# Patient Record
Sex: Male | Born: 1970
Health system: Southern US, Community
[De-identification: ages and names within clinical notes are randomized; demographics above are authoritative.]

## PROBLEM LIST (undated history)

## (undated) ENCOUNTER — Emergency Department (HOSPITAL_BASED_OUTPATIENT_CLINIC_OR_DEPARTMENT_OTHER): Admission: EM | Payer: Medicaid Other

## (undated) DIAGNOSIS — F419 Anxiety disorder, unspecified: Secondary | ICD-10-CM

## (undated) DIAGNOSIS — R911 Solitary pulmonary nodule: Secondary | ICD-10-CM

## (undated) DIAGNOSIS — N179 Acute kidney failure, unspecified: Secondary | ICD-10-CM

## (undated) DIAGNOSIS — J449 Chronic obstructive pulmonary disease, unspecified: Secondary | ICD-10-CM

## (undated) DIAGNOSIS — K209 Esophagitis, unspecified without bleeding: Secondary | ICD-10-CM

## (undated) DIAGNOSIS — I10 Essential (primary) hypertension: Secondary | ICD-10-CM

## (undated) DIAGNOSIS — G9389 Other specified disorders of brain: Secondary | ICD-10-CM

## (undated) DIAGNOSIS — S270XXA Traumatic pneumothorax, initial encounter: Secondary | ICD-10-CM

## (undated) DIAGNOSIS — C801 Malignant (primary) neoplasm, unspecified: Secondary | ICD-10-CM

## (undated) DIAGNOSIS — C7931 Secondary malignant neoplasm of brain: Secondary | ICD-10-CM

## (undated) DIAGNOSIS — C7951 Secondary malignant neoplasm of bone: Secondary | ICD-10-CM

## (undated) DIAGNOSIS — T148XXA Other injury of unspecified body region, initial encounter: Secondary | ICD-10-CM

## (undated) DIAGNOSIS — Z8051 Family history of malignant neoplasm of kidney: Secondary | ICD-10-CM

## (undated) DIAGNOSIS — Z808 Family history of malignant neoplasm of other organs or systems: Secondary | ICD-10-CM

## (undated) DIAGNOSIS — D649 Anemia, unspecified: Secondary | ICD-10-CM

## (undated) HISTORY — DX: Solitary pulmonary nodule: R91.1

## (undated) HISTORY — DX: Family history of malignant neoplasm of other organs or systems: Z80.8

## (undated) HISTORY — DX: Esophagitis, unspecified without bleeding: K20.90

## (undated) HISTORY — PX: SHOULDER SURGERY: SHX246

## (undated) HISTORY — DX: Malignant (primary) neoplasm, unspecified: C80.1

## (undated) HISTORY — DX: Family history of malignant neoplasm of kidney: Z80.51

## (undated) HISTORY — DX: Esophagitis, unspecified: K20.9

---

## 2016-05-15 DIAGNOSIS — Z8709 Personal history of other diseases of the respiratory system: Secondary | ICD-10-CM | POA: Insufficient documentation

## 2016-06-10 DIAGNOSIS — G9389 Other specified disorders of brain: Secondary | ICD-10-CM

## 2016-06-10 HISTORY — DX: Other specified disorders of brain: G93.89

## 2016-06-16 ENCOUNTER — Emergency Department (HOSPITAL_COMMUNITY): Payer: Medicaid Other

## 2016-06-16 ENCOUNTER — Encounter (HOSPITAL_COMMUNITY): Payer: Self-pay | Admitting: *Deleted

## 2016-06-16 ENCOUNTER — Inpatient Hospital Stay (HOSPITAL_COMMUNITY)
Admission: EM | Admit: 2016-06-16 | Discharge: 2016-06-22 | DRG: 166 | Disposition: A | Discharge status: Home / Self Care | Payer: Medicaid Other | Attending: Internal Medicine | Admitting: Internal Medicine

## 2016-06-16 DIAGNOSIS — R569 Unspecified convulsions: Secondary | ICD-10-CM

## 2016-06-16 DIAGNOSIS — W010XXA Fall on same level from slipping, tripping and stumbling without subsequent striking against object, initial encounter: Secondary | ICD-10-CM | POA: Diagnosis present

## 2016-06-16 DIAGNOSIS — F1210 Cannabis abuse, uncomplicated: Secondary | ICD-10-CM | POA: Diagnosis present

## 2016-06-16 DIAGNOSIS — F1490 Cocaine use, unspecified, uncomplicated: Secondary | ICD-10-CM | POA: Diagnosis present

## 2016-06-16 DIAGNOSIS — R911 Solitary pulmonary nodule: Principal | ICD-10-CM

## 2016-06-16 DIAGNOSIS — Z9889 Other specified postprocedural states: Secondary | ICD-10-CM

## 2016-06-16 DIAGNOSIS — R0602 Shortness of breath: Secondary | ICD-10-CM

## 2016-06-16 DIAGNOSIS — G9389 Other specified disorders of brain: Secondary | ICD-10-CM

## 2016-06-16 DIAGNOSIS — Z87891 Personal history of nicotine dependence: Secondary | ICD-10-CM

## 2016-06-16 DIAGNOSIS — R159 Full incontinence of feces: Secondary | ICD-10-CM | POA: Diagnosis present

## 2016-06-16 DIAGNOSIS — F1020 Alcohol dependence, uncomplicated: Secondary | ICD-10-CM | POA: Diagnosis present

## 2016-06-16 DIAGNOSIS — G936 Cerebral edema: Secondary | ICD-10-CM | POA: Diagnosis present

## 2016-06-16 DIAGNOSIS — Z8249 Family history of ischemic heart disease and other diseases of the circulatory system: Secondary | ICD-10-CM

## 2016-06-16 DIAGNOSIS — Z808 Family history of malignant neoplasm of other organs or systems: Secondary | ICD-10-CM

## 2016-06-16 DIAGNOSIS — D496 Neoplasm of unspecified behavior of brain: Secondary | ICD-10-CM

## 2016-06-16 DIAGNOSIS — Z419 Encounter for procedure for purposes other than remedying health state, unspecified: Secondary | ICD-10-CM

## 2016-06-16 HISTORY — DX: Other specified disorders of brain: G93.89

## 2016-06-16 HISTORY — DX: Solitary pulmonary nodule: R91.1

## 2016-06-16 HISTORY — DX: Traumatic pneumothorax, initial encounter: S27.0XXA

## 2016-06-16 HISTORY — DX: Other injury of unspecified body region, initial encounter: T14.8XXA

## 2016-06-16 LAB — RAPID URINE DRUG SCREEN, HOSP PERFORMED
Amphetamines: NOT DETECTED
BENZODIAZEPINES: NOT DETECTED
Barbiturates: NOT DETECTED
Cocaine: POSITIVE — AB
Opiates: NOT DETECTED
Tetrahydrocannabinol: POSITIVE — AB

## 2016-06-16 LAB — COMPREHENSIVE METABOLIC PANEL
ALT: 20 U/L (ref 17–63)
ANION GAP: 9 (ref 5–15)
AST: 48 U/L — ABNORMAL HIGH (ref 15–41)
Albumin: 4.3 g/dL (ref 3.5–5.0)
Alkaline Phosphatase: 49 U/L (ref 38–126)
BUN: 9 mg/dL (ref 6–20)
CALCIUM: 9.3 mg/dL (ref 8.9–10.3)
CHLORIDE: 103 mmol/L (ref 101–111)
CO2: 24 mmol/L (ref 22–32)
CREATININE: 0.8 mg/dL (ref 0.61–1.24)
Glucose, Bld: 85 mg/dL (ref 65–99)
Potassium: 3.6 mmol/L (ref 3.5–5.1)
SODIUM: 136 mmol/L (ref 135–145)
Total Bilirubin: 1.7 mg/dL — ABNORMAL HIGH (ref 0.3–1.2)
Total Protein: 8.3 g/dL — ABNORMAL HIGH (ref 6.5–8.1)

## 2016-06-16 LAB — CBC WITH DIFFERENTIAL/PLATELET
BASOS ABS: 0 10*3/uL (ref 0.0–0.1)
Basophils Relative: 0 %
EOS ABS: 0 10*3/uL (ref 0.0–0.7)
Eosinophils Relative: 0 %
HCT: 39.3 % (ref 39.0–52.0)
Hemoglobin: 13.6 g/dL (ref 13.0–17.0)
LYMPHS ABS: 1.5 10*3/uL (ref 0.7–4.0)
Lymphocytes Relative: 14 %
MCH: 30 pg (ref 26.0–34.0)
MCHC: 34.6 g/dL (ref 30.0–36.0)
MCV: 86.8 fL (ref 78.0–100.0)
Monocytes Absolute: 0.7 10*3/uL (ref 0.1–1.0)
Monocytes Relative: 6 %
NEUTROS ABS: 8.2 10*3/uL — AB (ref 1.7–7.7)
NEUTROS PCT: 80 %
PLATELETS: 190 10*3/uL (ref 150–400)
RBC: 4.53 MIL/uL (ref 4.22–5.81)
RDW: 13 % (ref 11.5–15.5)
WBC: 10.3 10*3/uL (ref 4.0–10.5)

## 2016-06-16 MED ORDER — GADOBENATE DIMEGLUMINE 529 MG/ML IV SOLN
10.0000 mL | Freq: Once | INTRAVENOUS | Status: AC | PRN
  Administered 2016-06-16: 10 mL via INTRAVENOUS

## 2016-06-16 MED ORDER — DEXAMETHASONE SODIUM PHOSPHATE 10 MG/ML IJ SOLN
10.0000 mg | Freq: Once | INTRAMUSCULAR | Status: AC
  Administered 2016-06-16: 10 mg via INTRAVENOUS
  Filled 2016-06-16: qty 1

## 2016-06-16 MED ORDER — SODIUM CHLORIDE 0.9 % IV SOLN
1000.0000 mg | Freq: Once | INTRAVENOUS | Status: AC
  Administered 2016-06-16: 1000 mg via INTRAVENOUS
  Filled 2016-06-16: qty 10

## 2016-06-16 NOTE — ED Triage Notes (Signed)
Pt boss states the pt lost consciousness while at work today, falling forward onto his face. Pt boss states the pt appeared to be dazed and confused before losing consciousness. Pt was confused upon regaining consciousness. Pt is now alert to person and place but not time.

## 2016-06-16 NOTE — ED Provider Notes (Signed)
[facility] DEPT Provider Note   CSN: [id number] Arrival date & time: 06/16/16  1754     History   Chief Complaint Chief Complaint  Patient presents with  . Loss of Consciousness  . Head Injury   Level 05 caveat altered mental status HPI Eric Mason is a 46 y.o. male.History is obtained from patient's cousin who witnessed the event. Patient's cousin reports that approximately 3 PM today she found him with unsteady gait. He had also spoken with his wife at up approximately 2:50 PM today stating that he had a funny taste in his mouth. He had fallen into the bushes.. And got up with unsteady gait she noticed him to be foaming at the mouth. He had become incontinent of stool. He bumped his head as a result of the fall. She has no recall of the event. After approximately 30 minutes patient was acting normally and gradually became more alert and appropriate. Brought here by the EMS. Presently asymptomatic.  HPI  History reviewed. No pertinent past medical history.  There are no active problems to display for this patient.   History reviewed. No pertinent surgical history.     Home Medications    Prior to Admission medications   Not on File    Family History No family history on file.  Social History Social History  Substance Use Topics  . Smoking status: Current Every Day Smoker  . Smokeless tobacco: Never Used  . Alcohol use Yes  DRINKS 40 OUNCES beer daily. Admits to cocaine use last time 5 days ago. No IV drug use    Allergies   Patient has no allergy information on record.   Review of Systems Review of Systems  Unable to perform ROS: Mental status change  Respiratory: Negative.   Cardiovascular: Negative for chest pain.       Syncope  Musculoskeletal: Negative.   Skin: Positive for wound.       Abrasion to forehead  Allergic/Immunologic: Negative.   Neurological: Negative.   Psychiatric/Behavioral: Positive for confusion.     Physical  Exam Updated Vital Signs BP (!) 155/112 (BP Location: Right Arm)   Pulse 71   Temp 97.7 F (36.5 C) (Oral)   Resp 18   SpO2 99%   Physical Exam  Constitutional: He is oriented to person, place, and time. He appears well-developed and well-nourished.  HENT:  Head: Normocephalic and atraumatic.  Dime-sized abrasion at Center of for head otherwise atraumatic  Eyes: Conjunctivae are normal. Pupils are equal, round, and reactive to light.  Neck: Neck supple. No tracheal deviation present. No thyromegaly present.  Cardiovascular: Normal rate and regular rhythm.   No murmur heard. Pulmonary/Chest: Effort normal and breath sounds normal.  Abdominal: Soft. Bowel sounds are normal. He exhibits no distension. There is no tenderness.  Musculoskeletal: Normal range of motion. He exhibits no edema or tenderness.  Neurological: He is alert and oriented to person, place, and time. Coordination normal.  Oriented place person and time. Gait normal Romberg normal pronator drift normal DTR symmetric bilaterally at knee jerk ankle jerk and biceps was ordered bilaterally  Skin: Skin is warm and dry. No rash noted.  Psychiatric: He has a normal mood and affect.  Nursing note and vitals reviewed.    ED Treatments / Results  Labs (all labs ordered are listed, but only abnormal results are displayed) Labs Reviewed  COMPREHENSIVE METABOLIC PANEL  CBC WITH DIFFERENTIAL/PLATELET  RAPID URINE DRUG SCREEN, HOSP PERFORMED    EKG  EKG Interpretation  Date/Time:  Saturday June 16 2016 18:10:14 EDT Ventricular Rate:  66 PR Interval:    QRS Duration: 79 QT Interval:  444 QTC Calculation: 466 R Axis:   84 Text Interpretation:  Sinus rhythm Probable left atrial enlargement Left ventricular hypertrophy Abnrm T, consider ischemia, anterolateral lds ST elev, probable normal early repol pattern No old tracing to compare Confirmed by Winfred Leeds  MD, Ariel Dimitri [phone]) on 06/16/2016 6:27:17 PM       Radiology No  results found.  Procedures Procedures (including critical care time)  Medications Ordered in ED Medications - No data to display  Results for orders placed or performed during the hospital encounter of 06/16/16  Comprehensive metabolic panel  Result Value Ref Range   Sodium 136 135 - 145 mmol/L   Potassium 3.6 3.5 - 5.1 mmol/L   Chloride 103 101 - 111 mmol/L   CO2 24 22 - 32 mmol/L   Glucose, Bld 85 65 - 99 mg/dL   BUN 9 6 - 20 mg/dL   Creatinine, Ser 0.80 0.61 - 1.24 mg/dL   Calcium 9.3 8.9 - 10.3 mg/dL   Total Protein 8.3 (H) 6.5 - 8.1 g/dL   Albumin 4.3 3.5 - 5.0 g/dL   AST 48 (H) 15 - 41 U/L   ALT 20 17 - 63 U/L   Alkaline Phosphatase 49 38 - 126 U/L   Total Bilirubin 1.7 (H) 0.3 - 1.2 mg/dL   GFR calc non Af Amer >60 >60 mL/min   GFR calc Af Amer >60 >60 mL/min   Anion gap 9 5 - 15  CBC with Differential/Platelet  Result Value Ref Range   WBC 10.3 4.0 - 10.5 K/uL   RBC 4.53 4.22 - 5.81 MIL/uL   Hemoglobin 13.6 13.0 - 17.0 g/dL   HCT 39.3 39.0 - 52.0 %   MCV 86.8 78.0 - 100.0 fL   MCH 30.0 26.0 - 34.0 pg   MCHC 34.6 30.0 - 36.0 g/dL   RDW 13.0 11.5 - 15.5 %   Platelets 190 150 - 400 K/uL   Neutrophils Relative % 80 %   Neutro Abs 8.2 (H) 1.7 - 7.7 K/uL   Lymphocytes Relative 14 %   Lymphs Abs 1.5 0.7 - 4.0 K/uL   Monocytes Relative 6 %   Monocytes Absolute 0.7 0.1 - 1.0 K/uL   Eosinophils Relative 0 %   Eosinophils Absolute 0.0 0.0 - 0.7 K/uL   Basophils Relative 0 %   Basophils Absolute 0.0 0.0 - 0.1 K/uL  Rapid urine drug screen (hospital performed)  Result Value Ref Range   Opiates NONE DETECTED NONE DETECTED   Cocaine POSITIVE (A) NONE DETECTED   Benzodiazepines NONE DETECTED NONE DETECTED   Amphetamines NONE DETECTED NONE DETECTED   Tetrahydrocannabinol POSITIVE (A) NONE DETECTED   Barbiturates NONE DETECTED NONE DETECTED   Dg Chest 2 View  Result Date: 06/16/2016 CLINICAL DATA:  Loss of consciousness, confusion EXAM: CHEST  2 VIEW COMPARISON:   None. FINDINGS: Lungs are clear. Lucency along the medial right upper hemithorax may reflect a prominent lung cyst/bleb. No pleural effusion or pneumothorax. The heart is normal size. Visualized osseous structures are within normal limits. IMPRESSION: No evidence of acute cardiopulmonary disease. Electronically Signed   By: Julian Hy M.D.   On: 06/16/2016 20:30   Ct Head Wo Contrast  Result Date: 06/16/2016 CLINICAL DATA:  Syncopal episode today falling forward onto face. EXAM: CT HEAD WITHOUT CONTRAST TECHNIQUE: Contiguous axial images were obtained from the base of the  skull through the vertex without intravenous contrast. COMPARISON:  None. FINDINGS: Brain: The ventricles, cisterns and other CSF spaces are normal. There is an oval slightly hyperdense mass over the subcortical white matter of the posterior right temporal region measuring approximately 9 x 9 x 10 mm. This mass has slight central decreased attenuation. No adjacent mass effect or significant edema. No evidence of midline shift. No evidence of acute infarction. Vascular: Within normal. Skull: Within normal. Sinuses/Orbits: Within normal. Other: None. IMPRESSION: Oval hyperdensity over the posterior right temporal subcortical region measuring 9 x 9 x 10 mm. Although this may represent acute parenchymal hemorrhage, the appearance is more typical of a mass which may be of infectious or neoplastic origin. Recommend MRI of the brain with and without contrast for further evaluation. These results were called by telephone at the time of interpretation on 06/16/2016 at 7:50 pm to Dr. Orlie Dakin , who verbally acknowledged these results. Electronically Signed   By: Marin Olp M.D.   On: 06/16/2016 19:46   Mr Jeri Cos And Wo Contrast  Result Date: 06/16/2016 CLINICAL DATA:  46 y/o M; syncopal episode with fall. Abnormal CT of head. EXAM: MRI HEAD WITHOUT AND WITH CONTRAST TECHNIQUE: Multiplanar, multiecho pulse sequences of the brain and  surrounding structures were obtained without and with intravenous contrast. CONTRAST:  38m MULTIHANCE GADOBENATE DIMEGLUMINE 529 MG/ML IV SOLN COMPARISON:  06/16/2016 CT of the head. FINDINGS: Brain: Centered within the right posterior temporal lobe cortex/subcortical region there is a mass measuring 12 x 12 x 10 mm (AP x ML x CC series 13: Image 8, 14:6, 3:6). On delayed coronal and sagittal postcontrast sequences there is a solid heterogeneous enhancement. The mass demonstrates intermediate T1 and low T2 signal. On gradient echo imaging there is mild blooming indicating internal hemorrhage. There is a small region of surrounding T2 FLAIR hyperintense signal abnormality in the brain compatible with edema with mild local mass effect. No diffusion restriction to suggest acute or early subacute infarction. No additional focus of abnormal susceptibility hypointensity to indicate intracranial hemorrhage. No midline shift or herniation. No extra-axial collection. No effacement of basilar cisterns. Vascular: Normal flow voids. Skull and upper cervical spine: Normal marrow signal. Sinuses/Orbits: Mild diffuse paranasal sinus mucosal thickening. No abnormal signal of mastoid air cells. Orbits are unremarkable. Other: The left frontal scalp thickening may represent a small contusion. IMPRESSION: Heterogeneously enhancing mass centered in the right posterior temporal lobe cortical/subcortical region measuring up to 12 mm. Small region of surrounding edema and local mass effect. Mild susceptibility blooming indicates probable internal hemorrhage. 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 Given solid enhancement on delayed imaging, an infectious process is considered unlikely. No specific findings of demyelination. Electronically Signed   By: LKristine GarbeM.D.   On: 06/16/2016 22:49   Initial Impression / Assessment and Plan / ED Course  I have reviewed the  triage vital signs and the nursing notes.  Pertinent labs & imaging results that were available during my care of the patient were reviewed by me and considered in my medical decision making (see chart for details).     12:10 PM patient resting comfortably. Alert Glasgow Coma Score 15. Offers no complaint. Decadron 10 mg IV ordered by me for edema around tumor. He was also loaded intravenously with Keppra 1000 g IV to prevent further seizures [facility]. Physician's assistant suggest Decadron 470m IV every 6 hours. [facility]. Dr. Cyndy Freeze from neurosurgical service will see patient tomorrow. I consulted Dr. Eulas Post from hospital service who will make arrangements for transfer.  Diagnosis  Final diagnoses:  None   diagnosis #1 new onset seizure   #2 brain tumor with edema #3 substance abuse CRITICAL CARE Performed by: Orlie Dakin Total critical care time: 30 minutes Critical care time was exclusive of separately billable procedures and treating other patients. Critical care was necessary to treat or prevent imminent or life-threatening deterioration. Critical care was time spent personally by me on the following activities: development of treatment plan with patient and/or surrogate as well as nursing, discussions with consultants, evaluation of patient's response to treatment, examination of patient, obtaining history from patient or surrogate, ordering and performing treatments and interventions, ordering and review of laboratory studies, ordering and review of radiographic studies, pulse oximetry and re-evaluation of patient's condition. New Prescriptions New Prescriptions   No medications on file     Orlie Dakin, MD 06/17/16 [id number]

## 2016-06-17 ENCOUNTER — Encounter (HOSPITAL_COMMUNITY): Payer: Self-pay | Admitting: Internal Medicine

## 2016-06-17 ENCOUNTER — Inpatient Hospital Stay (HOSPITAL_COMMUNITY): Payer: Medicaid Other

## 2016-06-17 DIAGNOSIS — F1490 Cocaine use, unspecified, uncomplicated: Secondary | ICD-10-CM | POA: Diagnosis present

## 2016-06-17 DIAGNOSIS — F1020 Alcohol dependence, uncomplicated: Secondary | ICD-10-CM | POA: Diagnosis present

## 2016-06-17 DIAGNOSIS — R0602 Shortness of breath: Secondary | ICD-10-CM

## 2016-06-17 DIAGNOSIS — G936 Cerebral edema: Secondary | ICD-10-CM | POA: Diagnosis present

## 2016-06-17 DIAGNOSIS — F1210 Cannabis abuse, uncomplicated: Secondary | ICD-10-CM | POA: Diagnosis present

## 2016-06-17 DIAGNOSIS — Z720 Tobacco use: Secondary | ICD-10-CM

## 2016-06-17 DIAGNOSIS — R569 Unspecified convulsions: Secondary | ICD-10-CM | POA: Diagnosis present

## 2016-06-17 DIAGNOSIS — W010XXA Fall on same level from slipping, tripping and stumbling without subsequent striking against object, initial encounter: Secondary | ICD-10-CM | POA: Diagnosis present

## 2016-06-17 DIAGNOSIS — Z87891 Personal history of nicotine dependence: Secondary | ICD-10-CM | POA: Diagnosis not present

## 2016-06-17 DIAGNOSIS — R159 Full incontinence of feces: Secondary | ICD-10-CM | POA: Diagnosis present

## 2016-06-17 DIAGNOSIS — R911 Solitary pulmonary nodule: Secondary | ICD-10-CM | POA: Diagnosis present

## 2016-06-17 DIAGNOSIS — Z808 Family history of malignant neoplasm of other organs or systems: Secondary | ICD-10-CM | POA: Diagnosis not present

## 2016-06-17 DIAGNOSIS — F10230 Alcohol dependence with withdrawal, uncomplicated: Secondary | ICD-10-CM

## 2016-06-17 DIAGNOSIS — G9389 Other specified disorders of brain: Secondary | ICD-10-CM | POA: Diagnosis present

## 2016-06-17 DIAGNOSIS — Z8249 Family history of ischemic heart disease and other diseases of the circulatory system: Secondary | ICD-10-CM | POA: Diagnosis not present

## 2016-06-17 LAB — URINALYSIS, ROUTINE W REFLEX MICROSCOPIC
BACTERIA UA: NONE SEEN
BILIRUBIN URINE: NEGATIVE
Glucose, UA: NEGATIVE mg/dL
Ketones, ur: NEGATIVE mg/dL
Leukocytes, UA: NEGATIVE
NITRITE: NEGATIVE
Protein, ur: 30 mg/dL — AB
Specific Gravity, Urine: 1.014 (ref 1.005–1.030)
Squamous Epithelial / LPF: NONE SEEN
pH: 6 (ref 5.0–8.0)

## 2016-06-17 LAB — APTT: aPTT: 27 seconds (ref 24–36)

## 2016-06-17 LAB — PROTIME-INR
INR: 0.93
Prothrombin Time: 12.4 seconds (ref 11.4–15.2)

## 2016-06-17 LAB — HIV ANTIBODY (ROUTINE TESTING W REFLEX): HIV SCREEN 4TH GENERATION: NONREACTIVE

## 2016-06-17 LAB — MRSA PCR SCREENING: MRSA by PCR: NEGATIVE

## 2016-06-17 MED ORDER — ACETAMINOPHEN 325 MG PO TABS: 650.0000 mg | ORAL_TABLET | Freq: Four times a day (QID) | ORAL | Status: DC | PRN

## 2016-06-17 MED ORDER — FOLIC ACID 1 MG PO TABS
1.0000 mg | ORAL_TABLET | Freq: Every day | ORAL | Status: DC
  Administered 2016-06-17 – 2016-06-22 (×6): 1 mg via ORAL
  Filled 2016-06-17 (×6): qty 1

## 2016-06-17 MED ORDER — SODIUM CHLORIDE 0.9 % IV SOLN
INTRAVENOUS | Status: DC
Start: 2016-06-17 — End: 2016-06-20
  Administered 2016-06-17 – 2016-06-20 (×4): via INTRAVENOUS

## 2016-06-17 MED ORDER — DEXAMETHASONE SODIUM PHOSPHATE 4 MG/ML IJ SOLN
4.0000 mg | Freq: Four times a day (QID) | INTRAMUSCULAR | Status: DC
  Administered 2016-06-17 – 2016-06-20 (×12): 4 mg via INTRAVENOUS
  Filled 2016-06-17 (×13): qty 1

## 2016-06-17 MED ORDER — DEXAMETHASONE SODIUM PHOSPHATE 10 MG/ML IJ SOLN
4.0000 mg | Freq: Four times a day (QID) | INTRAMUSCULAR | Status: DC
  Administered 2016-06-17: 4 mg via INTRAVENOUS
  Filled 2016-06-17: qty 0.4
  Filled 2016-06-17: qty 1

## 2016-06-17 MED ORDER — ACETAMINOPHEN 650 MG RE SUPP: 650.0000 mg | Freq: Four times a day (QID) | RECTAL | Status: DC | PRN

## 2016-06-17 MED ORDER — NICOTINE 21 MG/24HR TD PT24
21.0000 mg | MEDICATED_PATCH | Freq: Every day | TRANSDERMAL | Status: DC
  Administered 2016-06-17 – 2016-06-22 (×6): 21 mg via TRANSDERMAL
  Filled 2016-06-17 (×6): qty 1

## 2016-06-17 MED ORDER — IOPAMIDOL (ISOVUE-300) INJECTION 61%
100.0000 mL | Freq: Once | INTRAVENOUS | Status: AC | PRN
  Administered 2016-06-17: 100 mL via INTRAVENOUS

## 2016-06-17 MED ORDER — ONDANSETRON HCL 4 MG/2ML IJ SOLN: 4.0000 mg | Freq: Four times a day (QID) | INTRAMUSCULAR | Status: DC | PRN

## 2016-06-17 MED ORDER — ADULT MULTIVITAMIN W/MINERALS CH
1.0000 | ORAL_TABLET | Freq: Every day | ORAL | Status: DC
  Administered 2016-06-17 – 2016-06-22 (×6): 1 via ORAL
  Filled 2016-06-17 (×6): qty 1

## 2016-06-17 MED ORDER — THIAMINE HCL 100 MG/ML IJ SOLN
100.0000 mg | Freq: Every day | INTRAMUSCULAR | Status: DC
  Administered 2016-06-20: 100 mg via INTRAVENOUS
  Filled 2016-06-17 (×2): qty 2

## 2016-06-17 MED ORDER — SODIUM CHLORIDE 0.9% FLUSH
3.0000 mL | Freq: Two times a day (BID) | INTRAVENOUS | Status: DC
  Administered 2016-06-17 – 2016-06-22 (×7): 3 mL via INTRAVENOUS

## 2016-06-17 MED ORDER — LORAZEPAM 1 MG PO TABS: 1.0000 mg | ORAL_TABLET | Freq: Four times a day (QID) | ORAL | Status: DC | PRN

## 2016-06-17 MED ORDER — VITAMIN B-1 100 MG PO TABS
100.0000 mg | ORAL_TABLET | Freq: Every day | ORAL | Status: DC
  Administered 2016-06-17 – 2016-06-22 (×5): 100 mg via ORAL
  Filled 2016-06-17 (×6): qty 1

## 2016-06-17 MED ORDER — SODIUM CHLORIDE 0.9 % IV SOLN
500.0000 mg | Freq: Two times a day (BID) | INTRAVENOUS | Status: DC
  Administered 2016-06-17 – 2016-06-20 (×7): 500 mg via INTRAVENOUS
  Filled 2016-06-17 (×11): qty 5

## 2016-06-17 MED ORDER — ONDANSETRON HCL 4 MG PO TABS: 4.0000 mg | ORAL_TABLET | Freq: Four times a day (QID) | ORAL | Status: DC | PRN

## 2016-06-17 MED ORDER — LORAZEPAM 2 MG/ML IJ SOLN
1.0000 mg | Freq: Four times a day (QID) | INTRAMUSCULAR | Status: DC | PRN
  Filled 2016-06-17: qty 1

## 2016-06-17 MED ORDER — IOPAMIDOL (ISOVUE-300) INJECTION 61%
INTRAVENOUS | Status: AC
  Filled 2016-06-17: qty 100

## 2016-06-17 MED ORDER — TRAMADOL HCL 50 MG PO TABS
50.0000 mg | ORAL_TABLET | Freq: Four times a day (QID) | ORAL | Status: DC | PRN
  Administered 2016-06-18 (×2): 50 mg via ORAL
  Filled 2016-06-17 (×2): qty 1

## 2016-06-17 MED ORDER — MORPHINE SULFATE (PF) 2 MG/ML IV SOLN
2.0000 mg | INTRAVENOUS | Status: DC | PRN
  Administered 2016-06-17 – 2016-06-22 (×8): 2 mg via INTRAVENOUS
  Filled 2016-06-17 (×8): qty 1
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
## 2016-06-17 NOTE — Progress Notes (Signed)
Patient's transfer here from [facility] and seen and examined by me. He was admitted for altered mental status and unsteady gait with concerns of seizures. CT of the head and MRI found right-sided posterior temporal mass measuring 12 mm. Due to history of alcohol abuse he was also placed on CIWA protocol and started on Keppra. Neurosurgery and neurology were consult in. Currently is on seizure precaution, dexamethasone, Keppra. Currently does not any complaints and is stable at this time. He tells me he used to smoke 1 pack of cigarettes daily and drinks about 2-5 beers every day for several years. His last drink was 2 days ago. CT of the chest abdomen and pelvis is done which shows FSI medicine blebs and bulla with 11 mm posterior segment right upper lobe nodule otherwise no other suspicious finding in abdomen and pelvis to explain brain lesions.  We will continue to monitor him here. I have answered all patient's questions. Please call with further questions if necessary.

## 2016-06-17 NOTE — Progress Notes (Signed)
Patient seen and examined. Admitted after midnight secondary to AMS and unsteady gait. oriented and stable now. Work up and exam on admission suggested seizure activity (tongue bitten, stool incontinence and post ictal state after event). CT head and MRI found concerning heterogeneously enhancing mass of the right posterior temporal lobe measuring 2m, with probable mild internal hemorrhage.  Metastatic disease favored. Neurology was consulted and [facility] Please refer to H&P written by Dr. CEulas Postfor further info/details on admission.  Plan: 1. Continue dexamethasone at 4 mg IV QID. 2. Continue Keppra at 500 mg IV or PO BID. 3. Seizure precautions.  4. Neurosurgery consultation for possible biopsy.  5. EEG.  6. Patient will be kept on CIWA protocol. 7. Continue supportive care  MBarton [phone]

## 2016-06-17 NOTE — ED Notes (Signed)
Hospitalist at bedside 

## 2016-06-17 NOTE — ED Notes (Signed)
Report given to [place]

## 2016-06-17 NOTE — ED Notes (Signed)
Dr. Cheral Marker, neurologist, at bedside.

## 2016-06-17 NOTE — ED Notes (Signed)
carelink notified of bed placed
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
## 2016-06-18 ENCOUNTER — Other Ambulatory Visit: Payer: Self-pay | Admitting: Emergency Medicine

## 2016-06-18 ENCOUNTER — Telehealth: Payer: Self-pay | Admitting: Emergency Medicine

## 2016-06-18 DIAGNOSIS — R918 Other nonspecific abnormal finding of lung field: Secondary | ICD-10-CM

## 2016-06-18 DIAGNOSIS — D496 Neoplasm of unspecified behavior of brain: Secondary | ICD-10-CM

## 2016-06-18 DIAGNOSIS — R911 Solitary pulmonary nodule: Secondary | ICD-10-CM
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
## 2016-06-18 NOTE — Progress Notes (Signed)
Set up ENB, RLL nodule.

## 2016-06-18 NOTE — Progress Notes (Signed)
Patient reports someone from Pulmonology saw him this morning and said they would be able to perform a biopsy.  Depending on the results of that we will either plan stereotactic radiosurgery alone or pre-op SRS followed by craniotomy and resection.  I'll make RadOnc aware.

## 2016-06-18 NOTE — Care Management Note (Signed)
Case Management Note  Patient Details  Name: Eric Mason MRN: [id number] Date of Birth: May 11, 1970  Subjective/Objective: Pt admitted with lung mass                   Action/Plan:   PTA independent from home with wife.  Planned tissue sampling biopsy 4/11, on IV keppra.  Pt would like to establish care at [name].  CM provided pt brochure and informed to contact at discharge to request hospital follow up appt.  Pt may need MATCH at discharge.  CM will continue to follow for discharge needs   Expected Discharge Date:                  Expected Discharge Plan:     In-House Referral:     Discharge planning Services  CM Consult  Post Acute Care Choice:    Choice offered to:     DME Arranged:    DME Agency:     HH Arranged:    HH Agency:     Status of Service:  In process, will continue to follow  If discussed at Long Length of Stay Meetings, dates discussed:    Additional Comments:  Maryclare Labrador, RN 06/18/2016, 2:44 PM
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
## 2016-06-18 NOTE — Telephone Encounter (Signed)
Attempted to contact Radiology but phone lines just rung, unable to leave a message. Will hold for triage tomorrow.

## 2016-06-19 ENCOUNTER — Inpatient Hospital Stay (HOSPITAL_COMMUNITY): Payer: Medicaid Other

## 2016-06-19 DIAGNOSIS — R569 Unspecified convulsions: Secondary | ICD-10-CM

## 2016-06-19 DIAGNOSIS — G939 Disorder of brain, unspecified: Secondary | ICD-10-CM

## 2016-06-19 DIAGNOSIS — R911 Solitary pulmonary nodule: Secondary | ICD-10-CM | POA: Diagnosis present

## 2016-06-19 LAB — COMPREHENSIVE METABOLIC PANEL
ALT: 26 U/L (ref 17–63)
ANION GAP: 7 (ref 5–15)
AST: 68 U/L — ABNORMAL HIGH (ref 15–41)
Albumin: 3.2 g/dL — ABNORMAL LOW (ref 3.5–5.0)
Alkaline Phosphatase: 43 U/L (ref 38–126)
BILIRUBIN TOTAL: 0.5 mg/dL (ref 0.3–1.2)
CO2: 23 mmol/L (ref 22–32)
Calcium: 8.7 mg/dL — ABNORMAL LOW (ref 8.9–10.3)
Chloride: 106 mmol/L (ref 101–111)
Creatinine, Ser: 0.72 mg/dL (ref 0.61–1.24)
Glucose, Bld: 128 mg/dL — ABNORMAL HIGH (ref 65–99)
POTASSIUM: 3.6 mmol/L (ref 3.5–5.1)
Sodium: 136 mmol/L (ref 135–145)
TOTAL PROTEIN: 6.4 g/dL — AB (ref 6.5–8.1)

## 2016-06-19 LAB — CBC
HEMATOCRIT: 33.5 % — AB (ref 39.0–52.0)
HEMOGLOBIN: 11.3 g/dL — AB (ref 13.0–17.0)
MCH: 29.7 pg (ref 26.0–34.0)
MCHC: 33.7 g/dL (ref 30.0–36.0)
MCV: 87.9 fL (ref 78.0–100.0)
Platelets: 163 10*3/uL (ref 150–400)
RBC: 3.81 MIL/uL — ABNORMAL LOW (ref 4.22–5.81)
RDW: 13.2 % (ref 11.5–15.5)
WBC: 10.9 10*3/uL — AB (ref 4.0–10.5)

## 2016-06-19 NOTE — Telephone Encounter (Signed)
Called and spoke with Claiborne Billings at [facility] and she stated that the pt will be scanned today to get the cuts that RB needs.  Will send to [place] as an FYI. Nothing furhter is needed.

## 2016-06-19 NOTE — Progress Notes (Signed)
Transported to procedure room for EEG by bed.

## 2016-06-19 NOTE — Progress Notes (Signed)
Pt seen and examined.  No issues overnight.  EXAM: Temp:  [97.6 F (36.4 C)-98.6 F (37 C)] 97.6 F (36.4 C) (04/10 1100) Pulse Rate:  [50-70] 55 (04/10 1100) Resp:  [12-26] 14 (04/10 1100) BP: (122-158)/(65-95) 122/88 (04/10 1100) SpO2:  [91 %-98 %] 91 % (04/10 1100) Intake/Output      04/09 0701 - 04/10 0700 04/10 0701 - 04/11 0700   P.O. 480 120   I.V. 1362.5 250   IV Piggyback 210    Total Intake 2052.5 370   Urine 2355 500   Stool 0    Total Output 2355 500   Net -302.5 -130        Urine Occurrence 1 x    Stool Occurrence 2 x     Awake and alert Follows commands throughout Full strength  Stable Continue current care
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
## 2016-06-19 NOTE — Progress Notes (Signed)
EEG completed, results pending. 
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
## 2016-06-19 NOTE — Procedures (Signed)
ELECTROENCEPHALOGRAM REPORT  Date of Study: 06/19/2016  Patient's Name: Eric Mason MRN: [id number] Date of Birth: 1970-12-26  Referring Provider: Etta Quill, PA-C  Clinical History: This is a 46 year old man with altered mental status, possible seizure, with right temporal lobe mass.  Medications: levETIRAcetam (KEPPRA) 500 mg in sodium chloride 0.9 % 100 mL IVPB  acetaminophen (TYLENOL) tablet 650 mg  dexamethasone (DECADRON) injection 4 mg  folic acid (FOLVITE) tablet 1 mg  morphine 2 MG/ML injection 2 mg  multivitamin with minerals tablet 1 tablet  nicotine (NICODERM CQ - dosed in mg/24 hours) patch 21 mg  thiamine (VITAMIN B-1) tablet 100 mg   Technical Summary: A multichannel digital EEG recording measured by the international 10-20 system with electrodes applied with paste and impedances below 5000 ohms performed in our laboratory with EKG monitoring in an awake and asleep patient.  Hyperventilation and photic stimulation were not performed.  The digital EEG was referentially recorded, reformatted, and digitally filtered in a variety of bipolar and referential montages for optimal display.    Description: The patient is awake and asleep during the recording.  During maximal wakefulness, there is a symmetric, medium voltage 9 Hz posterior dominant rhythm that attenuates with eye opening.  The record is symmetric.  During drowsiness and sleep, there is an increase in theta slowing of the background.  Vertex waves and symmetric sleep spindles were seen.  Hyperventilation and photic stimulation were not performed.  There were no epileptiform discharges or electrographic seizures seen.    EKG lead showed sinus bradycardia.  Impression: This awake and asleep EEG is normal.    Clinical Correlation: A normal EEG does not exclude a clinical diagnosis of epilepsy.  Clinical correlation is advised.   Ellouise Newer, M.D.

## 2016-06-20 ENCOUNTER — Inpatient Hospital Stay (HOSPITAL_COMMUNITY): Payer: Medicaid Other | Admitting: Certified Registered Nurse Anesthetist

## 2016-06-20 ENCOUNTER — Inpatient Hospital Stay (HOSPITAL_COMMUNITY): Payer: Medicaid Other

## 2016-06-20 ENCOUNTER — Encounter (HOSPITAL_COMMUNITY)
Admission: EM | Disposition: A | Discharge status: Home / Self Care | Payer: Self-pay | Source: Home / Self Care | Attending: Internal Medicine

## 2016-06-20 ENCOUNTER — Encounter (HOSPITAL_COMMUNITY): Payer: Self-pay | Admitting: Certified Registered Nurse Anesthetist

## 2016-06-20 HISTORY — PX: VIDEO BRONCHOSCOPY WITH ENDOBRONCHIAL NAVIGATION: SHX6175

## 2016-06-20 SURGERY — VIDEO BRONCHOSCOPY WITH ENDOBRONCHIAL NAVIGATION
Anesthesia: General

## 2016-06-20 MED ORDER — SUGAMMADEX SODIUM 200 MG/2ML IV SOLN
INTRAVENOUS | Status: DC | PRN
  Administered 2016-06-20: 200 mg via INTRAVENOUS

## 2016-06-20 MED ORDER — ONDANSETRON HCL 4 MG/2ML IJ SOLN
INTRAMUSCULAR | Status: AC
  Filled 2016-06-20: qty 2

## 2016-06-20 MED ORDER — DEXAMETHASONE 4 MG PO TABS
4.0000 mg | ORAL_TABLET | Freq: Four times a day (QID) | ORAL | Status: DC
  Administered 2016-06-20 – 2016-06-22 (×8): 4 mg via ORAL
  Filled 2016-06-20 (×10): qty 1

## 2016-06-20 MED ORDER — DEXAMETHASONE SODIUM PHOSPHATE 10 MG/ML IJ SOLN
INTRAMUSCULAR | Status: DC | PRN
  Administered 2016-06-20: 5 mg via INTRAVENOUS

## 2016-06-20 MED ORDER — PHENYLEPHRINE HCL 10 MG/ML IJ SOLN
INTRAVENOUS | Status: DC | PRN
  Administered 2016-06-20: 50 ug/min via INTRAVENOUS

## 2016-06-20 MED ORDER — FENTANYL CITRATE (PF) 100 MCG/2ML IJ SOLN
INTRAMUSCULAR | Status: DC | PRN
  Administered 2016-06-20: 50 ug via INTRAVENOUS
  Administered 2016-06-20: 100 ug via INTRAVENOUS
  Administered 2016-06-20: 50 ug via INTRAVENOUS
  Administered 2016-06-20: 25 ug via INTRAVENOUS

## 2016-06-20 MED ORDER — PROPOFOL 10 MG/ML IV BOLUS
INTRAVENOUS | Status: DC | PRN
  Administered 2016-06-20: 50 mg via INTRAVENOUS
  Administered 2016-06-20: 100 mg via INTRAVENOUS

## 2016-06-20 MED ORDER — FENTANYL CITRATE (PF) 100 MCG/2ML IJ SOLN
25.0000 ug | INTRAMUSCULAR | Status: DC | PRN
  Administered 2016-06-20: 50 ug via INTRAVENOUS

## 2016-06-20 MED ORDER — MEPERIDINE HCL 25 MG/ML IJ SOLN: 6.2500 mg | INTRAMUSCULAR | Status: DC | PRN

## 2016-06-20 MED ORDER — FENTANYL CITRATE (PF) 100 MCG/2ML IJ SOLN
INTRAMUSCULAR | Status: AC
  Filled 2016-06-20: qty 2

## 2016-06-20 MED ORDER — 0.9 % SODIUM CHLORIDE (POUR BTL) OPTIME
TOPICAL | Status: DC | PRN
  Administered 2016-06-20: 1000 mL

## 2016-06-20 MED ORDER — ROCURONIUM BROMIDE 50 MG/5ML IV SOSY
PREFILLED_SYRINGE | INTRAVENOUS | Status: AC
  Filled 2016-06-20: qty 5

## 2016-06-20 MED ORDER — MIDAZOLAM HCL 5 MG/5ML IJ SOLN
INTRAMUSCULAR | Status: DC | PRN
  Administered 2016-06-20: 2 mg via INTRAVENOUS

## 2016-06-20 MED ORDER — IBUPROFEN 400 MG PO TABS: 800.0000 mg | ORAL_TABLET | Freq: Three times a day (TID) | ORAL | Status: DC | PRN

## 2016-06-20 MED ORDER — LACTATED RINGERS IV SOLN
Freq: Once | INTRAVENOUS | Status: AC
  Administered 2016-06-20: 10:00:00 via INTRAVENOUS

## 2016-06-20 MED ORDER — LIDOCAINE 2% (20 MG/ML) 5 ML SYRINGE
INTRAMUSCULAR | Status: AC
  Filled 2016-06-20: qty 5

## 2016-06-20 MED ORDER — ONDANSETRON HCL 4 MG/2ML IJ SOLN
INTRAMUSCULAR | Status: DC | PRN
  Administered 2016-06-20: 4 mg via INTRAVENOUS

## 2016-06-20 MED ORDER — LACTATED RINGERS IV SOLN
INTRAVENOUS | Status: DC | PRN
  Administered 2016-06-20: 11:00:00 via INTRAVENOUS

## 2016-06-20 MED ORDER — DEXAMETHASONE SODIUM PHOSPHATE 10 MG/ML IJ SOLN
INTRAMUSCULAR | Status: AC
  Filled 2016-06-20: qty 1

## 2016-06-20 MED ORDER — MIDAZOLAM HCL 2 MG/2ML IJ SOLN
INTRAMUSCULAR | Status: AC
  Filled 2016-06-20: qty 2

## 2016-06-20 MED ORDER — FENTANYL CITRATE (PF) 250 MCG/5ML IJ SOLN
INTRAMUSCULAR | Status: AC
  Filled 2016-06-20: qty 5

## 2016-06-20 MED ORDER — LIDOCAINE 2% (20 MG/ML) 5 ML SYRINGE
INTRAMUSCULAR | Status: DC | PRN
  Administered 2016-06-20: 100 mg via INTRAVENOUS

## 2016-06-20 MED ORDER — KETOROLAC TROMETHAMINE 30 MG/ML IJ SOLN
30.0000 mg | Freq: Once | INTRAMUSCULAR | Status: DC | PRN
Start: 2016-06-20 — End: 2016-06-20

## 2016-06-20 MED ORDER — ROCURONIUM BROMIDE 10 MG/ML (PF) SYRINGE
PREFILLED_SYRINGE | INTRAVENOUS | Status: DC | PRN
  Administered 2016-06-20: 30 mg via INTRAVENOUS
  Administered 2016-06-20: 20 mg via INTRAVENOUS

## 2016-06-20 MED ORDER — PROPOFOL 10 MG/ML IV BOLUS
INTRAVENOUS | Status: AC
Start: 2016-06-20 — End: 2016-06-20
  Filled 2016-06-20: qty 40

## 2016-06-20 MED ORDER — LEVETIRACETAM 500 MG PO TABS
500.0000 mg | ORAL_TABLET | Freq: Two times a day (BID) | ORAL | Status: DC
  Administered 2016-06-20 – 2016-06-22 (×4): 500 mg via ORAL
  Filled 2016-06-20 (×4): qty 1

## 2016-06-20 MED ORDER — ONDANSETRON HCL 4 MG/2ML IJ SOLN: 4.0000 mg | Freq: Once | INTRAMUSCULAR | Status: DC | PRN

## 2016-06-20 MED ORDER — OXYCODONE HCL 5 MG PO TABS
5.0000 mg | ORAL_TABLET | ORAL | Status: DC | PRN
  Administered 2016-06-20 – 2016-06-22 (×8): 5 mg via ORAL
  Filled 2016-06-20 (×8): qty 1

## 2016-06-20 SURGICAL SUPPLY — 39 items
ADAPTER BRONCH F/PENTAX (ADAPTER) ×3 IMPLANT
BRUSH CYTOL CELLEBRITY 1.5X140 (MISCELLANEOUS) ×3 IMPLANT
BRUSH SUPERTRAX BIOPSY (INSTRUMENTS) IMPLANT
BRUSH SUPERTRAX NDL-TIP CYTO (INSTRUMENTS) ×6 IMPLANT
CANISTER SUCT 3000ML PPV (MISCELLANEOUS) ×3 IMPLANT
CHANNEL WORK EXTEND EDGE 180 (KITS) IMPLANT
CHANNEL WORK EXTEND EDGE 45 (KITS) IMPLANT
CHANNEL WORK EXTEND EDGE 90 (KITS) IMPLANT
CONT SPEC 4OZ CLIKSEAL STRL BL (MISCELLANEOUS) ×3 IMPLANT
COVER BACK TABLE 60X90IN (DRAPES) ×3 IMPLANT
FILTER STRAW FLUID ASPIR (MISCELLANEOUS) IMPLANT
FORCEPS BIOP SUPERTRX PREMAR (INSTRUMENTS) ×3 IMPLANT
GAUZE SPONGE 4X4 12PLY STRL (GAUZE/BANDAGES/DRESSINGS) ×3 IMPLANT
GLOVE BIO SURGEON STRL SZ7.5 (GLOVE) ×6 IMPLANT
GOWN STRL REUS W/ TWL LRG LVL3 (GOWN DISPOSABLE) ×2 IMPLANT
GOWN STRL REUS W/TWL LRG LVL3 (GOWN DISPOSABLE) ×4
KIT CLEAN ENDO COMPLIANCE (KITS) ×3 IMPLANT
KIT LOCATABLE GUIDE (CANNULA) IMPLANT
KIT MARKER FIDUCIAL DELIVERY (KITS) IMPLANT
KIT PROCEDURE EDGE 180 (KITS) ×3 IMPLANT
KIT PROCEDURE EDGE 45 (KITS) IMPLANT
KIT PROCEDURE EDGE 90 (KITS) IMPLANT
KIT ROOM TURNOVER OR (KITS) ×3 IMPLANT
MARKER SKIN DUAL TIP RULER LAB (MISCELLANEOUS) ×3 IMPLANT
NEEDLE SUPERTRX PREMARK BIOPSY (NEEDLE) ×3 IMPLANT
NS IRRIG 1000ML POUR BTL (IV SOLUTION) ×3 IMPLANT
OIL SILICONE PENTAX (PARTS (SERVICE/REPAIRS)) ×3 IMPLANT
PAD ARMBOARD 7.5X6 YLW CONV (MISCELLANEOUS) ×6 IMPLANT
PATCHES PATIENT (LABEL) ×3 IMPLANT
SYR 20CC LL (SYRINGE) ×3 IMPLANT
SYR 20ML ECCENTRIC (SYRINGE) ×3 IMPLANT
SYR 50ML SLIP (SYRINGE) ×3 IMPLANT
SYSTEM GENCUT CORE BIOPSY (NEEDLE) ×3 IMPLANT
TOWEL OR 17X24 6PK STRL BLUE (TOWEL DISPOSABLE) ×3 IMPLANT
TRAP SPECIMEN MUCOUS 40CC (MISCELLANEOUS) IMPLANT
TUBE CONNECTING 20'X1/4 (TUBING) ×1
TUBE CONNECTING 20X1/4 (TUBING) ×2 IMPLANT
UNDERPAD 30X30 (UNDERPADS AND DIAPERS) ×3 IMPLANT
WATER STERILE IRR 1000ML POUR (IV SOLUTION) ×3 IMPLANT

## 2016-06-20 NOTE — Anesthesia Procedure Notes (Signed)
Procedure Name: Intubation Date/Time: 06/20/2016 12:05 PM Performed by: Everlean Cherry A Pre-anesthesia Checklist: Patient identified, Emergency Drugs available, Suction available and Patient being monitored Patient Re-evaluated:Patient Re-evaluated prior to inductionOxygen Delivery Method: Circle system utilized Preoxygenation: Pre-oxygenation with 100% oxygen Intubation Type: IV induction Ventilation: Mask ventilation without difficulty Laryngoscope Size: Mac and 4 Grade View: Grade II Tube type: Oral Tube size: 9.0 mm Number of attempts: 1 Airway Equipment and Method: Stylet Placement Confirmation: ETT inserted through vocal cords under direct vision,  positive ETCO2 and breath sounds checked- equal and bilateral Secured at: 24 cm Tube secured with: Tape Dental Injury: Teeth and Oropharynx as per pre-operative assessment

## 2016-06-20 NOTE — Anesthesia Preprocedure Evaluation (Signed)
Anesthesia Evaluation  Patient identified by MRN, date of birth, ID band Patient awake    Reviewed: Allergy & Precautions, NPO status , Patient's Chart, lab work & pertinent test results  Airway Mallampati: I       Dental no notable dental hx.    Pulmonary Current Smoker,    Pulmonary exam normal        Cardiovascular negative cardio ROS Normal cardiovascular exam     Neuro/Psych negative psych ROS   GI/Hepatic negative GI ROS, Neg liver ROS,   Endo/Other  negative endocrine ROS  Renal/GU negative Renal ROS  negative genitourinary   Musculoskeletal negative musculoskeletal ROS (+)   Abdominal (+) + scaphoid   Peds  Hematology  (+) Blood dyscrasia, anemia ,   Anesthesia Other Findings   Reproductive/Obstetrics                             Anesthesia Physical Anesthesia Plan  ASA: II  Anesthesia Plan: General   Post-op Pain Management:    Induction: Intravenous  Airway Management Planned: Oral ETT  Additional Equipment:   Intra-op Plan:   Post-operative Plan: Extubation in OR  Informed Consent: I have reviewed the patients History and Physical, chart, labs and discussed the procedure including the risks, benefits and alternatives for the proposed anesthesia with the patient or authorized representative who has indicated his/her understanding and acceptance.   Dental advisory given  Plan Discussed with: CRNA and Surgeon  Anesthesia Plan Comments:         Anesthesia Quick Evaluation

## 2016-06-20 NOTE — Op Note (Signed)
Video Bronchoscopy with Electromagnetic Navigation Procedure Note  Date of Operation: 06/20/2016  Pre-op Diagnosis: RUL nodule  Post-op Diagnosis: same  Surgeon: Baltazar Apo  Assistants: none  Anesthesia: General endotracheal anesthesia  Operation: Flexible video fiberoptic bronchoscopy with electromagnetic navigation and biopsies.  Estimated Blood Loss: Minimal  Complications: none apparent  Indications and History: Eric Mason is a 46 y.o. male with hx tobacco use, found to have a R temporal mass, and then found to have a RUL nodule on CT chest concerning for malignancy. Recommendation made to achieve tissue dx via navigational bronchoscopy.  The risks, benefits, complications, treatment options and expected outcomes were discussed with the patient.  The possibilities of pneumothorax, pneumonia, reaction to medication, pulmonary aspiration, perforation of a viscus, bleeding, failure to diagnose a condition and creating a complication requiring transfusion or operation were discussed with the patient who freely signed the consent.    Description of Procedure: The patient was seen in the Preoperative Area, was examined and was deemed appropriate to proceed.  The patient was taken to OR 10, identified as [name] and the procedure verified as Flexible Video Fiberoptic Bronchoscopy.  A Time Out was held and the above information confirmed.   Prior to the date of the procedure a high-resolution CT scan of the chest was performed. Utilizing [place] a virtual tracheobronchial tree was generated to allow the creation of distinct navigation pathways to the patient's parenchymal abnormality in the RUL. After being taken to the operating room general anesthesia was initiated and the patient  was orally intubated. The video fiberoptic bronchoscope was introduced via the endotracheal tube and a general inspection was performed which showed normal airways throughout.  The extendable working channel and locator guide were introduced into the bronchoscope. The distinct navigation pathways prepared prior to this procedure were then utilized to navigate to within 0.3 - 0.9 cm of the center of patient's lesion identified on CT scan. The extendable working channel was secured into place and the locator guide was withdrawn. Under fluoroscopic guidance transbronchial needle brushings, transbronchial Wang needle biopsies, and transbronchial forceps biopsies were performed to be sent for cytology and pathology. A bronchioalveolar lavage was performed in the RUL and sent for cytology. At the end of the procedure a general airway inspection was performed and there was no evidence of active bleeding. The bronchoscope was removed.  The patient tolerated the procedure well. There was no significant blood loss and there were no obvious complications. A post-procedural chest x-ray is pending.  Samples: 1. Transbronchial needle brushings from RUL Nodule 2. Transbronchial Wang needle biopsies from RUL Nodule 3. Transbronchial forceps biopsies from RUL Nodule 4. Bronchoalveolar lavage from RUL.   Plans:  The patient will be transferred back to floor bed from the PACU to home when recovered from anesthesia and after chest x-ray is reviewed. We will review the cytology, pathology results with the patient when they become available.    Baltazar Apo, MD, PhD 06/20/2016, 2:01 PM [facility] [phone] or if no answer [phone]

## 2016-06-20 NOTE — Progress Notes (Signed)
Report given to [facility] for transfer to 5W.
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
## 2016-06-20 NOTE — Transfer of Care (Signed)
Immediate Anesthesia Transfer of Care Note  Patient: Eric Mason  Procedure(s) Performed: Procedure(s): VIDEO BRONCHOSCOPY WITH ENDOBRONCHIAL NAVIGATION (N/A)  Patient Location: PACU  Anesthesia Type:General  Level of Consciousness: awake, alert , oriented and patient cooperative  Airway & Oxygen Therapy: Patient Spontanous Breathing and Patient connected to face mask oxygen  Post-op Assessment: Report given to RN, Post -op Vital signs reviewed and stable and Patient moving all extremities X 4  Post vital signs: Reviewed and stable  Last Vitals:  Vitals:   06/20/16 1000 06/20/16 1411  BP:    Pulse:    Resp: 14   Temp:  36.8 C    Last Pain:  Vitals:   06/20/16 0818  TempSrc: Oral  PainSc:       Patients Stated Pain Goal: 2 (79/89/21 1941)  Complications: No apparent anesthesia complications

## 2016-06-20 NOTE — Progress Notes (Signed)
I called and spoke with the patient about the role for [place] meeting with him and that we'll proceed with evaluation once he's had completion of his work up. We will also plan to review his case in conference. He's going for a biopsy today and we hope to have results by the end of the week. He is in agreement with this plan.     Carola Rhine, PAC

## 2016-06-20 NOTE — Interval H&P Note (Signed)
PCCM Interval Note   No new issues reported since yesterday. He continues to have some headache occasional dizziness area and he does not have any lightheadedness. He denies any cough, dyspnea, chest discomfort.  Vitals:   06/20/16 0730 06/20/16 0818 06/20/16 0900 06/20/16 1000  BP:  (!) 141/85    Pulse:  (!) 49    Resp: '17 14 15 14  '$ Temp:  97.4 F (36.3 C)    TempSrc:  Oral    SpO2:  98%    Height:       Gen: Thin gentleman, no distress, appropriate  ENT: No oral lesions, no stridor  Lungs: No use of accessory muscles, clear without rales or rhonchi  Cardiovascular: Regular, no murmur, no peripheral edema  Abdomen: soft, nontender, positive bowel sounds  Musculoskeletal: No deformities  Neuro: Awake, alert, appropriate, nonfocal  Skin: No rashes   CT chest 06/19/16 --  FINDINGS: Cardiovascular: Normal heart size. No significant pericardial fluid/thickening. Left anterior descending coronary atherosclerosis . Great vessels are normal in course and caliber.  Mediastinum/Nodes: No discrete thyroid nodules. Unremarkable esophagus. No pathologically enlarged axillary, mediastinal or gross hilar lymph nodes, noting limited sensitivity for the detection of hilar adenopathy on this noncontrast study.  Lungs/Pleura: No pneumothorax. No pleural effusion. Moderate paraseptal and mild centrilobular emphysema with mild diffuse bronchial wall thickening. Solid 1.2 x 1.1 cm posterior right upper lobe pulmonary nodule (series 4/image 30), stable since 06/17/2016 chest CT. Solid 0.5 cm posterior left lower lobe pulmonary nodule (series 4/ image 51), stable. No acute consolidative airspace disease or additional significant pulmonary nodules. Bandlike opacities in the dependent lower lobes, favor mild postinfectious scarring and/ or hypoventilatory change.  Upper abdomen: Unremarkable.  Musculoskeletal:  No aggressive appearing focal osseous lesions.  IMPRESSION: 1. Solid  1.2 cm posterior right upper lobe pulmonary nodule. Biopsy is reportedly planned for this nodule. 2. Solid 0.5 cm posterior left lower lobe pulmonary nodule. Follow-up of this pulmonary nodule depends on the results of the planned right upper lobe pulmonary nodule biopsy. 3. No thoracic adenopathy. 4. Moderate paraseptal and mild centrilobular emphysema with mild diffuse bronchial wall thickening, suggesting COPD . 5. One vessel coronary atherosclerosis.   Impression:  Right Temporal lobe mass and right lower lobe pulmonary nodule in a patient with history tobacco use, empysema. Suspect that this is primary lung cancer. He will undergo navigational bronchoscopy for tissue diagnosis today. He understands the potential risks including pneumothorax, bleeding, prolonged ventilatory support given his history of emphysema. All questions answered. He agrees to proceed. No barriers identified.    Baltazar Apo, MD, PhD 06/20/2016, 10:27 AM [facility] [phone] or if no answer [phone]

## 2016-06-20 NOTE — Progress Notes (Signed)
Baltazar Najjar NP text paged twice regarding whether pt needed AM labs. No response or new orders as of this note.
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
## 2016-06-20 NOTE — Anesthesia Postprocedure Evaluation (Addendum)
Anesthesia Post Note  Patient: Eric Mason  Procedure(s) Performed: Procedure(s) (LRB): VIDEO BRONCHOSCOPY WITH ENDOBRONCHIAL NAVIGATION (N/A)  Patient location during evaluation: PACU Anesthesia Type: General Level of consciousness: awake Pain management: pain level controlled Vital Signs Assessment: post-procedure vital signs reviewed and stable Respiratory status: spontaneous breathing Cardiovascular status: stable Postop Assessment: no signs of nausea or vomiting Anesthetic complications: no        Last Vitals:  Vitals:   06/20/16 1415 06/20/16 1430  BP: (!) 149/86 140/85  Pulse: 70 64  Resp: 11 13  Temp:      Last Pain:  Vitals:   06/20/16 1411  TempSrc:   PainSc: 0-No pain   Pain Goal: Patients Stated Pain Goal: 2 (06/19/16 1725)               [place]

## 2016-06-21 ENCOUNTER — Encounter (HOSPITAL_COMMUNITY): Payer: Self-pay | Admitting: Emergency Medicine

## 2016-06-21 NOTE — Progress Notes (Signed)
Pt seen and examined.  No issues overnight. Denies neuro sx.  EXAM: Temp:  [97.7 F (36.5 C)-98.3 F (36.8 C)] 98.2 F (36.8 C) (04/12 0612) Pulse Rate:  [52-71] 53 (04/12 0612) Resp:  [10-18] 17 (04/12 0612) BP: (126-149)/(71-91) 126/71 (04/12 0612) SpO2:  [99 %-100 %] 99 % (04/12 0612) Weight:  [60.7 kg (133 lb 13.1 oz)] 60.7 kg (133 lb 13.1 oz) (04/11 2105) Intake/Output      04/11 0701 - 04/12 0700 04/12 0701 - 04/13 0700   P.O. 75    I.V. (mL/kg) 1350 (22.2)    IV Piggyback 105    Total Intake(mL/kg) 1530 (25.2)    Urine (mL/kg/hr) 1000 (0.7)    Blood 5 (0)    Total Output 1005     Net +525           Awake and alert Follows commands throughout Full strength  Plan Doing well. Remains neuro intact. He underwent bronchoscopy for biopsy of lung nodule yesterday. At this time, he can be discharged from a neurosurgical stand point and follow up outpt for management of his brain tumor. Plan of care with his brain tumor will depend on biopsy result. Please call for any questions.

## 2016-06-21 NOTE — Progress Notes (Signed)
06/21/16 Per Dr Reesa Chew - walk patient x1 - x2, if tolerates well patient can be independent in the halls and in his room. Pt has ambulated in halls and has tolerated well, with no dizziness, or unsteadiness noted. Patient was on high fall risk due to a seizure at work, the high risk falls interventions have been removed and patient is now up independently per Dr Latina Craver orders.

## 2016-06-21 NOTE — Progress Notes (Signed)
Received pt.from 4N,via bed AAOX4;no distress noted;V/S taken & recorded.Oriented pt.to the room & call bell,Skin assessment done with [facility].Noted abrasion on forehead & rt.leg with foam dsd.in place.Made comfortable in bed.Seizure precs.with bed rails padded.Will continue to monitor pt.
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
## 2016-06-21 NOTE — Progress Notes (Signed)
   [facility]  > pt seen, examined.  > No subjective complaints. Comfortable.   > He had EBUS + Bronch and biopsy of lung nodule.  > awaiting lung Bx results.  I am not sure if they will be back by Friday.  Could be next week.  If he wants to go home,  he can follow up with Pulmonary next week to discuss results.    Monica Becton, MD 06/21/2016, 2:24 PM [facility] Pager [phone] After 3 pm or if no answer, call [phone]

## 2016-06-22 ENCOUNTER — Other Ambulatory Visit: Payer: Self-pay

## 2016-06-22 ENCOUNTER — Other Ambulatory Visit: Payer: Self-pay | Admitting: Radiation Therapy

## 2016-06-22 DIAGNOSIS — R911 Solitary pulmonary nodule: Secondary | ICD-10-CM

## 2016-06-22 DIAGNOSIS — C7931 Secondary malignant neoplasm of brain: Secondary | ICD-10-CM

## 2016-06-22 DIAGNOSIS — G939 Disorder of brain, unspecified: Secondary | ICD-10-CM

## 2016-06-22 DIAGNOSIS — C7949 Secondary malignant neoplasm of other parts of nervous system: Principal | ICD-10-CM

## 2016-06-22 MED ORDER — OXYCODONE HCL 5 MG PO TABS: 5.0000 mg | ORAL_TABLET | Freq: Four times a day (QID) | ORAL | 0 refills | Status: DC | PRN

## 2016-06-22 MED ORDER — THIAMINE HCL 100 MG PO TABS
100.0000 mg | ORAL_TABLET | Freq: Every day | ORAL | 0 refills | Status: AC
Start: 2016-06-23 — End: 2016-07-23

## 2016-06-22 MED ORDER — NICOTINE 21 MG/24HR TD PT24: 21.0000 mg | MEDICATED_PATCH | Freq: Every day | TRANSDERMAL | 0 refills | Status: DC

## 2016-06-22 MED ORDER — DEXAMETHASONE 4 MG PO TABS: 4.0000 mg | ORAL_TABLET | Freq: Four times a day (QID) | ORAL | 0 refills | Status: AC

## 2016-06-22 MED ORDER — FOLIC ACID 1 MG PO TABS: 1.0000 mg | ORAL_TABLET | Freq: Every day | ORAL | 0 refills | Status: AC

## 2016-06-22 MED ORDER — ADULT MULTIVITAMIN W/MINERALS CH: 1.0000 | ORAL_TABLET | Freq: Every day | ORAL | 0 refills | Status: AC

## 2016-06-22 MED ORDER — LEVETIRACETAM 500 MG PO TABS: 500.0000 mg | ORAL_TABLET | Freq: Two times a day (BID) | ORAL | 0 refills | Status: DC
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
## 2016-06-22 NOTE — Progress Notes (Signed)
Location/Histology of Brain Tumor: Subjective/Objective:                Pt with altered mental status, possible seizure, with right temporal lobe mass.  Patient presented with symptoms of:  Altered mental status unsteady gait, fall  Past or anticipated interventions, if any, per neurosurgery: 4/11/218:  Pre-op Diagnosis: RUL nodule  Post-op Diagnosis: same  Surgeon: Baltazar Apo Assistants: none  Anesthesia: General endotracheal anesthesia  Operation: Flexible video fiberoptic bronchoscopy with electromagnetic navigation and biopsies.  Past or anticipated interventions, if any, per medical oncology:   Dose of Decadron, if applicable '4mg'$   Po every 6 hours    Recent neurologic symptoms, if any:   Seizures:  seizure 06/16/16  At work, started on [place] in the ED  Headaches:   Nausea:  Dizziness/ataxia:   Difficulty with hand coordination:   Focal numbness/weakness:   Visual deficits/changes:   Confusion/Memory deficits:  Painful bone metastases at present, if any:   SAFETY ISSUES:  Prior radiation? NO  Pacemaker/ICD? NO    Is the patient on methotrexate? NO  Additional Complaints / other details:HX tobacco abuse, uses Cocaine and Marijuana,alcohol,  emphysema, Mother Thyroid cancer,HTN, Father HTN   06/26/16 MRI 3T scheduled , Ct simulation, SRS tx Brain 07/03/16, Pet Scan scheduled 07/03/16 06/17/2016 CT Head:  IMPRESSION: 1. Stable 13 x 11 x 9 mm mass centered at the posterior right temporal lobe. Mild localized edema without significant mass effect. Study will be used for intraoperative guidance. 2. No other acute intracranial process. Diagnosis 07/20/2016: BRONCHIAL LAVAGE, NAVIGATION, RUL, F (SPECIMEN 5 OF 6, COLLECTED 06/21/46): NO MALIGNANT CELLS IDENTIFIED. Diagnosis WANG, FINE NEEDLE ASPIRATION, NAVIGATION, RUL BRUSHING SHEATH CONTENTS, E (SPECIMEN 6 OF 6, COLLECTED 06/20/16): NO MALIGNANT CELLS IDENTIFIED Diagnosis BRONCHIAL  BRUSHING,NAVIGATION RIGHT UPPER LOBE, SPECIMEN D (SPECIMEN 4 OF 6 COLLECTED 06/20/2016) NO MALIGNANT CELLS IDENTIFIED. Diagnosis WANG, FINE NEEDLE ASPIRATION, NAVIGATION, RUL, C (SPECIMEN 3 OF 6, COLLECTED 06/20/16): NO MALIGNANT CELLS IDENTIFIED. Diagnosis Lung, biopsy, Right Upper Lobe - NONDIAGNOSTIC (NO TISSUE PRESENT Diagnosis TRANSBRONCHIAL ASPIRATION, NAVIGATION B, RIGHT UPPER LOBE (SPECIMEN 2 OF 6 COLLECTED 06/20/2016) NO MALIGNANT CELLS IDENTIFIED Diagnosis TRANSBRONCHIAL NEEDLE, NAVIGATION A, RIGHT UPPER LOBE BRUSHING (SPECIMEN 1 OF 6 COLLECTED 06/20/2016) NO MALIGNANT CELLS IDENTIFIED.

## 2016-06-22 NOTE — Progress Notes (Signed)
   [facility] PCCM > f/u on pt's lung biopsy result.  Results were (-) for malignancy or cancer cells but we still can NOT R/O Lung CA.  > pt with 12 mm R temporal lobe brain mass + 11 mm posterior segment RUL nodule.   > Neurosurgery holding off on brain biopsy. Dr. Lamonte Sakai did EBUS and Nav bronch and results are (-) for CA > noted plans for d/c today > pt seen, VSS, comfortable, CTA, preparing to go home.    Plan > pt will need a PET scan as an outpt.  I will try to coordinate and order today. Plan to get the PET scan next week.  > pt will need to be seen by [place].  He has a f/u appointment with Tammy Parrett on 07/02/16 at 3:15pm.   > we need to determine if there are other "lesions" we can biopsy easier.  > plan to  refer him to tumor boards as outpt.  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 Will wait on PET scan before we make this referral.   > plan extensively d/w pt.    Monica Becton, MD 06/22/2016, 10:27 AM [facility] Pager [phone] After 3 pm or if no answer, call [phone]

## 2016-06-22 NOTE — Progress Notes (Addendum)
Patient was discharged home by MD order; discharged instructions  review and give to patient and his wife with care notes and prescriptions; IV DIC; patient will be escorted to the car by a volunteer via wheelchair.

## 2016-06-22 NOTE — Care Management Note (Signed)
Case Management Note  Patient Details  Name: Eric Mason MRN: [id number] Date of Birth: 1970-08-24  Subjective/Objective:                Pt with altered mental status, possible seizure, with right temporal lobe mass.   Action/Plan: Plan is to d/c to home today. Pt without PCP/ no insurance. CM spoke and provided pt with [facility] brochure. Pt stated interest in clinic. Post hospital f/u scheduled per CM @ [place]. Match Letter given and explained to pt to assist with medication needs, pt states can't afford medication.  Expected Discharge Date:  06/22/16               Expected Discharge Plan:  Home/Self Care  In-House Referral:     Discharge planning Services   CM Consult, [facility], Follow-up appt scheduled, [place] [facility] (post hospital follow up scheduled for 06/25/2016 at 10:30am with Zettie Pho NP @ [facility])  Post Acute Care Choice:    Choice offered to:     DME Arranged:    DME Agency:     HH Arranged:    [place] Agency:     Status of Service:  Completed, signed off  If discussed at [employer] of Stay Meetings, dates discussed:    Additional Comments:  Sharin Mons, RN 06/22/2016, 10:51 AM

## 2016-06-25 ENCOUNTER — Other Ambulatory Visit: Payer: Self-pay | Admitting: Radiation Oncology

## 2016-06-25 ENCOUNTER — Encounter: Payer: Self-pay | Admitting: *Deleted

## 2016-06-25 ENCOUNTER — Ambulatory Visit
Admission: RE | Admit: 2016-06-25 | Discharge: 2016-06-25 | Disposition: A | Discharge status: Home / Self Care | Payer: Medicaid Other | Source: Ambulatory Visit | Attending: Radiation Oncology | Admitting: Radiation Oncology

## 2016-06-25 ENCOUNTER — Telehealth: Payer: Self-pay | Admitting: Pulmonary Disease

## 2016-06-25 ENCOUNTER — Ambulatory Visit: Payer: Medicaid Other | Attending: Internal Medicine | Admitting: Physician Assistant

## 2016-06-25 VITALS — BP 119/72 | HR 68 | Temp 98.2°F | Resp 18 | Ht 70.0 in | Wt 127.0 lb

## 2016-06-25 DIAGNOSIS — C7931 Secondary malignant neoplasm of brain: Secondary | ICD-10-CM | POA: Diagnosis not present

## 2016-06-25 DIAGNOSIS — R911 Solitary pulmonary nodule: Secondary | ICD-10-CM

## 2016-06-25 DIAGNOSIS — Z79899 Other long term (current) drug therapy: Secondary | ICD-10-CM | POA: Insufficient documentation

## 2016-06-25 DIAGNOSIS — Z510 Encounter for antineoplastic radiation therapy: Secondary | ICD-10-CM | POA: Insufficient documentation

## 2016-06-25 DIAGNOSIS — R569 Unspecified convulsions: Secondary | ICD-10-CM | POA: Diagnosis not present

## 2016-06-25 DIAGNOSIS — F17210 Nicotine dependence, cigarettes, uncomplicated: Secondary | ICD-10-CM | POA: Diagnosis not present

## 2016-06-25 DIAGNOSIS — Z888 Allergy status to other drugs, medicaments and biological substances status: Secondary | ICD-10-CM | POA: Diagnosis not present

## 2016-06-25 DIAGNOSIS — Z8249 Family history of ischemic heart disease and other diseases of the circulatory system: Secondary | ICD-10-CM | POA: Insufficient documentation

## 2016-06-25 DIAGNOSIS — G939 Disorder of brain, unspecified: Secondary | ICD-10-CM | POA: Diagnosis not present

## 2016-06-25 DIAGNOSIS — R918 Other nonspecific abnormal finding of lung field: Secondary | ICD-10-CM | POA: Insufficient documentation

## 2016-06-25 DIAGNOSIS — G9389 Other specified disorders of brain: Secondary | ICD-10-CM

## 2016-06-25 DIAGNOSIS — R51 Headache: Principal | ICD-10-CM

## 2016-06-25 DIAGNOSIS — R42 Dizziness and giddiness: Secondary | ICD-10-CM | POA: Insufficient documentation

## 2016-06-25 DIAGNOSIS — Z808 Family history of malignant neoplasm of other organs or systems: Secondary | ICD-10-CM | POA: Diagnosis not present

## 2016-06-25 DIAGNOSIS — F17200 Nicotine dependence, unspecified, uncomplicated: Secondary | ICD-10-CM

## 2016-06-25 DIAGNOSIS — Z9889 Other specified postprocedural states: Secondary | ICD-10-CM | POA: Diagnosis not present

## 2016-06-25 DIAGNOSIS — R519 Headache, unspecified: Secondary | ICD-10-CM

## 2016-06-25 MED ORDER — OXYCODONE HCL 5 MG PO TABS: 5.0000 mg | ORAL_TABLET | Freq: Four times a day (QID) | ORAL | 0 refills | Status: DC | PRN

## 2016-06-25 MED ORDER — FENTANYL 12 MCG/HR TD PT72: 25.0000 ug | MEDICATED_PATCH | TRANSDERMAL | 0 refills | Status: DC

## 2016-06-25 MED ORDER — LEVETIRACETAM 500 MG PO TABS: 500.0000 mg | ORAL_TABLET | Freq: Two times a day (BID) | ORAL | 3 refills | Status: DC

## 2016-06-25 MED ORDER — ONDANSETRON HCL 8 MG PO TABS: 8.0000 mg | ORAL_TABLET | Freq: Three times a day (TID) | ORAL | 0 refills | Status: DC | PRN

## 2016-06-25 MED ORDER — FENTANYL 25 MCG/HR TD PT72: 25.0000 ug | MEDICATED_PATCH | TRANSDERMAL | 0 refills | Status: DC

## 2016-06-25 MED FILL — oxyCODONE HCL 5 MG TABS: 5 | 5 days supply | Qty: 30 | Fill #0

## 2016-06-25 NOTE — Patient Instructions (Signed)
The neurosurgeon's name is Ferne Reus, PA-C/Dr. Ditty  Dr. Reesa Chew was the internal medicine doctor in charge of your total care, you will not need to see them again

## 2016-06-25 NOTE — Telephone Encounter (Signed)
Spoke with pt and he is aware of AD's recommendation for a PET scan, pt had agreed to the order and the order was placed. PCCs will work on getting pt scanned and move his follow up appointment if needed   [employer], MD  Marin Roberts, [place]; Collier Salina, RN; Melvenia Needles, NP          Dr. Lamonte Sakai and myself saw this pt. He is to be discharged today. Can we pls arrange for this pt to get a PET scan urgently next weeK?   He has a lung nodule as well as a brain lesion. Dr. Lamonte Sakai did nav bronch with bx and results are (-).   Pt is to see TP on 4/23. It would be ideal if we can get pet scan done before his f/u. If not, we can move f/u after pet scan is done.   Thanks.   TP >> this is just an FYI. thanks   J. Shirl Harris, MD
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
## 2016-06-25 NOTE — Progress Notes (Signed)
Location/Histology of Brain Tumor: Subjective/Objective:                Pt with altered mental status, possible seizure, with right temporal lobe mass.  Patient presented with symptoms of:  Altered mental status unsteady gait, fall  Past or anticipated interventions, if any, per neurosurgery: 4/11/218:  Pre-op Diagnosis: RUL nodule  Post-op Diagnosis: same  Surgeon: Baltazar Apo Assistants: none  Anesthesia: General endotracheal anesthesia  Operation: Flexible video fiberoptic bronchoscopy with electromagnetic navigation and biopsies.  Past or anticipated interventions, if any, per medical oncology: no  Dose of Decadron, if applicable '4mg'$   Po every 6 hours    Recent neurologic symptoms, if any:   Seizures:  seizure 06/16/16  At work, started on [place] in the ED  Headaches:  Yes daily  Nausea: mild  Dizziness/ataxia: occassional dizziness  Difficulty with hand coordination: no  Focal numbness/weakness: no  Visual deficits/changes: no  Confusion/Memory deficits: Yes as reported by wife and cousin.  Short term memory loss, lasting approximately 1 hour.  Forgot wife, his own name, who the president was.  Painful bone metastases at present, if any:   SAFETY ISSUES:  Prior radiation? NO  Pacemaker/ICD? NO    Is the patient on methotrexate? NO  Additional Complaints / other details:HX tobacco abuse, uses Cocaine and Marijuana,alcohol,  emphysema, Mother Thyroid cancer,HTN, Father HTN   06/26/16 MRI 3T scheduled , Ct simulation, SRS tx Brain 07/03/16, Pet Scan scheduled 07/03/16 06/17/2016 CT Head:  IMPRESSION: 1. Stable 13 x 11 x 9 mm mass centered at the posterior right temporal lobe. Mild localized edema without significant mass effect. Study will be used for intraoperative guidance. 2. No other acute intracranial process. Diagnosis 07/20/2016: BRONCHIAL LAVAGE, NAVIGATION, RUL, F (SPECIMEN 5 OF 6, COLLECTED 06/21/46): NO MALIGNANT CELLS  IDENTIFIED. Diagnosis WANG, FINE NEEDLE ASPIRATION, NAVIGATION, RUL BRUSHING SHEATH CONTENTS, E (SPECIMEN 6 OF 6, COLLECTED 06/20/16): NO MALIGNANT CELLS IDENTIFIED Diagnosis BRONCHIAL BRUSHING,NAVIGATION RIGHT UPPER LOBE, SPECIMEN D (SPECIMEN 4 OF 6 COLLECTED 06/20/2016) NO MALIGNANT CELLS IDENTIFIED. Diagnosis WANG, FINE NEEDLE ASPIRATION, NAVIGATION, RUL, C (SPECIMEN 3 OF 6, COLLECTED 06/20/16): NO MALIGNANT CELLS IDENTIFIED. Diagnosis Lung, biopsy, Right Upper Lobe - NONDIAGNOSTIC (NO TISSUE PRESENT Diagnosis TRANSBRONCHIAL ASPIRATION, NAVIGATION B, RIGHT UPPER LOBE (SPECIMEN 2 OF 6 COLLECTED 06/20/2016) NO MALIGNANT CELLS IDENTIFIED Diagnosis TRANSBRONCHIAL NEEDLE, NAVIGATION A, RIGHT UPPER LOBE BRUSHING (SPECIMEN 1 OF 6 COLLECTED 06/20/2016) NO MALIGNANT CELLS IDENTIFIED.   Vitals:   06/25/16 1245  BP: 136/75  Pulse: 65  Resp: 18  Temp: 99.4 F (37.4 C)  TempSrc: Oral  SpO2: 100%  Weight: 127 lb (57.6 kg)    Wt Readings from Last 3 Encounters:  06/25/16 127 lb (57.6 kg)  06/25/16 127 lb (57.6 kg)  06/20/16 133 lb 13.1 oz (60.7 kg)   Patient reports to appointment for new patient consultation.  He came in using wheelchair because he was still feeling weak.  Patients wife, cousin are present.  Patient is very concerned about headaches and the pain.  He reports taking Oxycodone 5 mg since being discharged from hospital that is not controlling his pain.  He reports it being a 8 out of 10.

## 2016-06-25 NOTE — Progress Notes (Signed)
Patient is here for HFU  Patient complains of right side head pain being present and scaled at an 8.  Patient has taken medication today. Patient has eaten today.
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
## 2016-06-26 ENCOUNTER — Ambulatory Visit: Payer: Medicaid Other | Admitting: Radiation Oncology

## 2016-06-26 ENCOUNTER — Inpatient Hospital Stay: Admission: RE | Admit: 2016-06-26 | Payer: Self-pay | Source: Ambulatory Visit

## 2016-06-26 ENCOUNTER — Ambulatory Visit: Payer: Medicaid Other

## 2016-06-27 NOTE — Progress Notes (Signed)
[facility] Psychosocial Distress Screening Clinical Social Work  Clinical Social Work was referred by distress screening protocol.  The patient scored a 7 on the Psychosocial Distress Thermometer which indicates moderate distress. Clinical Social Worker met with patient and patient's spouse in radiation oncology exam room to assess for distress and other psychosocial needs. Mr. Willadsen shared his main concerns at this time are financial. Mr. Stclair has been unable to work and his spouse reported she is unable to work due to a disability (she is awaiting approval from [employer].  Patient/spouse live in [place] and have minimal support. CSW briefly reviewed causes of distress, patient reported feeling tired and ready to go home today.  Patient and patient's spouse agreed to follow up with CSW after they have completed PET scan.   Patient needs to complete disability paperwork with [facility], they have contact information. CSW requested referral to financial counselor to review insurance options and apply for [employer].  ONCBCN DISTRESS SCREENING 06/27/2016  Screening Type Initial Screening  Distress experienced in past week (1-10) 7  Practical problem type Housing;Transportation;Food  Emotional problem type Depression;Adjusting to illness;Isolation/feeling alone;Feeling hopeless;Adjusting to appearance changes  Information Concerns Type Lack of info about diagnosis;Lack of info about treatment;Lack of info about complementary therapy choices;Lack of info about maintaining fitness  Physical Problem type Pain;Nausea/vomiting;Sleep/insomnia;Breathing;Constipation/diarrhea;Sexual problems;Skin dry/itchy  Referral to clinical social work Yes    Maryjean Morn, MSW, LCSW, OSW-C Clinical Social Worker [facility] [phone]

## 2016-07-01 ENCOUNTER — Emergency Department (HOSPITAL_COMMUNITY)
Admission: EM | Admit: 2016-07-01 | Discharge: 2016-07-01 | Disposition: A | Discharge status: Home / Self Care | Payer: Medicaid Other | Attending: Emergency Medicine | Admitting: Emergency Medicine

## 2016-07-01 ENCOUNTER — Emergency Department (HOSPITAL_COMMUNITY): Payer: Medicaid Other

## 2016-07-01 ENCOUNTER — Encounter (HOSPITAL_COMMUNITY): Payer: Self-pay

## 2016-07-01 DIAGNOSIS — R079 Chest pain, unspecified: Secondary | ICD-10-CM | POA: Diagnosis present

## 2016-07-01 DIAGNOSIS — Z91040 Latex allergy status: Secondary | ICD-10-CM | POA: Insufficient documentation

## 2016-07-01 DIAGNOSIS — F17200 Nicotine dependence, unspecified, uncomplicated: Secondary | ICD-10-CM | POA: Diagnosis not present

## 2016-07-01 DIAGNOSIS — R0789 Other chest pain: Secondary | ICD-10-CM | POA: Diagnosis not present

## 2016-07-01 LAB — CBC
HCT: 39.8 % (ref 39.0–52.0)
HEMOGLOBIN: 13.6 g/dL (ref 13.0–17.0)
MCH: 29.8 pg (ref 26.0–34.0)
MCHC: 34.2 g/dL (ref 30.0–36.0)
MCV: 87.1 fL (ref 78.0–100.0)
Platelets: 294 10*3/uL (ref 150–400)
RBC: 4.57 MIL/uL (ref 4.22–5.81)
RDW: 13.7 % (ref 11.5–15.5)
WBC: 21.5 10*3/uL — ABNORMAL HIGH (ref 4.0–10.5)

## 2016-07-01 LAB — BASIC METABOLIC PANEL
Anion gap: 10 (ref 5–15)
BUN: 14 mg/dL (ref 6–20)
CO2: 24 mmol/L (ref 22–32)
Calcium: 8.7 mg/dL — ABNORMAL LOW (ref 8.9–10.3)
Chloride: 99 mmol/L — ABNORMAL LOW (ref 101–111)
Creatinine, Ser: 0.89 mg/dL (ref 0.61–1.24)
GFR calc Af Amer: >60 mL/min (ref >60)
GFR calc non Af Amer: >60 mL/min (ref >60)
Glucose, Bld: 153 mg/dL — ABNORMAL HIGH (ref 65–99)
Potassium: 3.7 mmol/L (ref 3.5–5.1)
Sodium: 133 mmol/L — ABNORMAL LOW (ref 135–145)

## 2016-07-01 LAB — I-STAT TROPONIN, ED: TROPONIN I, POC: 0 ng/mL (ref 0.00–0.08)

## 2016-07-01 MED ORDER — OXYCODONE HCL 5 MG PO TABS
10.0000 mg | ORAL_TABLET | Freq: Once | ORAL | Status: AC
  Administered 2016-07-01: 10 mg via ORAL
  Filled 2016-07-01: qty 2

## 2016-07-01 MED ORDER — OXYCODONE HCL 10 MG PO TABS: 10.0000 mg | ORAL_TABLET | Freq: Three times a day (TID) | ORAL | 0 refills | Status: DC | PRN

## 2016-07-01 MED ORDER — GI COCKTAIL ~~LOC~~
30.0000 mL | Freq: Once | ORAL | Status: AC
  Administered 2016-07-01: 30 mL via ORAL
  Filled 2016-07-01: qty 30

## 2016-07-01 MED ORDER — RANITIDINE HCL 75 MG PO TABS: 75.0000 mg | ORAL_TABLET | Freq: Two times a day (BID) | ORAL | 0 refills | Status: DC

## 2016-07-01 NOTE — ED Triage Notes (Signed)
Pt arrives EMS from home where he c/o chest pain since last Tuesday after brain biopsy. "Feels like something moving and makes me short of breath. Pt states he has brain mass and mass at lung.

## 2016-07-01 NOTE — ED Notes (Signed)
Pt states he understands instructions. Home stable with steady gait. 
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
## 2016-07-01 NOTE — Discharge Instructions (Signed)
As discussed, your evaluation today has been largely reassuring.  But, it is important that you monitor your condition carefully, and do not hesitate to return to the ED if you develop new, or concerning changes in your condition. ? ?Otherwise, please follow-up with your physician for appropriate ongoing care. ? ?

## 2016-07-02 ENCOUNTER — Ambulatory Visit: Payer: Medicaid Other | Admitting: Radiation Oncology

## 2016-07-02 ENCOUNTER — Ambulatory Visit: Payer: Self-pay | Admitting: Adult Health

## 2016-07-02 DIAGNOSIS — C7931 Secondary malignant neoplasm of brain: Secondary | ICD-10-CM | POA: Insufficient documentation

## 2016-07-03 ENCOUNTER — Ambulatory Visit (HOSPITAL_COMMUNITY)
Admission: RE | Admit: 2016-07-03 | Discharge: 2016-07-03 | Disposition: A | Discharge status: Home / Self Care | Payer: Medicaid Other | Source: Ambulatory Visit | Attending: Pulmonary Disease | Admitting: Pulmonary Disease

## 2016-07-03 DIAGNOSIS — R911 Solitary pulmonary nodule: Secondary | ICD-10-CM | POA: Diagnosis present

## 2016-07-03 DIAGNOSIS — G588 Other specified mononeuropathies: Secondary | ICD-10-CM | POA: Insufficient documentation

## 2016-07-03 DIAGNOSIS — R918 Other nonspecific abnormal finding of lung field: Secondary | ICD-10-CM | POA: Insufficient documentation

## 2016-07-03 DIAGNOSIS — G939 Disorder of brain, unspecified: Secondary | ICD-10-CM | POA: Diagnosis not present

## 2016-07-03 LAB — GLUCOSE, CAPILLARY: GLUCOSE-CAPILLARY: 109 mg/dL — AB (ref 65–99)

## 2016-07-03 MED ORDER — FLUDEOXYGLUCOSE F - 18 (FDG) INJECTION
6.2800 | Freq: Once | INTRAVENOUS | Status: AC | PRN
  Administered 2016-07-03: 6.28 via INTRAVENOUS

## 2016-07-04 ENCOUNTER — Inpatient Hospital Stay (HOSPITAL_COMMUNITY)
Admission: EM | Admit: 2016-07-04 | Discharge: 2016-07-12 | DRG: 982 | Disposition: A | Discharge status: Home / Self Care | Payer: Medicaid Other | Attending: Student in an Organized Health Care Education/Training Program | Admitting: Student in an Organized Health Care Education/Training Program

## 2016-07-04 ENCOUNTER — Encounter (HOSPITAL_COMMUNITY): Payer: Self-pay

## 2016-07-04 ENCOUNTER — Emergency Department (HOSPITAL_COMMUNITY): Payer: Medicaid Other

## 2016-07-04 ENCOUNTER — Other Ambulatory Visit: Payer: Self-pay

## 2016-07-04 ENCOUNTER — Telehealth: Payer: Self-pay | Admitting: Pulmonary Disease

## 2016-07-04 DIAGNOSIS — E222 Syndrome of inappropriate secretion of antidiuretic hormone: Principal | ICD-10-CM | POA: Diagnosis present

## 2016-07-04 DIAGNOSIS — Z808 Family history of malignant neoplasm of other organs or systems: Secondary | ICD-10-CM

## 2016-07-04 DIAGNOSIS — K209 Esophagitis, unspecified without bleeding: Secondary | ICD-10-CM

## 2016-07-04 DIAGNOSIS — Z91040 Latex allergy status: Secondary | ICD-10-CM

## 2016-07-04 DIAGNOSIS — F17200 Nicotine dependence, unspecified, uncomplicated: Secondary | ICD-10-CM | POA: Diagnosis present

## 2016-07-04 DIAGNOSIS — R569 Unspecified convulsions: Secondary | ICD-10-CM

## 2016-07-04 DIAGNOSIS — R112 Nausea with vomiting, unspecified: Secondary | ICD-10-CM | POA: Diagnosis present

## 2016-07-04 DIAGNOSIS — Z720 Tobacco use: Secondary | ICD-10-CM | POA: Insufficient documentation

## 2016-07-04 DIAGNOSIS — B3781 Candidal esophagitis: Secondary | ICD-10-CM | POA: Diagnosis present

## 2016-07-04 DIAGNOSIS — D496 Neoplasm of unspecified behavior of brain: Secondary | ICD-10-CM

## 2016-07-04 DIAGNOSIS — F1010 Alcohol abuse, uncomplicated: Secondary | ICD-10-CM

## 2016-07-04 DIAGNOSIS — G939 Disorder of brain, unspecified: Secondary | ICD-10-CM

## 2016-07-04 DIAGNOSIS — I10 Essential (primary) hypertension: Secondary | ICD-10-CM | POA: Diagnosis present

## 2016-07-04 DIAGNOSIS — E871 Hypo-osmolality and hyponatremia: Secondary | ICD-10-CM

## 2016-07-04 DIAGNOSIS — C3411 Malignant neoplasm of upper lobe, right bronchus or lung: Secondary | ICD-10-CM | POA: Diagnosis present

## 2016-07-04 DIAGNOSIS — C7931 Secondary malignant neoplasm of brain: Secondary | ICD-10-CM | POA: Diagnosis present

## 2016-07-04 DIAGNOSIS — E860 Dehydration: Secondary | ICD-10-CM

## 2016-07-04 DIAGNOSIS — F17210 Nicotine dependence, cigarettes, uncomplicated: Secondary | ICD-10-CM

## 2016-07-04 DIAGNOSIS — Z8249 Family history of ischemic heart disease and other diseases of the circulatory system: Secondary | ICD-10-CM

## 2016-07-04 DIAGNOSIS — R1310 Dysphagia, unspecified: Secondary | ICD-10-CM

## 2016-07-04 DIAGNOSIS — C3490 Malignant neoplasm of unspecified part of unspecified bronchus or lung: Secondary | ICD-10-CM

## 2016-07-04 LAB — HEPATIC FUNCTION PANEL
ALBUMIN: 3.3 g/dL — AB (ref 3.5–5.0)
ALK PHOS: 64 U/L (ref 38–126)
ALT: 29 U/L (ref 17–63)
AST: 19 U/L (ref 15–41)
BILIRUBIN TOTAL: 1.3 mg/dL — AB (ref 0.3–1.2)
Bilirubin, Direct: 0.2 mg/dL (ref 0.1–0.5)
Indirect Bilirubin: 1.1 mg/dL — ABNORMAL HIGH (ref 0.3–0.9)
Total Protein: 6.8 g/dL (ref 6.5–8.1)

## 2016-07-04 LAB — BASIC METABOLIC PANEL
Anion gap: 12 (ref 5–15)
BUN: 13 mg/dL (ref 6–20)
CHLORIDE: 94 mmol/L — AB (ref 101–111)
CO2: 24 mmol/L (ref 22–32)
CREATININE: 0.7 mg/dL (ref 0.61–1.24)
Calcium: 8.6 mg/dL — ABNORMAL LOW (ref 8.9–10.3)
GFR calc non Af Amer: >60 mL/min (ref >60)
Glucose, Bld: 84 mg/dL (ref 65–99)
Potassium: 3.7 mmol/L (ref 3.5–5.1)
Sodium: 130 mmol/L — ABNORMAL LOW (ref 135–145)

## 2016-07-04 LAB — CBC
HEMATOCRIT: 46.7 % (ref 39.0–52.0)
HEMOGLOBIN: 16.4 g/dL (ref 13.0–17.0)
MCH: 30.1 pg (ref 26.0–34.0)
MCHC: 35.1 g/dL (ref 30.0–36.0)
MCV: 85.8 fL (ref 78.0–100.0)
PLATELETS: 230 10*3/uL (ref 150–400)
RBC: 5.44 MIL/uL (ref 4.22–5.81)
RDW: 13.6 % (ref 11.5–15.5)
WBC: 12.3 10*3/uL — ABNORMAL HIGH (ref 4.0–10.5)

## 2016-07-04 LAB — I-STAT TROPONIN, ED: Troponin i, poc: 0 ng/mL (ref 0.00–0.08)

## 2016-07-04 LAB — LIPASE, BLOOD: Lipase: 24 U/L (ref 11–51)

## 2016-07-04 MED ORDER — ACETAMINOPHEN 325 MG PO TABS: 650.0000 mg | ORAL_TABLET | Freq: Four times a day (QID) | ORAL | Status: DC | PRN

## 2016-07-04 MED ORDER — SODIUM CHLORIDE 0.9 % IV SOLN
INTRAVENOUS | Status: DC
  Administered 2016-07-04 – 2016-07-05 (×2): via INTRAVENOUS

## 2016-07-04 MED ORDER — PANTOPRAZOLE SODIUM 40 MG IV SOLR
40.0000 mg | Freq: Two times a day (BID) | INTRAVENOUS | Status: DC
Start: 2016-07-04 — End: 2016-07-04

## 2016-07-04 MED ORDER — LEVETIRACETAM 500 MG PO TABS
500.0000 mg | ORAL_TABLET | Freq: Two times a day (BID) | ORAL | Status: DC
  Administered 2016-07-04 – 2016-07-12 (×15): 500 mg via ORAL
  Filled 2016-07-04 (×15): qty 1

## 2016-07-04 MED ORDER — SUCRALFATE 1 GM/10ML PO SUSP
1.0000 g | Freq: Three times a day (TID) | ORAL | Status: DC
  Administered 2016-07-04 – 2016-07-06 (×6): 1 g via ORAL
  Filled 2016-07-04 (×8): qty 10

## 2016-07-04 MED ORDER — METOCLOPRAMIDE HCL 5 MG/ML IJ SOLN
10.0000 mg | Freq: Once | INTRAMUSCULAR | Status: AC
  Administered 2016-07-04: 10 mg via INTRAVENOUS
  Filled 2016-07-04: qty 2

## 2016-07-04 MED ORDER — DEXAMETHASONE SODIUM PHOSPHATE 10 MG/ML IJ SOLN
10.0000 mg | Freq: Once | INTRAMUSCULAR | Status: AC
Start: 2016-07-04 — End: 2016-07-04
  Administered 2016-07-04: 10 mg via INTRAVENOUS
  Filled 2016-07-04: qty 1

## 2016-07-04 MED ORDER — ONDANSETRON 4 MG PO TBDP
4.0000 mg | ORAL_TABLET | Freq: Once | ORAL | Status: AC
  Administered 2016-07-04: 4 mg via ORAL

## 2016-07-04 MED ORDER — PANTOPRAZOLE SODIUM 40 MG IV SOLR
40.0000 mg | Freq: Two times a day (BID) | INTRAVENOUS | Status: DC
  Administered 2016-07-05 – 2016-07-09 (×10): 40 mg via INTRAVENOUS
  Filled 2016-07-04 (×10): qty 40

## 2016-07-04 MED ORDER — IOPAMIDOL (ISOVUE-300) INJECTION 61%
INTRAVENOUS | Status: AC
  Administered 2016-07-04: 100 mL
  Filled 2016-07-04: qty 100

## 2016-07-04 MED ORDER — MORPHINE SULFATE (PF) 4 MG/ML IV SOLN
4.0000 mg | Freq: Once | INTRAVENOUS | Status: AC
Start: 2016-07-04 — End: 2016-07-04
  Administered 2016-07-04: 4 mg via INTRAVENOUS
  Filled 2016-07-04: qty 1

## 2016-07-04 MED ORDER — ONDANSETRON HCL 4 MG PO TABS: 4.0000 mg | ORAL_TABLET | Freq: Four times a day (QID) | ORAL | Status: DC | PRN

## 2016-07-04 MED ORDER — MORPHINE SULFATE (PF) 4 MG/ML IV SOLN
2.0000 mg | INTRAVENOUS | Status: DC | PRN
  Administered 2016-07-04 – 2016-07-06 (×8): 2 mg via INTRAVENOUS
  Filled 2016-07-04 (×8): qty 1

## 2016-07-04 MED ORDER — DIPHENHYDRAMINE HCL 50 MG/ML IJ SOLN
25.0000 mg | Freq: Once | INTRAMUSCULAR | Status: AC
  Administered 2016-07-04: 25 mg via INTRAVENOUS
  Filled 2016-07-04: qty 1

## 2016-07-04 MED ORDER — PANTOPRAZOLE SODIUM 40 MG PO TBEC
40.0000 mg | DELAYED_RELEASE_TABLET | Freq: Once | ORAL | Status: AC
  Administered 2016-07-04: 40 mg via ORAL
  Filled 2016-07-04: qty 1

## 2016-07-04 MED ORDER — ONDANSETRON HCL 4 MG/2ML IJ SOLN
4.0000 mg | Freq: Four times a day (QID) | INTRAMUSCULAR | Status: DC | PRN
  Administered 2016-07-05: 4 mg via INTRAVENOUS
  Filled 2016-07-04: qty 2

## 2016-07-04 MED ORDER — ACETAMINOPHEN 650 MG RE SUPP: 650.0000 mg | Freq: Four times a day (QID) | RECTAL | Status: DC | PRN

## 2016-07-04 MED ORDER — FOLIC ACID 1 MG PO TABS
1.0000 mg | ORAL_TABLET | Freq: Every day | ORAL | Status: DC
  Administered 2016-07-05 – 2016-07-12 (×7): 1 mg via ORAL
  Filled 2016-07-04 (×7): qty 1

## 2016-07-04 MED ORDER — ONDANSETRON 4 MG PO TBDP
ORAL_TABLET | ORAL | Status: AC
  Filled 2016-07-04: qty 1

## 2016-07-04 MED ORDER — GI COCKTAIL ~~LOC~~
30.0000 mL | Freq: Once | ORAL | Status: AC
  Administered 2016-07-04: 30 mL via ORAL
  Filled 2016-07-04: qty 30

## 2016-07-04 MED ORDER — GI COCKTAIL ~~LOC~~
30.0000 mL | Freq: Two times a day (BID) | ORAL | Status: DC | PRN
  Administered 2016-07-05: 30 mL via ORAL
  Filled 2016-07-04 (×2): qty 30

## 2016-07-04 MED ORDER — SODIUM CHLORIDE 0.9 % IV BOLUS (SEPSIS)
1000.0000 mL | Freq: Once | INTRAVENOUS | Status: AC
  Administered 2016-07-04: 1000 mL via INTRAVENOUS

## 2016-07-04 MED ORDER — RANITIDINE HCL 150 MG/10ML PO SYRP
150.0000 mg | ORAL_SOLUTION | Freq: Once | ORAL | Status: AC
  Administered 2016-07-04: 150 mg via ORAL
  Filled 2016-07-04: qty 10

## 2016-07-04 MED ORDER — ENOXAPARIN SODIUM 40 MG/0.4ML ~~LOC~~ SOLN
40.0000 mg | SUBCUTANEOUS | Status: DC
  Administered 2016-07-04 – 2016-07-09 (×6): 40 mg via SUBCUTANEOUS
  Filled 2016-07-04 (×6): qty 0.4

## 2016-07-04 NOTE — ED Notes (Signed)
Pt in X-ray, called X-ray to bring pt to A10. Nurse was notified. Family brought back to room.

## 2016-07-04 NOTE — ED Triage Notes (Signed)
PT was recently d/c from inpt here and received radiation for brain tumor yesterday. Last night he began having chest pain and feeling like something was "stuck" in center of chest. Pt reports emesis anytime he tries to drink anything and reports it "wont go down". He reports having a difficult time swallowing spit as well. PT requesting to lay down in triage.

## 2016-07-04 NOTE — Progress Notes (Signed)
Received report from ED RN. Room ready for patient. Aidan Moten Joselita, RN 
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
## 2016-07-04 NOTE — Telephone Encounter (Signed)
    PET scan result : 11 mm RUL lesion was non FDG avid. Additional 5 mm nodule >> too small to be determined if FDG avid or not.  Additionally, there is an left 86m nodule which  Hypermetabolic (5.1 SUV units)  Sarah : This pt was admitted and seen by [place] weeks ago.  He had a nav bronch + biopsy of RUL nodule by Dr. BLamonte Sakai  Results were (-) for CA.  Patient also has a  13 x 11 x 9 mm mass centered at the posterior right temporal lobe with  Mild localized edema without significant mass effect.  Neurosurgery held off on the brain mass biopsy as we worked  pt up.   The RUL nodule could still be a malignancy (I.e adeno CA) even if PET (-) and biopsy is (-).   What do we do next? Pt has a f/u with you this week.   1. pls discuss results  with pt and wife.  2. If you can ask RB or JN or PM or however does EBUS if they can do Bronch and EBUS the L mediastinal node.  That could also be falsely (-) but it is worth the shot. That's the next most accessible lesion to biopsy.  3. You can refer to the tumor boards and they can discuss case in conference.  It is very risk to biopsy the RUL via IR as the nodule is surrounded by emphysema and he will definitely get a PTX.  Maybe they will decide to give radiation to RUL lesion and brain lesion w/o biopsy. They will want EBUS of the LN as well I think.  Or they may opt to just be conservative and rpt chest ct scan in 3 mos.   Thanks!  Pls let me know if there are other questions/concerns. pls keep me posted.    JMonica Becton MD 07/04/2016, 6:47 AM [facility] Pager [phone] After 3 pm or if no answer, call [phone]

## 2016-07-05 ENCOUNTER — Ambulatory Visit: Payer: Self-pay

## 2016-07-05 ENCOUNTER — Encounter: Payer: Self-pay | Admitting: *Deleted

## 2016-07-05 ENCOUNTER — Inpatient Hospital Stay: Payer: Self-pay | Admitting: Acute Care

## 2016-07-05 DIAGNOSIS — E860 Dehydration: Secondary | ICD-10-CM | POA: Diagnosis present

## 2016-07-05 DIAGNOSIS — I10 Essential (primary) hypertension: Secondary | ICD-10-CM | POA: Diagnosis present

## 2016-07-05 DIAGNOSIS — R569 Unspecified convulsions: Secondary | ICD-10-CM | POA: Diagnosis present

## 2016-07-05 DIAGNOSIS — B3781 Candidal esophagitis: Secondary | ICD-10-CM | POA: Diagnosis present

## 2016-07-05 DIAGNOSIS — Z808 Family history of malignant neoplasm of other organs or systems: Secondary | ICD-10-CM | POA: Diagnosis not present

## 2016-07-05 DIAGNOSIS — C3411 Malignant neoplasm of upper lobe, right bronchus or lung: Secondary | ICD-10-CM | POA: Diagnosis present

## 2016-07-05 DIAGNOSIS — E222 Syndrome of inappropriate secretion of antidiuretic hormone: Secondary | ICD-10-CM | POA: Diagnosis present

## 2016-07-05 DIAGNOSIS — Z91040 Latex allergy status: Secondary | ICD-10-CM | POA: Diagnosis not present

## 2016-07-05 DIAGNOSIS — C7931 Secondary malignant neoplasm of brain: Secondary | ICD-10-CM | POA: Diagnosis present

## 2016-07-05 DIAGNOSIS — F17200 Nicotine dependence, unspecified, uncomplicated: Secondary | ICD-10-CM | POA: Diagnosis present

## 2016-07-05 DIAGNOSIS — Z8249 Family history of ischemic heart disease and other diseases of the circulatory system: Secondary | ICD-10-CM | POA: Diagnosis not present

## 2016-07-05 LAB — COMPREHENSIVE METABOLIC PANEL
ALT: 25 U/L (ref 17–63)
AST: 23 U/L (ref 15–41)
Albumin: 2.9 g/dL — ABNORMAL LOW (ref 3.5–5.0)
Alkaline Phosphatase: 56 U/L (ref 38–126)
Anion gap: 9 (ref 5–15)
BUN: 15 mg/dL (ref 6–20)
CHLORIDE: 98 mmol/L — AB (ref 101–111)
CO2: 23 mmol/L (ref 22–32)
CREATININE: 0.79 mg/dL (ref 0.61–1.24)
Calcium: 8.4 mg/dL — ABNORMAL LOW (ref 8.9–10.3)
GFR calc non Af Amer: >60 mL/min (ref >60)
Glucose, Bld: 96 mg/dL (ref 65–99)
POTASSIUM: 4.6 mmol/L (ref 3.5–5.1)
SODIUM: 130 mmol/L — AB (ref 135–145)
Total Bilirubin: 0.9 mg/dL (ref 0.3–1.2)
Total Protein: 6.2 g/dL — ABNORMAL LOW (ref 6.5–8.1)

## 2016-07-05 LAB — CBC
HEMATOCRIT: 44.1 % (ref 39.0–52.0)
HEMOGLOBIN: 15 g/dL (ref 13.0–17.0)
MCH: 29.2 pg (ref 26.0–34.0)
MCHC: 34 g/dL (ref 30.0–36.0)
MCV: 86 fL (ref 78.0–100.0)
Platelets: 229 10*3/uL (ref 150–400)
RBC: 5.13 MIL/uL (ref 4.22–5.81)
RDW: 13.9 % (ref 11.5–15.5)
WBC: 12.6 10*3/uL — AB (ref 4.0–10.5)

## 2016-07-05 LAB — MAGNESIUM: Magnesium: 1.9 mg/dL (ref 1.7–2.4)

## 2016-07-05 MED ORDER — SODIUM CHLORIDE 0.9 % IV SOLN
INTRAVENOUS | Status: AC
  Administered 2016-07-05: 13:00:00 via INTRAVENOUS

## 2016-07-05 NOTE — Progress Notes (Signed)
   Subjective:  Patient was evaluated this morning on rounds. He states that he continues to have trouble swallowing and has pain in his throat.  His symptoms have improved since admission.  He denies difficulty breathing or that his throat is closing up.  He has not tried eating and wants to try today.  Objective:  Vital signs in last 24 hours: Vitals:   07/04/16 2045 07/04/16 2135 07/05/16 0506 07/05/16 0858  BP: 139/87 (!) 156/94 (!) 158/92 (!) 166/89  Pulse:  78 73 69  Resp:  '16 16 17  '$ Temp:  99.1 F (37.3 C) 98.3 F (36.8 C) 99 F (37.2 C)  TempSrc:  Oral Oral Oral  SpO2:  100% 100% 100%  Weight:  120 lb 8 oz (54.7 kg)    Height:  '5\' 10"'$  (1.778 m)     Physical Exam  HENT:  Mouth/Throat: Oropharynx is clear and moist. No oropharyngeal exudate.  Cardiovascular: Normal rate, regular rhythm and normal heart sounds.  Exam reveals no gallop and no friction rub.   No murmur heard. Pulmonary/Chest: Effort normal and breath sounds normal. No respiratory distress. He has no wheezes. He has no rales.    Assessment/Plan:  Principal Problem:   Intractable nausea and vomiting Active Problems:   Seizure ([facility])   Odynophagia   Dehydration  Esophagitis Patient had a CT abdomen and pelvis yesterday which showed a circumferentially edematous esophagus consistent with esophagitis.  It is unclear the etiology of the patient's esophagitis. Symptoms started as soon as he stopped taking Decadron of which he had taken for 2 weeks straight.  High-dose Decadron along with history of tobacco and alcohol abuse may increase the risk of gastritis and esophagitis. Due to steroid use patient is also at risk for esophageal candidiasis and will need to consider EGD if patient does not improve, to rule out infectious etiology.  -- Clear liquid diet -- IV pantoprazole 40 mg twice a day -- Carafate solution 3 times a day -- Zofran when necessary nausea and vomiting -- Morphine 2 mg every 4 hours when  necessary pain -- GI cocktail  Solitary Brain mass Following a previous hospital admission for altered mental status and an unsteady gait the patient was found to have a 1.2 x 1.2 x 1.0 cm right posterior temporal lobe mass on brain MRI and is on Keppra for seizure prophylaxis.  Per oncology nurse navigator note today plan is for brain biopsy. -- Keppra 500 mg twice a day  DVT/PE prophylaxis: Lovenox FEN/GI: Clear liquids Code: Full code  Dispo: Anticipated discharge pending clinical improvement.   Valinda Party, DO 07/05/2016, 12:31 PM Pager: [phone]

## 2016-07-05 NOTE — Progress Notes (Signed)
Oncology Nurse Navigator Documentation  Oncology Nurse Navigator Flowsheets 07/05/2016  Navigator Encounter Type Other/Mr. Faniel's imaging was discussed at cancer conference today.  The group states the best way to get tissue was brian biopsy.  It was felt that due to low FDG of lung nodule and bronchoscopy being non-diagnostic on 06/20/16 that this may not be lung and another biopsy of the lung may negative again. I updated Rad Onc PA-C Shona Simpson.    Treatment Phase Pre-Tx/Tx Discussion  Barriers/Navigation Needs Coordination of Care  Interventions Coordination of Care  Coordination of Care Other  Acuity Level 2  Acuity Level 2 Other  Time Spent with Patient 30

## 2016-07-06 DIAGNOSIS — G9389 Other specified disorders of brain: Secondary | ICD-10-CM

## 2016-07-06 DIAGNOSIS — R1310 Dysphagia, unspecified: Secondary | ICD-10-CM

## 2016-07-06 DIAGNOSIS — B370 Candidal stomatitis: Secondary | ICD-10-CM

## 2016-07-06 LAB — CBC
HCT: 44.1 % (ref 39.0–52.0)
HEMOGLOBIN: 15.1 g/dL (ref 13.0–17.0)
MCH: 29.5 pg (ref 26.0–34.0)
MCHC: 34.2 g/dL (ref 30.0–36.0)
MCV: 86.3 fL (ref 78.0–100.0)
PLATELETS: 193 10*3/uL (ref 150–400)
RBC: 5.11 MIL/uL (ref 4.22–5.81)
RDW: 13.3 % (ref 11.5–15.5)
WBC: 7.6 10*3/uL (ref 4.0–10.5)

## 2016-07-06 LAB — BASIC METABOLIC PANEL
ANION GAP: 7 (ref 5–15)
BUN: 14 mg/dL (ref 6–20)
CALCIUM: 8.5 mg/dL — AB (ref 8.9–10.3)
CO2: 25 mmol/L (ref 22–32)
Chloride: 98 mmol/L — ABNORMAL LOW (ref 101–111)
Creatinine, Ser: 0.8 mg/dL (ref 0.61–1.24)
GFR calc Af Amer: >60 mL/min (ref >60)
Glucose, Bld: 89 mg/dL (ref 65–99)
Potassium: 3.8 mmol/L (ref 3.5–5.1)
SODIUM: 130 mmol/L — AB (ref 135–145)

## 2016-07-06 MED ORDER — GI COCKTAIL ~~LOC~~
30.0000 mL | Freq: Four times a day (QID) | ORAL | Status: DC
  Administered 2016-07-06 – 2016-07-10 (×15): 30 mL via ORAL
  Filled 2016-07-06 (×15): qty 30

## 2016-07-06 MED ORDER — DEXAMETHASONE SODIUM PHOSPHATE 10 MG/ML IJ SOLN
4.0000 mg | Freq: Two times a day (BID) | INTRAMUSCULAR | Status: DC
  Administered 2016-07-06 – 2016-07-07 (×3): 4 mg via INTRAVENOUS
  Filled 2016-07-06 (×4): qty 0.4

## 2016-07-06 MED ORDER — FLUCONAZOLE IN SODIUM CHLORIDE 200-0.9 MG/100ML-% IV SOLN
200.0000 mg | INTRAVENOUS | Status: DC
  Administered 2016-07-06 – 2016-07-11 (×5): 200 mg via INTRAVENOUS
  Filled 2016-07-06 (×9): qty 100

## 2016-07-06 MED ORDER — KETOROLAC TROMETHAMINE 15 MG/ML IJ SOLN
15.0000 mg | Freq: Four times a day (QID) | INTRAMUSCULAR | Status: AC | PRN
  Administered 2016-07-06 – 2016-07-07 (×4): 15 mg via INTRAVENOUS
  Filled 2016-07-06 (×4): qty 1

## 2016-07-06 NOTE — Progress Notes (Signed)
   Subjective: Patient was evaluated this morning on rounds.  He reports continued burning in his esophagus and stomach.  He states the carafate has little benefit.     Objective:  Vital signs in last 24 hours: Vitals:   07/05/16 0858 07/05/16 2100 07/06/16 0443 07/06/16 0856  BP: (!) 166/89 140/89 (!) 142/91 110/72  Pulse: 69 70 91 82  Resp: '17  17 16  '$ Temp: 99 F (37.2 C) 99.2 F (37.3 C) 98.7 F (37.1 C) 98.2 F (36.8 C)  TempSrc: Oral Oral Oral Oral  SpO2: 100% 98% 99% 95%  Weight:  118 lb 3.2 oz (53.6 kg)    Height:       Physical Exam  Constitutional: He is well-developed, well-nourished, and in no distress.  HENT:  Mouth/Throat: Oropharynx is clear and moist. No oropharyngeal exudate.  Cardiovascular: Normal rate, regular rhythm and normal heart sounds.   Pulmonary/Chest: Effort normal and breath sounds normal. No respiratory distress. He has no wheezes. He has no rales.    Assessment/Plan:  Principal Problem:   Intractable nausea and vomiting Active Problems:   Seizure ([place])   Odynophagia   Dehydration  Esophagitis Patient continues to be afebrile since admission.  His WBC is 7.  Patient continues to have burning in his esophagus.  Due to steroid use prior to admission patient is  at risk for esophageal candidiasis.  Discussed case with GI.  Will have patient NPO for possible EGD today.  Patient's friend in the room followed the team out after rounding asking when the patient was going to receive pain medication.  Patient has been receiving morphine since admission and received his last dose at 7:55am this morning.  Patient told me he had not been receiving pain medication.  The morphine has been discontinued.  I would avoid giving IV opioid medication, patient has toradol available.    -NPO -IV pantoprazole 40 mg twice a day -Carafate solution 3 times a day - Zofran when necessary nausea and vomiting - IV toradol -GI cocktail - Appreciate GI's  recommendations  Solitary Brain mass Following a previous hospital admission for altered mental status and an unsteady gait the patient was found to have a 1.2 x 1.2 x 1.0 cm right posterior temporal lobe mass on brain MRI and is on Keppra for seizure prophylaxis.  Per oncology nurse navigator note plan is for brain biopsy.   -Keppra 500 mg twice a day  DVT/PE prophylaxis:Lovenox FEN/GI:NPO, if no EGD can restart Clear liquids Code:Full code  Dispo: Anticipated discharge pending clinical improvement.   Valinda Party, DO 07/06/2016, 12:55 PM Pager: [phone]
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
## 2016-07-07 MED ORDER — KETOROLAC TROMETHAMINE 15 MG/ML IJ SOLN
15.0000 mg | Freq: Four times a day (QID) | INTRAMUSCULAR | Status: DC | PRN
Start: 2016-07-07 — End: 2016-07-08
  Administered 2016-07-07 – 2016-07-08 (×3): 15 mg via INTRAVENOUS
  Filled 2016-07-07 (×3): qty 1

## 2016-07-07 NOTE — Progress Notes (Signed)
Patient seen getting on the [place] elevators.  Patient wearing a gray shirt and PIV NSL.  Security notified.  Security unable to locate patient.  Dr. Gay Filler notified.

## 2016-07-07 NOTE — Progress Notes (Signed)
   Subjective:  Patient continues to have difficulty with oral intake secondary to pain. He says the GI cocktails do provide temporary relief. He understands that there may take some time for empiric Fluconazole to provide relief and is willing to proceed with EGD if necessary.  Objective:  Vital signs in last 24 hours: Vitals:   07/06/16 0856 07/06/16 2030 07/07/16 0504 07/07/16 0943  BP: 110/72 112/68 115/70 117/79  Pulse: 82 85 69 74  Resp: '16 18 16 14  '$ Temp: 98.2 F (36.8 C) 98.1 F (36.7 C) 98.2 F (36.8 C) 98.3 F (36.8 C)  TempSrc: Oral Oral Oral Oral  SpO2: 95% 98% 100% 100%  Weight:  112 lb 4.8 oz (50.9 kg)    Height:       General: resting in bed, no acute disterss HEENT: Oropharynx clear and moist, no exudate or erythema seen Cardiac: RRR, no rubs, murmurs or gallops Pulm: clear to auscultation bilaterally, moving normal volumes of air Abd: soft, nontender, nondistended, BS present Ext: warm and well perfused, no pedal edema Neuro: alert and oriented X3   Assessment/Plan:  Principal Problem:   Odynophagia Active Problems:   Seizure (HCC)   Intractable nausea and vomiting   Dehydration  Esophagitis/Odynophagia: Patient continues to have burning in his esophagus with some relief with GI cocktails. Suspect esophageal candidiasis with steroid use. He has been started on empiric IV Fluconazole, if no improvement after 48 hours he will likely undergo EGD per GI. He does have suspected cancer of unknown primary and EGD may be helpful to assess for upper GI malignancy. - Continue IV Fluconazole 200 mg daily --> possible EGD if no improvement over weekend - IV Pantoprazole 40 mg BID - Zofran prn - GI Cocktail q6h - Clear liquid diet - Appreciate GI's recommendations  Solitary Brain mass Following a previous hospital admission for altered mental status and an unsteady gait the patient was found to have a 1.2 x 1.2 x 1.0 cm right posterior temporal lobe mass on  brain MRI and is on Keppra for seizure prophylaxis. Per oncology nurse navigator note plan is for brain biopsy. Per Dr. Jodene Nam conversation with Rad/Onc, they have recommended continuation of oral dexamethasone. - Dexamethasone 4 mg IV BID - Keppra 500 mg po BID  Dispo: Anticipated discharge in approximately 2-4 day(s).   Zada Finders, MD 07/07/2016, 10:59 AM

## 2016-07-07 NOTE — Progress Notes (Signed)
Was notified by RN that pt was MIA. Reported getting on elevator approx 4:50pm in street closes with IV in place. Security was notified and has been unable to locate the patient. We will plan to hold the bed until midnight in hopes the patient will return to complete treatment and receive appropriate discharge.  UPDATE: Called pt's cell and he answered. Reported that MD told him he could walk so he "went out for a walk." Advised pt not to leave the floor without notifying RN, pt agrees.

## 2016-07-08 LAB — BASIC METABOLIC PANEL
Anion gap: 10 (ref 5–15)
BUN: 19 mg/dL (ref 6–20)
CHLORIDE: 94 mmol/L — AB (ref 101–111)
CO2: 24 mmol/L (ref 22–32)
Calcium: 8.6 mg/dL — ABNORMAL LOW (ref 8.9–10.3)
Creatinine, Ser: 0.73 mg/dL (ref 0.61–1.24)
GFR calc Af Amer: >60 mL/min (ref >60)
GFR calc non Af Amer: >60 mL/min (ref >60)
GLUCOSE: 115 mg/dL — AB (ref 65–99)
POTASSIUM: 4.2 mmol/L (ref 3.5–5.1)
Sodium: 128 mmol/L — ABNORMAL LOW (ref 135–145)

## 2016-07-08 MED ORDER — DEXAMETHASONE SODIUM PHOSPHATE 4 MG/ML IJ SOLN
4.0000 mg | Freq: Two times a day (BID) | INTRAMUSCULAR | Status: DC
  Administered 2016-07-08 – 2016-07-12 (×8): 4 mg via INTRAVENOUS
  Filled 2016-07-08 (×9): qty 1

## 2016-07-08 MED ORDER — KETOROLAC TROMETHAMINE 15 MG/ML IJ SOLN
15.0000 mg | Freq: Once | INTRAMUSCULAR | Status: AC
  Administered 2016-07-08: 15 mg via INTRAVENOUS
  Filled 2016-07-08: qty 1

## 2016-07-08 MED ORDER — PROCHLORPERAZINE EDISYLATE 5 MG/ML IJ SOLN
10.0000 mg | Freq: Once | INTRAMUSCULAR | Status: AC
  Administered 2016-07-08: 10 mg via INTRAVENOUS
  Filled 2016-07-08: qty 2

## 2016-07-08 MED ORDER — OXYCODONE-ACETAMINOPHEN 5-325 MG PO TABS
1.0000 | ORAL_TABLET | Freq: Three times a day (TID) | ORAL | Status: DC | PRN
  Administered 2016-07-08 – 2016-07-09 (×2): 1 via ORAL
  Filled 2016-07-08 (×2): qty 1

## 2016-07-08 MED ORDER — MORPHINE SULFATE (PF) 2 MG/ML IV SOLN
2.0000 mg | Freq: Once | INTRAVENOUS | Status: AC
  Administered 2016-07-08: 2 mg via INTRAVENOUS
  Filled 2016-07-08: qty 1

## 2016-07-08 MED ORDER — DIPHENHYDRAMINE HCL 50 MG/ML IJ SOLN
25.0000 mg | Freq: Once | INTRAMUSCULAR | Status: AC
  Administered 2016-07-08: 25 mg via INTRAVENOUS
  Filled 2016-07-08: qty 1

## 2016-07-08 MED ORDER — OXYCODONE-ACETAMINOPHEN 5-325 MG PO TABS
1.0000 | ORAL_TABLET | Freq: Once | ORAL | Status: AC
  Administered 2016-07-08: 1 via ORAL
  Filled 2016-07-08: qty 1

## 2016-07-08 NOTE — Progress Notes (Signed)
   Subjective: Patient was evaluated this morning.  He reports continued burning in his throat and has not been able to eat or drink due to pain.      Objective:  Vital signs in last 24 hours: Vitals:   07/07/16 0943 07/07/16 1644 07/07/16 2000 07/08/16 0436  BP: 117/79 (!) 156/89 (!) 180/97 (!) 141/84  Pulse: 74 77 73 62  Resp: '14 16 16 16  '$ Temp: 98.3 F (36.8 C) 98.4 F (36.9 C) 98 F (36.7 C) 97.7 F (36.5 C)  TempSrc: Oral Oral Oral Axillary  SpO2: 100% 100% 100% 100%  Weight:   111 lb (50.3 kg)   Height:       Physical Exam  Constitutional: He is well-developed, well-nourished, and in no distress.  HENT:  Mouth/Throat: Oropharynx is clear and moist. No oropharyngeal exudate.  Pulmonary/Chest: Effort normal. No respiratory distress.  Neurological: He is alert.  Skin: Skin is warm and dry.     Assessment/Plan:  Principal Problem:   Odynophagia Active Problems:   Seizure (HCC)   Intractable nausea and vomiting   Dehydration  Esophagitis/Odynophagia: Patient continues to have burning in his esophagus.  Suspect esophageal candidiasis with steroid use. He has been started on empiric IV Fluconazole. Because he continues to have pain he will likely need an EGD per GI. He does have suspected cancer of unknown primary and EGD may be helpful to assess for upper GI malignancy. - Continue IV Fluconazole 200 mg daily --> possible EGD if no improvement over weekend - IV Pantoprazole 40 mg BID - Zofran prn - GI Cocktail q6h - Clear liquid diet - Appreciate GI's recommendations   Solitary Brain mass Following a previous hospital admission for altered mental status and an unsteady gait the patient was found to have a 1.2 x 1.2 x 1.0 cm right posterior temporal lobe mass on brain MRI and is on Keppra for seizure prophylaxis. Per oncology nurse navigator note plan is for brain biopsy.  - Dexamethasone 4 mg IV BID - Keppra 500 mg po BID - Migraine cocktail of (Toradol,  benadryl and compazine)  Dispo: Anticipated discharge pending EGD.   Valinda Party, DO 07/08/2016, 7:32 AM Pager: [phone]

## 2016-07-09 ENCOUNTER — Encounter: Payer: Self-pay | Admitting: *Deleted

## 2016-07-09 ENCOUNTER — Encounter (HOSPITAL_COMMUNITY): Payer: Self-pay | Admitting: *Deleted

## 2016-07-09 ENCOUNTER — Inpatient Hospital Stay (HOSPITAL_COMMUNITY): Payer: Medicaid Other | Admitting: Certified Registered Nurse Anesthetist

## 2016-07-09 ENCOUNTER — Encounter (HOSPITAL_COMMUNITY)
Admission: EM | Disposition: A | Discharge status: Home / Self Care | Payer: Self-pay | Source: Home / Self Care | Attending: Internal Medicine

## 2016-07-09 ENCOUNTER — Telehealth: Payer: Self-pay

## 2016-07-09 DIAGNOSIS — K209 Esophagitis, unspecified without bleeding: Secondary | ICD-10-CM

## 2016-07-09 DIAGNOSIS — I10 Essential (primary) hypertension: Secondary | ICD-10-CM

## 2016-07-09 HISTORY — PX: ESOPHAGOGASTRODUODENOSCOPY: SHX5428

## 2016-07-09 SURGERY — EGD (ESOPHAGOGASTRODUODENOSCOPY)
Anesthesia: Monitor Anesthesia Care

## 2016-07-09 MED ORDER — LACTATED RINGERS IV SOLN
INTRAVENOUS | Status: DC
  Administered 2016-07-09: 13:00:00 via INTRAVENOUS

## 2016-07-09 MED ORDER — LIDOCAINE 2% (20 MG/ML) 5 ML SYRINGE
INTRAMUSCULAR | Status: DC | PRN
  Administered 2016-07-09: 40 mg via INTRAVENOUS

## 2016-07-09 MED ORDER — DIPHENHYDRAMINE HCL 50 MG/ML IJ SOLN
25.0000 mg | Freq: Once | INTRAMUSCULAR | Status: AC
  Administered 2016-07-09: 25 mg via INTRAVENOUS
  Filled 2016-07-09: qty 1

## 2016-07-09 MED ORDER — HYDRALAZINE HCL 20 MG/ML IJ SOLN: 5.0000 mg | Freq: Once | INTRAMUSCULAR | Status: DC

## 2016-07-09 MED ORDER — DEXTROSE-NACL 5-0.45 % IV SOLN
INTRAVENOUS | Status: DC
  Administered 2016-07-09: 11:00:00 via INTRAVENOUS

## 2016-07-09 MED ORDER — SODIUM CHLORIDE 0.9 % IV SOLN: INTRAVENOUS | Status: DC

## 2016-07-09 MED ORDER — GLYCOPYRROLATE 0.2 MG/ML IV SOSY
PREFILLED_SYRINGE | INTRAVENOUS | Status: DC | PRN
  Administered 2016-07-09: .2 mg via INTRAVENOUS

## 2016-07-09 MED ORDER — MORPHINE SULFATE (PF) 2 MG/ML IV SOLN
1.0000 mg | Freq: Once | INTRAVENOUS | Status: AC
  Administered 2016-07-09: 1 mg via INTRAVENOUS
  Filled 2016-07-09: qty 1

## 2016-07-09 MED ORDER — MORPHINE SULFATE (PF) 2 MG/ML IV SOLN
1.0000 mg | INTRAVENOUS | Status: AC | PRN
  Administered 2016-07-09 – 2016-07-10 (×2): 1 mg via INTRAVENOUS
  Filled 2016-07-09 (×2): qty 1

## 2016-07-09 MED ORDER — PROCHLORPERAZINE EDISYLATE 5 MG/ML IJ SOLN
10.0000 mg | Freq: Once | INTRAMUSCULAR | Status: AC
  Administered 2016-07-09: 10 mg via INTRAVENOUS
  Filled 2016-07-09: qty 2

## 2016-07-09 MED ORDER — PROPOFOL 10 MG/ML IV BOLUS
INTRAVENOUS | Status: DC | PRN
Start: 2016-07-09 — End: 2016-07-09
  Administered 2016-07-09 (×2): 20 mg via INTRAVENOUS
  Administered 2016-07-09: 10 mg via INTRAVENOUS
  Administered 2016-07-09: 20 mg via INTRAVENOUS

## 2016-07-09 MED ORDER — KETOROLAC TROMETHAMINE 15 MG/ML IJ SOLN
15.0000 mg | Freq: Once | INTRAMUSCULAR | Status: AC
  Administered 2016-07-09: 15 mg via INTRAVENOUS
  Filled 2016-07-09: qty 1

## 2016-07-09 MED ORDER — PROPOFOL 500 MG/50ML IV EMUL
INTRAVENOUS | Status: DC | PRN
  Administered 2016-07-09: 100 ug/kg/min via INTRAVENOUS

## 2016-07-09 NOTE — Progress Notes (Signed)
Patient's BP 160/102 HR 75. Patient asymptomatic,denies any c/o. Taylor,MD notified. Will continue to monitor,to let MD know if BP goes to 180. Yuchen Fedor, Wonda Cheng, Therapist, sports

## 2016-07-09 NOTE — Progress Notes (Addendum)
Patient back from EGD alert and oriented.  Requested to go off unit to cafeteria.   Called MD and received order for off unit privileges.   Will continue to monitor   1800 pt walked down to cafeteria and is back in the room.   Paulla Fore, RN

## 2016-07-09 NOTE — Transfer of Care (Signed)
Immediate Anesthesia Transfer of Care Note  Patient: Eric Mason  Procedure(s) Performed: Procedure(s): ESOPHAGOGASTRODUODENOSCOPY (EGD) (N/A)  Patient Location: Endoscopy Unit  Anesthesia Type:MAC  Level of Consciousness: awake and alert   Airway & Oxygen Therapy: Patient Spontanous Breathing  Post-op Assessment: Report given to RN and Post -op Vital signs reviewed and stable  Post vital signs: Reviewed and stable  Last Vitals:  Vitals:   07/09/16 1142 07/09/16 1258  BP: 130/90 (!) 149/107  Pulse:    Resp:  14  Temp:  36.8 C    Last Pain:  Vitals:   07/09/16 1258  TempSrc: Oral  PainSc:       Patients Stated Pain Goal: 0 (74/82/70 [id number])  Complications: No apparent anesthesia complications

## 2016-07-09 NOTE — Telephone Encounter (Signed)
Wife called upset and not knowing who to call. She stated pt saw Dr Lisbeth Renshaw and Worthy Flank and they put pt on oxycodone and fentanyl patches. The pt went into hospital and the hospital took him off his pain meds. She fussed this morning and they started him on some morphine.  He is hypertensive at 183/102 and she said they "are monitoring it" and not giving him any BP medicine. She is worried about a stroke or MI. He is having pain, bad head aches, chest pain and mouth pain. He cannot eat d/t mouth pain. (He is on diflucan IV). She is upset b/c he is in pain and hypertensive and they don't seem to be doing anything for it.   lvm with [facility]izzard navigator and forwarded this message to Dr Lisbeth Renshaw

## 2016-07-09 NOTE — Progress Notes (Signed)
   Subjective: Patient was evaluated this morning on rounds. He reports continued burning in his throat and chest.    Objective:  Vital signs in last 24 hours: Vitals:   07/08/16 1740 07/08/16 2151 07/09/16 0548 07/09/16 0955  BP: (!) 141/75 (!) 167/95 (!) 134/96 (!) 183/102  Pulse: 65 70 69 69  Resp: '16 16 16 18  '$ Temp: 98.8 F (37.1 C) 99.4 F (37.4 C) 97.5 F (36.4 C) 98.6 F (37 C)  TempSrc: Oral Oral Oral Oral  SpO2: 100% 97% 100% 100%  Weight:  111 lb 3 oz (50.4 kg)    Height:       Physical Exam  Constitutional:  Thin  HENT:  Mouth/Throat: Oropharynx is clear and moist. No oropharyngeal exudate.  Cardiovascular: Normal rate, regular rhythm and normal heart sounds.  Exam reveals no gallop and no friction rub.   No murmur heard. Pulmonary/Chest: Effort normal. No respiratory distress. He has no wheezes. He has no rales.  Musculoskeletal: He exhibits no edema.  Neurological: He is alert.  Skin: Skin is warm and dry.     Assessment/Plan:  Principal Problem:   Odynophagia Active Problems:   Seizure (HCC)   Intractable nausea and vomiting   Dehydration  Esophagitis/Odynophagia: Patient continues to have burning in his esophagus.  He has been started on empiric IV Fluconazole with no benefit in symptoms over the weekend. Because he continues to have pain he will likely need an EGD per GI. He does have suspected cancer of unknown primary and EGD may be helpful to assess for upper GI malignancy.  Patient's pain is not controlled with GI cocktail or percocet.  He received morphine overnight.  Patient states only morphine helps.   - Continue IV Fluconazole 200 mg daily  - IV Pantoprazole 40 mg BID - Zofran prn - GI Cocktail q6h - Clear liquid diet - Appreciate GI's recommendations - morphine '1mg'$  Q4Hprn    Solitary Brain mass Following a previous hospital admission for altered mental status and an unsteady gait the patient was found to have a 1.2 x 1.2 x 1.0 cm  right posterior temporal lobe mass on brain MRI and is on Keppra for seizure prophylaxis. Per oncology nurse navigator note plan is for brain biopsy.  - Dexamethasone 4 mg IV BID - Keppra 500 mg po BID - Migraine cocktail of (Toradol, benadryl and compazine)  Hypertension  Patient does not have blood pressure medications at home.  Patient's blood pressures have been elevated during admission (140s-180s/80s-100).   I suspect he has had elevated pressures for a while.  Patient will need outpatient follow up for blood pressure management.  Will give hydral '5mg'$  for blood pressure >180/90.     - hydral '5mg'$  prn for >180/90   Dispo: Anticipated discharge pending EGD and results.   Valinda Party, DO 07/09/2016, 10:05 AM Pager: [phone]

## 2016-07-09 NOTE — Progress Notes (Signed)
Upon initial assessment of patient, he is complaining of burning chest pain.  He states the percocet he received at [facility] is not helping at all.  Called MD and made aware.  Received order for Percocet which was given at 2126.  Upon reassessment, patient is still complaining of burning chest pain which he rates as 8/10.  MD made aware.  Order for 2 mg IV Morphine received which was given at 2334.  Upon reassessment, patient is sleeping and resting comfortably.  Will continue to monitor patient.  Earleen Reaper RN-BC, [employer]

## 2016-07-09 NOTE — Progress Notes (Signed)
          Daily Rounding Note  07/09/2016, 11:22 AM  LOS: 4 days   SUBJECTIVE:   Chief complaint:     Pain with swallowing, even water, persists.    OBJECTIVE:         Vital signs in last 24 hours:    Temp:  [97.5 F (36.4 C)-99.4 F (37.4 C)] 98.6 F (37 C) (04/30 0955) Pulse Rate:  [65-70] 69 (04/30 0955) Resp:  [16-18] 18 (04/30 0955) BP: (134-183)/(75-102) 163/100 (04/30 1006) SpO2:  [97 %-100 %] 100 % (04/30 0955) Weight:  [50.4 kg (111 lb 3 oz)] 50.4 kg (111 lb 3 oz) (04/29 2151) Last BM Date: 06/21/16 Filed Weights   07/06/16 2030 07/07/16 2000 07/08/16 2151  Weight: 50.9 kg (112 lb 4.8 oz) 50.3 kg (111 lb) 50.4 kg (111 lb 3 oz)   General: looks tired, not acutely ill looking   Mouth: white coating on tongue improved but still present.   Heart: RRR Chest: clear bil.  No labored breathing or cough Abdomen: soft, NT  Extremities: no CCE Neuro/Psych:  Affect flat, calm.   No gross deficits  Intake/Output from previous day: 04/29 0701 - 04/30 0700 In: 480 [P.O.:480] Out: 300 [Urine:300]  Intake/Output this shift: No intake/output data recorded.  Lab Results: No results for input(s): WBC, HGB, HCT, PLT in the last 72 hours. BMET  Recent Labs  07/08/16 0500  NA 128*  K 4.2  CL 94*  CO2 24  GLUCOSE 115*  BUN 19  CREATININE 0.73  CALCIUM 8.6*   LFT No results for input(s): PROT, ALBUMIN, AST, ALT, ALKPHOS, BILITOT, BILIDIR, IBILI in the last 72 hours. PT/INR No results for input(s): LABPROT, INR in the last 72 hours. Hepatitis Panel No results for input(s): HEPBSAG, HCVAB, HEPAIGM, HEPBIGM in the last 72 hours.  Studies/Results: No results found.   Scheduled Meds: . dexamethasone  4 mg Intravenous Q12H  . enoxaparin (LOVENOX) injection  40 mg Subcutaneous Q24H  . folic acid  1 mg Oral Daily  . gi cocktail  30 mL Oral QID  . levETIRAcetam  500 mg Oral BID  . pantoprazole (PROTONIX) IV  40 mg  Intravenous Q12H   Continuous Infusions: . dextrose 5 % and 0.45% NaCl 100 mL/hr at 07/09/16 1120  . fluconazole (DIFLUCAN) IV Stopped (07/08/16 1804)   PRN Meds:.acetaminophen **OR** acetaminophen, gi cocktail, morphine injection, ondansetron **OR** ondansetron (ZOFRAN) IV   ASSESMENT:   *  Odynophagia.   Persists despite day 3 Diflucan and PPI BID.    *  Hypertension.  No BP meds on board or PRN.    *  RUL lung mass, Brain mass and recent oncet seizures.  Back on steroids.     PLAN   *  EGD today.    * Spoke with resident to ask that they address the hypertension.      Eric Mason  07/09/2016, 11:22 AM Pager: [phone]

## 2016-07-09 NOTE — Anesthesia Postprocedure Evaluation (Signed)
Anesthesia Post Note  Patient: Eric Mason  Procedure(s) Performed: Procedure(s) (LRB): ESOPHAGOGASTRODUODENOSCOPY (EGD) (N/A)  Patient location during evaluation: PACU Anesthesia Type: MAC Level of consciousness: awake Pain management: pain level controlled Vital Signs Assessment: post-procedure vital signs reviewed and stable Respiratory status: spontaneous breathing Cardiovascular status: stable Anesthetic complications: no       Last Vitals:  Vitals:   07/09/16 1142 07/09/16 1258  BP: 130/90 (!) 149/107  Pulse:    Resp:  14  Temp:  36.8 C    Last Pain:  Vitals:   07/09/16 1258  TempSrc: Oral  PainSc:                  [name]

## 2016-07-09 NOTE — Anesthesia Preprocedure Evaluation (Signed)
Anesthesia Evaluation  Patient identified by MRN, date of birth, ID band Patient awake    Reviewed: Allergy & Precautions, H&P , NPO status , Patient's Chart, lab work & pertinent test results  Airway Mallampati: II   Neck ROM: full    Dental   Pulmonary Current Smoker,  Lung nodules   breath sounds clear to auscultation       Cardiovascular negative cardio ROS   Rhythm:regular Rate:Normal     Neuro/Psych Seizures -,  Right temporal lobe mass    GI/Hepatic odynophagia   Endo/Other    Renal/GU      Musculoskeletal   Abdominal   Peds  Hematology   Anesthesia Other Findings   Reproductive/Obstetrics                             Anesthesia Physical Anesthesia Plan  ASA: II  Anesthesia Plan: MAC   Post-op Pain Management:    Induction: Intravenous  Airway Management Planned: Nasal Cannula  Additional Equipment:   Intra-op Plan:   Post-operative Plan:   Informed Consent: I have reviewed the patients History and Physical, chart, labs and discussed the procedure including the risks, benefits and alternatives for the proposed anesthesia with the patient or authorized representative who has indicated his/her understanding and acceptance.     Plan Discussed with: CRNA, Anesthesiologist and Surgeon  Anesthesia Plan Comments:         Anesthesia Quick Evaluation

## 2016-07-09 NOTE — Op Note (Signed)
[facility] Patient Name: Eric Mason Procedure Date : 07/09/2016 MRN: [id number] Attending MD: Jerene Bears , MD Date of Birth: August 24, 1970 CSN: [id number] Age: 46 Admit Type: Inpatient Procedure:                Upper GI endoscopy Indications:              Odynophagia, history metastatic malignancy on                            corticosteroids, no improvement with empiric                            antifungal therapy Providers:                Lajuan Lines. Hilarie Fredrickson, MD, Cleda Daub, RN, Tinnie Gens,                            Technician, Cletis Athens, Technician Referring MD:             Aldine Contes Medicines:                Monitored Anesthesia Care Complications:            No immediate complications. Estimated Blood Loss:     Estimated blood loss was minimal. Procedure:                Pre-Anesthesia Assessment:                           - Prior to the procedure, a History and Physical                            was performed, and patient medications and                            allergies were reviewed. The patient's tolerance of                            previous anesthesia was also reviewed. The risks                            and benefits of the procedure and the sedation                            options and risks were discussed with the patient.                            All questions were answered, and informed consent                            was obtained. Prior Anticoagulants: The patient has                            taken no previous anticoagulant or antiplatelet  agents. ASA Grade Assessment: III - A patient with                            severe systemic disease. After reviewing the risks                            and benefits, the patient was deemed in                            satisfactory condition to undergo the procedure.                           After obtaining informed consent, the endoscope was                  passed under direct vision. Throughout the                            procedure, the patient's blood pressure, pulse, and                            oxygen saturations were monitored continuously. The                            EG-[id number] ([id number]) scope was introduced through the                            mouth, and advanced to the second part of duodenum.                            The upper GI endoscopy was accomplished without                            difficulty. The patient tolerated the procedure                            well. Scope In: Scope Out: Findings:      Moderately severe esophagitis with no active bleeding was found in the       entire esophagus. This is characterized by granularity, friablilty and       superficial ulcerations. Multiple biopsies were taken with a cold       forceps for histology (from ulceration edge, center and more normal       appearing areas of esophageal mucosa; rule out viral esophagitis).      The cardia and gastric fundus were normal on retroflexion.      The entire examined stomach was normal.      The examined duodenum was normal. Impression:               - Moderately severe acute esophagitis. Biopsied.                           - Normal stomach.                           - Normal examined duodenum. Moderate Sedation:  N/A Recommendation:           - Return patient to hospital ward for ongoing care.                           - Full liquid diet.                           - Advance diet as tolerated.                           - Continue present medications including BID PPI,                            empiric antifungal therapy while pathology pending.                           - Await pathology results (GI will be available                            while waiting on pathology). Procedure Code(s):        --- Professional ---                           [phone], Esophagogastroduodenoscopy, flexible,                             transoral; with biopsy, single or multiple Diagnosis Code(s):        --- Professional ---                           K20.9, Esophagitis, unspecified                           R13.10, Dysphagia, unspecified CPT copyright 2016 American Medical Association. All rights reserved. The codes documented in this report are preliminary and upon coder review may  be revised to meet current compliance requirements. Jerene Bears, MD 07/09/2016 3:03:14 PM This report has been signed electronically. Number of Addenda: 0

## 2016-07-09 NOTE — Progress Notes (Signed)
Oncology Nurse Navigator Documentation  Oncology Nurse Navigator Flowsheets 07/09/2016  Navigator Encounter Type Other/I received a vm message from triage.  Apparently, Ms. Vespa is upset regarding her husbands care.  Patient is not established with me.  I will update Theressa Millard PA-C.    Barriers/Navigation Needs Coordination of Care  Interventions Coordination of Care  Acuity Level 2  Time Spent with Patient 30

## 2016-07-10 ENCOUNTER — Inpatient Hospital Stay (HOSPITAL_COMMUNITY): Payer: Medicaid Other

## 2016-07-10 ENCOUNTER — Ambulatory Visit: Payer: Self-pay | Admitting: Family Medicine

## 2016-07-10 ENCOUNTER — Other Ambulatory Visit: Payer: Self-pay | Admitting: Neurological Surgery

## 2016-07-10 DIAGNOSIS — E871 Hypo-osmolality and hyponatremia: Secondary | ICD-10-CM

## 2016-07-10 LAB — BASIC METABOLIC PANEL
ANION GAP: 13 (ref 5–15)
Anion gap: 7 (ref 5–15)
BUN: 19 mg/dL (ref 6–20)
BUN: 16 mg/dL (ref 6–20)
CALCIUM: 8.5 mg/dL — AB (ref 8.9–10.3)
CHLORIDE: 93 mmol/L — AB (ref 101–111)
CO2: 20 mmol/L — ABNORMAL LOW (ref 22–32)
CO2: 27 mmol/L (ref 22–32)
Calcium: 8.6 mg/dL — ABNORMAL LOW (ref 8.9–10.3)
Chloride: 95 mmol/L — ABNORMAL LOW (ref 101–111)
Creatinine, Ser: 0.7 mg/dL (ref 0.61–1.24)
Creatinine, Ser: 0.77 mg/dL (ref 0.61–1.24)
GFR calc Af Amer: >60 mL/min (ref >60)
GLUCOSE: 105 mg/dL — AB (ref 65–99)
GLUCOSE: 100 mg/dL — AB (ref 65–99)
POTASSIUM: 3.6 mmol/L (ref 3.5–5.1)
Potassium: 4 mmol/L (ref 3.5–5.1)
Sodium: 128 mmol/L — ABNORMAL LOW (ref 135–145)
Sodium: 127 mmol/L — ABNORMAL LOW (ref 135–145)

## 2016-07-10 LAB — CBC
HCT: 42.6 % (ref 39.0–52.0)
Hemoglobin: 14.8 g/dL (ref 13.0–17.0)
MCH: 29.8 pg (ref 26.0–34.0)
MCHC: 34.7 g/dL (ref 30.0–36.0)
MCV: 85.9 fL (ref 78.0–100.0)
Platelets: 162 10*3/uL (ref 150–400)
RBC: 4.96 MIL/uL (ref 4.22–5.81)
RDW: 12.6 % (ref 11.5–15.5)
WBC: 5.5 10*3/uL (ref 4.0–10.5)

## 2016-07-10 LAB — OSMOLALITY, URINE: Osmolality, Ur: 812 mOsm/kg (ref 300–900)

## 2016-07-10 LAB — PROTIME-INR
INR: 0.94
Prothrombin Time: 12.5 seconds (ref 11.4–15.2)

## 2016-07-10 LAB — TYPE AND SCREEN
ABO/RH(D): O POS
Antibody Screen: NEGATIVE

## 2016-07-10 LAB — APTT: aPTT: 25 seconds (ref 24–36)

## 2016-07-10 LAB — OSMOLALITY: Osmolality: 278 mOsm/kg (ref 275–295)

## 2016-07-10 LAB — ABO/RH: ABO/RH(D): O POS

## 2016-07-10 MED ORDER — GADOBENATE DIMEGLUMINE 529 MG/ML IV SOLN
10.0000 mL | Freq: Once | INTRAVENOUS | Status: AC | PRN
  Administered 2016-07-10: 10 mL via INTRAVENOUS

## 2016-07-10 MED ORDER — LIDOCAINE VISCOUS 2 % MT SOLN
15.0000 mL | OROMUCOSAL | Status: DC | PRN
  Administered 2016-07-10 – 2016-07-11 (×4): 15 mL via OROMUCOSAL
  Filled 2016-07-10 (×6): qty 15

## 2016-07-10 MED ORDER — CEFAZOLIN SODIUM-DEXTROSE 2-4 GM/100ML-% IV SOLN
2.0000 g | INTRAVENOUS | Status: AC
  Administered 2016-07-11: 2 g via INTRAVENOUS
  Filled 2016-07-10: qty 100

## 2016-07-10 MED ORDER — PANTOPRAZOLE SODIUM 40 MG PO TBEC
40.0000 mg | DELAYED_RELEASE_TABLET | Freq: Two times a day (BID) | ORAL | Status: DC
  Administered 2016-07-10 – 2016-07-12 (×4): 40 mg via ORAL
  Filled 2016-07-10 (×4): qty 1

## 2016-07-10 MED ORDER — MORPHINE SULFATE (PF) 2 MG/ML IV SOLN
2.0000 mg | Freq: Once | INTRAVENOUS | Status: AC
  Administered 2016-07-10: 2 mg via INTRAVENOUS
  Filled 2016-07-10: qty 1

## 2016-07-10 NOTE — Progress Notes (Signed)
Call made to on call MD,patient still c/o chest pain,aching,denies any pressure.Patient says the same pain when he first came. Taylor,MD made aware that morphine was dc'd sometime tonight. MD to place order for one time dose of morphine. Will continue to monitor. Edrian Melucci, Wonda Cheng, Therapist, sports

## 2016-07-10 NOTE — Consult Note (Signed)
After prolonged discussion and strategizing with radiation oncology we have decided I should resect this right temporal mass prior to radiation. Eric Mason has occasional headaches. He is neurologically intact to objective testing. I am obtaining a new MRI with brainlab protocol tonight. Plan right temporooccipital craniotomy for tumor resection with stereotactic navigation tomorrow.

## 2016-07-10 NOTE — Telephone Encounter (Signed)
I called and LM for the wife. He's on IV hydralzine now so that looks good! Thanks Bryson Ha

## 2016-07-10 NOTE — Progress Notes (Signed)
Patient went off the unit. Patient advised not to stay for more than 30 minutes. 0630 Patient back on the unit. Jamielyn Petrucci, Wonda Cheng, Therapist, sports

## 2016-07-10 NOTE — Progress Notes (Signed)
   Subjective: Mr. Duran was seen and evaluated today at bedside. He was eating breakfast without distress. Feels a little better since admission however continues to complaint of significant pain in his throat/chest with swallowing.  Objective:  Vital signs in last 24 hours: Vitals:   07/09/16 2227 07/10/16 0116 07/10/16 0328 07/10/16 0511  BP: (!) 160/102 (!) 168/108 (!) 146/97 111/78  Pulse: 75 89 84 70  Resp:    14  Temp:    98.6 F (37 C)  TempSrc:      SpO2:    99%  Weight:      Height:       Physical Exam  Constitutional: No distress.  Very thin  HENT:  Head: Normocephalic and atraumatic.  Mouth/Throat: Oropharynx is clear and moist. No oropharyngeal exudate.  Cardiovascular: Normal rate, regular rhythm and normal heart sounds.  Exam reveals no gallop and no friction rub.   No murmur heard. Pulmonary/Chest: Effort normal and breath sounds normal. No respiratory distress. He has no wheezes. He has no rales.  Abdominal: Soft. He exhibits no distension.  Musculoskeletal: He exhibits no edema.  Skin: Skin is warm and dry. He is not diaphoretic.   Assessment/Plan:  Principal Problem:   Odynophagia Active Problems:   Seizure (HCC)   Intractable nausea and vomiting   Dehydration   Acute esophagitis  Esophagitis/Odynophagia: With continued symptoms however tolerated a bit of his breakfast today. Still not with adequate PO intake. EGD yesterday with moderately-severe esophagitis throughout with superficial ulcerations raising concern for viral/?CMV esophagitis. Biopsies were obtained, will hopefully determine etiology. GI recs continuing fluconazole, BID PPI. -Soft diet -BID PO protonix -Fluconazole -Viscous lidocaine -Pt still requiring morphine -Zofran prn -GI Cocktail PRN -Appreciate GI recs  Hyponatremia: 133 on admission, now 128. ?Low-solute intake vs SIADH in this patient with known mass.  -Serum osmolality 278 -Urine osm pending  Solitary Brain mass  Right posterior temporal lobe mass on brain MRI. On Keppra and dexamethasone. D/w neurosurgery, to have craniotomy tomorrow and they will be getting repeat MRI.  -Craniotomy tomorrow with neurosurg, appreciate recs -Dexamethasone 4 mg IV BID -Keppra 500 mg po BID  Hypertension Will need outpatient follow up for blood pressure management. - hydral '5mg'$  prn for >180/90   Dispo: Anticipated discharge pending adequate PO intake and better pain control.    Ricard Faulkner, DO 07/10/2016, 8:26 AM Pager: [phone]

## 2016-07-11 ENCOUNTER — Encounter (HOSPITAL_COMMUNITY)
Admission: EM | Disposition: A | Discharge status: Home / Self Care | Payer: Self-pay | Source: Home / Self Care | Attending: Internal Medicine

## 2016-07-11 ENCOUNTER — Encounter (HOSPITAL_COMMUNITY): Payer: Self-pay | Admitting: *Deleted

## 2016-07-11 ENCOUNTER — Inpatient Hospital Stay (HOSPITAL_COMMUNITY): Payer: Medicaid Other | Admitting: Anesthesiology

## 2016-07-11 HISTORY — PX: APPLICATION OF CRANIAL NAVIGATION: SHX6578

## 2016-07-11 HISTORY — PX: CRANIOTOMY: SHX93

## 2016-07-11 LAB — TSH: TSH: 0.521 u[IU]/mL (ref 0.350–4.500)

## 2016-07-11 LAB — SURGICAL PCR SCREEN
MRSA, PCR: NEGATIVE
Staphylococcus aureus: NEGATIVE

## 2016-07-11 SURGERY — CRANIOTOMY TUMOR EXCISION
Anesthesia: General | Site: Head

## 2016-07-11 MED ORDER — ROCURONIUM BROMIDE 10 MG/ML (PF) SYRINGE
PREFILLED_SYRINGE | INTRAVENOUS | Status: AC
  Filled 2016-07-11: qty 10

## 2016-07-11 MED ORDER — HYDROMORPHONE HCL 1 MG/ML IJ SOLN
0.2500 mg | INTRAMUSCULAR | Status: DC | PRN
  Administered 2016-07-11 (×2): 0.5 mg via INTRAVENOUS

## 2016-07-11 MED ORDER — ARTIFICIAL TEARS OPHTHALMIC OINT
TOPICAL_OINTMENT | OPHTHALMIC | Status: DC | PRN
  Administered 2016-07-11: 1 via OPHTHALMIC

## 2016-07-11 MED ORDER — SUGAMMADEX SODIUM 200 MG/2ML IV SOLN
INTRAVENOUS | Status: AC
Start: 2016-07-11 — End: 2016-07-11
  Filled 2016-07-11: qty 2

## 2016-07-11 MED ORDER — THROMBIN 5000 UNITS EX SOLR
CUTANEOUS | Status: AC
  Filled 2016-07-11: qty 5000

## 2016-07-11 MED ORDER — CEFAZOLIN SODIUM-DEXTROSE 2-4 GM/100ML-% IV SOLN
2.0000 g | Freq: Three times a day (TID) | INTRAVENOUS | Status: AC
  Administered 2016-07-11 – 2016-07-12 (×2): 2 g via INTRAVENOUS
  Filled 2016-07-11 (×3): qty 100

## 2016-07-11 MED ORDER — MIDAZOLAM HCL 2 MG/2ML IJ SOLN
INTRAMUSCULAR | Status: AC
  Filled 2016-07-11: qty 2

## 2016-07-11 MED ORDER — HYDROCODONE-ACETAMINOPHEN 5-325 MG PO TABS
1.0000 | ORAL_TABLET | ORAL | Status: DC | PRN
  Administered 2016-07-11 – 2016-07-12 (×4): 1 via ORAL
  Filled 2016-07-11 (×4): qty 1

## 2016-07-11 MED ORDER — HYDROMORPHONE HCL 1 MG/ML IJ SOLN
INTRAMUSCULAR | Status: AC
  Filled 2016-07-11: qty 0.5

## 2016-07-11 MED ORDER — BUPIVACAINE-EPINEPHRINE (PF) 0.5% -1:200000 IJ SOLN
INTRAMUSCULAR | Status: AC
  Filled 2016-07-11: qty 30

## 2016-07-11 MED ORDER — FUROSEMIDE 10 MG/ML IJ SOLN
INTRAMUSCULAR | Status: DC | PRN
  Administered 2016-07-11: 10 mg via INTRAMUSCULAR

## 2016-07-11 MED ORDER — PROPOFOL 10 MG/ML IV BOLUS
INTRAVENOUS | Status: DC | PRN
  Administered 2016-07-11 (×2): 100 mg via INTRAVENOUS

## 2016-07-11 MED ORDER — LIDOCAINE-EPINEPHRINE 2 %-1:100000 IJ SOLN
INTRAMUSCULAR | Status: DC | PRN
  Administered 2016-07-11: 3 mL

## 2016-07-11 MED ORDER — ONDANSETRON HCL 4 MG/2ML IJ SOLN: 4.0000 mg | INTRAMUSCULAR | Status: DC | PRN

## 2016-07-11 MED ORDER — BUPIVACAINE HCL (PF) 0.5 % IJ SOLN
INTRAMUSCULAR | Status: AC
  Filled 2016-07-11: qty 30

## 2016-07-11 MED ORDER — BACITRACIN ZINC 500 UNIT/GM EX OINT
TOPICAL_OINTMENT | CUTANEOUS | Status: DC | PRN
  Administered 2016-07-11: 1 via TOPICAL

## 2016-07-11 MED ORDER — PANTOPRAZOLE SODIUM 40 MG IV SOLR: 40.0000 mg | Freq: Every day | INTRAVENOUS | Status: DC

## 2016-07-11 MED ORDER — PROPOFOL 10 MG/ML IV BOLUS
INTRAVENOUS | Status: AC
  Filled 2016-07-11: qty 20

## 2016-07-11 MED ORDER — BUPIVACAINE-EPINEPHRINE (PF) 0.5% -1:200000 IJ SOLN
INTRAMUSCULAR | Status: DC | PRN
  Administered 2016-07-11: 3 mL

## 2016-07-11 MED ORDER — FLEET ENEMA 7-19 GM/118ML RE ENEM: 1.0000 | ENEMA | Freq: Once | RECTAL | Status: DC | PRN

## 2016-07-11 MED ORDER — BISACODYL 5 MG PO TBEC: 5.0000 mg | DELAYED_RELEASE_TABLET | Freq: Every day | ORAL | Status: DC | PRN

## 2016-07-11 MED ORDER — HYDRALAZINE HCL 20 MG/ML IJ SOLN
5.0000 mg | INTRAMUSCULAR | Status: DC | PRN
  Filled 2016-07-11: qty 0.5

## 2016-07-11 MED ORDER — LIDOCAINE HCL (CARDIAC) 20 MG/ML IV SOLN
INTRAVENOUS | Status: DC | PRN
  Administered 2016-07-11: 80 mg via INTRAVENOUS

## 2016-07-11 MED ORDER — SUGAMMADEX SODIUM 200 MG/2ML IV SOLN
INTRAVENOUS | Status: AC
  Filled 2016-07-11: qty 2

## 2016-07-11 MED ORDER — DOCUSATE SODIUM 100 MG PO CAPS
100.0000 mg | ORAL_CAPSULE | Freq: Two times a day (BID) | ORAL | Status: DC
  Administered 2016-07-11 – 2016-07-12 (×2): 100 mg via ORAL
  Filled 2016-07-11 (×2): qty 1

## 2016-07-11 MED ORDER — NALOXONE HCL 0.4 MG/ML IJ SOLN: 0.0800 mg | INTRAMUSCULAR | Status: DC | PRN

## 2016-07-11 MED ORDER — THROMBIN 20000 UNITS EX SOLR
CUTANEOUS | Status: DC | PRN
  Administered 2016-07-11: 20 mL via TOPICAL

## 2016-07-11 MED ORDER — PROMETHAZINE HCL 25 MG/ML IJ SOLN: 6.2500 mg | INTRAMUSCULAR | Status: DC | PRN

## 2016-07-11 MED ORDER — SODIUM CHLORIDE 0.9 % IV SOLN
INTRAVENOUS | Status: DC
  Administered 2016-07-11 (×3): via INTRAVENOUS

## 2016-07-11 MED ORDER — ONDANSETRON HCL 4 MG/2ML IJ SOLN
INTRAMUSCULAR | Status: AC
  Filled 2016-07-11: qty 2

## 2016-07-11 MED ORDER — MIDAZOLAM HCL 5 MG/5ML IJ SOLN
INTRAMUSCULAR | Status: DC | PRN
  Administered 2016-07-11: 2 mg via INTRAVENOUS

## 2016-07-11 MED ORDER — THROMBIN 20000 UNITS EX SOLR
CUTANEOUS | Status: AC
  Filled 2016-07-11: qty 20000

## 2016-07-11 MED ORDER — PROMETHAZINE HCL 25 MG PO TABS: 12.5000 mg | ORAL_TABLET | ORAL | Status: DC | PRN

## 2016-07-11 MED ORDER — HYDRALAZINE HCL 20 MG/ML IJ SOLN
INTRAMUSCULAR | Status: AC
  Administered 2016-07-11: 5 mg
  Filled 2016-07-11: qty 1

## 2016-07-11 MED ORDER — MICROFIBRILLAR COLL HEMOSTAT EX PADS
MEDICATED_PAD | CUTANEOUS | Status: DC | PRN
  Administered 2016-07-11: 1 via TOPICAL

## 2016-07-11 MED ORDER — 0.9 % SODIUM CHLORIDE (POUR BTL) OPTIME
TOPICAL | Status: DC | PRN
  Administered 2016-07-11 (×3): 1000 mL

## 2016-07-11 MED ORDER — SODIUM CHLORIDE 0.9 % IV SOLN
INTRAVENOUS | Status: DC
  Administered 2016-07-11 – 2016-07-12 (×2): via INTRAVENOUS

## 2016-07-11 MED ORDER — THROMBIN 5000 UNITS EX SOLR
OROMUCOSAL | Status: DC | PRN
  Administered 2016-07-11: 5 mL via TOPICAL

## 2016-07-11 MED ORDER — ONDANSETRON HCL 4 MG PO TABS: 4.0000 mg | ORAL_TABLET | ORAL | Status: DC | PRN

## 2016-07-11 MED ORDER — ROCURONIUM BROMIDE 100 MG/10ML IV SOLN
INTRAVENOUS | Status: DC | PRN
  Administered 2016-07-11: 20 mg via INTRAVENOUS
  Administered 2016-07-11: 50 mg via INTRAVENOUS
  Administered 2016-07-11: 10 mg via INTRAVENOUS
  Administered 2016-07-11: 30 mg via INTRAVENOUS

## 2016-07-11 MED ORDER — MANNITOL 25 % IV SOLN
INTRAVENOUS | Status: DC | PRN
  Administered 2016-07-11: 50 g via INTRAVENOUS

## 2016-07-11 MED ORDER — FENTANYL CITRATE (PF) 250 MCG/5ML IJ SOLN
INTRAMUSCULAR | Status: AC
  Filled 2016-07-11: qty 5

## 2016-07-11 MED ORDER — HYDROMORPHONE HCL 1 MG/ML IJ SOLN
0.5000 mg | INTRAMUSCULAR | Status: DC | PRN
  Administered 2016-07-11 – 2016-07-12 (×10): 1 mg via INTRAVENOUS
  Filled 2016-07-11 (×9): qty 1

## 2016-07-11 MED ORDER — BACITRACIN 50000 UNITS IM SOLR
INTRAMUSCULAR | Status: DC | PRN
  Administered 2016-07-11: 500 mL

## 2016-07-11 MED ORDER — BACITRACIN ZINC 500 UNIT/GM EX OINT
TOPICAL_OINTMENT | CUTANEOUS | Status: AC
  Filled 2016-07-11: qty 28.35

## 2016-07-11 MED ORDER — ONDANSETRON HCL 4 MG/2ML IJ SOLN
INTRAMUSCULAR | Status: DC | PRN
  Administered 2016-07-11: 4 mg via INTRAVENOUS

## 2016-07-11 MED ORDER — LIDOCAINE 2% (20 MG/ML) 5 ML SYRINGE
INTRAMUSCULAR | Status: AC
  Filled 2016-07-11: qty 5

## 2016-07-11 MED ORDER — SODIUM CHLORIDE 0.9 % IV SOLN
INTRAVENOUS | Status: DC | PRN
  Administered 2016-07-11 (×2): via INTRAVENOUS

## 2016-07-11 MED ORDER — FUROSEMIDE 10 MG/ML IJ SOLN
10.0000 mg | Freq: Once | INTRAMUSCULAR | Status: DC
  Filled 2016-07-11: qty 4

## 2016-07-11 MED ORDER — SUGAMMADEX SODIUM 200 MG/2ML IV SOLN
INTRAVENOUS | Status: DC | PRN
  Administered 2016-07-11: 300 mg via INTRAVENOUS

## 2016-07-11 MED ORDER — LIDOCAINE-EPINEPHRINE 2 %-1:100000 IJ SOLN
INTRAMUSCULAR | Status: AC
  Filled 2016-07-11: qty 1

## 2016-07-11 MED ORDER — SENNA 8.6 MG PO TABS
1.0000 | ORAL_TABLET | Freq: Two times a day (BID) | ORAL | Status: DC
  Administered 2016-07-11 – 2016-07-12 (×2): 8.6 mg via ORAL
  Filled 2016-07-11 (×2): qty 1

## 2016-07-11 MED ORDER — HYDROMORPHONE HCL 1 MG/ML IJ SOLN
INTRAMUSCULAR | Status: AC
  Filled 2016-07-11: qty 1

## 2016-07-11 MED ORDER — PHENYLEPHRINE HCL 10 MG/ML IJ SOLN
INTRAMUSCULAR | Status: DC | PRN
  Administered 2016-07-11: 25 ug/min via INTRAVENOUS

## 2016-07-11 MED ORDER — FENTANYL CITRATE (PF) 100 MCG/2ML IJ SOLN
INTRAMUSCULAR | Status: DC | PRN
  Administered 2016-07-11 (×2): 50 ug via INTRAVENOUS
  Administered 2016-07-11: 100 ug via INTRAVENOUS
  Administered 2016-07-11: 50 ug via INTRAVENOUS

## 2016-07-11 SURGICAL SUPPLY — 85 items
BENZOIN TINCTURE PRP APPL 2/3 (GAUZE/BANDAGES/DRESSINGS) IMPLANT
BLADE CLIPPER SURG (BLADE) ×4 IMPLANT
BLADE ULTRA TIP 2M (BLADE) IMPLANT
BNDG GAUZE ELAST 4 BULKY (GAUZE/BANDAGES/DRESSINGS) IMPLANT
BUR ACORN 6.0 PRECISION (BURR) IMPLANT
BUR ACORN 6.0MM PRECISION (BURR)
BUR MATCHSTICK NEURO 3.0 LAGG (BURR) ×4 IMPLANT
BUR SPIRAL ROUTER 2.3 (BUR) IMPLANT
BUR SPIRAL ROUTER 2.3MM (BUR)
CANISTER SUCT 3000ML PPV (MISCELLANEOUS) ×8 IMPLANT
CARTRIDGE OIL MAESTRO DRILL (MISCELLANEOUS) ×4 IMPLANT
CATH ROBINSON RED A/P 14FR (CATHETERS) IMPLANT
CHLORAPREP W/TINT 26ML (MISCELLANEOUS) ×4 IMPLANT
CLIP TI MEDIUM 6 (CLIP) IMPLANT
DIFFUSER DRILL AIR PNEUMATIC (MISCELLANEOUS) ×8 IMPLANT
DRAPE MICROSCOPE LEICA (MISCELLANEOUS) ×4 IMPLANT
DRAPE NEUROLOGICAL W/INCISE (DRAPES) ×4 IMPLANT
DRAPE SHEET LG 3/4 BI-LAMINATE (DRAPES) ×8 IMPLANT
DRAPE SURG 17X23 STRL (DRAPES) IMPLANT
DRAPE WARM FLUID 44X44 (DRAPE) ×4 IMPLANT
DRSG MEPILEX BORDER 4X8 (GAUZE/BANDAGES/DRESSINGS) ×4 IMPLANT
ELECT REM PT RETURN 9FT ADLT (ELECTROSURGICAL) ×4
ELECTRODE REM PT RTRN 9FT ADLT (ELECTROSURGICAL) ×2 IMPLANT
EVACUATOR 1/8 PVC DRAIN (DRAIN) IMPLANT
EVACUATOR SILICONE 100CC (DRAIN) IMPLANT
GAUZE SPONGE 4X4 12PLY STRL (GAUZE/BANDAGES/DRESSINGS) IMPLANT
GAUZE SPONGE 4X4 16PLY XRAY LF (GAUZE/BANDAGES/DRESSINGS) IMPLANT
GLOVE BIO SURGEON STRL SZ7 (GLOVE) IMPLANT
GLOVE BIOGEL PI IND STRL 7.0 (GLOVE) ×2 IMPLANT
GLOVE BIOGEL PI IND STRL 7.5 (GLOVE) ×6 IMPLANT
GLOVE BIOGEL PI INDICATOR 7.0 (GLOVE) ×2
GLOVE BIOGEL PI INDICATOR 7.5 (GLOVE) ×6
GLOVE EXAM NITRILE LRG STRL (GLOVE) ×4 IMPLANT
GLOVE EXAM NITRILE XL STR (GLOVE) IMPLANT
GLOVE EXAM NITRILE XS STR PU (GLOVE) IMPLANT
GLOVE SS BIOGEL STRL SZ 7.5 (GLOVE) IMPLANT
GLOVE SUPERSENSE BIOGEL SZ 7.5 (GLOVE)
GLOVE SURG SS PI 7.0 STRL IVOR (GLOVE) ×8 IMPLANT
GLOVE SURG SS PI 7.5 STRL IVOR (GLOVE) ×12 IMPLANT
GLOVE SURG SS PI 8.0 STRL IVOR (GLOVE) ×4 IMPLANT
GOWN STRL REUS W/ TWL LRG LVL3 (GOWN DISPOSABLE) ×2 IMPLANT
GOWN STRL REUS W/ TWL XL LVL3 (GOWN DISPOSABLE) ×6 IMPLANT
GOWN STRL REUS W/TWL 2XL LVL3 (GOWN DISPOSABLE) IMPLANT
GOWN STRL REUS W/TWL LRG LVL3 (GOWN DISPOSABLE) ×2
GOWN STRL REUS W/TWL XL LVL3 (GOWN DISPOSABLE) ×6
HEMOSTAT POWDER KIT SURGIFOAM (HEMOSTASIS) ×4 IMPLANT
HEMOSTAT SURGICEL 2X14 (HEMOSTASIS) ×4 IMPLANT
KIT BASIN OR (CUSTOM PROCEDURE TRAY) ×4 IMPLANT
KIT ROOM TURNOVER OR (KITS) ×4 IMPLANT
MARKER SPHERE PSV REFLC 13MM (MARKER) ×8 IMPLANT
NEEDLE HYPO 21X1.5 SAFETY (NEEDLE) ×8 IMPLANT
NEEDLE HYPO 25X1 1.5 SAFETY (NEEDLE) IMPLANT
NS IRRIG 1000ML POUR BTL (IV SOLUTION) ×12 IMPLANT
OIL CARTRIDGE MAESTRO DRILL (MISCELLANEOUS) ×8
PACK CRANIOTOMY (CUSTOM PROCEDURE TRAY) ×4 IMPLANT
PATTIES SURGICAL .5 X.5 (GAUZE/BANDAGES/DRESSINGS) IMPLANT
PATTIES SURGICAL .5 X3 (DISPOSABLE) IMPLANT
PATTIES SURGICAL 1X1 (DISPOSABLE) IMPLANT
PIN MAYFIELD SKULL DISP (PIN) ×4 IMPLANT
PLATE 1.5  2HOLE LNG NEURO (Plate) ×6 IMPLANT
PLATE 1.5 2HOLE LNG NEURO (Plate) ×6 IMPLANT
RUBBERBAND STERILE (MISCELLANEOUS) ×8 IMPLANT
SCREW SELF DRILL HT 1.5/4MM (Screw) ×24 IMPLANT
SPONGE LAP 18X18 X RAY DECT (DISPOSABLE) ×4 IMPLANT
SPONGE NEURO XRAY DETECT 1X3 (DISPOSABLE) IMPLANT
SPONGE SURGIFOAM ABS GEL 100 (HEMOSTASIS) ×4 IMPLANT
SPONGE SURGIFOAM ABS GEL 100C (HEMOSTASIS) IMPLANT
STAPLER VISISTAT 35W (STAPLE) ×4 IMPLANT
STOCKINETTE 6  STRL (DRAPES)
STOCKINETTE 6 STRL (DRAPES) IMPLANT
SUT ETHILON 3 0 FSL (SUTURE) IMPLANT
SUT ETHILON 3 0 PS 1 (SUTURE) IMPLANT
SUT NURALON 4 0 TR CR/8 (SUTURE) ×8 IMPLANT
SUT VIC AB 0 CT1 18XCR BRD8 (SUTURE) IMPLANT
SUT VIC AB 0 CT1 8-18 (SUTURE)
SUT VIC AB 2-0 CT1 18 (SUTURE) ×8 IMPLANT
SUT VICRYL RAPIDE 3/0 (SUTURE) ×4 IMPLANT
SYR 30ML LL (SYRINGE) ×12 IMPLANT
TOWEL GREEN STERILE (TOWEL DISPOSABLE) ×4 IMPLANT
TOWEL GREEN STERILE FF (TOWEL DISPOSABLE) ×3 IMPLANT
TRAY FOLEY W/METER SILVER 16FR (SET/KITS/TRAYS/PACK) ×4 IMPLANT
TUBE CONNECTING 12'X1/4 (SUCTIONS)
TUBE CONNECTING 12X1/4 (SUCTIONS) IMPLANT
UNDERPAD 30X30 (UNDERPADS AND DIAPERS) IMPLANT
WATER STERILE IRR 1000ML POUR (IV SOLUTION) ×4 IMPLANT

## 2016-07-11 NOTE — Anesthesia Preprocedure Evaluation (Addendum)
Anesthesia Evaluation  Patient identified by MRN, date of birth, ID band Patient awake    Reviewed: Allergy & Precautions, NPO status , Patient's Chart, lab work & pertinent test results  Airway Mallampati: I       Dental no notable dental hx.    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   Pulmonary exam normal breath sounds clear to auscultation       Cardiovascular negative cardio ROS Normal cardiovascular exam Rhythm:Regular Rate:Normal     Neuro/Psych Seizures -,  negative psych ROS   GI/Hepatic negative GI ROS, Neg liver ROS, (+)     substance abuse  alcohol use,   Endo/Other  negative endocrine ROS  Renal/GU negative Renal ROS  negative genitourinary   Musculoskeletal negative musculoskeletal ROS (+)   Abdominal (+) + scaphoid   Peds  Hematology  (+) Blood dyscrasia, anemia ,   Anesthesia Other Findings   Reproductive/Obstetrics                            Anesthesia Physical  Anesthesia Plan  ASA: III  Anesthesia Plan: General   Post-op Pain Management:    Induction: Intravenous  Airway Management Planned: Oral ETT  Additional Equipment:   Intra-op Plan:   Post-operative Plan: Extubation in OR  Informed Consent: I have reviewed the patients History and Physical, chart, labs and discussed the procedure including the risks, benefits and alternatives for the proposed anesthesia with the patient or authorized representative who has indicated his/her understanding and acceptance.   Dental advisory given  Plan Discussed with: CRNA and Surgeon  Anesthesia Plan Comments:         Anesthesia Quick Evaluation

## 2016-07-11 NOTE — Progress Notes (Signed)
Report called to short stay.  Pt  Going down for procedure via bed.

## 2016-07-11 NOTE — Progress Notes (Signed)
Pt belongings gathered and placed at the nurses desk in tele room.

## 2016-07-11 NOTE — Brief Op Note (Signed)
07/04/2016 - 07/11/2016  1:13 PM  PATIENT:  Eric Mason  46 y.o. male  PRE-OPERATIVE DIAGNOSIS:  Brain tumor  POST-OPERATIVE DIAGNOSIS:  Brain Tumor  PROCEDURE:  Procedure(s) with comments: Right Temporal craniotomy with brainlab (N/A) - Right Temporal  APPLICATION OF CRANIAL NAVIGATION (N/A)  SURGEON:  Surgeon(s) and Role:    * Kevan Ny Varnika Butz, MD - Primary  PHYSICIAN ASSISTANT:   ASSISTANTS: Kary Kos, MD  ANESTHESIA:   general  EBL:  Total I/O In: 2000 [I.V.:2000] Out: 800 [Urine:700; Blood:100]  BLOOD ADMINISTERED:none  DRAINS: none   LOCAL MEDICATIONS USED:  MARCAINE    and LIDOCAINE   SPECIMEN:  Excision  DISPOSITION OF SPECIMEN:  PATHOLOGY  COUNTS:  YES  TOURNIQUET:  * No tourniquets in log *  DICTATION: .Dragon Dictation  PLAN OF CARE: Admit to inpatient   PATIENT DISPOSITION:  ICU - extubated and stable.   Delay start of Pharmacological VTE agent (>24hrs) due to surgical blood loss or risk of bleeding: yes

## 2016-07-11 NOTE — Transfer of Care (Signed)
Immediate Anesthesia Transfer of Care Note  Patient: Elai Vanwyk  Procedure(s) Performed: Procedure(s) with comments: Right Temporal craniotomy with brainlab (N/A) - Right Temporal  APPLICATION OF CRANIAL NAVIGATION (N/A)  Patient Location: PACU  Anesthesia Type:General  Level of Consciousness: awake, alert  and oriented  Airway & Oxygen Therapy: Patient Spontanous Breathing and Patient connected to nasal cannula oxygen  Post-op Assessment: Report given to RN, Post -op Vital signs reviewed and stable, Patient moving all extremities X 4 and Patient able to stick tongue midline  Post vital signs: Reviewed and stable  Last Vitals:  Vitals:   07/10/16 2124 07/11/16 0446  BP: (!) 146/93 (!) 160/109  Pulse: 69 85  Resp: 18 19  Temp: 37.1 C 37.2 C    Last Pain:  Vitals:   07/11/16 0446  TempSrc: Oral  PainSc:       Patients Stated Pain Goal: 0 (89/38/10 1751)  Complications: No apparent anesthesia complications

## 2016-07-11 NOTE — Anesthesia Procedure Notes (Signed)
Procedure Name: Intubation Date/Time: 07/11/2016 11:04 AM Performed by: Neldon Newport Pre-anesthesia Checklist: Timeout performed, Patient being monitored, Suction available, Emergency Drugs available and Patient identified Patient Re-evaluated:Patient Re-evaluated prior to inductionOxygen Delivery Method: Circle system utilized Preoxygenation: Pre-oxygenation with 100% oxygen Intubation Type: IV induction Ventilation: Mask ventilation without difficulty Laryngoscope Size: Mac and 3 Grade View: Grade I Tube type: Oral Tube size: 7.5 mm Number of attempts: 1 Placement Confirmation: breath sounds checked- equal and bilateral,  positive ETCO2 and ETT inserted through vocal cords under direct vision Secured at: 23 cm Tube secured with: Tape Dental Injury: Teeth and Oropharynx as per pre-operative assessment

## 2016-07-11 NOTE — Progress Notes (Signed)
   Subjective: Eric Mason was seen and evaluated today at bedside prior to being taken down for craniotomy. Still having odynophagia and in pain. Did not sleep well overnight. Anxious about todays procedure.   Objective:  Vital signs in last 24 hours: Vitals:   07/10/16 1814 07/10/16 2035 07/10/16 2124 07/11/16 0446  BP: 122/67  (!) 146/93 (!) 160/109  Pulse: 75  69 85  Resp: '16  18 19  '$ Temp: 98.4 F (36.9 C)  98.7 F (37.1 C) 98.9 F (37.2 C)  TempSrc: Oral  Oral Oral  SpO2: 98%  100% 100%  Weight:  109 lb 9.1 oz (49.7 kg)    Height:       Physical Exam  Constitutional: No distress.  Thin african [place] male. Appears fatigued  HENT:  Head: Normocephalic and atraumatic.  Cardiovascular: Normal rate, regular rhythm and normal heart sounds.  Exam reveals no gallop and no friction rub.   No murmur heard. Pulmonary/Chest: Effort normal and breath sounds normal. No respiratory distress. He has no wheezes. He has no rales.  Abdominal: Soft. He exhibits no distension.  Musculoskeletal: He exhibits no edema.  Neurological: He is alert. He exhibits normal muscle tone.  Skin: Skin is warm and dry. He is not diaphoretic.   Assessment/Plan:  Principal Problem:   Odynophagia Active Problems:   Seizure (HCC)   Intractable nausea and vomiting   Dehydration   Acute esophagitis  Esophagitis/Odynophagia: Still with odynophagia and chest pain. Biopsies pending from EGD which demonstrated esophagitis throughout with superficial ulcerations.  -Diet per neurosurg postop -BID Protonix -Fluconazole -Viscous lidocaine and GI Cocktail PRN -Continue to appreciate GI recs -Did not require morphine overnight  Hyponatremia: ?Low-solute intake vs SIADH in this patient with known mass. Urine osm 812 + Serum osm 278, this is in line with SIADH. Wonder if patient could also be volume depleted in the setting of decreased PO intake as well? -Fluid restrict for suspected SIADH  Solitary Brain  mass, Right posterior temporal lobe: Craniotomy with resection today.   mass on brain MRI. On Keppra and dexamethasone. -Craniotomy, post-op recs per neurosurg -Dexamethasone 4 mg IV BID -Keppra 500 mg po BID  Hypertension Will need outpatient follow up for blood pressure management. - hydral '5mg'$  prn for >180/90   Dispo: Anticipated discharge pending adequate PO intake and post-craniotomy recs.    Verita Kuroda, DO 07/11/2016, 10:06 AM Pager: [phone]

## 2016-07-11 NOTE — Progress Notes (Signed)
No acute events Awake, alert, and oriented Moving all extremities well All questions answered Ready for OR- right temporooccipital craniotomy for tumor resection

## 2016-07-11 NOTE — Progress Notes (Signed)
Esophageal pathology: - DENSELY INFLAMED AND ULCERATED SQUAMOUS-LINED MUCOSA. - THERE IS NO EVIDENCE OF MICROORGANISMS, DYSPLASIA, OR MALIGNANCY.  Not clear what is source of his esophagitis and odynophagia.   Day 7 Diflucan and day 8 Protonix. In surgery today for resection of temporal lobe mass, did not meet with pt.   Azucena Freed PA-C

## 2016-07-11 NOTE — Anesthesia Postprocedure Evaluation (Addendum)
Anesthesia Post Note  Patient: Eric Mason  Procedure(s) Performed: Procedure(s) (LRB): Right Temporal craniotomy with brainlab (N/A) APPLICATION OF CRANIAL NAVIGATION (N/A)  Patient location during evaluation: PACU Anesthesia Type: General Level of consciousness: awake, sedated and lethargic Pain management: pain level controlled Vital Signs Assessment: post-procedure vital signs reviewed and stable Respiratory status: spontaneous breathing, nonlabored ventilation, respiratory function stable and patient connected to nasal cannula oxygen Cardiovascular status: blood pressure returned to baseline and stable Postop Assessment: no signs of nausea or vomiting Anesthetic complications: no       Last Vitals:  Vitals:   07/11/16 1425 07/11/16 1445  BP:  (!) 137/108  Pulse:    Resp:    Temp: 36.4 C 36.9 C    Last Pain:  Vitals:   07/11/16 1445  TempSrc: Oral  PainSc:                  Makenah Karas,JAMES TERRILL

## 2016-07-12 ENCOUNTER — Inpatient Hospital Stay (HOSPITAL_COMMUNITY): Payer: Medicaid Other

## 2016-07-12 ENCOUNTER — Encounter (HOSPITAL_COMMUNITY): Payer: Self-pay | Admitting: Neurological Surgery

## 2016-07-12 DIAGNOSIS — Z9889 Other specified postprocedural states: Secondary | ICD-10-CM

## 2016-07-12 DIAGNOSIS — G40909 Epilepsy, unspecified, not intractable, without status epilepticus: Secondary | ICD-10-CM

## 2016-07-12 DIAGNOSIS — E860 Dehydration: Secondary | ICD-10-CM

## 2016-07-12 LAB — CBC
HCT: 33.8 % — ABNORMAL LOW (ref 39.0–52.0)
Hemoglobin: 11.8 g/dL — ABNORMAL LOW (ref 13.0–17.0)
MCH: 30.1 pg (ref 26.0–34.0)
MCHC: 34.9 g/dL (ref 30.0–36.0)
MCV: 86.2 fL (ref 78.0–100.0)
PLATELETS: 166 10*3/uL (ref 150–400)
RBC: 3.92 MIL/uL — ABNORMAL LOW (ref 4.22–5.81)
RDW: 13 % (ref 11.5–15.5)
WBC: 8.2 10*3/uL (ref 4.0–10.5)

## 2016-07-12 LAB — BASIC METABOLIC PANEL
Anion gap: 7 (ref 5–15)
BUN: 12 mg/dL (ref 6–20)
CHLORIDE: 99 mmol/L — AB (ref 101–111)
CO2: 22 mmol/L (ref 22–32)
CREATININE: 0.54 mg/dL — AB (ref 0.61–1.24)
Calcium: 7.9 mg/dL — ABNORMAL LOW (ref 8.9–10.3)
GFR calc Af Amer: >60 mL/min (ref >60)
GLUCOSE: 110 mg/dL — AB (ref 65–99)
Potassium: 3.6 mmol/L (ref 3.5–5.1)
SODIUM: 128 mmol/L — AB (ref 135–145)

## 2016-07-12 MED ORDER — OXYCODONE-ACETAMINOPHEN 5-325 MG PO TABS: 1.0000 | ORAL_TABLET | ORAL | 0 refills | Status: AC | PRN

## 2016-07-12 MED ORDER — DEXAMETHASONE 4 MG PO TABS: 4.0000 mg | ORAL_TABLET | Freq: Two times a day (BID) | ORAL | 0 refills | Status: DC

## 2016-07-12 MED ORDER — SUCRALFATE 1 GM/10ML PO SUSP: 1.0000 g | Freq: Three times a day (TID) | ORAL | 0 refills | Status: DC

## 2016-07-12 MED ORDER — GADOBENATE DIMEGLUMINE 529 MG/ML IV SOLN
10.0000 mL | Freq: Once | INTRAVENOUS | Status: AC | PRN
  Administered 2016-07-12: 10 mL via INTRAVENOUS

## 2016-07-12 MED ORDER — SUCRALFATE 1 GM/10ML PO SUSP
1.0000 g | Freq: Three times a day (TID) | ORAL | Status: DC
  Administered 2016-07-12 (×2): 1 g via ORAL
  Filled 2016-07-12 (×2): qty 10

## 2016-07-12 MED ORDER — PANTOPRAZOLE SODIUM 40 MG PO TBEC: 40.0000 mg | DELAYED_RELEASE_TABLET | Freq: Two times a day (BID) | ORAL | 0 refills | Status: DC

## 2016-07-12 MED ORDER — OXYCODONE-ACETAMINOPHEN 5-325 MG PO TABS
2.0000 | ORAL_TABLET | ORAL | Status: DC | PRN
  Administered 2016-07-12: 2 via ORAL
  Filled 2016-07-12: qty 2

## 2016-07-12 NOTE — Progress Notes (Signed)
   Subjective: Eric Mason was seen and evaluated today at bedside. POD#1 cranitomy with tumor resection. Still having odynophagia and pain however improved. Able to tolerate some PO. Noticed improvement with carafate.   Objective:  Vital signs in last 24 hours: Vitals:   07/12/16 0600 07/12/16 0630 07/12/16 0700 07/12/16 0729  BP: 105/85 112/70 103/67   Pulse: 69 (!) 53 (!) 54   Resp: '13 12 11   '$ Temp:    98.3 F (36.8 C)  TempSrc:    Oral  SpO2: 100% 99% 100%   Weight:      Height:       Physical Exam  Constitutional: He is oriented to person, place, and time and well-developed, well-nourished, and in no distress. No distress.  Thin african [place] male. Appears fatigued  HENT:  Large clean dry bandage left lateral scalp.  Cardiovascular: Normal rate, regular rhythm and normal heart sounds.  Exam reveals no gallop and no friction rub.   No murmur heard. Pulmonary/Chest: Effort normal and breath sounds normal. No respiratory distress. He has no wheezes. He has no rales.  Abdominal: Soft. Bowel sounds are normal. He exhibits no distension.  Musculoskeletal: He exhibits no edema.  Neurological: He is alert and oriented to person, place, and time. He exhibits normal muscle tone.  Skin: Skin is warm and dry. He is not diaphoretic.  Psychiatric: Mood and affect normal.   Assessment/Plan:  Principal Problem:   Odynophagia Active Problems:   Seizure (HCC)   Intractable nausea and vomiting   Dehydration   Acute esophagitis  Esophagitis/Odynophagia: Still with odynophagia and chest pain however improved. Noticed much improvement with carafate this morning. Biopsies inconclusive. Showed densely inflamed and ulcerated squamous mucosa without evidence of microorganisims, dysplasia or malignancy. Viral staining negative.  -GI have signed off.  -BID Protonix -D/c fluconazole as no evidence for yeast on EGD biopsy -Carafate  Hyponatremia: Na stable. 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  -Fluid  restriction to 2L, careful with over restriction.   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  Pain controlled. On Keppra and dexamethasone. -Craniotomy, post-op recs per neurosurg -Dexamethasone 4 mg IV BID, Keppra 500 mg po BID -Transfer to floor  Hypertension Will need outpatient follow up for blood pressure management. - hydral '5mg'$  prn for >180/90   Dispo: Anticipated discharge pending adequate PO intake and neurosurg OK.   Kyle Stansell, DO 07/12/2016, 7:38 AM Pager: [phone]

## 2016-07-12 NOTE — Progress Notes (Signed)
Pt seen and examined.  Complains of pain.  EXAM: Temp:  [97.5 F (36.4 C)-98.7 F (37.1 C)] 98.3 F (36.8 C) (05/03 0729) Pulse Rate:  [42-87] 66 (05/03 0800) Resp:  [8-18] 18 (05/03 0800) BP: (103-171)/(56-117) 123/84 (05/03 0800) SpO2:  [97 %-100 %] 100 % (05/03 0800) Arterial Line BP: (106-181)/(51-108) 124/66 (05/03 0700) Weight:  [50.7 kg (111 lb 12.4 oz)] 50.7 kg (111 lb 12.4 oz) (05/02 1445) Intake/Output      05/02 0701 - 05/03 0700 05/03 0701 - 05/04 0700   P.O. 720    I.V. (mL/kg) 3915.3 (77.2) 100 (2)   IV Piggyback 200    Total Intake(mL/kg) 4835.3 (95.4) 100 (2)   Urine (mL/kg/hr) 3350 (2.8)    Stool     Blood 100 (0.1)    Total Output 3450     Net +1385.3 +100         Awake and alert Follows commands throughout Full strength Bandage c/d/i  Stable TTF No active neurosurgical issues

## 2016-07-12 NOTE — Progress Notes (Signed)
Transitions of Care Pharmacy Note  Plan:  Educated on new PPI and sucralfate.  Educated on dexamethasone and instructed patient to try and take the steroid with food.  Educated on how to take Percocet.  --------------------------------------------- Eric Mason is an 46 y.o. male who presents with a chief complaint of nausea, vomiting and odynophagia. In anticipation of discharge, pharmacy has reviewed this patient's prior to admission medication history, as well as current inpatient medications listed per the [facility].  Current medication indications, dosing, frequency, and notable side effects reviewed with patient. patient verbalized understanding of current inpatient medication regimen and is aware that the After Visit Summary when presented, will represent the most accurate medication list at discharge.   Eric Mason expressed concerns regarding receiving a hard copy prescription for his pain medication. I explained that this would likely be included with his discharge paperwork.    Assessment: Understanding of regimen: good Understanding of indications: good Potential of compliance: good Barriers to Obtaining Medications: No  Patient instructed to contact inpatient pharmacy team with further questions or concerns if needed.    Time spent preparing for discharge counseling: 10 minutes  Time spent counseling patient: 10 minutes    Thank you for allowing pharmacy to be a part of this patient's care.  Ihor Austin, PharmD PGY1 Pharmacy Resident Pager: [phone]

## 2016-07-12 NOTE — Discharge Summary (Signed)
Name: Rollin Kotowski MRN: [id number] DOB: 1971-03-06 46 y.o. PCP: Lorella Nimrod, MD  Date of Admission: 07/04/2016  1:02 PM Date of Discharge: 07/12/2016 Attending Physician: Axel Filler, MD  Discharge Diagnosis: 1. Odynophagia with esophagitis 2. Brain Mass, s/p resection  Principal Problem:   Odynophagia Active Problems:   Seizure (HCC)   Intractable nausea and vomiting   Dehydration   Acute esophagitis   Discharge Medications: Allergies as of 07/12/2016      Reactions   Latex    UNSPECIFIED REACTION       Medication List    TAKE these medications   fentaNYL 12 MCG/HR Commonly known as:  DURAGESIC - dosed mcg/hr Place 2 patches (25 mcg total) onto the skin every 3 (three) days.   folic acid 1 MG tablet Commonly known as:  FOLVITE Take 1 tablet (1 mg total) by mouth daily.   ibuprofen 200 MG tablet Commonly known as:  ADVIL,MOTRIN Take 400 mg by mouth every 6 (six) hours as needed for mild pain.   levETIRAcetam 500 MG tablet Commonly known as:  KEPPRA Take 1 tablet (500 mg total) by mouth 2 (two) times daily.   multivitamin with minerals Tabs tablet Take 1 tablet by mouth daily.   nicotine 21 mg/24hr patch Commonly known as:  NICODERM CQ - dosed in mg/24 hours Place 1 patch (21 mg total) onto the skin daily.   ondansetron 8 MG tablet Commonly known as:  ZOFRAN Take 1 tablet (8 mg total) by mouth every 8 (eight) hours as needed for nausea or vomiting.   Oxycodone HCl 10 MG Tabs Take 1 tablet (10 mg total) by mouth 3 (three) times daily as needed.   oxyCODONE-acetaminophen 5-325 MG tablet Commonly known as:  PERCOCET/ROXICET Take 1-2 tablets by mouth every 4 (four) hours as needed for severe pain.   ranitidine 75 MG tablet Commonly known as:  ZANTAC 75 Take 1 tablet (75 mg total) by mouth 2 (two) times daily.   thiamine 100 MG tablet Take 1 tablet (100 mg total) by mouth daily.      Disposition and follow-up:   Mr.Stokes  Stay was discharged from [facility] in stable condition.  At the hospital follow up visit please address:  1.  Esophagitis without clear etiology: Compliance with BID PPI and carafate. Ensure symptoms continue to improve.  Brain mass: Will need continued follow-up with oncology. BIOPSY PENDING.  2.  Labs / imaging needed at time of follow-up: None  3.  Pending labs/ test needing follow-up: BRAIN BIOPSY  Follow-up Appointments:   Hospital Course by problem list: Principal Problem:   Odynophagia Active Problems:   Seizure ([place])   Intractable nausea and vomiting   Dehydration   Acute esophagitis   1. Odynophagia secondary to Acute Esophagitis Admitted 07/04/16 with 3 day history of nausea, vomiting and difficulty swallowing after feeling like there was something stuck in his chest. He had been on dexamethasone for his brain mass prior to presentation. He was treated empirically with BID PPI and fluconazole for candidal esophagitis in setting of immunosuppression. GI performed EGD with mod-severe diffuse esophagitis without active bleeding. Biopsies taken which resulted with non-specific inflammation, and no organisms were appreciated. Viral stain negative. Fluconazole was subsequently discontinued after 8 days of therapy.  He was able to tolerate PO intake prior to discharge. He found much relief with PO Carafate.   2. Brain mass with Resection, History of Seizures Admitted recently and discovered to have brain mass at  which time he was started on Keppra and dexamethasone. Tumor resected by neurosurgery during admission 5/2 without apparent complication. He recovered well in the peri-operative period. Biopsies pending.  Discharge Vitals:   BP 129/81   Pulse (!) 36   Temp 98.3 F (36.8 C) (Oral)   Resp 17   Ht '5\' 11"'$  (1.803 m)   Wt 111 lb 12.4 oz (50.7 kg)   SpO2 98%   BMI 15.59 kg/m   Pertinent Labs, Studies, and Procedures:  Craniotomy with resection of brain  mass 07/11/16 EGD: Diffuse esophagitis throughout entire esophagus. No active bleeding. Esophageal biopsy: Inflammation of squamous lining. No microorganisms appreciated, specifically no bacteria, fungus nor virus appreciated.  CT abdomen/pelvis: Circumferential wall thickening about distal esophagus. Mildly enlarged prostate. 6m stable left lower lobe pulmonary nodule.   Discharge Instructions: Continue twice daily PPI and carafate before meals for inflammation of esophagus. Would continue PPI while on steroids. Will need to follow-up with neurosurgery, oncology as well as his primary care physician after discharge.   Signed:Einar Gip DO 07/12/2016, 11:21 AM   Pager: [phone]

## 2016-07-12 NOTE — Progress Notes (Signed)
Attempted several times this am to see pt. Pt still at MRI. Will attempt back as schedule allows. Jinger Neighbors, [place] [phone]

## 2016-07-12 NOTE — Progress Notes (Signed)
Attempted to call report.  "bed not approved yet.  Call back in 15 min"

## 2016-07-12 NOTE — Progress Notes (Signed)
Report given to [facility] RN at this time.  Pt has no s/s of any acute distress.  Recently treated for pain.

## 2016-07-12 NOTE — Care Management Note (Signed)
Case Management Note  Patient Details  Name: Eric Mason MRN: [id number] Date of Birth: 16-Dec-1970  Subjective/Objective:                    Action/Plan: Pt discharging home with self care. Pt has been seen at [facility] in the past and is willing to continue with them for his Primary care. Pt's medications faxed to [facility] in [place]. Pt has no insurance. CM provided the patient a [place] letter to assist with the cost of his meds. Pt states he has transportation home.   Expected Discharge Date:  07/12/16               Expected Discharge Plan:  Home/Self Care  In-House Referral:     Discharge planning Services  CM Consult, Medication Assistance  Post Acute Care Choice:    Choice offered to:     DME Arranged:    DME Agency:     HH Arranged:    HH Agency:     Status of Service:  Completed, signed off  If discussed at [employer] of Stay Meetings, dates discussed:    Additional Comments:  Pollie Friar, RN 07/12/2016, 5:20 PM

## 2016-07-12 NOTE — Progress Notes (Signed)
PT Cancellation Note  Patient Details Name: Eric Mason MRN: [id number] DOB: June 16, 1970   Cancelled Treatment:    Reason Eval/Treat Not Completed: PT screened, no needs identified, will sign off.  Per RN, pt is not in his room because MD gave permission for him to walk around campus.  She reports he has no PT needs.  PT to sign off.  Thanks, -   Esly Selvage B. [place], [place], DPT [phone]   07/12/2016, 2:51 PM

## 2016-07-12 NOTE — Progress Notes (Signed)
Attempted to call report at this time 

## 2016-07-13 ENCOUNTER — Other Ambulatory Visit: Payer: Self-pay | Admitting: Pharmacist

## 2016-07-13 ENCOUNTER — Telehealth: Payer: Self-pay | Admitting: Pulmonary Disease

## 2016-07-13 ENCOUNTER — Other Ambulatory Visit: Payer: Self-pay | Admitting: Internal Medicine

## 2016-07-13 DIAGNOSIS — R112 Nausea with vomiting, unspecified: Secondary | ICD-10-CM

## 2016-07-13 DIAGNOSIS — C7931 Secondary malignant neoplasm of brain: Secondary | ICD-10-CM

## 2016-07-13 MED ORDER — SUCRALFATE 1 GM/10ML PO SUSP: 1.0000 g | Freq: Three times a day (TID) | ORAL | 0 refills | Status: DC

## 2016-07-13 MED ORDER — DEXAMETHASONE 4 MG PO TABS: 4.0000 mg | ORAL_TABLET | Freq: Two times a day (BID) | ORAL | 0 refills | Status: DC

## 2016-07-13 MED ORDER — PANTOPRAZOLE SODIUM 40 MG PO TBEC: 40.0000 mg | DELAYED_RELEASE_TABLET | Freq: Two times a day (BID) | ORAL | 0 refills | Status: DC

## 2016-07-13 MED FILL — CARAFATE 1 GM/10 ML SUSP: 1 | 11 days supply | Qty: 420 | Fill #0

## 2016-07-13 MED FILL — PANTOPRAZOLE SOD DR 40 MG T: 40 | 30 days supply | Qty: 60 | Fill #0

## 2016-07-13 MED FILL — DEXAMETHASONE 4 MG TABLET: 4 | 30 days supply | Qty: 60 | Fill #0

## 2016-07-13 NOTE — Telephone Encounter (Signed)
Sharyne Richters requesting a call back about patient's Medication.  Patient has previously called and is now @ the Pharmacy and patient has now missed 3 doses of his medication since his D/C.

## 2016-07-13 NOTE — Telephone Encounter (Signed)
Return call to Sharyne Richters - no answer; left message to give [place] a call back.

## 2016-07-13 NOTE — Telephone Encounter (Signed)
Received call from daughter, Eric Mason, regarding her father, Eric Mason this morning at 8:36AM. He was recently discharged and prescriptions were sent to to [facility]. They tried to use their Match letter and the pharmacy noted that the prescriber was not authorized to be covered under this letter. Will forward this to the discharging team.

## 2016-07-17 ENCOUNTER — Telehealth: Payer: Self-pay | Admitting: General Practice

## 2016-07-17 ENCOUNTER — Telehealth: Payer: Self-pay | Admitting: *Deleted

## 2016-07-17 NOTE — Telephone Encounter (Signed)
PT wife called, she is concern that her husband is over medicate with the pain medication, she would like to speak with a nurse to get some advice or info. Please follow up

## 2016-07-17 NOTE — Telephone Encounter (Signed)
Pt denies dizziness, headache, N/V,  blurred vision, or new gait concerns. Pt only c/o shakes due to pain in head. He states he has to take 3 Percocet at a time. He states this does not get rid of the pain, but he is able to tolerate it.  Pt advised to call Dr. Cyndy Freeze office per office visit note  06/25/16 to schedule appointment. Phone number givien to Dr. Cyndy Freeze office. Appointment scheduled with Dr. [name]: May 15,@ 2:30pm.

## 2016-07-18 ENCOUNTER — Telehealth: Payer: Self-pay | Admitting: *Deleted

## 2016-07-18 ENCOUNTER — Other Ambulatory Visit: Payer: Self-pay | Admitting: Radiation Oncology

## 2016-07-18 ENCOUNTER — Telehealth: Payer: Self-pay | Admitting: Radiation Oncology

## 2016-07-18 DIAGNOSIS — C7931 Secondary malignant neoplasm of brain: Secondary | ICD-10-CM

## 2016-07-18 MED ORDER — FENTANYL 12 MCG/HR TD PT72: 12.5000 ug | MEDICATED_PATCH | TRANSDERMAL | 0 refills | Status: DC

## 2016-07-18 MED ORDER — OXYCODONE HCL 10 MG PO TABS: 10.0000 mg | ORAL_TABLET | Freq: Three times a day (TID) | ORAL | 0 refills | Status: DC | PRN

## 2016-07-18 NOTE — Telephone Encounter (Signed)
Patient called requesting refill on Fentanyl patch written by Lowella Dandy on 06/25/16, he had brain surgery stated last week and Dr. Cyndy Freeze referred him back to  Dr. Ida Rogue office,  I will inbasket our Pa and see if she will refill this for you and will call you in the morning , Patient hasn't been seen in this office as yet 4:11 PM

## 2016-07-18 NOTE — Telephone Encounter (Signed)
I called the patient. The pharmacy misundersood my prescription for his fentanyl and he should be using this one patch per day. He will be due for follow up next week after he's had his staples out.

## 2016-07-19 MED FILL — fentaNYL 12 MCG/HR PT72: 12 | 30 days supply | Qty: 10 | Fill #0

## 2016-07-22 NOTE — Op Note (Signed)
07/04/2016 - 07/11/2016  10:07 AM  PATIENT:  Eric Mason  46 y.o. male  PRE-OPERATIVE DIAGNOSIS:  Right posterior temporal mass  POST-OPERATIVE DIAGNOSIS:  Same  PROCEDURE:  Craniotomy for resection of intraaxial mass, supratentorial, right; use of stereotactic navigation; microsurgical technique requiring use of operating microscope  SURGEON:  Aldean Ast, MD  ASSISTANTS: Kary Kos, MD  ANESTHESIA:   General  DRAINS: None   SPECIMEN:  Brain tumor  INDICATION FOR PROCEDURE: 46 year old male with a right posterior temporal mass discovered after he had a seizure.  Attempts were made to biopsy a small lung nodule but the result was non-diagnostic.  The recommendation was made to obtain tissue prior to radiation treatment.  Patient understood the risks, benefits, and alternatives and potential outcomes and wished to proceed.  PROCEDURE DETAILS: After smooth induction of general endotracheal anesthesia the patient's head was fixated to the operative table with Mayfield pins. The right temporoparietoocipital area was clipped of hair and wiped out with alcohol. The [place] system was registered and an incision was planned.  The area was prepped and draped in the usual sterile fashion. The planned incision was infiltrated with lidocaine and Marcaine with epinephrine. The skin was opened sharply. Self retaining retractors were placed. Brainlab stereotactic navigation was used to plan the craniotomy.  A craniotomy was fashioned by drilling a trough with the matchstick burr. It was then elevated with a periosteal elevator.  The dura was opened sharply. It was secured back with Nurolon sutures. The BrainLab stereotactic system was used to identify the point where the tumor came closest to the cortical surface. At this point a corticotomy was made and and I dissected to the surface of the tumor. I encountered a discrete dark grey mass.  This was removed en bloc with forceps and sent for  pathological analysis.    The microscope was draped and brought into the field.  I identified some areas of abnormality at the resection cavity which appeared consistent with tumor.  I used bipolar cautery and suction aspiration to remove these.  Hemostasis was obtained. I placed Surgicel squares in the depths of the resection cavity. I irrigated with bacitracin saline. The dura was closed with interrupted Nurolon sutures. The bone flap was secured to the skull with titanium fixation plates. The wound was then irrigated again with bacitracin saline. The galea was closed with 2-0 Vicryl sutures. The skin was closed with running vicryl suture.  Mayfield pins were removed, the wound was dressed, and the patient was allowed to waken from anesthesia.  PATIENT DISPOSITION:  ICU - extubated and stable.   Delay start of Pharmacological VTE agent (>24hrs) due to surgical blood loss or risk of bleeding:  yes

## 2016-07-24 ENCOUNTER — Ambulatory Visit: Payer: Medicaid Other | Attending: Internal Medicine | Admitting: Internal Medicine

## 2016-07-24 ENCOUNTER — Encounter: Payer: Self-pay | Admitting: Internal Medicine

## 2016-07-24 ENCOUNTER — Ambulatory Visit: Payer: Self-pay | Admitting: Internal Medicine

## 2016-07-24 VITALS — BP 133/82 | HR 93 | Temp 98.9°F | Resp 20 | Ht 70.0 in | Wt 135.6 lb

## 2016-07-24 DIAGNOSIS — D496 Neoplasm of unspecified behavior of brain: Secondary | ICD-10-CM | POA: Diagnosis not present

## 2016-07-24 DIAGNOSIS — F1410 Cocaine abuse, uncomplicated: Secondary | ICD-10-CM | POA: Insufficient documentation

## 2016-07-24 DIAGNOSIS — K209 Esophagitis, unspecified: Secondary | ICD-10-CM | POA: Diagnosis not present

## 2016-07-24 DIAGNOSIS — R918 Other nonspecific abnormal finding of lung field: Secondary | ICD-10-CM | POA: Insufficient documentation

## 2016-07-24 DIAGNOSIS — M79605 Pain in left leg: Secondary | ICD-10-CM | POA: Diagnosis not present

## 2016-07-24 DIAGNOSIS — B370 Candidal stomatitis: Secondary | ICD-10-CM | POA: Diagnosis not present

## 2016-07-24 DIAGNOSIS — F17200 Nicotine dependence, unspecified, uncomplicated: Secondary | ICD-10-CM | POA: Diagnosis not present

## 2016-07-24 DIAGNOSIS — Z09 Encounter for follow-up examination after completed treatment for conditions other than malignant neoplasm: Secondary | ICD-10-CM | POA: Diagnosis not present

## 2016-07-24 DIAGNOSIS — F1210 Cannabis abuse, uncomplicated: Secondary | ICD-10-CM | POA: Diagnosis not present

## 2016-07-24 DIAGNOSIS — F1021 Alcohol dependence, in remission: Secondary | ICD-10-CM | POA: Diagnosis not present

## 2016-07-24 DIAGNOSIS — Z808 Family history of malignant neoplasm of other organs or systems: Secondary | ICD-10-CM | POA: Diagnosis not present

## 2016-07-24 DIAGNOSIS — M79604 Pain in right leg: Secondary | ICD-10-CM | POA: Insufficient documentation

## 2016-07-24 DIAGNOSIS — R112 Nausea with vomiting, unspecified: Secondary | ICD-10-CM | POA: Insufficient documentation

## 2016-07-24 DIAGNOSIS — F1910 Other psychoactive substance abuse, uncomplicated: Secondary | ICD-10-CM

## 2016-07-24 MED ORDER — NYSTATIN 100000 UNIT/ML MT SUSP: 5.0000 mL | Freq: Four times a day (QID) | OROMUCOSAL | 0 refills | Status: DC

## 2016-07-24 MED ORDER — VITAMIN B-1 100 MG PO TABS: 100.0000 mg | ORAL_TABLET | Freq: Every day | ORAL | 0 refills | Status: DC

## 2016-07-24 MED ORDER — OXYCODONE HCL 10 MG PO TABS: 10.0000 mg | ORAL_TABLET | Freq: Two times a day (BID) | ORAL | 0 refills | Status: DC | PRN

## 2016-07-24 MED ORDER — FOLIC ACID 1 MG PO TABS: 1.0000 mg | ORAL_TABLET | Freq: Every day | ORAL | 1 refills | Status: AC

## 2016-07-24 MED ORDER — SUCRALFATE 1 GM/10ML PO SUSP: 1.0000 g | Freq: Three times a day (TID) | ORAL | 3 refills | Status: DC

## 2016-07-24 MED ORDER — PANTOPRAZOLE SODIUM 40 MG PO TBEC: 40.0000 mg | DELAYED_RELEASE_TABLET | Freq: Two times a day (BID) | ORAL | 2 refills | Status: DC

## 2016-07-24 MED FILL — NYSTATIN 100,000 UNITS/ML S: 100000 | 3 days supply | Qty: 60 | Fill #0

## 2016-07-24 MED FILL — FOLIC ACID 1 MG TABLET: 1 | 30 days supply | Qty: 30 | Fill #0

## 2016-07-24 MED FILL — CARAFATE 1 GM/10 ML SUSP: 1 | 30 days supply | Qty: 420 | Fill #0

## 2016-07-24 NOTE — Progress Notes (Signed)
Patient ID: Eric Mason, male    DOB: 17-Dec-1970  MRN: [id number]  CC: No chief complaint on file.   Subjective: Eric Mason is a 46 y.o. male who presents for hosp f/u. Wife, Eric Mason, is with him.  His concerns today include:  This a 46 year old African-American male with history of tobacco dependence, and polysubstance abuse(ETOH, marijuana, cocaine)  who was hospitalized at [facility] April 7-13th with new onset seizures. He was found to have a right temporal lobe mass and pulmonary nodules. Seen by neurology and started on Keppra and Decadron. Had bronchoscopy with negative BAL. PET scan was recommended.  Return to the hospital April 25- May 3 with odynophagia. Found to have esophagitis. Fungal cultures were negative. HIV test negative. Biopsy done during EGD revealed nonspecific inflammation. Symptoms improved with Carafate and PPI. Brain tumor was resected. Pathology revealed poorly differentiated neoplasm. Has appt with Dr. Cyndy Freeze on 08/02/2016.  Was told to make appt with oncologist after he sees neurosurgeon.  On fentanyl patch.  He presents today to established care and for med refills. Ran out of Carafate yesterday.  Some feeling of food being stuck in chest with burning since being out.    HA:  Worse at nights and in the mornings. No N/V. HA last about 1-2 hrs. Fentanyl helps but break through pain in mornings and nights since dose decreased to 12.5 mcg.  Request RF on Oxycodone to use PRN New symptom: Waking up with pain in legs from knees down + swelling in ankles x 1 wk.  Little tingling in feet.  -skin on palm of hands peeling  Admits to hx of polysubstance abuse.  States he has been clean of ETOH, cocaine since hosp with sz. Denies ETOH craving. Smoked weed once or twice since sz   Use to work as Retail buyer.  Applied for Medicaid.  Told they needed a letter from one of his doctors stating he is unable to work at this time.  Plan is for some XRT when he sees  radiation oncologist  Patient Active Problem List   Diagnosis Date Noted  . Acute esophagitis   . Intractable nausea and vomiting 07/04/2016  . Odynophagia 07/04/2016  . Tobacco abuse 07/04/2016  . Dehydration   . Brain metastasis ([place]) 07/02/2016  . Pulmonary nodule 06/19/2016  . Seizure ([place]) 06/17/2016  . Hx of pneumothorax 05/15/2016     Current Outpatient Prescriptions on File Prior to Visit  Medication Sig Dispense Refill  . fentaNYL (DURAGESIC - DOSED MCG/HR) 12 MCG/HR Place 1 patch (12.5 mcg total) onto the skin every 3 (three) days. 10 patch 0  . levETIRAcetam (KEPPRA) 500 MG tablet Take 1 tablet (500 mg total) by mouth 2 (two) times daily. 60 tablet 3  . nicotine (NICODERM CQ - DOSED IN MG/24 HOURS) 21 mg/24hr patch Place 1 patch (21 mg total) onto the skin daily. 28 patch 0  . ondansetron (ZOFRAN) 8 MG tablet Take 1 tablet (8 mg total) by mouth every 8 (eight) hours as needed for nausea or vomiting. 30 tablet 0  . ranitidine (ZANTAC 75) 75 MG tablet Take 1 tablet (75 mg total) by mouth 2 (two) times daily. 30 tablet 0  . dexamethasone (DECADRON) 4 MG tablet Take 1 tablet (4 mg total) by mouth 2 (two) times daily with a meal. Match program 60 tablet 0  . ibuprofen (ADVIL,MOTRIN) 200 MG tablet Take 400 mg by mouth every 6 (six) hours as needed for mild pain.  No current facility-administered medications on file prior to visit.     Allergies  Allergen Reactions  . Latex     UNSPECIFIED REACTION     Social History   Social History  . Marital status: Married    Spouse name: N/A  . Number of children: N/A  . Years of education: N/A   Occupational History  . Not on file.   Social History Main Topics  . Smoking status: Current Every Day Smoker  . Smokeless tobacco: Never Used     Comment: attempting to stop  . Alcohol use No     Comment: 2-3 cans of beer daily  . Drug use: No  . Sexual activity: Not on file   Other Topics Concern  . Not on file   Social  History Narrative  . No narrative on file    Family History  Problem Relation Age of Onset  . Hypertension Mother   . Thyroid cancer Mother   . Hypertension Father     Past Surgical History:  Procedure Laterality Date  . APPLICATION OF CRANIAL NAVIGATION N/A 07/11/2016   Procedure: APPLICATION OF CRANIAL NAVIGATION;  Surgeon: Kevan Ny Ditty, MD;  Location: [place];  Service: Neurosurgery;  Laterality: N/A;  . CRANIOTOMY N/A 07/11/2016   Procedure: Right Temporal craniotomy with brainlab;  Surgeon: Kevan Ny Ditty, MD;  Location: [place];  Service: Neurosurgery;  Laterality: N/A;  Right Temporal   . ESOPHAGOGASTRODUODENOSCOPY N/A 07/09/2016   Procedure: ESOPHAGOGASTRODUODENOSCOPY (EGD);  Surgeon: Jerene Bears, MD;  Location[facility] ENDOSCOPY;  Service: Endoscopy;  Laterality: N/A;  . SHOULDER SURGERY Right   . VIDEO BRONCHOSCOPY WITH ENDOBRONCHIAL NAVIGATION N/A 06/20/2016   Procedure: VIDEO BRONCHOSCOPY WITH ENDOBRONCHIAL NAVIGATION;  Surgeon: Collene Gobble, MD;  Location: [facility] OR;  Service: Thoracic;  Laterality: N/A;    ROS: Review of Systems  All other systems reviewed and are negative.   PHYSICAL EXAM: BP 133/82 (BP Location: Right Arm, Patient Position: Sitting, Cuff Size: Normal)   Pulse 93   Temp 98.9 F (37.2 C) (Oral)   Resp 20   Ht _0  (1.778 m)   Wt 135 lb 9.6 oz (61.5 kg)   SpO2 98%   BMI 19.46 kg/m   General appearance - alert, well appearing, middle age AAM and in no distress Mental status - Oriented. Quite with flat affect Mouth - white thrush noted on checks and hard palate Neck - supple, no significant adenopathy Chest - clear to auscultation, no wheezes, rales or rhonchi, symmetric air entry Heart - normal rate, regular rhythm, normal S1, S2, no murmurs, rubs, clicks or gallops Neurological - alert, oriented, normal speech, no focal findings or movement disorder noted Musculoskeletal - Knees: no edema or tenderness.  Good ROM Extremities - 1+ ankle  edema BL Skin - mild peeling skin on palms. Head: horizontal healed surgical incision RT parietal lobe   ASSESSMENT AND PLAN: 1. Brain tumor ([place]) -primary vs mets from lung to be determined -pt to keep appt with neurosurgeon next wk for removal of sutures/staples.  Will leave taper of decadron to Dr. Cyndy Freeze -pt advised that even though he has legitimate cancer dx, only one provider should be managing his pain meds given they are controlled substances and can be habit forming.  I gave him a bridge of Oxycodone to use for breakthrough pain until he sees PA, [facility] who agrees to help with pain management. -pt high risk given hx of polysubstance abuse and several resent prescribers. -  Oxycodone HCl 10 MG TABS; Take 1 tablet (10 mg total) by mouth 2 (two) times daily as needed.  Dispense: 60 tablet; Refill: 0 - Comprehensive metabolic panel - CBC  2. Thrush, oral - nystatin (MYCOSTATIN) 100000 UNIT/ML suspension; Take 5 mLs (500,000 Units total) by mouth 4 (four) times daily. Swish and swallow.  Dispense: 60 mL; Refill: 0  3. Leg pain, bilateral -DDX include peripheral neuropathy due to hx of ETOH abuse.  Check B12 and Folate level.  To consider adding Neurontin if B12 level ok - Oxycodone HCl 10 MG TABS; Take 1 tablet (10 mg total) by mouth 2 (two) times daily as needed.  Dispense: 60 tablet; Refill: 0 - Vitamin B12 - Folate  4. Polysubstance abuse -Advised total abstinence.  He states this is the plan.  He declines referral to local agency for substance abuse counselling but I will have our SW touch base with him - folic acid (FOLVITE) 1 MG tablet; Take 1 tablet (1 mg total) by mouth daily.  Dispense: 100 tablet; Refill: 1 - thiamine (VITAMIN B-1) 100 MG tablet; Take 1 tablet (100 mg total) by mouth daily.  Dispense: 100 tablet; Refill: 0  5. Nausea and vomiting, intractability of vomiting not specified, unspecified vomiting type - sucralfate (CARAFATE) 1 GM/10ML suspension; Take 10 mLs  (1 g total) by mouth 4 (four) times daily -  with meals and at bedtime. Match program  Dispense: 420 mL; Refill: 3 - pantoprazole (PROTONIX) 40 MG tablet; Take 1 tablet (40 mg total) by mouth 2 (two) times daily. Match program  Dispense: 60 tablet; Refill: 2  Patient was given the opportunity to ask questions.  Patient verbalized understanding of the plan and was able to repeat key elements of the plan.   Orders Placed This Encounter  Procedures  . Vitamin B12  . Folate  . Comprehensive metabolic panel  . CBC     Requested Prescriptions   Signed Prescriptions Disp Refills  . sucralfate (CARAFATE) 1 GM/10ML suspension 420 mL 3    Sig: Take 10 mLs (1 g total) by mouth 4 (four) times daily -  with meals and at bedtime. Match program  . folic acid (FOLVITE) 1 MG tablet 100 tablet 1    Sig: Take 1 tablet (1 mg total) by mouth daily.  . pantoprazole (PROTONIX) 40 MG tablet 60 tablet 2    Sig: Take 1 tablet (40 mg total) by mouth 2 (two) times daily. Match program  . Oxycodone HCl 10 MG TABS 60 tablet 0    Sig: Take 1 tablet (10 mg total) by mouth 2 (two) times daily as needed.  . thiamine (VITAMIN B-1) 100 MG tablet 100 tablet 0    Sig: Take 1 tablet (100 mg total) by mouth daily.  [name] nystatin (MYCOSTATIN) 100000 UNIT/ML suspension 60 mL 0    Sig: Take 5 mLs (500,000 Units total) by mouth 4 (four) times daily. Swish and swallow.    Return in about 2 months (around 09/23/2016).  Karle Plumber, MD, FACP

## 2016-07-24 NOTE — Patient Instructions (Signed)
Keep [place] AND ONCOLOGIST. I HAVE GIVEN A LIMITED REFILL ON OXYCODONE TO LAST UNTIL YOU SEE THE ONCOLOGIST.

## 2016-07-25 ENCOUNTER — Other Ambulatory Visit: Payer: Self-pay | Admitting: Radiation Therapy

## 2016-07-25 ENCOUNTER — Telehealth: Payer: Self-pay | Admitting: Internal Medicine

## 2016-07-25 DIAGNOSIS — C7931 Secondary malignant neoplasm of brain: Secondary | ICD-10-CM

## 2016-07-25 NOTE — Telephone Encounter (Signed)
PT wife call to speak with the nurse since, her husband came to the see the PCP on 07/24/16 and she supposed made a letter for Social service to helping with the rent, according to them the nurse supposed call her back , still no call yet, please follow up

## 2016-07-25 NOTE — Telephone Encounter (Signed)
Are you familiar with the conversation taking place yesterday?

## 2016-07-25 NOTE — Telephone Encounter (Addendum)
Pt informed Probation officer that she discussed with Dr. [name] the need for a letter for employer from [place], informing that patient is unable to go back to work due to health. Did not relay message to me in reference to rent.  Dr. [name] aware of message, also gave her  the fax number on a paper where to send letter when completed.  Will f/u on letter with Dr. [name] when she is in office tomorrow. Mr. Youtsey aware.

## 2016-07-26 NOTE — Telephone Encounter (Signed)
Late entry: faxed letter to given number provided by patient, [phone]  and mailed letter to home address at [phone]. Transmission log successful. Pt aware.

## 2016-07-26 NOTE — Telephone Encounter (Signed)
Letter completed and routed to [place].  Please print. Pt can pick up.

## 2016-07-31 ENCOUNTER — Ambulatory Visit: Payer: Medicaid Other | Admitting: Radiation Oncology

## 2016-07-31 ENCOUNTER — Ambulatory Visit: Payer: Medicaid Other

## 2016-08-01 ENCOUNTER — Telehealth: Payer: Self-pay | Admitting: Internal Medicine

## 2016-08-01 ENCOUNTER — Ambulatory Visit (HOSPITAL_COMMUNITY): Payer: Self-pay

## 2016-08-01 NOTE — Telephone Encounter (Signed)
Patient's wife called the office requesting a refill on nystatin (MYCOSTATIN) 100000 UNIT/ML suspension. Pt call rx to [facility] in [place].   Thank you.

## 2016-08-02 ENCOUNTER — Telehealth: Payer: Self-pay | Admitting: *Deleted

## 2016-08-02 ENCOUNTER — Other Ambulatory Visit: Payer: Self-pay | Admitting: Internal Medicine

## 2016-08-02 DIAGNOSIS — B370 Candidal stomatitis: Secondary | ICD-10-CM

## 2016-08-02 MED ORDER — NYSTATIN 100000 UNIT/ML MT SUSP: 5.0000 mL | Freq: Four times a day (QID) | OROMUCOSAL | 0 refills | Status: DC

## 2016-08-02 NOTE — Telephone Encounter (Signed)
If he'd like to go back to the 25 mcg we can do that. He can use two patches of the 12.5 mcg until he runs out then we can go back to the 25 mcg patch. That was a pharmacy error initially, but that should help and keep him from needing more than BID oxycodone

## 2016-08-02 NOTE — Telephone Encounter (Signed)
Called and spoke with the wife per Shona Simpson, PA-C , patient can double his fentanyl patch  From 12.5 to make it 58mcg, and when he is on last patch call and rx refill can be written for him tio pick up at nursing in radiation oncology dept, to maintain the oxycodone hcl 10mg  bid, wife stated she would let patient know  11:49 AM   .

## 2016-08-02 NOTE — Telephone Encounter (Signed)
Returned vm  To the patient requesting increase in pain medication  States when his Fentanyl patch was decreased to 12.15mcg from 52mcg, the Oxycodone  hcl 10mg  bid  Is not managing his pain well, he request that Eric Mason know of his pain in his head and leg pain/cramps , will call patient back  After speaking with Eric Mason 11:25 AM

## 2016-08-02 NOTE — Progress Notes (Signed)
I called and spoke with pt today. Regarding RF on Nystatin swish and swallow.  Pt states he was getting better but ran out of the Nystatin and throat  getting worse again.  States I had given only enough for several days. New rxn sent with enough for 10-12 days.

## 2016-08-03 ENCOUNTER — Ambulatory Visit: Payer: Medicaid Other | Admitting: Radiation Oncology

## 2016-08-07 ENCOUNTER — Telehealth: Payer: Self-pay | Admitting: *Deleted

## 2016-08-07 ENCOUNTER — Ambulatory Visit
Admission: RE | Admit: 2016-08-07 | Discharge: 2016-08-07 | Disposition: A | Discharge status: Home / Self Care | Payer: Medicaid Other | Source: Ambulatory Visit | Attending: Radiation Oncology | Admitting: Radiation Oncology

## 2016-08-07 ENCOUNTER — Other Ambulatory Visit: Payer: Self-pay | Admitting: Radiation Oncology

## 2016-08-07 DIAGNOSIS — Z510 Encounter for antineoplastic radiation therapy: Secondary | ICD-10-CM | POA: Diagnosis present

## 2016-08-07 DIAGNOSIS — Z79899 Other long term (current) drug therapy: Secondary | ICD-10-CM | POA: Diagnosis not present

## 2016-08-07 DIAGNOSIS — R569 Unspecified convulsions: Secondary | ICD-10-CM | POA: Diagnosis not present

## 2016-08-07 DIAGNOSIS — C7931 Secondary malignant neoplasm of brain: Secondary | ICD-10-CM

## 2016-08-07 DIAGNOSIS — F17210 Nicotine dependence, cigarettes, uncomplicated: Secondary | ICD-10-CM | POA: Diagnosis not present

## 2016-08-07 MED ORDER — FENTANYL 25 MCG/HR TD PT72: 25.0000 ug | MEDICATED_PATCH | TRANSDERMAL | 0 refills | Status: DC

## 2016-08-07 MED ORDER — SODIUM CHLORIDE 0.9% FLUSH
10.0000 mL | Freq: Once | INTRAVENOUS | Status: AC
  Administered 2016-08-07: 10 mL via INTRAVENOUS

## 2016-08-07 NOTE — Progress Notes (Signed)
Has armband been applied?  YES Does patient have an allergy to IV contrast dye?: NO   Has patient ever received premedication for IV contrast dye?: NO  Does patient take metformin?: NO  If patient does take metformin when was the last dose:not diabetic  Date of lab work: 07/12/16 BUN: 12 CR: 0.54  IV site:Rforearm, attempted x 2 unsuccessful by me, asked Lamar Benes RN to try, patient stated"they have a hard time getting IV on me',   Has IV site been added to flowsheet?  yes

## 2016-08-07 NOTE — Telephone Encounter (Addendum)
,  Called and spoke with patient, he can pick up rx fentanyl her eat nursing today before 5pm or tomorrowhe will pick up at nursing rad onc tomorrow,thanked for the call 3:38 PM

## 2016-08-07 NOTE — Progress Notes (Signed)
  Radiation Oncology         ([phone]) [phone] ________________________________  Name: Eric Mason MRN: [id number]  Date: 08/07/2016  DOB: February 09, 1971  DIAGNOSIS:     ICD-10-CM   1. Brain metastasis ([place]) C79.31    Putative stage IV lung cancer  NARRATIVE:  The patient was brought to the [facility].  Identity was confirmed.  All relevant records and images related to the planned course of therapy were reviewed.  The patient freely provided informed written consent to proceed with treatment after reviewing the details related to the planned course of therapy. The consent form was witnessed and verified by the simulation staff. Intravenous access was established for contrast administration. Then, the patient was set-up in a stable reproducible supine position for radiation therapy.  A relocatable thermoplastic stereotactic head frame was fabricated for precise immobilization.  CT images were obtained.  Surface markings were placed.  The CT images were loaded into the planning software and fused with the patient's targeting MRI scan.  Then the target and avoidance structures were contoured.  Treatment planning then occurred.  The radiation prescription was entered and confirmed.  I have requested 3D planning  I have requested a DVH of the following structures: Brain stem, brain, left eye, right eye, lenses, optic chiasm, target volumes, uninvolved brain, and normal tissue.    SPECIAL TREATMENT PROCEDURE:  The planned course of therapy using radiation constitutes a special treatment procedure. Special care is required in the management of this patient for the following reasons. This treatment constitutes a Special Treatment Procedure for the following reason: High dose per fraction requiring special monitoring for increased toxicities of treatment including daily imaging.  The special nature of the planned course of radiotherapy will require increased physician supervision and oversight  to ensure patient's safety with optimal treatment outcomes.  PLAN:  The patient will receive 24 Gy in 3 fraction.   ------------------------------------------------  Jodelle Gross, MD, PhD  This document serves as a record of services personally performed by Kyung Rudd, MD. It was created on his behalf by Bethann Humble, a trained medical scribe. The creation of this record is based on the scribe's personal observations and the provider's statements to them. This document has been checked and approved by the attending provider.

## 2016-08-07 NOTE — Telephone Encounter (Signed)
Pt aware to f/u with Neurology. Scheduled apt with Dr. [name].

## 2016-08-07 NOTE — Telephone Encounter (Signed)
Called and spokew ith patient, his appt was at 1015, he stated he was sorry was on [address] will be here about 20 minutes", called Aaron Edelman RT [place] therapist to let him know of delay  10:26 AM

## 2016-08-08 ENCOUNTER — Ambulatory Visit (HOSPITAL_COMMUNITY)
Admission: RE | Admit: 2016-08-08 | Discharge: 2016-08-08 | Disposition: A | Discharge status: Home / Self Care | Payer: Medicaid Other | Source: Ambulatory Visit | Attending: Radiation Oncology | Admitting: Radiation Oncology

## 2016-08-08 ENCOUNTER — Other Ambulatory Visit: Payer: Self-pay | Admitting: Urology

## 2016-08-08 DIAGNOSIS — Z9889 Other specified postprocedural states: Secondary | ICD-10-CM | POA: Diagnosis not present

## 2016-08-08 DIAGNOSIS — Z510 Encounter for antineoplastic radiation therapy: Secondary | ICD-10-CM | POA: Diagnosis not present

## 2016-08-08 DIAGNOSIS — C7931 Secondary malignant neoplasm of brain: Secondary | ICD-10-CM

## 2016-08-08 MED ORDER — OXYCODONE HCL 5 MG PO TABS: 5.0000 mg | ORAL_TABLET | ORAL | 0 refills | Status: DC | PRN

## 2016-08-08 MED ORDER — GADOBENATE DIMEGLUMINE 529 MG/ML IV SOLN
15.0000 mL | Freq: Once | INTRAVENOUS | Status: AC
  Administered 2016-08-08: 15 mL via INTRAVENOUS

## 2016-08-09 DIAGNOSIS — Z510 Encounter for antineoplastic radiation therapy: Secondary | ICD-10-CM | POA: Diagnosis not present

## 2016-08-10 ENCOUNTER — Ambulatory Visit
Admission: RE | Admit: 2016-08-10 | Discharge: 2016-08-10 | Disposition: A | Discharge status: Home / Self Care | Payer: Medicaid Other | Source: Ambulatory Visit | Attending: Radiation Oncology | Admitting: Radiation Oncology

## 2016-08-10 ENCOUNTER — Encounter: Payer: Self-pay | Admitting: Radiation Oncology

## 2016-08-10 VITALS — BP 177/106 | HR 76 | Temp 98.2°F | Resp 20

## 2016-08-10 DIAGNOSIS — C7931 Secondary malignant neoplasm of brain: Secondary | ICD-10-CM

## 2016-08-10 DIAGNOSIS — Z510 Encounter for antineoplastic radiation therapy: Secondary | ICD-10-CM | POA: Diagnosis not present

## 2016-08-10 NOTE — Progress Notes (Signed)
Patient  Is on decadron he thinks 4mg  bid, taking MMW for thrush, requesting to go back on Fentanyl patch 65mcg,  Will need to bring back other pain  rx  And exchange per Ashyln Bruning, PA-C, pat8ient given food bag, his food stamps will go into effect 08/24/16 stated, he is in financial  Problems , no driving today, male significant other with him , monitored 15 minutes, no dizzy ness or nausea stated, ambulatory out the door ,steady gait

## 2016-08-10 NOTE — Addendum Note (Signed)
Addendum  created 08/10/16 1434 by Rica Koyanagi, MD   Sign clinical note

## 2016-08-10 NOTE — Op Note (Signed)
  Name: Eric Mason  MRN: [id number]  Date: 08/10/2016   DOB: [birth year]  Stereotactic Radiosurgery Operative Note  PRE-OPERATIVE DIAGNOSIS:  Solitary Brain Metastasis  POST-OPERATIVE DIAGNOSIS:  Solitary Brain Metastasis  PROCEDURE:  Stereotactic Radiosurgery  SURGEON:  Kevan Ny Ditty, MD  NARRATIVE: The patient underwent a radiation treatment planning session in the radiation oncology simulation suite under the care of the radiation oncology physician and physicist.  I participated closely in the radiation treatment planning afterwards. The patient underwent planning CT which was fused to 3T high resolution MRI with 1 mm axial slices.  These images were fused on the planning system.  We contoured the gross target volumes and subsequently expanded this to yield the Planning Target Volume. I actively participated in the planning process.  I helped to define and review the target contours and also the contours of the optic pathway, eyes, brainstem and selected nearby organs at risk.  All the dose constraints for critical structures were reviewed and compared to AAPM Task Group 101.  The prescription dose conformity was reviewed.  I approved the plan electronically.    Accordingly, [name] was brought to the TrueBeam stereotactic radiation treatment linac and placed in the custom immobilization mask.  The patient was aligned according to the IR fiducial markers with BrainLab Exactrac, then orthogonal x-rays were used in ExacTrac with the 6DOF robotic table and the shifts were made to align the patient  [name] received stereotactic radiosurgery uneventfully.    The detailed description of the procedure is recorded in the radiation oncology procedure note.  I was present for the duration of the procedure.  DISPOSITION:  Following delivery, the patient was transported to nursing in stable condition and monitored for possible acute effects to be discharged to home in  stable condition with follow-up in one month.  Kevan Ny Ditty, MD 08/10/2016 12:17 PM

## 2016-08-13 ENCOUNTER — Other Ambulatory Visit: Payer: Self-pay

## 2016-08-13 ENCOUNTER — Telehealth: Payer: Self-pay | Admitting: *Deleted

## 2016-08-13 ENCOUNTER — Other Ambulatory Visit: Payer: Self-pay | Admitting: Radiation Oncology

## 2016-08-13 ENCOUNTER — Telehealth: Payer: Self-pay | Admitting: Internal Medicine

## 2016-08-13 DIAGNOSIS — R112 Nausea with vomiting, unspecified: Secondary | ICD-10-CM

## 2016-08-13 DIAGNOSIS — C7931 Secondary malignant neoplasm of brain: Secondary | ICD-10-CM

## 2016-08-13 MED ORDER — DEXAMETHASONE 4 MG PO TABS: 4.0000 mg | ORAL_TABLET | Freq: Two times a day (BID) | ORAL | 0 refills | Status: DC

## 2016-08-13 MED ORDER — PANTOPRAZOLE SODIUM 40 MG PO TBEC: 40.0000 mg | DELAYED_RELEASE_TABLET | Freq: Two times a day (BID) | ORAL | 2 refills | Status: DC

## 2016-08-13 MED FILL — DEXAMETHASONE 4 MG TABLET: 4 | 30 days supply | Qty: 60 | Fill #0

## 2016-08-13 MED FILL — ?PANTOPRAZOLE SOD DR 40MG: 40 MG | 30 days supply | Qty: 60 | Fill #0 | Status: TO

## 2016-08-13 NOTE — Telephone Encounter (Signed)
Patient called requesting medication refill on dexamethasone (DECADRON) 4 MG tablet, (patient states he took last pill this morning). Also needs refill on pantoprazole (PROTONIX) 40 MG tablet and states medication that was given for thrush did not work and now has it outside of mouth. Please f/up

## 2016-08-13 NOTE — Telephone Encounter (Signed)
Will defer to Dr. [name] for appropriateness of request

## 2016-08-13 NOTE — Telephone Encounter (Addendum)
Patient called requesting refill on decadron to be called to  His [facility] , he has run out today, his rx was 4mg  oral bid, , his next [facility] tx is tomorrow #2, will notify Shona Simpson, PA-C as Dr. Lisbeth Renshaw is off and will call the patient 3:10 PM  3:10 PM

## 2016-08-13 NOTE — Telephone Encounter (Signed)
called the patient his rx decadron was refilled by Shona Simpson and sent to [facility] on [address], patient thanked this RN And to thank Bryson Ha  For filling his decadrn  3:53 PM

## 2016-08-13 NOTE — Telephone Encounter (Signed)
Contacted pt to make are of his refill for his protonix. Pt is aware that he will need to call the neurosurgeon or oncologist for the refill for the steroid. Pt states he understands and doesn't have any questions or concerns

## 2016-08-14 ENCOUNTER — Ambulatory Visit
Admission: RE | Admit: 2016-08-14 | Discharge: 2016-08-14 | Disposition: A | Discharge status: Home / Self Care | Payer: Medicaid Other | Source: Ambulatory Visit | Attending: Radiation Oncology | Admitting: Radiation Oncology

## 2016-08-14 ENCOUNTER — Encounter: Payer: Self-pay | Admitting: Radiation Oncology

## 2016-08-14 VITALS — BP 152/96 | HR 75 | Temp 98.7°F | Resp 20

## 2016-08-14 DIAGNOSIS — C7931 Secondary malignant neoplasm of brain: Secondary | ICD-10-CM

## 2016-08-14 DIAGNOSIS — R112 Nausea with vomiting, unspecified: Secondary | ICD-10-CM

## 2016-08-14 DIAGNOSIS — Z510 Encounter for antineoplastic radiation therapy: Secondary | ICD-10-CM | POA: Diagnosis not present

## 2016-08-14 MED ORDER — OXYCODONE HCL 15 MG PO TABS: 15.0000 mg | ORAL_TABLET | Freq: Four times a day (QID) | ORAL | 0 refills | Status: DC | PRN

## 2016-08-14 MED ORDER — SUCRALFATE 1 GM/10ML PO SUSP: 1.0000 g | Freq: Three times a day (TID) | ORAL | 3 refills | Status: DC

## 2016-08-14 MED ORDER — FLUCONAZOLE 150 MG PO TABS: 150.0000 mg | ORAL_TABLET | Freq: Every day | ORAL | 0 refills | Status: DC

## 2016-08-14 MED FILL — FLUCONAZOLE 150 MG TABLET: 150 | 3 days supply | Qty: 3 | Fill #0

## 2016-08-14 NOTE — Patient Instructions (Signed)
Starting Friday 08/17/16 Take Dexamethasone 4 mg, 1/2 of this tablet by mouth twice a day for two weeks Starting Friday 08/31/16 Take Dexamethasone 4 mg, 1/2 of this tablet by mouth daily for two weeks Starting Friday 09/14/16 Take Dexamethasone 4mg , 1/2 of this tablet every other day for one week Then stop

## 2016-08-14 NOTE — Progress Notes (Signed)
Monitor for headache,dizzyness, blurred vision, pain, nausea, only c/o pain is in his legs 7/10 scale, and mild h/a, he is out of his long acting oxycodone and short acting pain med, wants refill also on nystatin is out of that also, paged Bryson Ha to see patient, gave fentanyl patch rx to the patient 1:08 PM

## 2016-08-15 ENCOUNTER — Encounter: Payer: Self-pay | Admitting: Radiation Oncology

## 2016-08-15 NOTE — Progress Notes (Signed)
Mr. Yellin was seen today following SRS treatment #2/3. He will finish postop radiotherapy to the brain on Thursday. He comes around to clinic with concerns of persistent oral candida despite nystatin suspension. He reports he also has been without his fentanyl patch for the last three days and would like to stop using this due to cost. He has been pleased with the relief he has with Oxycodone. His vitals were reviewed and stable. His oropharynx has multiple white plaques on the tongue and upper palate consistent with candida. We discussed oral fluconazole 150 mg one tab po every other day x 3 days. We also discussed increasing his Oxycodone to 15 mg q 6 hours, and that we would taper this as well. We will anticipate that he would decrease this once he starts on chemotherapy as his pain complaints are in his legs and head along his resection site. 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 He is needing to meet with medical oncology as well. A referral to Dr. Julien Nordmann was placed. He will contact us with questions or concerns, but was also given  steroid taper instructions once he completes his third fraction of SRS (as a part of his AVS.)    Carola Rhine, PAC

## 2016-08-16 ENCOUNTER — Ambulatory Visit
Admission: RE | Admit: 2016-08-16 | Discharge: 2016-08-16 | Disposition: A | Discharge status: Home / Self Care | Payer: Medicaid Other | Source: Ambulatory Visit | Attending: Radiation Oncology | Admitting: Radiation Oncology

## 2016-08-16 ENCOUNTER — Encounter: Payer: Self-pay | Admitting: *Deleted

## 2016-08-16 ENCOUNTER — Encounter: Payer: Self-pay | Admitting: Radiation Oncology

## 2016-08-16 VITALS — BP 181/98 | HR 81 | Temp 98.1°F

## 2016-08-16 DIAGNOSIS — C7931 Secondary malignant neoplasm of brain: Secondary | ICD-10-CM

## 2016-08-16 DIAGNOSIS — Z510 Encounter for antineoplastic radiation therapy: Secondary | ICD-10-CM | POA: Diagnosis not present

## 2016-08-16 NOTE — Progress Notes (Signed)
Oncology Nurse Navigator Documentation  Oncology Nurse Navigator Flowsheets 08/16/2016  Navigator Location [facility]  Navigator Encounter Type Other/I received information on Eric Mason that he did not show for appt with her yesterday.  She asked that I look into it.  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  I updated Dr. Julien Nordmann and states patient needs to be seen by Dr. Alen Blew.  I updated Seth Bake to call patient and schedule with him.   Barriers/Navigation Needs Coordination of Care  Interventions Coordination of Care  Acuity Level 2  Time Spent with Patient 30

## 2016-08-16 NOTE — Progress Notes (Signed)
  Radiation Oncology         [phone]) [phone] ________________________________  Name: Luca Dyar MRN: [id number]  Date: 08/10/2016  DOB: Jun 19, 1970   SPECIAL TREATMENT PROCEDURE   3D TREATMENT PLANNING AND DOSIMETRY: The patient's radiation plan was reviewed and approved by Dr. Cyndy Freeze from neurosurgery and radiation oncology prior to treatment. It showed 3-dimensional radiation distributions overlaid onto the planning CT/MRI image set. The [facility] for the target structures as well as the organs at risk were reviewed. The documentation of the 3D plan and dosimetry are filed in the radiation oncology EMR.   NARRATIVE: The patient was brought to the TrueBeam stereotactic radiation treatment machine and placed supine on the CT couch. The head frame was applied, and the patient was set up for stereotactic radiosurgery. Neurosurgery was present for the set-up and delivery   SIMULATION VERIFICATION: In the couch zero-angle position, the patient underwent Exactrac imaging using the Brainlab system with orthogonal KV images. These were carefully aligned and repeated to confirm treatment position for each of the isocenters. The Exactrac snap film verification was repeated at each couch angle.   SPECIAL TREATMENT PROCEDURE: The patient received stereotactic radiosurgery to the following target:  PTV1 Rt hemisphere target was treated using 4 Arcs to a prescription dose of 8 Gy. ExacTrac Snap verification was performed for each couch angle.   STEREOTACTIC TREATMENT MANAGEMENT: Following delivery, the patient was transported to nursing in stable condition and monitored for possible acute effects. Vital signs were recorded . The patient tolerated treatment without significant acute effects, and was discharged to home in stable condition.  PLAN: The patient will return for his next fraction of radiation treatment early next week.   ------------------------------------------------  Jodelle Gross, MD, PhD

## 2016-08-16 NOTE — Progress Notes (Signed)
  Radiation Oncology         [phone]) [phone] ________________________________  Name: Eric Mason MRN: [id number]  Date: 08/14/2016  DOB: October 28, 1970   SPECIAL TREATMENT PROCEDURE   3D TREATMENT PLANNING AND DOSIMETRY: The patient's radiation plan was reviewed and approved by Dr. Cyndy Freeze from neurosurgery and radiation oncology prior to treatment. It showed 3-dimensional radiation distributions overlaid onto the planning CT/MRI image set. The [facility] for the target structures as well as the organs at risk were reviewed. The documentation of the 3D plan and dosimetry are filed in the radiation oncology EMR.   NARRATIVE: The patient was brought to the TrueBeam stereotactic radiation treatment machine and placed supine on the CT couch. The head frame was applied, and the patient was set up for stereotactic radiosurgery. Neurosurgery was present for the set-up and delivery   SIMULATION VERIFICATION: In the couch zero-angle position, the patient underwent Exactrac imaging using the Brainlab system with orthogonal KV images. These were carefully aligned and repeated to confirm treatment position for each of the isocenters. The Exactrac snap film verification was repeated at each couch angle.   SPECIAL TREATMENT PROCEDURE: The patient received stereotactic radiosurgery to the following target:  PTV1 Rt hemisphere target was treated using 4 Arcs to a prescription dose of 8 Gy. ExacTrac Snap verification was performed for each couch angle.   STEREOTACTIC TREATMENT MANAGEMENT: Following delivery, the patient was transported to nursing in stable condition and monitored for possible acute effects. Vital signs were recorded . The patient tolerated treatment without significant acute effects, and was discharged to home in stable condition.  PLAN: The patient will return for his final fraction on 08/16/2016   ------------------------------------------------  Jodelle Gross, MD, PhD

## 2016-08-16 NOTE — Progress Notes (Signed)
  Radiation Oncology         [phone]) [phone] ________________________________  Name: Eric Mason MRN: [id number]  Date: 08/16/2016  DOB: 1970/08/01   SPECIAL TREATMENT PROCEDURE   3D TREATMENT PLANNING AND DOSIMETRY: The patient's radiation plan was reviewed and approved by Dr. Cyndy Freeze from neurosurgery and radiation oncology prior to treatment. It showed 3-dimensional radiation distributions overlaid onto the planning CT/MRI image set. The [facility] for the target structures as well as the organs at risk were reviewed. The documentation of the 3D plan and dosimetry are filed in the radiation oncology EMR.   NARRATIVE: The patient was brought to the TrueBeam stereotactic radiation treatment machine and placed supine on the CT couch. The head frame was applied, and the patient was set up for stereotactic radiosurgery. Neurosurgery was present for the set-up and delivery   SIMULATION VERIFICATION: In the couch zero-angle position, the patient underwent Exactrac imaging using the Brainlab system with orthogonal KV images. These were carefully aligned and repeated to confirm treatment position for each of the isocenters. The Exactrac snap film verification was repeated at each couch angle.   SPECIAL TREATMENT PROCEDURE: The patient received stereotactic radiosurgery to the following target:  PTV1 right hemisphere  target was treated using 4 Arcs to a prescription dose of 8 Gy. ExacTrac Snap verification was performed for each couch angle.   STEREOTACTIC TREATMENT MANAGEMENT: Following delivery, the patient was transported to nursing in stable condition and monitored for possible acute effects. Vital signs were recorded . The patient tolerated treatment without significant acute effects, and was discharged to home in stable condition.  PLAN: Follow-up in one month. Instructions have been given regarding tapering his steroids over the next several  weeks.   ------------------------------------------------  Jodelle Gross, MD, PhD

## 2016-08-17 NOTE — Addendum Note (Signed)
Addendum  created 08/17/16 1155 by Lyn Hollingshead, MD   Sign clinical note

## 2016-08-22 ENCOUNTER — Telehealth: Payer: Self-pay

## 2016-08-22 NOTE — Telephone Encounter (Signed)
Pt called to confirm appt time tomorrow and to find out where to go within [facility]. Done.

## 2016-08-23 ENCOUNTER — Telehealth: Payer: Self-pay | Admitting: Oncology

## 2016-08-23 ENCOUNTER — Ambulatory Visit (HOSPITAL_BASED_OUTPATIENT_CLINIC_OR_DEPARTMENT_OTHER): Payer: Medicaid Other | Admitting: Oncology

## 2016-08-23 VITALS — BP 161/88 | HR 90 | Temp 98.4°F | Resp 18 | Ht 70.0 in | Wt 151.3 lb

## 2016-08-23 DIAGNOSIS — C801 Malignant (primary) neoplasm, unspecified: Secondary | ICD-10-CM

## 2016-08-23 DIAGNOSIS — C7931 Secondary malignant neoplasm of brain: Secondary | ICD-10-CM

## 2016-08-23 DIAGNOSIS — Z720 Tobacco use: Secondary | ICD-10-CM

## 2016-08-23 DIAGNOSIS — R911 Solitary pulmonary nodule: Secondary | ICD-10-CM

## 2016-08-23 DIAGNOSIS — Z808 Family history of malignant neoplasm of other organs or systems: Secondary | ICD-10-CM
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
## 2016-08-23 NOTE — Telephone Encounter (Signed)
Appointments scheduled per 08/23/16 los. Patient was given a copy of the AVS report and appointment schedule per 08/23/16 los.

## 2016-08-23 NOTE — Progress Notes (Incomplete)
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  Beams/energy:   SRS/SBRT/SRT-3D // 6 MV Photon  Narrative: The patient tolerated radiation treatment relatively well. The patient is on Decadron 4mg  BID and was taking MMW and Diflucan for thrush. The patient had bilateral leg pain as a 7/10, mild headaches, and pain along his resection site.  Plan: The patient has completed radiation treatment. The patient will return to radiation oncology clinic for routine followup in one month. I advised them to call or return sooner if they have any questions or concerns related to their recovery or treatment.  ------------------------------------------------  Jodelle Gross, MD, PhD  This document serves as a record of services personally performed by Kyung Rudd, MD. It was created on his behalf by Darcus Austin, a trained medical scribe. The creation of this record is based on the scribe's personal observations and the provider's statements to them. This document has been checked and approved by the attending provider.

## 2016-08-30 ENCOUNTER — Telehealth: Payer: Self-pay | Admitting: *Deleted

## 2016-08-30 NOTE — Telephone Encounter (Signed)
Patient called, stating he is taking 2mg  decadron 2x day, has been tapered from 4mg  bid, and headaches back to an 8/10, along with knewws swelling, back pain, bloating, nausea, " he is taking his oxycodone15mg  tablet (1.5tablet) every 6 hours and is not helping, ando difficulty getting out of bed, ,asked if he should go to the ED? He stated No wants either Bryson Ha or Dr. Lisbeth Renshaw to call , informed him it may be later in the day before I can speak with them as wee are fully booked in the clinic today, he stated he would wait for my call, thanked me for listning 9:05 AM

## 2016-08-30 NOTE — Telephone Encounter (Signed)
Called patient back, per MD to increase his decadron back to 4mg  bid x 1 week and then re-eval for h/a, also asked if he would like [place] to schedule ana ppt with Sharolyn Douglas  RN,  Palliative  Nurse to discuss pain regimen, , patient agreed for pain mgt,  Will have him called with an appt 9:37 AM  .n

## 2016-08-30 NOTE — Telephone Encounter (Signed)
CALLED PATIENT TO INFORM OF APPT. WITH MARY [place] ON 09/03/16 @ 9 AM, SPOKE WITH PATIENT AND HE IS AWARE OF THIS APPT.

## 2016-09-03 ENCOUNTER — Telehealth: Payer: Self-pay | Admitting: Radiation Therapy

## 2016-09-03 ENCOUNTER — Ambulatory Visit: Admission: RE | Admit: 2016-09-03 | Payer: MEDICAID | Source: Ambulatory Visit

## 2016-09-03 ENCOUNTER — Encounter: Payer: Self-pay | Admitting: Radiation Therapy

## 2016-09-03 ENCOUNTER — Telehealth: Payer: Self-pay | Admitting: *Deleted

## 2016-09-03 NOTE — Progress Notes (Signed)
Sent a message to Dr. Maryjean Ka and Dr. Cyndy Freeze requesting an appointment with a pain specialist. As soon as I hear back from their office, I will let [name] know. He was originally scheduled to see Wadie Lessen with palliative care, but he preferred to see a pain specialist and she feels that his needs will be better met by a pain specialist as well.   Mont Dutton R.T.(R)(T) Special Procedures Navigator

## 2016-09-03 NOTE — Telephone Encounter (Signed)
Sharolyn Douglas came 2x to see the patient, couldn't find him, called and left vm to call us to reschedule if needed   9:56 AM

## 2016-09-03 NOTE — Telephone Encounter (Signed)
Informed Mr. Pulis of his appointment to see Dr. Maryjean Ka, pain specialist on Tuesday 6/26 @ 2:45. He was very happy for the call and plans to be there tomorrow.   Manuela Schwartz

## 2016-09-04 MED FILL — oxyCODONE HCL 5 MG TABS: 5 | 30 days supply | Qty: 50 | Fill #0

## 2016-09-04 MED FILL — GABAPENTIN 100 MG CAPSULE: 100 | 30 days supply | Qty: 90 | Fill #0

## 2016-09-04 MED FILL — fentaNYL 12 MCG/HR PT72: 12 | 30 days supply | Qty: 10 | Fill #0

## 2016-09-04 MED FILL — AMITRIPTYLINE HCL 25 MG TAB: 25 | 30 days supply | Qty: 30 | Fill #0

## 2016-09-13 ENCOUNTER — Other Ambulatory Visit: Payer: Self-pay | Admitting: Internal Medicine

## 2016-09-13 ENCOUNTER — Telehealth: Payer: Self-pay | Admitting: Internal Medicine

## 2016-09-13 DIAGNOSIS — B370 Candidal stomatitis: Secondary | ICD-10-CM

## 2016-09-13 MED ORDER — NYSTATIN 100000 UNIT/ML MT SUSP: 5.0000 mL | Freq: Four times a day (QID) | OROMUCOSAL | 0 refills | Status: DC

## 2016-09-13 NOTE — Telephone Encounter (Signed)
Will forward to PCP 

## 2016-09-13 NOTE — Telephone Encounter (Signed)
Patient's girlfriend is requesting refill for medication to help with thrush...please send [place]

## 2016-09-13 NOTE — Telephone Encounter (Signed)
RF sent on Nystatin

## 2016-09-14 ENCOUNTER — Ambulatory Visit: Payer: Medicaid Other | Admitting: Internal Medicine

## 2016-09-17 ENCOUNTER — Ambulatory Visit: Payer: Self-pay | Admitting: Radiation Oncology

## 2016-09-24 ENCOUNTER — Encounter: Payer: Self-pay | Admitting: Radiation Oncology

## 2016-09-24 ENCOUNTER — Ambulatory Visit: Payer: Medicaid Other | Admitting: Internal Medicine

## 2016-09-24 ENCOUNTER — Ambulatory Visit
Admission: RE | Admit: 2016-09-24 | Discharge: 2016-09-24 | Disposition: A | Discharge status: Home / Self Care | Payer: Medicaid Other | Source: Ambulatory Visit | Attending: Radiation Oncology | Admitting: Radiation Oncology

## 2016-09-24 DIAGNOSIS — M545 Low back pain: Secondary | ICD-10-CM | POA: Diagnosis not present

## 2016-09-24 DIAGNOSIS — R569 Unspecified convulsions: Secondary | ICD-10-CM | POA: Insufficient documentation

## 2016-09-24 DIAGNOSIS — Z09 Encounter for follow-up examination after completed treatment for conditions other than malignant neoplasm: Secondary | ICD-10-CM | POA: Diagnosis not present

## 2016-09-24 DIAGNOSIS — R112 Nausea with vomiting, unspecified: Secondary | ICD-10-CM

## 2016-09-24 DIAGNOSIS — R51 Headache: Secondary | ICD-10-CM | POA: Diagnosis not present

## 2016-09-24 DIAGNOSIS — Z9889 Other specified postprocedural states: Secondary | ICD-10-CM | POA: Diagnosis not present

## 2016-09-24 DIAGNOSIS — C7931 Secondary malignant neoplasm of brain: Secondary | ICD-10-CM

## 2016-09-24 DIAGNOSIS — Z808 Family history of malignant neoplasm of other organs or systems: Secondary | ICD-10-CM | POA: Diagnosis not present

## 2016-09-24 DIAGNOSIS — F17200 Nicotine dependence, unspecified, uncomplicated: Secondary | ICD-10-CM | POA: Diagnosis not present

## 2016-09-24 MED ORDER — PANTOPRAZOLE SODIUM 40 MG PO TBEC: 40.0000 mg | DELAYED_RELEASE_TABLET | Freq: Two times a day (BID) | ORAL | 2 refills | Status: DC

## 2016-09-24 MED ORDER — DEXAMETHASONE 4 MG PO TABS: 2.0000 mg | ORAL_TABLET | Freq: Every day | ORAL | 0 refills | Status: DC

## 2016-09-25 ENCOUNTER — Other Ambulatory Visit: Payer: Self-pay

## 2016-09-25 ENCOUNTER — Ambulatory Visit: Payer: Medicaid Other | Attending: Internal Medicine | Admitting: Internal Medicine

## 2016-09-25 ENCOUNTER — Encounter: Payer: Self-pay | Admitting: Internal Medicine

## 2016-09-25 ENCOUNTER — Ambulatory Visit (HOSPITAL_COMMUNITY)
Admission: RE | Admit: 2016-09-25 | Discharge: 2016-09-25 | Disposition: A | Discharge status: Home / Self Care | Payer: Medicaid Other | Source: Ambulatory Visit | Attending: Internal Medicine | Admitting: Internal Medicine

## 2016-09-25 VITALS — BP 137/86 | HR 95 | Temp 98.2°F | Resp 16 | Wt 164.0 lb

## 2016-09-25 DIAGNOSIS — K5909 Other constipation: Secondary | ICD-10-CM | POA: Insufficient documentation

## 2016-09-25 DIAGNOSIS — R911 Solitary pulmonary nodule: Secondary | ICD-10-CM | POA: Diagnosis not present

## 2016-09-25 DIAGNOSIS — E860 Dehydration: Secondary | ICD-10-CM | POA: Insufficient documentation

## 2016-09-25 DIAGNOSIS — R1310 Dysphagia, unspecified: Secondary | ICD-10-CM

## 2016-09-25 DIAGNOSIS — C7931 Secondary malignant neoplasm of brain: Secondary | ICD-10-CM | POA: Diagnosis not present

## 2016-09-25 DIAGNOSIS — Z8249 Family history of ischemic heart disease and other diseases of the circulatory system: Secondary | ICD-10-CM | POA: Diagnosis not present

## 2016-09-25 DIAGNOSIS — F17200 Nicotine dependence, unspecified, uncomplicated: Secondary | ICD-10-CM | POA: Insufficient documentation

## 2016-09-25 DIAGNOSIS — R739 Hyperglycemia, unspecified: Secondary | ICD-10-CM | POA: Diagnosis not present

## 2016-09-25 DIAGNOSIS — R569 Unspecified convulsions: Secondary | ICD-10-CM | POA: Diagnosis not present

## 2016-09-25 DIAGNOSIS — Z91040 Latex allergy status: Secondary | ICD-10-CM | POA: Diagnosis not present

## 2016-09-25 DIAGNOSIS — Z9889 Other specified postprocedural states: Secondary | ICD-10-CM | POA: Insufficient documentation

## 2016-09-25 DIAGNOSIS — K5903 Drug induced constipation: Secondary | ICD-10-CM

## 2016-09-25 DIAGNOSIS — R0789 Other chest pain: Secondary | ICD-10-CM | POA: Diagnosis not present

## 2016-09-25 DIAGNOSIS — Z808 Family history of malignant neoplasm of other organs or systems: Secondary | ICD-10-CM | POA: Insufficient documentation

## 2016-09-25 DIAGNOSIS — R079 Chest pain, unspecified: Secondary | ICD-10-CM | POA: Diagnosis not present

## 2016-09-25 DIAGNOSIS — K209 Esophagitis, unspecified: Secondary | ICD-10-CM | POA: Insufficient documentation

## 2016-09-25 DIAGNOSIS — Z8619 Personal history of other infectious and parasitic diseases: Secondary | ICD-10-CM | POA: Diagnosis not present

## 2016-09-25 DIAGNOSIS — R1319 Other dysphagia: Secondary | ICD-10-CM

## 2016-09-25 DIAGNOSIS — D649 Anemia, unspecified: Secondary | ICD-10-CM

## 2016-09-25 DIAGNOSIS — R112 Nausea with vomiting, unspecified: Secondary | ICD-10-CM | POA: Diagnosis not present

## 2016-09-25 DIAGNOSIS — R0609 Other forms of dyspnea: Secondary | ICD-10-CM | POA: Diagnosis not present

## 2016-09-25 DIAGNOSIS — B370 Candidal stomatitis: Secondary | ICD-10-CM

## 2016-09-25 MED ORDER — CALCIUM CARB-CHOLECALCIFEROL 500-400 MG-UNIT PO TABS: ORAL_TABLET | ORAL | 2 refills | Status: DC

## 2016-09-25 MED ORDER — NYSTATIN 100000 UNIT/ML MT SUSP: 5.0000 mL | Freq: Four times a day (QID) | OROMUCOSAL | 0 refills | Status: DC

## 2016-09-25 MED ORDER — SENNOSIDES-DOCUSATE SODIUM 8.6-50 MG PO TABS: 1.0000 | ORAL_TABLET | Freq: Every day | ORAL | 3 refills | Status: DC

## 2016-09-25 MED ORDER — SUCRALFATE 1 GM/10ML PO SUSP: 1.0000 g | Freq: Three times a day (TID) | ORAL | 0 refills | Status: DC

## 2016-09-25 NOTE — Progress Notes (Signed)
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  Interval Since Last Radiation: 6 weeks   08/16/16 SRS Treatment:  PTV1 right hemisphere  target was treated using 4 Arcs to a prescription dose of 8 Gy. ExacTrac Snap verification was performed for each couch angle.   Narrative:  The patient returns today for routine follow-up.  In summary this is a 46 y.o. gentleman without any significant comorbid conditions who  presented in April 2018 with altered mental status and seizure. Imaging studies of the brain showed a right posterior temporal mass measuring at 12 mm surrounding edema with mass effect also noted. He subsequently underwent staging workup including a PET CT scan on 07/03/2016 which showed an 11 mm right upper lobe pulmonary nodule that is not FDG avid. Additional 5 mm left lower lobe pulmonary nodule was also noted. There is a 11 mm short axis hypermetabolic AP window node was also worrisome for metastatic disease. Otherwise really no clear-cut evidence of a primary malignancy. He subsequently underwent craniotomy and resection of that mass on 07/11/2016. The pathology revealed poorly differentiated neoplasm with immunohistochemical stain performed. The stains were positive for beta hCG, CD 117 and positive for PLAP and cytokeratins. These findings suggested germ cell tumor however metastatic carcinoma cannot be completely ruled out. He subsequently completed SRS on 08/16/2016. He continues with his Keppra and is currently taking Dexamethasone 2 mg daily. He has been on this same dose for about two weeks after trying to taper down, which led to another episode of seizure like activity and headaches. He resumed his Dexamethasone at 2 mg daily.                           On review of systems, the patient states he is feeling pretty well overall at this point. He continues to have low back pain and needs to get back to see Dr. Maryjean Ka regarding this. He is currently doing well with his dose of Oxycodone at 5 mg q 6 hours prn. He also reports constipation with this and has been taking dulcolax intermittently but if he has not had a bowel movement notes some discomfort in the upper abdomen. This resolves with a bowel movement. He denies any nausea or vomiting. He is not having trouble with rectal bleeding or hematochezia. He denies chest pain or shortness of breath. He does note fatigue and less stamina since treatments. No other complaints are noted.   ALLERGIES:  is allergic to latex.  Meds: Current Outpatient Prescriptions  Medication Sig Dispense Refill  . dexamethasone (DECADRON) 4 MG tablet Take 0.5 tablets (2 mg total) by mouth daily. Match program 30 tablet 0  . folic acid (FOLVITE) 1 MG tablet Take 1 tablet (1 mg total) by mouth daily. 100 tablet 1  . Multiple Vitamin (MULTIVITAMIN) tablet Take 1 tablet by mouth daily.    . nicotine (NICODERM CQ - DOSED IN MG/24 HOURS) 21 mg/24hr patch Place 1 patch (21 mg total) onto the skin daily. 28 patch 0  . nystatin (MYCOSTATIN) 100000 UNIT/ML suspension Take 5 mLs (500,000 Units total) by mouth 4 (four) times daily. Swish and swallow. 300 mL 0  . oxyCODONE (ROXICODONE) 15 MG immediate release tablet Take  1 tablet (15 mg total) by mouth every 6 (six) hours as needed for severe pain. (Patient taking differently: Take 5 mg by mouth every 6 (six) hours as needed for severe pain. ) 120 tablet 0  . ranitidine (ZANTAC 75) 75 MG tablet Take 1 tablet (75 mg total) by mouth 2 (two) times daily. 30 tablet 0  . thiamine (VITAMIN B-1) 100 MG tablet Take 1 tablet (100 mg total) by mouth daily. 100 tablet 0  . fentaNYL (DURAGESIC - DOSED MCG/HR) 12 MCG/HR APPLY 1 PATCH TO SKIN EVERY 72 HOURS  0  . fluconazole  (DIFLUCAN) 150 MG tablet Take 1 tablet (150 mg total) by mouth daily. (Patient not taking: Reported on 09/24/2016) 3 tablet 0  . ibuprofen (ADVIL,MOTRIN) 200 MG tablet Take 400 mg by mouth every 6 (six) hours as needed for mild pain.    [name] levETIRAcetam (KEPPRA) 500 MG tablet Take 1 tablet (500 mg total) by mouth 2 (two) times daily. 60 tablet 3  . ondansetron (ZOFRAN) 8 MG tablet Take 1 tablet (8 mg total) by mouth every 8 (eight) hours as needed for nausea or vomiting. (Patient not taking: Reported on 09/24/2016) 30 tablet 0  . pantoprazole (PROTONIX) 40 MG tablet Take 1 tablet (40 mg total) by mouth 2 (two) times daily. Match program 60 tablet 2  . sucralfate (CARAFATE) 1 GM/10ML suspension Take 10 mLs (1 g total) by mouth 4 (four) times daily -  with meals and at bedtime. Match program (Patient not taking: Reported on 09/24/2016) 420 mL 3   No current facility-administered medications for this encounter.     Physical Findings:  height is 5\' 10"  (1.778 m) and weight is 162 lb (73.5 kg). His oral temperature is 98.6 F (37 C). His blood pressure is 140/94 (abnormal) and his pulse is 88. His respiration is 18 and oxygen saturation is 96%.  Pain Assessment Pain Score: 0-No pain/10 In general this is a well appearing African American male with stigmata of steroid use with moon facies. He is in no acute distress. He's alert and oriented x4 and appropriate throughout the examination. Cardiopulmonary assessment is negative for acute distress and he exhibits normal effort. No focal neurologic defects are noted grossly.  Lab Findings: Lab Results  Component Value Date   WBC 8.2 07/12/2016   HGB 11.8 (L) 07/12/2016   HCT 33.8 (L) 07/12/2016   MCV 86.2 07/12/2016   PLT 166 07/12/2016     Radiographic Findings: No results found.  Impression/Plan: 1. 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 The patient is doing ok since his last visit. He's not being  followed by neurology at this point, and I've encouraged him to continue his Keppra and Dexamethasone. We will try to slowly re-taper to 2 mg every other day x 2 weeks, then stop if he's able. However if he is intolerant to this taper, we could consider 1 mg daily then 1 mg QOD at 1-2 week intervals. I've discussed referral to Dr. Mickeal Skinner as well and we will coordinate this. He will continue PPI for prophylaxis. This and his steroid were refilled today. We will proceed with his first brain MRI in about 1.5 months. He is in agreement with this plan. He will follow up with Dr. Alen Blew in August for repeat PET scan as well. 2. Seizures. Per #1, he will meet with Dr. Mickeal Skinner and be followed as well by him with guidance for antiepileptic medication. 3. Opoid induced constipation. The patient  was counseled on the role of miralax. He will finish the dulcolax he has prior to making this change.  4. Low back pain. 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 Maryjean Ka in neurosurgery pain medicine clinic for this. He is in agreement with this plan.    Carola Rhine, PAC

## 2016-09-25 NOTE — Progress Notes (Signed)
Patient ID: Eric Mason, male    DOB: 04/27/1970  MRN: [id number]  CC: Follow-up   Subjective: Eric Mason is a 46 y.o. male who presents for f/u visit His concerns today include:  Pt with hx of CUP in brain possible germ cell tumor vs met CA to brain, polysubst abuse, thrush on oral steroid.  1. Brain tumor: completed XRT -spot found on spine recently per pt - Now seeing a pain specialist  -had sz about 3 wks ago. Still on Keppra  2.c/o anterior chest pain lower sternal border  x 1- 1.5 mth -sharp in character and does not radiate.  -Occurs after he eats and tries to take a deep breath AND when walking and tries to take a deep breath. Worse with leaning forward. No fever. -"feels like something comes up from stomach and swells right there in my chest." Does not think it is heartburn. On Protonix but out for past 4 days -endorses feeling of solid foods getting stuck in lower chest over spot of pain -abdomen feels tight and bloated since wgh gain from steroid and constipation from narcotics -EGD done 07/09/2016 for odynophagia revealed moderately severe esophagitis and path negative for malignancy and microorganisms. -has had thrush intermittently since being on Decadron. Did 8 day course of Fluconazole in April while in hospital. On Nystatin swish and swallow since then. Ritta Slot returns and symptom of foods getting stuck in chest return whenever he runs out of Nystatin  -+SOB and easy fatigue on minimal exertion since cancer diagnosis and treatment. Use to be able to walk up 1 flight of stairs to his apartment without difficulty but now is a chore.  -smoking 3-4 cig a day. Using patches.  -no fhx of CAD. -no LE edema or recent long distance travel  Placed on Neurontin (does not recall dose) for pain/numbness in legs by pain specialist post EMG On Elavil for depression  Patient Active Problem List   Diagnosis Date Noted  . Acute esophagitis   . Intractable nausea  and vomiting 07/04/2016  . Odynophagia 07/04/2016  . Tobacco abuse 07/04/2016  . Dehydration   . Brain metastasis ([place]) 07/02/2016  . Pulmonary nodule 06/19/2016  . Seizure ([place]) 06/17/2016  . Hx of pneumothorax 05/15/2016     Current Outpatient Prescriptions on File Prior to Visit  Medication Sig Dispense Refill  . dexamethasone (DECADRON) 4 MG tablet Take 0.5 tablets (2 mg total) by mouth daily. Match program 30 tablet 0  . fentaNYL (DURAGESIC - DOSED MCG/HR) 12 MCG/HR APPLY 1 PATCH TO SKIN EVERY 72 HOURS  0  . fluconazole (DIFLUCAN) 150 MG tablet Take 1 tablet (150 mg total) by mouth daily. (Patient not taking: Reported on 09/24/2016) 3 tablet 0  . folic acid (FOLVITE) 1 MG tablet Take 1 tablet (1 mg total) by mouth daily. 100 tablet 1  . ibuprofen (ADVIL,MOTRIN) 200 MG tablet Take 400 mg by mouth every 6 (six) hours as needed for mild pain.    [name] levETIRAcetam (KEPPRA) 500 MG tablet Take 1 tablet (500 mg total) by mouth 2 (two) times daily. 60 tablet 3  . Multiple Vitamin (MULTIVITAMIN) tablet Take 1 tablet by mouth daily.    . nicotine (NICODERM CQ - DOSED IN MG/24 HOURS) 21 mg/24hr patch Place 1 patch (21 mg total) onto the skin daily. 28 patch 0  . nystatin (MYCOSTATIN) 100000 UNIT/ML suspension Take 5 mLs (500,000 Units total) by mouth 4 (four) times daily. Swish and swallow. 300 mL 0  .  ondansetron (ZOFRAN) 8 MG tablet Take 1 tablet (8 mg total) by mouth every 8 (eight) hours as needed for nausea or vomiting. (Patient not taking: Reported on 09/24/2016) 30 tablet 0  . oxyCODONE (ROXICODONE) 15 MG immediate release tablet Take 1 tablet (15 mg total) by mouth every 6 (six) hours as needed for severe pain. (Patient taking differently: Take 5 mg by mouth every 6 (six) hours as needed for severe pain. ) 120 tablet 0  . pantoprazole (PROTONIX) 40 MG tablet Take 1 tablet (40 mg total) by mouth 2 (two) times daily. Match program 60 tablet 2  . ranitidine (ZANTAC 75) 75 MG tablet Take 1 tablet  (75 mg total) by mouth 2 (two) times daily. 30 tablet 0  . sucralfate (CARAFATE) 1 GM/10ML suspension Take 10 mLs (1 g total) by mouth 4 (four) times daily -  with meals and at bedtime. Match program (Patient not taking: Reported on 09/24/2016) 420 mL 3  . thiamine (VITAMIN B-1) 100 MG tablet Take 1 tablet (100 mg total) by mouth daily. 100 tablet 0   No current facility-administered medications on file prior to visit.     Allergies  Allergen Reactions  . Latex     UNSPECIFIED REACTION     Social History   Social History  . Marital status: Married    Spouse name: N/A  . Number of children: N/A  . Years of education: N/A   Occupational History  . Not on file.   Social History Main Topics  . Smoking status: Current Every Day Smoker  . Smokeless tobacco: Never Used     Comment: attempting to stop  . Alcohol use No     Comment: 2-3 cans of beer daily  . Drug use: No  . Sexual activity: Not on file   Other Topics Concern  . Not on file   Social History Narrative  . No narrative on file    Family History  Problem Relation Age of Onset  . Hypertension Mother   . Thyroid cancer Mother   . Hypertension Father     Past Surgical History:  Procedure Laterality Date  . APPLICATION OF CRANIAL NAVIGATION N/A 07/11/2016   Procedure: APPLICATION OF CRANIAL NAVIGATION;  Surgeon: Kevan Ny Ditty, MD;  Location: [place];  Service: Neurosurgery;  Laterality: N/A;  . CRANIOTOMY N/A 07/11/2016   Procedure: Right Temporal craniotomy with brainlab;  Surgeon: Kevan Ny Ditty, MD;  Location: [place];  Service: Neurosurgery;  Laterality: N/A;  Right Temporal   . ESOPHAGOGASTRODUODENOSCOPY N/A 07/09/2016   Procedure: ESOPHAGOGASTRODUODENOSCOPY (EGD);  Surgeon: Jerene Bears, MD;  Location: [facility] ENDOSCOPY;  Service: Endoscopy;  Laterality: N/A;  . SHOULDER SURGERY Right   . VIDEO BRONCHOSCOPY WITH ENDOBRONCHIAL NAVIGATION N/A 06/20/2016   Procedure: VIDEO BRONCHOSCOPY WITH ENDOBRONCHIAL  NAVIGATION;  Surgeon: Collene Gobble, MD;  Location: [place];  Service: Thoracic;  Laterality: N/A;    ROS: Review of Systems As above otherwise negative  PHYSICAL EXAM: BP 137/86   Pulse 95   Temp 98.2 F (36.8 C) (Oral)   Resp 16   Wt 164 lb (74.4 kg)   SpO2 96%   BMI 23.53 kg/m   Pox 97% with ambulatio Physical Exam  General appearance - alert, well appearing, and in no distress Mental status - alert, oriented to person, place, and time, normal mood, behavior, speech, dress, motor activity, and thought processes Mouth -moist. No thrush noted at this time Chest - fine crackles heard RT lower base  Heart - normal rate, regular rhythm, normal S1, S2, no murmurs, rubs, clicks or gallops. No JVD. No reproducible tenderness on palpation of anterior chest Abdomen -mild distension, firm, normal bowel sounds. nontender Extremities - no LE edema  EKD: NSR, no acute ischemic changes  ASSESSMENT AND PLAN: 1. Chest pain, unspecified type -DDx include GI in nature (fungal esophagitis given recurrent thrush + constipation vs esophageal stricture) vs cardiac (pericarditis, CAD). Sounds more GI -stop Ibuprofen -continue Protonix. RF Carafate.  -trail of empiric oral Fluconazole if LFTs ok - EKG 12-Lead - CBC - Comprehensive metabolic panel - Pro b natriuretic peptide - DG Chest 2 View; Future  2. Drug-induced constipation - senna-docusate (SENNA S) 8.6-50 MG tablet; Take 1 tablet by mouth daily.  Dispense: 30 tablet; Refill: 3 - DG Abd 2 Views; Future  3. Esophageal dysphagia -see #1 above - Ambulatory referral to Gastroenterology  4. History of thrush -see #1 above - Ambulatory referral to Gastroenterology  5. Dyspnea on exertion DDX include cardiac cause vs pulmonary vs anemia - Pro b natriuretic peptide - D-dimer, quantitative (not at [facility]) - DG Chest 2 View; Future -CBC  6. Pt to take Ca+Vit D supplement given prolong steroid use.  Patient was given the opportunity  to ask questions.  Patient verbalized understanding of the plan and was able to repeat key elements of the plan.   No orders of the defined types were placed in this encounter.    Requested Prescriptions    No prescriptions requested or ordered in this encounter    No Follow-up on file.  Karle Plumber, MD, FACP

## 2016-09-25 NOTE — Patient Instructions (Signed)
Take the Senna-S for constipation. You have been referred to gastroenterology for the problems swallowing and recurrent thrush.  Please go to [facility] hospital to have x-rays done.

## 2016-09-26 ENCOUNTER — Ambulatory Visit (HOSPITAL_COMMUNITY)
Admission: RE | Admit: 2016-09-26 | Discharge: 2016-09-26 | Disposition: A | Discharge status: Home / Self Care | Payer: Medicaid Other | Source: Ambulatory Visit | Attending: Internal Medicine | Admitting: Internal Medicine

## 2016-09-26 DIAGNOSIS — T50905A Adverse effect of unspecified drugs, medicaments and biological substances, initial encounter: Secondary | ICD-10-CM | POA: Diagnosis not present

## 2016-09-26 DIAGNOSIS — J9811 Atelectasis: Secondary | ICD-10-CM | POA: Insufficient documentation

## 2016-09-26 DIAGNOSIS — K5903 Drug induced constipation: Secondary | ICD-10-CM

## 2016-09-26 DIAGNOSIS — R0609 Other forms of dyspnea: Secondary | ICD-10-CM

## 2016-09-26 DIAGNOSIS — R079 Chest pain, unspecified: Secondary | ICD-10-CM | POA: Diagnosis present

## 2016-09-26 LAB — COMPREHENSIVE METABOLIC PANEL
ALK PHOS: 89 IU/L (ref 39–117)
ALT: 26 IU/L (ref 0–44)
AST: 16 IU/L (ref 0–40)
Albumin/Globulin Ratio: 1.3 (ref 1.2–2.2)
Albumin: 3.9 g/dL (ref 3.5–5.5)
BUN/Creatinine Ratio: 13 (ref 9–20)
BUN: 8 mg/dL (ref 6–24)
Bilirubin Total: <0.2 mg/dL (ref 0.0–1.2)
CO2: 20 mmol/L (ref 20–29)
CREATININE: 0.63 mg/dL — AB (ref 0.76–1.27)
Calcium: 9.1 mg/dL (ref 8.7–10.2)
Chloride: 103 mmol/L (ref 96–106)
GFR calc Af Amer: 137 mL/min/{1.73_m2} (ref >59)
GFR calc non Af Amer: 118 mL/min/{1.73_m2} (ref >59)
GLUCOSE: 114 mg/dL — AB (ref 65–99)
Globulin, Total: 3 g/dL (ref 1.5–4.5)
Potassium: 3.8 mmol/L (ref 3.5–5.2)
Sodium: 144 mmol/L (ref 134–144)
Total Protein: 6.9 g/dL (ref 6.0–8.5)

## 2016-09-26 LAB — CBC
HEMOGLOBIN: 10.1 g/dL — AB (ref 13.0–17.7)
Hematocrit: 30.9 % — ABNORMAL LOW (ref 37.5–51.0)
MCH: 29.1 pg (ref 26.6–33.0)
MCHC: 32.7 g/dL (ref 31.5–35.7)
MCV: 89 fL (ref 79–97)
Platelets: 345 10*3/uL (ref 150–379)
RBC: 3.47 x10E6/uL — ABNORMAL LOW (ref 4.14–5.80)
RDW: 18.8 % — AB (ref 12.3–15.4)
WBC: 12.3 10*3/uL — AB (ref 3.4–10.8)

## 2016-09-26 LAB — D-DIMER, QUANTITATIVE: D-DIMER: 0.78 mg/L FEU — ABNORMAL HIGH (ref 0.00–0.49)

## 2016-09-26 NOTE — Addendum Note (Signed)
Addended by: Karle Plumber B on: 09/26/2016 02:55 PM   Modules accepted: Orders

## 2016-09-26 NOTE — Progress Notes (Signed)
CBC noted. Will add iron studies, Vit B12 , Folate and A1C to labs.

## 2016-09-27 ENCOUNTER — Telehealth: Payer: Self-pay | Admitting: Internal Medicine

## 2016-09-27 DIAGNOSIS — R079 Chest pain, unspecified: Secondary | ICD-10-CM

## 2016-09-27 MED ORDER — ALBUTEROL SULFATE HFA 108 (90 BASE) MCG/ACT IN AERS: 2.0000 | INHALATION_SPRAY | Freq: Four times a day (QID) | RESPIRATORY_TRACT | 2 refills | Status: DC | PRN

## 2016-09-27 MED ORDER — FLUCONAZOLE 200 MG PO TABS: 200.0000 mg | ORAL_TABLET | Freq: Every day | ORAL | 0 refills | Status: DC

## 2016-09-27 NOTE — Telephone Encounter (Signed)
PC placed to pt this evening. Pt informed that CXR revealed some atelectasis in RT lower lungs over area where I heard fine crackles. Previous CXR revealed emphysematous changes.  CBC with mild elevation WBC which could be due to chronic steroid use.  Worsening anemia.Diff and iron studies, Vit B12/folate added.   Pro-BNP still pending.  D-dimer was mildly elevated, symptoms and Well's criteria in low prob range.  Plan:   1.  Add Albuterol to use PRN dyspnea 2.  Add Carafate with meals for next 2 wks.  His appt with GI is 11/2016 3.  Hold Nystatin for now.  Will give Fluconazole x 10-14 days. Baseline LFTs ok. Pt advised that med can sometimes cause drug induce hepatitis. Stop med if any RT side abdominal pain, or yellowing of skin 4. Await results of pro-BNP. Will make decision at time about need for further study like echo and cardio referal I have called [facility] and lab tech reports their system has not been fully functioning over past several days causing delay in some lab results but should have results by noon tomorrow.

## 2016-09-29 LAB — WHITE BLOOD COUNT AND DIFFERENTIAL
Basophils Absolute: 0 10*3/uL (ref 0.0–0.2)
Basos: 0 %
EOS (ABSOLUTE): 0.1 10*3/uL (ref 0.0–0.4)
Eos: 1 %
IMMATURE GRANULOCYTES: 3 %
Immature Grans (Abs): 0.3 10*3/uL — ABNORMAL HIGH (ref 0.0–0.1)
Lymphocytes Absolute: 3.1 10*3/uL (ref 0.7–3.1)
Lymphs: 24 %
MONOCYTES: 5 %
MONOS ABS: 0.6 10*3/uL (ref 0.1–0.9)
Neutrophils Absolute: 8.8 10*3/uL — ABNORMAL HIGH (ref 1.4–7.0)
Neutrophils: 67 %
WBC: 13 10*3/uL — ABNORMAL HIGH (ref 3.4–10.8)

## 2016-09-29 LAB — SPECIMEN STATUS REPORT

## 2016-09-30 ENCOUNTER — Telehealth: Payer: Self-pay | Admitting: Internal Medicine

## 2016-09-30 DIAGNOSIS — R0781 Pleurodynia: Secondary | ICD-10-CM

## 2016-09-30 DIAGNOSIS — R0602 Shortness of breath: Secondary | ICD-10-CM

## 2016-09-30 NOTE — Telephone Encounter (Signed)
PC placed to pt this evening. Left message that I was trying to reach him. I received the rest of his labs and also wanted to touch base to see if CP/SOB any better with measures taken so far. Will try him again later. proBNP was 113 per verbal from [employer]. 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 Has ACD based on iron studies.

## 2016-10-01 MED FILL — GABAPENTIN 100 MG CAP: 100 | 30 days supply | Qty: 90 | Fill #0

## 2016-10-01 MED FILL — AMITRIPTYLINE HCL 25 MG TAB: 25 | 30 days supply | Qty: 30 | Fill #0

## 2016-10-01 NOTE — Telephone Encounter (Signed)
Contacted the radiology department and schedule the CT and is schedule for October 02, 2016 @4pm . Pt is to arive at 345pm at [facility]. Pt is to NPO after 12am. Pt is to only have clear liquids 4 hour prior. Pt is aware of appointment and doesn't have any questions or concerns

## 2016-10-01 NOTE — Telephone Encounter (Signed)
PC placed to pt this a.m. Pt informed of pre-DM likely caused by steroid.  Pt currently being weaned off. Down to 2 mg Decadron QOD. Dietary counseling given. Pt also informed of ACD. Informed of neg BNP but + d-dimer He reports his breathing is better with the inhaler but still having the chest pains despite the Fluconazole and Carafate. Therefore will move forward with getting a CT angio to r/o P.E and referral to cardiology.  GI app scheduled for 11/2016.  Pt has appt with pain specialist today at 2 p.m.

## 2016-10-02 ENCOUNTER — Ambulatory Visit (HOSPITAL_COMMUNITY)
Admission: RE | Admit: 2016-10-02 | Discharge: 2016-10-02 | Disposition: A | Discharge status: Home / Self Care | Payer: Medicaid Other | Source: Ambulatory Visit | Attending: Internal Medicine | Admitting: Internal Medicine

## 2016-10-02 DIAGNOSIS — R0781 Pleurodynia: Secondary | ICD-10-CM | POA: Insufficient documentation

## 2016-10-02 DIAGNOSIS — R918 Other nonspecific abnormal finding of lung field: Secondary | ICD-10-CM | POA: Diagnosis not present

## 2016-10-02 DIAGNOSIS — I700 Atherosclerosis of aorta: Secondary | ICD-10-CM | POA: Insufficient documentation

## 2016-10-02 DIAGNOSIS — I2510 Atherosclerotic heart disease of native coronary artery without angina pectoris: Secondary | ICD-10-CM | POA: Insufficient documentation

## 2016-10-02 DIAGNOSIS — J439 Emphysema, unspecified: Secondary | ICD-10-CM | POA: Insufficient documentation

## 2016-10-02 DIAGNOSIS — R0602 Shortness of breath: Secondary | ICD-10-CM | POA: Diagnosis not present

## 2016-10-02 MED ORDER — IOPAMIDOL (ISOVUE-370) INJECTION 76%
100.0000 mL | Freq: Once | INTRAVENOUS | Status: AC | PRN
  Administered 2016-10-02: 100 mL via INTRAVENOUS

## 2016-10-02 MED ORDER — IOPAMIDOL (ISOVUE-370) INJECTION 76%
INTRAVENOUS | Status: AC
  Filled 2016-10-02: qty 100

## 2016-10-03 ENCOUNTER — Telehealth: Payer: Self-pay | Admitting: Internal Medicine

## 2016-10-03 LAB — PRO B NATRIURETIC PEPTIDE: NT-PRO BNP: 113 pg/mL (ref 0–121)

## 2016-10-03 MED FILL — fentaNYL 12 MCG/HR PT72: 12 | 30 days supply | Qty: 10 | Fill #0

## 2016-10-03 MED FILL — oxyCODONE HCL 5 MG TABS: 5 | 30 days supply | Qty: 50 | Fill #0

## 2016-10-03 NOTE — Telephone Encounter (Signed)
PC placed to pt. Pt informed that CT angio negative for P.E.  Did show new nodule 7 mm in RT upper lobe.  Pt has PET scan scheduled by his oncologist for next mth which would shed light on whether this may be a cancerous spot. +cardiac Ca+ and emphysematous changes in lung.  Has appt with cardiology later this mth.

## 2016-10-04 ENCOUNTER — Other Ambulatory Visit: Payer: Self-pay | Admitting: Family Medicine

## 2016-10-08 ENCOUNTER — Ambulatory Visit: Payer: Medicaid Other | Admitting: Cardiology

## 2016-10-10 ENCOUNTER — Ambulatory Visit (INDEPENDENT_AMBULATORY_CARE_PROVIDER_SITE_OTHER): Payer: Medicaid Other | Admitting: Cardiology

## 2016-10-10 ENCOUNTER — Encounter: Payer: Self-pay | Admitting: Cardiology

## 2016-10-10 ENCOUNTER — Ambulatory Visit: Payer: Medicaid Other | Admitting: Cardiology

## 2016-10-10 VITALS — BP 120/80 | HR 76 | Resp 16 | Ht 70.0 in | Wt 160.1 lb

## 2016-10-10 DIAGNOSIS — C7931 Secondary malignant neoplasm of brain: Secondary | ICD-10-CM | POA: Diagnosis not present

## 2016-10-10 DIAGNOSIS — K2100 Gastro-esophageal reflux disease with esophagitis, without bleeding: Secondary | ICD-10-CM

## 2016-10-10 DIAGNOSIS — R0789 Other chest pain: Secondary | ICD-10-CM

## 2016-10-10 DIAGNOSIS — Z720 Tobacco use: Secondary | ICD-10-CM | POA: Diagnosis not present

## 2016-10-10 DIAGNOSIS — K219 Gastro-esophageal reflux disease without esophagitis: Secondary | ICD-10-CM | POA: Insufficient documentation
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
## 2016-10-10 NOTE — Patient Instructions (Addendum)
Medication Instructions:  Your physician recommends that you continue on your current medications as directed. Please refer to the Current Medication list given to you today. Labwork: None   Testing/Procedures: Your physician has requested that you have en exercise stress myoview. For further information please visit [email or web address]. Please follow instruction sheet, as given.  Your physician has requested that you have an echocardiogram. Echocardiography is a painless test that uses sound waves to create images of your heart. It provides your doctor with information about the size and shape of your heart and how well your heart's chambers and valves are working. This procedure takes approximately one hour. There are no restrictions for this procedure.     Follow-Up: Your physician recommends that you schedule a follow-up appointment in: 3-4 weeks with Dr. Agustin Cree.   Any Other Special Instructions Will Be Listed Below (If Applicable).     If you need a refill on your cardiac medications before your next appointment, please call your pharmacy.

## 2016-10-11 LAB — SPECIMEN STATUS REPORT

## 2016-10-15 ENCOUNTER — Other Ambulatory Visit: Payer: Self-pay | Admitting: Neurological Surgery

## 2016-10-15 DIAGNOSIS — C7931 Secondary malignant neoplasm of brain: Secondary | ICD-10-CM

## 2016-10-15 LAB — HEMOGLOBIN A1C
ESTIMATED AVERAGE GLUCOSE: 128 mg/dL
HEMOGLOBIN A1C: 6.1 % — AB (ref 4.8–5.6)

## 2016-10-15 LAB — FOLATE: Folate: >20 ng/mL (ref >3.0)

## 2016-10-15 LAB — IRON,TIBC AND FERRITIN PANEL
Ferritin: 261 ng/mL (ref 30–400)
IRON: 57 ug/dL (ref 38–169)
Iron Saturation: 21 % (ref 15–55)
Total Iron Binding Capacity: 274 ug/dL (ref 250–450)
UIBC: 217 ug/dL (ref 111–343)

## 2016-10-15 LAB — VITAMIN B12: Vitamin B-12: >2000 pg/mL — ABNORMAL HIGH (ref 232–1245)

## 2016-10-15 LAB — SPECIMEN STATUS REPORT

## 2016-10-22 ENCOUNTER — Encounter: Payer: Self-pay | Admitting: *Deleted

## 2016-10-22 ENCOUNTER — Telehealth (HOSPITAL_COMMUNITY): Payer: Self-pay | Admitting: *Deleted

## 2016-10-22 NOTE — Telephone Encounter (Signed)
Patient given detailed instructions per Myocardial Perfusion Study Information Sheet for the test on 10/24/16. Patient notified to arrive 15 minutes early and that it is imperative to arrive on time for appointment to keep from having the test rescheduled.  If you need to cancel or reschedule your appointment, please call the office within 24 hours of your appointment. . Patient verbalized understanding. Eric Mason

## 2016-10-23 ENCOUNTER — Telehealth: Payer: Self-pay

## 2016-10-23 NOTE — Telephone Encounter (Signed)
Spoke with patient and he is aware of his new appt time on 8/22   Eric Mason

## 2016-10-24 ENCOUNTER — Other Ambulatory Visit (HOSPITAL_COMMUNITY): Payer: Medicaid Other

## 2016-10-24 ENCOUNTER — Encounter (HOSPITAL_COMMUNITY): Payer: Medicaid Other

## 2016-10-26 ENCOUNTER — Other Ambulatory Visit: Payer: Self-pay | Admitting: Radiation Therapy

## 2016-10-26 DIAGNOSIS — C7931 Secondary malignant neoplasm of brain: Secondary | ICD-10-CM

## 2016-10-29 ENCOUNTER — Other Ambulatory Visit: Payer: Self-pay

## 2016-10-30 ENCOUNTER — Other Ambulatory Visit: Payer: Self-pay

## 2016-10-30 ENCOUNTER — Other Ambulatory Visit (HOSPITAL_BASED_OUTPATIENT_CLINIC_OR_DEPARTMENT_OTHER): Payer: Medicaid Other

## 2016-10-30 ENCOUNTER — Encounter (HOSPITAL_COMMUNITY): Payer: Medicaid Other

## 2016-10-30 DIAGNOSIS — R911 Solitary pulmonary nodule: Secondary | ICD-10-CM

## 2016-10-30 DIAGNOSIS — C801 Malignant (primary) neoplasm, unspecified: Secondary | ICD-10-CM | POA: Diagnosis not present

## 2016-10-30 DIAGNOSIS — C7931 Secondary malignant neoplasm of brain: Secondary | ICD-10-CM | POA: Diagnosis not present

## 2016-10-30 LAB — CBC WITH DIFFERENTIAL/PLATELET
BASO%: 0.6 % (ref 0.0–2.0)
Basophils Absolute: 0 10*3/uL (ref 0.0–0.1)
EOS ABS: 0.1 10*3/uL (ref 0.0–0.5)
EOS%: 0.8 % (ref 0.0–7.0)
HCT: 33.8 % — ABNORMAL LOW (ref 38.4–49.9)
HGB: 11.1 g/dL — ABNORMAL LOW (ref 13.0–17.1)
LYMPH%: 33.1 % (ref 14.0–49.0)
MCH: 30.1 pg (ref 27.2–33.4)
MCHC: 32.9 g/dL (ref 32.0–36.0)
MCV: 91.5 fL (ref 79.3–98.0)
MONO#: 1.1 10*3/uL — ABNORMAL HIGH (ref 0.1–0.9)
MONO%: 14.2 % — AB (ref 0.0–14.0)
NEUT%: 51.3 % (ref 39.0–75.0)
NEUTROS ABS: 3.9 10*3/uL (ref 1.5–6.5)
Platelets: 339 10*3/uL (ref 140–400)
RBC: 3.7 10*6/uL — AB (ref 4.20–5.82)
RDW: 17.4 % — ABNORMAL HIGH (ref 11.0–14.6)
WBC: 7.6 10*3/uL (ref 4.0–10.3)
lymph#: 2.5 10*3/uL (ref 0.9–3.3)

## 2016-10-30 LAB — COMPREHENSIVE METABOLIC PANEL
ALT: 25 U/L (ref 0–55)
AST: 35 U/L — ABNORMAL HIGH (ref 5–34)
Albumin: 2.8 g/dL — ABNORMAL LOW (ref 3.5–5.0)
Alkaline Phosphatase: 93 U/L (ref 40–150)
Anion Gap: 8 mEq/L (ref 3–11)
BILIRUBIN TOTAL: 0.41 mg/dL (ref 0.20–1.20)
BUN: 4.8 mg/dL — ABNORMAL LOW (ref 7.0–26.0)
CHLORIDE: 101 meq/L (ref 98–109)
CO2: 27 meq/L (ref 22–29)
Calcium: 9.3 mg/dL (ref 8.4–10.4)
Creatinine: 0.9 mg/dL (ref 0.7–1.3)
GLUCOSE: 108 mg/dL (ref 70–140)
Potassium: 3.3 mEq/L — ABNORMAL LOW (ref 3.5–5.1)
SODIUM: 137 meq/L (ref 136–145)
TOTAL PROTEIN: 6.8 g/dL (ref 6.4–8.3)

## 2016-10-31 ENCOUNTER — Other Ambulatory Visit: Payer: Self-pay | Admitting: Oncology

## 2016-10-31 ENCOUNTER — Ambulatory Visit: Payer: Self-pay | Admitting: Oncology

## 2016-10-31 ENCOUNTER — Other Ambulatory Visit: Payer: Self-pay | Admitting: *Deleted

## 2016-10-31 ENCOUNTER — Telehealth (HOSPITAL_COMMUNITY): Payer: Self-pay | Admitting: *Deleted

## 2016-10-31 ENCOUNTER — Telehealth: Payer: Self-pay

## 2016-10-31 DIAGNOSIS — R911 Solitary pulmonary nodule: Secondary | ICD-10-CM

## 2016-10-31 DIAGNOSIS — C801 Malignant (primary) neoplasm, unspecified: Secondary | ICD-10-CM

## 2016-10-31 NOTE — Telephone Encounter (Signed)
Attempted to call patient regarding upcoming appointment- no answer.  Eric Mason  

## 2016-10-31 NOTE — Telephone Encounter (Signed)
Spoke with wife concerning appointment for 9/4 @ 12 noon. Per los

## 2016-11-01 ENCOUNTER — Other Ambulatory Visit: Payer: Medicaid Other

## 2016-11-01 ENCOUNTER — Ambulatory Visit (HOSPITAL_COMMUNITY): Payer: Medicaid Other | Attending: Internal Medicine

## 2016-11-01 ENCOUNTER — Other Ambulatory Visit: Payer: Self-pay

## 2016-11-01 ENCOUNTER — Ambulatory Visit (HOSPITAL_BASED_OUTPATIENT_CLINIC_OR_DEPARTMENT_OTHER): Payer: Medicaid Other

## 2016-11-01 DIAGNOSIS — R0789 Other chest pain: Secondary | ICD-10-CM | POA: Diagnosis present

## 2016-11-01 DIAGNOSIS — Z720 Tobacco use: Secondary | ICD-10-CM | POA: Insufficient documentation

## 2016-11-01 MED ORDER — TECHNETIUM TC 99M TETROFOSMIN IV KIT
10.5000 | PACK | Freq: Once | INTRAVENOUS | Status: AC | PRN
  Administered 2016-11-01: 10.5 via INTRAVENOUS
  Filled 2016-11-01: qty 11

## 2016-11-05 ENCOUNTER — Ambulatory Visit (HOSPITAL_COMMUNITY): Payer: Medicaid Other | Attending: Cardiology

## 2016-11-05 DIAGNOSIS — R0789 Other chest pain: Secondary | ICD-10-CM | POA: Diagnosis not present

## 2016-11-05 MED ORDER — REGADENOSON 0.4 MG/5ML IV SOLN
0.4000 mg | Freq: Once | INTRAVENOUS | Status: AC
  Administered 2016-11-05: 0.4 mg via INTRAVENOUS

## 2016-11-05 MED ORDER — TECHNETIUM TC 99M TETROFOSMIN IV KIT
33.0000 | PACK | Freq: Once | INTRAVENOUS | Status: AC | PRN
  Administered 2016-11-05: 33 via INTRAVENOUS
  Filled 2016-11-05: qty 33

## 2016-11-06 LAB — MYOCARDIAL PERFUSION IMAGING
CHL CUP NUCLEAR SDS: 1
CHL CUP NUCLEAR SRS: 5
CHL CUP NUCLEAR SSS: 2
CHL CUP RESTING HR STRESS: 88 {beats}/min
CSEPPHR: 102 {beats}/min
LV sys vol: 56 mL
LVDIAVOL: 131 mL (ref 62–150)
NUC STRESS TID: 1.06
RATE: 0.37

## 2016-11-07 ENCOUNTER — Ambulatory Visit (HOSPITAL_COMMUNITY): Admission: RE | Admit: 2016-11-07 | Payer: Medicaid Other | Source: Ambulatory Visit

## 2016-11-07 ENCOUNTER — Ambulatory Visit: Payer: Medicaid Other | Admitting: Cardiology

## 2016-11-07 ENCOUNTER — Encounter (HOSPITAL_COMMUNITY): Payer: Self-pay

## 2016-11-08 ENCOUNTER — Inpatient Hospital Stay
Admission: RE | Admit: 2016-11-08 | Discharge: 2016-11-08 | Disposition: A | Discharge status: Home / Self Care | Payer: Self-pay | Source: Ambulatory Visit | Attending: Radiation Oncology | Admitting: Radiation Oncology

## 2016-11-08 ENCOUNTER — Encounter: Payer: Self-pay | Admitting: Cardiology

## 2016-11-08 ENCOUNTER — Ambulatory Visit (INDEPENDENT_AMBULATORY_CARE_PROVIDER_SITE_OTHER): Payer: Medicaid Other | Admitting: Cardiology

## 2016-11-08 VITALS — BP 118/68 | HR 76 | Resp 14 | Ht 70.0 in | Wt 163.0 lb

## 2016-11-08 DIAGNOSIS — M79604 Pain in right leg: Secondary | ICD-10-CM

## 2016-11-08 DIAGNOSIS — M79605 Pain in left leg: Secondary | ICD-10-CM

## 2016-11-08 DIAGNOSIS — Z720 Tobacco use: Secondary | ICD-10-CM | POA: Diagnosis not present

## 2016-11-08 DIAGNOSIS — R0789 Other chest pain: Secondary | ICD-10-CM

## 2016-11-08 DIAGNOSIS — R9439 Abnormal result of other cardiovascular function study: Secondary | ICD-10-CM | POA: Diagnosis not present

## 2016-11-08 DIAGNOSIS — C7931 Secondary malignant neoplasm of brain: Secondary | ICD-10-CM

## 2016-11-08 MED ORDER — RANOLAZINE ER 500 MG PO TB12: 500.0000 mg | ORAL_TABLET | Freq: Two times a day (BID) | ORAL | 11 refills | Status: DC

## 2016-11-08 NOTE — Addendum Note (Signed)
Addended by: Kathyrn Sheriff on: 11/08/2016 11:42 AM   Modules accepted: Orders
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
## 2016-11-08 NOTE — Patient Instructions (Addendum)
Medication Instructions:  Your physician has recommended you make the following change in your medication:  1.) START Ranexa 500 twice daily.   Labwork: None   Testing/Procedures: Your physician has requested that you have a lower or upper extremity venous duplex. This test is an ultrasound of the veins in the legs or arms. It looks at venous blood flow that carries blood from the heart to the legs or arms. Allow one hour for a Lower Venous exam. Allow thirty minutes for an Upper Venous exam. There are no restrictions or special instructions.  [address], [place], [place] [id number] [phone]  Follow-Up: Your physician recommends that you schedule a follow-up appointment in: 1 month   Any Other Special Instructions Will Be Listed Below (If Applicable).  Please note that any paperwork needing to be filled out by the provider will need to be addressed at the front desk prior to seeing the provider. Please note that any paperwork FMLA, Disability or other documents regarding health condition is subject to a $25.00 charge that must be received prior to completion of paperwork in the form of a money order or check.     If you need a refill on your cardiac medications before your next appointment, please call your pharmacy.

## 2016-11-13 ENCOUNTER — Telehealth: Payer: Self-pay | Admitting: Oncology

## 2016-11-13 ENCOUNTER — Ambulatory Visit (HOSPITAL_BASED_OUTPATIENT_CLINIC_OR_DEPARTMENT_OTHER): Payer: Medicaid Other | Admitting: Oncology

## 2016-11-13 ENCOUNTER — Ambulatory Visit
Admission: RE | Admit: 2016-11-13 | Discharge: 2016-11-13 | Disposition: A | Discharge status: Home / Self Care | Payer: Medicaid Other | Source: Ambulatory Visit | Attending: Neurological Surgery | Admitting: Neurological Surgery

## 2016-11-13 DIAGNOSIS — C801 Malignant (primary) neoplasm, unspecified: Secondary | ICD-10-CM | POA: Diagnosis not present

## 2016-11-13 DIAGNOSIS — R918 Other nonspecific abnormal finding of lung field: Secondary | ICD-10-CM

## 2016-11-13 DIAGNOSIS — C7931 Secondary malignant neoplasm of brain: Secondary | ICD-10-CM | POA: Diagnosis not present

## 2016-11-13 DIAGNOSIS — C3490 Malignant neoplasm of unspecified part of unspecified bronchus or lung: Secondary | ICD-10-CM

## 2016-11-13 MED ORDER — GADOBENATE DIMEGLUMINE 529 MG/ML IV SOLN
15.0000 mL | Freq: Once | INTRAVENOUS | Status: AC | PRN
  Administered 2016-11-13: 15 mL via INTRAVENOUS

## 2016-11-13 NOTE — Progress Notes (Signed)
Hematology and Oncology Follow Up Visit  Eric Mason [id number] [birth year] 46 y.o. 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   Prior Therapy: Status post craniotomy and resection in May 2018. This was followed by [facility] completed in June 2018.  Current therapy: Observation and surveillance.  Interim History: Eric Mason presents today for a follow-up visit. Since the last visit, he has been tapered off dexamethasone and feels reasonably fair. His appetite is slightly decreased but does not report any neurological deficits. He does report mild headaches but no falls or syncope. He is able to perform most activities of daily living. He denied any cough, wheezing or hemoptysis. He does not report any dysphagia without odynophagia.  He does not report any headaches, blurry vision, syncope or seizures. He does not report any fevers or chills or sweats. He does not report any cough, wheezing or hemoptysis. He is not reporting nausea, vomiting or abdominal pain. He does not report any frequency urgency or hesitancy. He does not report any skeletal complaints. Remaining review of systems unremarkable.    Medications: I have reviewed the patient's current medications.  Current Outpatient Prescriptions  Medication Sig Dispense Refill  . albuterol (PROVENTIL HFA;VENTOLIN HFA) 108 (90 Base) MCG/ACT inhaler Inhale 2 puffs into the lungs every 6 (six) hours as needed for wheezing or shortness of breath. 1 Inhaler 2  . amitriptyline (ELAVIL) 25 MG tablet Take 25 mg by mouth at bedtime.    . Calcium Carb-Cholecalciferol (CALCIUM 500 +D) 500-400 MG-UNIT TABS 1 tab PO BID 60 tablet 2  . dexamethasone (DECADRON) 4 MG tablet Take 0.5 tablets (2 mg total) by mouth daily. Match program 30  tablet 0  . fentaNYL (DURAGESIC - DOSED MCG/HR) 12 MCG/HR APPLY 1 PATCH TO SKIN EVERY 72 HOURS  0  . folic acid (FOLVITE) 1 MG tablet Take 1 tablet (1 mg total) by mouth daily. 100 tablet 1  . gabapentin (NEURONTIN) 300 MG capsule Take 300 mg by mouth 3 (three) times daily.    [name] levETIRAcetam (KEPPRA) 500 MG tablet Take 1 tablet (500 mg total) by mouth 2 (two) times daily. 60 tablet 3  . Multiple Vitamin (MULTIVITAMIN) tablet Take 1 tablet by mouth daily.    . nicotine (NICODERM CQ - DOSED IN MG/24 HOURS) 21 mg/24hr patch Place 1 patch (21 mg total) onto the skin daily. 28 patch 0  . ondansetron (ZOFRAN) 8 MG tablet Take 1 tablet (8 mg total) by mouth every 8 (eight) hours as needed for nausea or vomiting. 30 tablet 0  . oxyCODONE (ROXICODONE) 15 MG immediate release tablet Take 1 tablet (15 mg total) by mouth every 6 (six) hours as needed for severe pain. (Patient taking differently: Take 5 mg by mouth every 6 (six) hours as needed for severe pain. ) 120 tablet 0  . pantoprazole (PROTONIX) 40 MG tablet Take 1 tablet (40 mg total) by mouth 2 (two) times daily. Match program 60 tablet 2  . ranitidine (ZANTAC 75) 75 MG tablet Take 1 tablet (75 mg total) by mouth 2 (two) times daily. 30 tablet 0  . ranolazine (RANEXA) 500 MG 12 hr tablet Take 1 tablet (500 mg total) by mouth 2 (two) times daily. 60 tablet 11  . senna-docusate (SENNA S) 8.6-50 MG tablet Take 1 tablet by mouth daily. 30 tablet  3  . thiamine (VITAMIN B-1) 100 MG tablet Take 1 tablet (100 mg total) by mouth daily. 100 tablet 0   No current facility-administered medications for this visit.      Allergies:  Allergies  Allergen Reactions  . Latex     UNSPECIFIED REACTION     Past Medical History, Surgical history, Social history, and Family History were reviewed and updated.  Physical Exam: Blood pressure 118/74, pulse 98, temperature 98.7 F (37.1 C), temperature source Oral, resp. rate 18, height 5\' 10"  (1.778 m), weight 164 lb  4.8 oz (74.5 kg), SpO2 95 %. ECOG: 1 General appearance: alert and cooperative Head: Normocephalic, without obvious abnormality Neck: no adenopathy Lymph nodes: Cervical, supraclavicular, and axillary nodes normal. Heart:regular rate and rhythm, S1, S2 normal, no murmur, click, rub or gallop Lung:chest clear, no wheezing, rales, normal symmetric air entry, Heart exam - S1, S2 normal, no murmur, no gallop, rate regular Abdomin: soft, non-tender, without masses or organomegaly EXT:no erythema, induration, or nodules   Lab Results: Lab Results  Component Value Date   WBC 7.6 10/30/2016   HGB 11.1 (L) 10/30/2016   HCT 33.8 (L) 10/30/2016   MCV 91.5 10/30/2016   PLT 339 10/30/2016     Chemistry      Component Value Date/Time   NA 137 10/30/2016 0909   K 3.3 (L) 10/30/2016 0909   CL 103 09/25/2016 1520   CO2 27 10/30/2016 0909   BUN 4.8 (L) 10/30/2016 0909   CREATININE 0.9 10/30/2016 0909      Component Value Date/Time   CALCIUM 9.3 10/30/2016 0909   ALKPHOS 93 10/30/2016 0909   AST 35 (H) 10/30/2016 0909   ALT 25 10/30/2016 0909   BILITOT 0.41 10/30/2016 0909     EXAM: CT ANGIOGRAPHY CHEST WITH CONTRAST  TECHNIQUE: Multidetector CT imaging of the chest was performed using the standard protocol during bolus administration of intravenous contrast. Multiplanar CT image reconstructions and MIPs were obtained to evaluate the vascular anatomy.  CONTRAST:  100 cc Isovue 370  COMPARISON:  09/26/2016  FINDINGS: Cardiovascular: There is calcification of the coronary vessels. Heart size is normal. Pulmonary arteries are well opacified by contrast bolus. No acute emboli identified. There is normal arch anatomy. There is minimal calcification of the thoracic aorta. No aneurysm.  Mediastinum/Nodes: The visualized portion of the thyroid gland has a normal appearance. Previously identified small AP window lymph node measures 0.8 cm, previously 1.1 cm. Small precarinal  lymph node is 0.5 cm. No new adenopathy.  Lungs/Pleura: Previously identified right upper lobe nodule measures 10 mm on image 54 of series 6 in appears slightly smaller. A new right upper lobe solid nodule is 7 mm on image 47 of series 6. A 3 mm nodule is identified in the right middle lobe on image 92 of series 6. Within the left lower lobe, a nodule measures 5 mm on image 87 of series 6 and is partially obscured by surrounding atelectasis. This nodules probably unchanged compared prior study.  Significant paraseptal emphysematous changes are again identified throughout the lungs, with apical predominance. Scarring is identified at the lung bases. No consolidations or pleural effusions.  Upper Abdomen: 2-7 mm nodules are identified adjacent to the spleen and appear stable, possibly indicating splenules but warranting follow-up on future exams.  Musculoskeletal: No suspicious lytic or blastic lesions are identified.  Review of the MIP images confirms the above findings.  IMPRESSION: 1. Technically adequate exam showing no acute pulmonary embolus. 2. AP window lymph  node appears smaller. 3. Right upper lobe and left lower lobe nodule appear unchanged. 4. New right upper lobe nodule measuring 7 mm. 5. Coronary artery disease. 6. Aortic Atherosclerosis (ICD10-I70.0) and Emphysema (ICD10-J43.9).    Impression and Plan:  46 year old gentleman with the following issues:  1. 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 He presented with a solitary temporal mass measuring 12 mm with vasogenic edema after a seizure. 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 Although metastatic carcinoma could not be ruled out.  His most recent CT scan of the chest done in July 2018 showed right upper lobe and lower lobe small nodules to be unchanged with a 7 mm lung  nodule noted. The primary malignancy remains unknown at this time and no indication for systemic therapy at this time. The plan is continue to monitor him closely and treat if he develops clear signs of systemic disease. I suspect this is primary lung neoplasm and further molecular testing is needed on his specimen.  In the interim, we'll repeat his CT scan of the abdomen to follow-up for any other malignancy.  2. The brain metastasis: Repeat MRI is scheduled for the immediate future.  3. Follow-up: Will be in the next 4 weeks to follow his progress.   Zola Button, MD 9/4/201812:46 PM

## 2016-11-13 NOTE — Telephone Encounter (Signed)
Scheduled appt per 9/4 los - Gave patient AVS and calender per los. Central radiology to contact patient with ct scan.

## 2016-11-13 NOTE — Addendum Note (Signed)
Addended by: Randolm Idol on: 11/13/2016 01:07 PM   Modules accepted: Orders

## 2016-11-14 ENCOUNTER — Ambulatory Visit (HOSPITAL_COMMUNITY)
Admission: RE | Admit: 2016-11-14 | Payer: Medicaid Other | Source: Ambulatory Visit | Attending: Cardiology | Admitting: Cardiology

## 2016-11-19 NOTE — Progress Notes (Addendum)
Eric Mason 46 y.o. 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  Blood pressure is elevated not on blood pressure medication 165/106 sitting and 154/107 standing has headache right side of head. PAIN: He  is currently reports having a headache 7/10 and has been having a headache since he stopped the decadron. NEURO:Alert and oriented x 3 with fluent speech,able to complete sentences without difficulty with word finding or organization of sentences. Reports,denies any visual disturbance,blurred vision,double vision,blind spots or peripheral vision changes.   Reports having nausea  and  vomiting with ataxia the past three days and headache.  He went to the ER in [place], [place]. He was told he had a virus on this past Saturday.  He has not had an elevated temperature Fine motor movement-picking up objects with fingers,holding objects, writing, weakness of lower extremities:  Reports weakness of legs for the past three weeks. Aphasia/Slurred speech:Clear speech. SKIN: Warm and dry Imaging:11-13-16 MRI brain Lab:N/A Weight: 19 lb weight loss since 11-13-16 says he as not been able to eat since stopping the Decadron. Wt Readings from Last 3 Encounters:  11/20/16 149 lb (67.6 kg)  11/13/16 164 lb 4.8 oz (74.5 kg)  11/08/16 163 lb (73.9 kg)  BP (!) 165/106   Pulse 99   Temp 98 F (36.7 C) (Oral)   Resp 18   Ht 5\' 10"  (1.778 m)   Wt 149 lb (67.6 kg)   SpO2 100%   BMI 21.38 kg/m Left arm sitting BP (!) 154/107   Pulse 99   Temp 98 F (36.7 C) (Oral)   Resp 18   Ht 5\' 10"  (1.778 m)   Wt 149 lb (67.6 kg)   SpO2 100%   BMI 21.38 kg/m Left arm standing BP (!) 152/90   Pulse 99   Temp 98 F (36.7 C) (Oral)   Resp 18   Ht 5\' 10"  (1.778 m)   Wt 149 lb (67.6 kg)   SpO2 100%   BMI 21.38 kg/m Left arm sitting manual

## 2016-11-20 ENCOUNTER — Ambulatory Visit
Admission: RE | Admit: 2016-11-20 | Discharge: 2016-11-20 | Disposition: A | Discharge status: Home / Self Care | Payer: Medicaid Other | Source: Ambulatory Visit | Attending: Radiation Oncology | Admitting: Radiation Oncology

## 2016-11-20 ENCOUNTER — Encounter: Payer: Self-pay | Admitting: Radiation Oncology

## 2016-11-20 VITALS — BP 152/90 | HR 99 | Temp 98.0°F | Resp 18 | Ht 70.0 in | Wt 149.0 lb

## 2016-11-20 DIAGNOSIS — C7931 Secondary malignant neoplasm of brain: Secondary | ICD-10-CM

## 2016-11-20 DIAGNOSIS — G4452 New daily persistent headache (NDPH): Secondary | ICD-10-CM

## 2016-11-20 DIAGNOSIS — Z09 Encounter for follow-up examination after completed treatment for conditions other than malignant neoplasm: Secondary | ICD-10-CM | POA: Diagnosis not present
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
## 2016-11-21 NOTE — Addendum Note (Signed)
Encounter addended by: Hayden Pedro, PA-C on: 11/21/2016  7:09 AM<BR>    Actions taken: LOS modified, Follow-up modified

## 2016-11-26 ENCOUNTER — Ambulatory Visit: Payer: Self-pay | Admitting: Radiation Oncology

## 2016-11-26 ENCOUNTER — Telehealth: Payer: Self-pay | Admitting: Internal Medicine

## 2016-11-26 NOTE — Telephone Encounter (Signed)
Appt has been scheduled for the pt to see Dr. Mickeal Skinner on 9/24 at 12pm

## 2016-11-27 ENCOUNTER — Encounter: Payer: Self-pay | Admitting: Internal Medicine

## 2016-11-29 ENCOUNTER — Telehealth: Payer: Self-pay | Admitting: Internal Medicine

## 2016-11-29 ENCOUNTER — Ambulatory Visit (HOSPITAL_BASED_OUTPATIENT_CLINIC_OR_DEPARTMENT_OTHER)
Admission: RE | Admit: 2016-11-29 | Discharge: 2016-11-29 | Disposition: A | Discharge status: Home / Self Care | Payer: Medicaid Other | Source: Ambulatory Visit | Attending: Cardiology | Admitting: Cardiology

## 2016-11-29 DIAGNOSIS — M79605 Pain in left leg: Secondary | ICD-10-CM | POA: Insufficient documentation

## 2016-11-29 DIAGNOSIS — M7989 Other specified soft tissue disorders: Secondary | ICD-10-CM | POA: Diagnosis present

## 2016-11-29 DIAGNOSIS — R590 Localized enlarged lymph nodes: Secondary | ICD-10-CM | POA: Insufficient documentation

## 2016-11-29 DIAGNOSIS — R112 Nausea with vomiting, unspecified: Secondary | ICD-10-CM

## 2016-11-29 DIAGNOSIS — M79604 Pain in right leg: Secondary | ICD-10-CM

## 2016-11-29 NOTE — Telephone Encounter (Signed)
Pt called requesting medication refill on levETIRAcetam (KEPPRA) 500 MG tablet Pt states he only has 2 pills left. Pt states he uses [facility]. Please f/up

## 2016-11-30 MED ORDER — LEVETIRACETAM 500 MG PO TABS: 500.0000 mg | ORAL_TABLET | Freq: Two times a day (BID) | ORAL | 1 refills | Status: DC

## 2016-11-30 NOTE — Telephone Encounter (Signed)
RF sent on Keppra. Will send message to Shona Simpson, PA inquiring whether plan is to leave him on Keppra or wean off.

## 2016-12-03 ENCOUNTER — Encounter: Payer: Self-pay | Admitting: *Deleted

## 2016-12-03 ENCOUNTER — Ambulatory Visit: Payer: Medicaid Other | Admitting: Internal Medicine

## 2016-12-03 ENCOUNTER — Other Ambulatory Visit: Payer: Self-pay | Admitting: *Deleted

## 2016-12-04 ENCOUNTER — Ambulatory Visit: Payer: Medicaid Other | Admitting: Internal Medicine

## 2016-12-05 ENCOUNTER — Telehealth: Payer: Self-pay | Admitting: *Deleted

## 2016-12-05 NOTE — Telephone Encounter (Signed)
No show letter mailed to patient. 

## 2016-12-06 ENCOUNTER — Other Ambulatory Visit: Payer: Self-pay | Admitting: Oncology

## 2016-12-06 DIAGNOSIS — C3490 Malignant neoplasm of unspecified part of unspecified bronchus or lung: Secondary | ICD-10-CM

## 2016-12-06 DIAGNOSIS — C7931 Secondary malignant neoplasm of brain: Secondary | ICD-10-CM

## 2016-12-06 MED ORDER — PANTOPRAZOLE SODIUM 40 MG PO TBEC: 40.0000 mg | DELAYED_RELEASE_TABLET | Freq: Two times a day (BID) | ORAL | 0 refills | Status: DC

## 2016-12-06 NOTE — Telephone Encounter (Signed)
Pt. Called requesting a refill on pantoprazole (PROTONIX) 40 MG tablet  Pt. States that he does not have any more medication. Pt. Uses [facility] pharmacy. Please f/u with pt.

## 2016-12-06 NOTE — Telephone Encounter (Signed)
Pantoprazole refilled. 

## 2016-12-10 ENCOUNTER — Ambulatory Visit (INDEPENDENT_AMBULATORY_CARE_PROVIDER_SITE_OTHER): Payer: Medicaid Other | Admitting: Cardiology

## 2016-12-10 ENCOUNTER — Encounter: Payer: Self-pay | Admitting: Cardiology

## 2016-12-10 ENCOUNTER — Ambulatory Visit: Payer: Self-pay | Admitting: Cardiology

## 2016-12-10 ENCOUNTER — Encounter: Payer: Self-pay | Admitting: Internal Medicine

## 2016-12-10 VITALS — BP 110/64 | HR 92 | Resp 12 | Ht 70.0 in | Wt 148.1 lb

## 2016-12-10 DIAGNOSIS — R9439 Abnormal result of other cardiovascular function study: Secondary | ICD-10-CM

## 2016-12-10 DIAGNOSIS — C7931 Secondary malignant neoplasm of brain: Secondary | ICD-10-CM | POA: Diagnosis not present

## 2016-12-10 MED ORDER — NICOTINE 21 MG/24HR TD PT24
21.0000 mg | MEDICATED_PATCH | Freq: Every day | TRANSDERMAL | 3 refills | Status: DC
Start: 2016-12-10 — End: 2016-12-10

## 2016-12-10 MED ORDER — RANOLAZINE ER 500 MG PO TB12: 500.0000 mg | ORAL_TABLET | Freq: Two times a day (BID) | ORAL | 11 refills | Status: DC

## 2016-12-10 MED ORDER — NICOTINE 21 MG/24HR TD PT24: 21.0000 mg | MEDICATED_PATCH | Freq: Every day | TRANSDERMAL | 3 refills | Status: DC
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
## 2016-12-10 NOTE — Patient Instructions (Signed)
Medication Instructions:  Your physician recommends that you continue on your current medications as directed. Please refer to the Current Medication list given to you today.  Labwork: None ordered  Testing/Procedures: None ordered  Follow-Up: Your physician recommends that you schedule a follow-up appointment in: 3 months with Dr. Agustin Cree   Any Other Special Instructions Will Be Listed Below (If Applicable).     If you need a refill on your cardiac medications before your next appointment, please call your pharmacy.

## 2016-12-10 NOTE — Addendum Note (Signed)
Addended by: Aleatha Borer on: 12/10/2016 03:54 PM   Modules accepted: Orders

## 2016-12-11 ENCOUNTER — Telehealth: Payer: Self-pay | Admitting: Internal Medicine

## 2016-12-11 NOTE — Telephone Encounter (Signed)
Left message per 9/24 sch message to see if patient wants to r/s missed [place] appt.

## 2016-12-12 ENCOUNTER — Telehealth: Payer: Self-pay

## 2016-12-12 NOTE — Telephone Encounter (Signed)
Nutrition  Patient identified on Malnutrition Screening Tool for weight loss and poor appetite.    Called and spoke with patient's wife, patient currently asleep.  Wife reports no appetite and interested in nutrition appointment.  Nutrition appointment scheduled for Eric Mason on 10/5 at [place].  Wife agreeable to appointment time.  Eric Mason B. Zenia Resides, [name], [name] Registered Dietitian [phone] (pager)

## 2016-12-14 ENCOUNTER — Other Ambulatory Visit: Payer: Medicaid Other

## 2016-12-14 ENCOUNTER — Encounter: Payer: Medicaid Other | Admitting: Nutrition

## 2016-12-14 ENCOUNTER — Ambulatory Visit (HOSPITAL_COMMUNITY): Payer: Medicaid Other

## 2016-12-14 ENCOUNTER — Encounter: Payer: Self-pay | Admitting: Nutrition

## 2016-12-14 ENCOUNTER — Ambulatory Visit (HOSPITAL_COMMUNITY): Admission: RE | Admit: 2016-12-14 | Payer: Medicaid Other | Source: Ambulatory Visit

## 2016-12-14 NOTE — Progress Notes (Signed)
Patient did not show up for scheduled nutrition appointment.

## 2016-12-18 ENCOUNTER — Telehealth: Payer: Self-pay | Admitting: Oncology

## 2016-12-18 ENCOUNTER — Ambulatory Visit (HOSPITAL_BASED_OUTPATIENT_CLINIC_OR_DEPARTMENT_OTHER): Payer: Medicaid Other | Admitting: Oncology

## 2016-12-18 VITALS — BP 126/89 | HR 74 | Temp 98.1°F | Resp 18 | Ht 70.0 in | Wt 141.6 lb

## 2016-12-18 DIAGNOSIS — Z85841 Personal history of malignant neoplasm of brain: Secondary | ICD-10-CM

## 2016-12-18 DIAGNOSIS — C763 Malignant neoplasm of pelvis: Secondary | ICD-10-CM

## 2016-12-18 DIAGNOSIS — R591 Generalized enlarged lymph nodes: Secondary | ICD-10-CM

## 2016-12-18 DIAGNOSIS — C7931 Secondary malignant neoplasm of brain: Secondary | ICD-10-CM

## 2016-12-18 NOTE — Progress Notes (Signed)
Hematology and Oncology Follow Up Visit  Eric Mason [id number] 10-Apr-1970 46 y.o. 12/18/2016 11:29 AM Eric Mason, [name], Eric Batman, MD   Principle Diagnosis: 46 year old with poorly differentiated neoplasm presented with brain metastasis in May 2018. 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   Prior Therapy: Status post craniotomy and resection in May 2018. This was followed by [facility] completed in June 2018.  Current therapy: Observation and surveillance.  Interim History: Eric Mason presents today for a follow-up visit. Since the last visit, he reports no major changes in his health. He continues to smoke heavily but has not reported any recent illnesses or hospitalizations. He have lost some weight but remains ambulatory. He does report to me and lower extremity pain and had a ultrasound Doppler of his lower extremities done on 11/29/2016. Did not show any deep pain thrombosis but does suggest pelvic adenopathy.  He does not report any headaches, blurry vision, syncope or seizures. He does not report any fevers or chills or sweats. He does not report any cough, wheezing or hemoptysis. He is not reporting nausea, vomiting or abdominal pain. He does not report any frequency urgency or hesitancy. He does not report any skeletal complaints. Remaining review of systems unremarkable.    Medications: I have reviewed the patient's current medications.  Current Outpatient Prescriptions  Medication Sig Dispense Refill  . albuterol (PROVENTIL HFA;VENTOLIN HFA) 108 (90 Base) MCG/ACT inhaler Inhale 2 puffs into the lungs every 6 (six) hours as needed for wheezing or shortness of breath. 1 Inhaler 2  . amitriptyline (ELAVIL) 25 MG tablet Take 25 mg by mouth at bedtime.    . Calcium Carb-Cholecalciferol (CALCIUM 500 +D) 500-400 MG-UNIT TABS 1 tab PO BID 60 tablet 2  . folic acid (FOLVITE) 1 MG tablet Take 1 tablet (1 mg total) by mouth daily.  100 tablet 1  . gabapentin (NEURONTIN) 300 MG capsule Take 300 mg by mouth 3 (three) times daily.    [name] levETIRAcetam (KEPPRA) 500 MG tablet Take 1 tablet (500 mg total) by mouth 2 (two) times daily. 60 tablet 1  . Multiple Vitamin (MULTIVITAMIN) tablet Take 1 tablet by mouth daily.    . nicotine (NICODERM CQ - DOSED IN MG/24 HOURS) 21 mg/24hr patch Place 1 patch (21 mg total) onto the skin daily. 28 patch 3  . ondansetron (ZOFRAN) 8 MG tablet Take 1 tablet (8 mg total) by mouth every 8 (eight) hours as needed for nausea or vomiting. 30 tablet 0  . OxyCODONE HCl (ROXICODONE PO) Take 10 mg by mouth every 6 (six) hours.    . pantoprazole (PROTONIX) 40 MG tablet Take 1 tablet (40 mg total) by mouth 2 (two) times daily. 60 tablet 0  . ranolazine (RANEXA) 500 MG 12 hr tablet Take 1 tablet (500 mg total) by mouth 2 (two) times daily. 60 tablet 11  . senna-docusate (SENNA S) 8.6-50 MG tablet Take 1 tablet by mouth daily. 30 tablet 3  . thiamine (VITAMIN B-1) 100 MG tablet Take 1 tablet (100 mg total) by mouth daily. 100 tablet 0   No current facility-administered medications for this visit.      Allergies:  Allergies  Allergen Reactions  . Latex     UNSPECIFIED REACTION     Past Medical History, Surgical history, Social history, and Family History were reviewed and updated.  Physical Exam: Blood pressure 126/89, pulse 74, temperature 98.1 F (36.7 C), temperature source Oral, resp. rate 18, height 5'  10" (1.778 m), weight 141 lb 9.6 oz (64.2 kg), SpO2 100 %. ECOG: 1 General appearance: alert and cooperative Head: Normocephalic, without obvious abnormality Neck: no adenopathy Lymph nodes: Cervical, supraclavicular, and axillary nodes normal. Heart:regular rate and rhythm, S1, S2 normal, no murmur, click, rub or gallop Lung:chest clear, no wheezing, rales, normal symmetric air entry, Heart exam - S1, S2 normal, no murmur, no gallop, rate regular Abdomin: soft, non-tender, without masses or  organomegaly EXT:no erythema, induration, or nodules   Lab Results: Lab Results  Component Value Date   WBC 7.6 10/30/2016   HGB 11.1 (L) 10/30/2016   HCT 33.8 (L) 10/30/2016   MCV 91.5 10/30/2016   PLT 339 10/30/2016     Chemistry      Component Value Date/Time   NA 137 10/30/2016 0909   K 3.3 (L) 10/30/2016 0909   CL 103 09/25/2016 1520   CO2 27 10/30/2016 0909   BUN 4.8 (L) 10/30/2016 0909   CREATININE 0.9 10/30/2016 0909      Component Value Date/Time   CALCIUM 9.3 10/30/2016 0909   ALKPHOS 93 10/30/2016 0909   AST 35 (H) 10/30/2016 0909   ALT 25 10/30/2016 0909   BILITOT 0.41 10/30/2016 0909       IMPRESSION: 1. Technically adequate exam showing no acute pulmonary embolus. 2. AP window lymph node appears smaller. 3. Right upper lobe and left lower lobe nodule appear unchanged. 4. New right upper lobe nodule measuring 7 mm. 5. Coronary artery disease. 6. Aortic Atherosclerosis (ICD10-I70.0) and Emphysema (ICD10-J43.9).  Incidental findings are consistent with: enlarged lymph node on the right and enlarged lymph node on the left. - No evidence of deep vein or superficial thrombosis involving the right lower extremity and left lower extremity. - No evidence of Baker&'s cyst on the left.  Impression and Plan:  46 year old gentleman with the following issues:  1. 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 He presented with a solitary temporal mass measuring 12 mm with vasogenic edema after a seizure. 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 Although metastatic carcinoma could not be ruled out.  CT scan of the chest done in July 2018 showed right upper lobe and lower lobe small nodules to be unchanged with a 7 mm lung nodule noted.  Ultrasound Dopplers of his lower extremity did show enlarged bilateral pelvic lymph nodes that  needs further evaluation.  The plan is to repeat his CT scan of the abdomen and pelvis to evaluate for any recurrent malignancy. Further biopsy may be needed if abnormality is noted.  If his CT scans continue to show no clear-cut systemic disease, continued observation is warranted.   2. Brain metastasis: Repeat MRI on 11/13/2016 showed no evidence of recurrent disease.  3. Follow-up: Will be in 3 months sooner if any abnormalities in detected on his abdominal imaging.   Zola Button, MD 10/9/201811:29 AM

## 2016-12-18 NOTE — Telephone Encounter (Signed)
Gave avs and calendar for October and January 2019

## 2016-12-27 ENCOUNTER — Other Ambulatory Visit: Payer: Medicaid Other

## 2017-01-02 ENCOUNTER — Other Ambulatory Visit: Payer: Self-pay | Admitting: Oncology

## 2017-01-02 DIAGNOSIS — C801 Malignant (primary) neoplasm, unspecified: Secondary | ICD-10-CM

## 2017-01-02 DIAGNOSIS — C7931 Secondary malignant neoplasm of brain: Secondary | ICD-10-CM

## 2017-01-03 ENCOUNTER — Encounter (HOSPITAL_COMMUNITY): Payer: Self-pay

## 2017-01-03 ENCOUNTER — Ambulatory Visit (HOSPITAL_COMMUNITY)
Admission: RE | Admit: 2017-01-03 | Discharge: 2017-01-03 | Disposition: A | Discharge status: Home / Self Care | Payer: Medicaid Other | Source: Ambulatory Visit | Attending: Oncology | Admitting: Oncology

## 2017-01-03 DIAGNOSIS — C801 Malignant (primary) neoplasm, unspecified: Secondary | ICD-10-CM | POA: Insufficient documentation

## 2017-01-03 DIAGNOSIS — R918 Other nonspecific abnormal finding of lung field: Secondary | ICD-10-CM | POA: Insufficient documentation

## 2017-01-03 DIAGNOSIS — C7931 Secondary malignant neoplasm of brain: Secondary | ICD-10-CM | POA: Insufficient documentation

## 2017-01-03 MED ORDER — IOPAMIDOL (ISOVUE-300) INJECTION 61%
INTRAVENOUS | Status: AC
  Filled 2017-01-03: qty 100

## 2017-01-03 MED ORDER — IOPAMIDOL (ISOVUE-300) INJECTION 61%
100.0000 mL | Freq: Once | INTRAVENOUS | Status: AC | PRN
  Administered 2017-01-03: 100 mL via INTRAVENOUS

## 2017-01-27 ENCOUNTER — Other Ambulatory Visit: Payer: Self-pay | Admitting: Radiation Therapy

## 2017-01-27 DIAGNOSIS — C7931 Secondary malignant neoplasm of brain: Secondary | ICD-10-CM

## 2017-01-27 DIAGNOSIS — C7949 Secondary malignant neoplasm of other parts of nervous system: Principal | ICD-10-CM

## 2017-01-28 ENCOUNTER — Telehealth: Payer: Self-pay | Admitting: Internal Medicine

## 2017-01-28 DIAGNOSIS — R112 Nausea with vomiting, unspecified: Secondary | ICD-10-CM

## 2017-01-28 MED ORDER — PANTOPRAZOLE SODIUM 40 MG PO TBEC: 40.0000 mg | DELAYED_RELEASE_TABLET | Freq: Two times a day (BID) | ORAL | 0 refills | Status: DC

## 2017-01-28 MED ORDER — LEVETIRACETAM 500 MG PO TABS
500.0000 mg | ORAL_TABLET | Freq: Two times a day (BID) | ORAL | 0 refills | Status: DC
Start: 2017-01-28 — End: 2017-02-28

## 2017-01-28 NOTE — Telephone Encounter (Signed)
Pt called to request a refill on  -pantoprazole (PROTONIX) 40 MG tablet  -levETIRAcetam (KEPPRA) 500 MG tablet [[id number]] ENDED  Please follow up If approved please send it to  [facility] pharmacy on [address]. Phone [phone]

## 2017-01-28 NOTE — Telephone Encounter (Signed)
Refilled

## 2017-01-28 NOTE — Addendum Note (Signed)
Addended by: Rica Mast on: 01/28/2017 03:16 PM   Modules accepted: Orders

## 2017-02-07 ENCOUNTER — Ambulatory Visit: Payer: Medicaid Other | Attending: Internal Medicine | Admitting: Internal Medicine

## 2017-02-07 ENCOUNTER — Encounter: Payer: Self-pay | Admitting: Internal Medicine

## 2017-02-07 VITALS — BP 110/73 | HR 85 | Temp 98.7°F | Resp 18 | Ht 70.0 in | Wt 132.0 lb

## 2017-02-07 DIAGNOSIS — F17200 Nicotine dependence, unspecified, uncomplicated: Secondary | ICD-10-CM | POA: Insufficient documentation

## 2017-02-07 DIAGNOSIS — K219 Gastro-esophageal reflux disease without esophagitis: Secondary | ICD-10-CM | POA: Diagnosis not present

## 2017-02-07 DIAGNOSIS — R569 Unspecified convulsions: Secondary | ICD-10-CM | POA: Insufficient documentation

## 2017-02-07 DIAGNOSIS — I2510 Atherosclerotic heart disease of native coronary artery without angina pectoris: Secondary | ICD-10-CM | POA: Diagnosis not present

## 2017-02-07 DIAGNOSIS — N6342 Unspecified lump in left breast, subareolar: Secondary | ICD-10-CM | POA: Diagnosis not present

## 2017-02-07 DIAGNOSIS — N63 Unspecified lump in unspecified breast: Secondary | ICD-10-CM

## 2017-02-07 DIAGNOSIS — Z808 Family history of malignant neoplasm of other organs or systems: Secondary | ICD-10-CM | POA: Diagnosis not present

## 2017-02-07 DIAGNOSIS — C7931 Secondary malignant neoplasm of brain: Secondary | ICD-10-CM | POA: Insufficient documentation

## 2017-02-07 DIAGNOSIS — Z23 Encounter for immunization: Secondary | ICD-10-CM | POA: Insufficient documentation

## 2017-02-07 DIAGNOSIS — M79604 Pain in right leg: Secondary | ICD-10-CM | POA: Diagnosis not present

## 2017-02-07 DIAGNOSIS — R0602 Shortness of breath: Secondary | ICD-10-CM | POA: Insufficient documentation

## 2017-02-07 DIAGNOSIS — M79605 Pain in left leg: Secondary | ICD-10-CM | POA: Diagnosis not present

## 2017-02-07 DIAGNOSIS — R9439 Abnormal result of other cardiovascular function study: Secondary | ICD-10-CM | POA: Diagnosis not present

## 2017-02-07 DIAGNOSIS — Z79899 Other long term (current) drug therapy: Secondary | ICD-10-CM | POA: Diagnosis not present

## 2017-02-07 NOTE — Progress Notes (Signed)
Patient ID: Eric Mason, male    DOB: [birth year]  MRN: [id number]  CC: Medication Refill   Subjective: Eric Mason is a 46 y.o. male who presents for chronic disease management.  Last seen July 2018.  Wife is with him. 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 CP/SOB: Since last visit with me patient had seen the cardiologist.  Stress test was abnormal.  They decided on medical management.  Patient is on Ranexa.  2. Lump on LT nipple: Noticed this several days ago.  Slight tender to touch.  This cancer.  3. Brain mets: Found to have inguinal lymphadenopathy on Doppler ultrasound done recently Followed by oncology. Patient requesting letter for his probation officer.  He has problems with transportation and has to walk about a quarter of a mild from his house to his probation officer's office. -Still seeing pain specialist, Dr. Maryjean Ka.  For pain/numbness in legs and head.  Had EMG. I do not know the results of that study. On Oxydo 7.5 mg Q 6 hrs and a long acting narcotic (he does not recall the name)  -c/o pains in legs with walking and at nights. Can walk about 30 feet before he has to stop, also lightheaded with walking.  4. Smoking about 3 cig /day.  Trying to quit Patient Active Problem List   Diagnosis Date Noted  . Abnormal stress test 11/08/2016  . Bilateral leg pain 11/08/2016  . Gastroesophageal reflux disease 10/10/2016  . Atypical chest pain 10/10/2016  . Acute esophagitis   . Intractable nausea and vomiting 07/04/2016  . Odynophagia 07/04/2016  . Tobacco abuse 07/04/2016  . Dehydration   . Brain metastasis ([place]) 07/02/2016  . Pulmonary nodule 06/19/2016  . Seizure ([place]) 06/17/2016  . Hx of pneumothorax 05/15/2016     Current Outpatient Medications on File Prior to Visit  Medication Sig Dispense Refill  . albuterol  (PROVENTIL HFA;VENTOLIN HFA) 108 (90 Base) MCG/ACT inhaler Inhale 2 puffs into the lungs every 6 (six) hours as needed for wheezing or shortness of breath. 1 Inhaler 2  . amitriptyline (ELAVIL) 25 MG tablet Take 25 mg by mouth at bedtime.    . Calcium Carb-Cholecalciferol (CALCIUM 500 +D) 500-400 MG-UNIT TABS 1 tab PO BID 60 tablet 2  . folic acid (FOLVITE) 1 MG tablet Take 1 tablet (1 mg total) by mouth daily. 100 tablet 1  . gabapentin (NEURONTIN) 300 MG capsule Take 300 mg by mouth 3 (three) times daily.    [name] levETIRAcetam (KEPPRA) 500 MG tablet Take 1 tablet (500 mg total) 2 (two) times daily by mouth. 60 tablet 0  . Multiple Vitamin (MULTIVITAMIN) tablet Take 1 tablet by mouth daily.    . nicotine (NICODERM CQ - DOSED IN MG/24 HOURS) 21 mg/24hr patch Place 1 patch (21 mg total) onto the skin daily. 28 patch 3  . ondansetron (ZOFRAN) 8 MG tablet Take 1 tablet (8 mg total) by mouth every 8 (eight) hours as needed for nausea or vomiting. 30 tablet 0  . OxyCODONE HCl (ROXICODONE PO) Take 10 mg by mouth every 6 (six) hours.    . pantoprazole (PROTONIX) 40 MG tablet Take 1 tablet (40 mg total) 2 (two) times daily by mouth. 60 tablet 0  . ranolazine (RANEXA) 500 MG 12 hr tablet Take 1 tablet (500 mg total) by mouth 2 (two) times daily.  60 tablet 11  . senna-docusate (SENNA S) 8.6-50 MG tablet Take 1 tablet by mouth daily. 30 tablet 3  . thiamine (VITAMIN B-1) 100 MG tablet Take 1 tablet (100 mg total) by mouth daily. 100 tablet 0   No current facility-administered medications on file prior to visit.     Allergies  Allergen Reactions  . Latex     UNSPECIFIED REACTION     Social History   Socioeconomic History  . Marital status: Married    Spouse name: Not on file  . Number of children: Not on file  . Years of education: Not on file  . Highest education level: Not on file  Social Needs  . Financial resource strain: Not on file  . Food insecurity - worry: Not on file  . Food insecurity  - inability: Not on file  . Transportation needs - medical: Not on file  . Transportation needs - non-medical: Not on file  Occupational History  . Not on file  Tobacco Use  . Smoking status: Current Every Day Smoker  . Smokeless tobacco: Never Used  . Tobacco comment: attempting to stop  Substance and Sexual Activity  . Alcohol use: No    Comment: 2-3 cans of beer daily  . Drug use: No  . Sexual activity: Not on file  Other Topics Concern  . Not on file  Social History Narrative  . Not on file    Family History  Problem Relation Age of Onset  . Hypertension Mother   . Thyroid cancer Mother   . Hypertension Father     Past Surgical History:  Procedure Laterality Date  . APPLICATION OF CRANIAL NAVIGATION N/A 07/11/2016   Procedure: APPLICATION OF CRANIAL NAVIGATION;  Surgeon: Kevan Ny Ditty, MD;  Location: [place];  Service: Neurosurgery;  Laterality: N/A;  . CRANIOTOMY N/A 07/11/2016   Procedure: Right Temporal craniotomy with brainlab;  Surgeon: Kevan Ny Ditty, MD;  Location: [place];  Service: Neurosurgery;  Laterality: N/A;  Right Temporal   . ESOPHAGOGASTRODUODENOSCOPY N/A 07/09/2016   Procedure: ESOPHAGOGASTRODUODENOSCOPY (EGD);  Surgeon: Jerene Bears, MD;  Location[facility] ENDOSCOPY;  Service: Endoscopy;  Laterality: N/A;  . SHOULDER SURGERY Right   . VIDEO BRONCHOSCOPY WITH ENDOBRONCHIAL NAVIGATION N/A 06/20/2016   Procedure: VIDEO BRONCHOSCOPY WITH ENDOBRONCHIAL NAVIGATION;  Surgeon: Collene Gobble, MD;  Location: [facility] OR;  Service: Thoracic;  Laterality: N/A;    ROS: Review of Systems  Gastrointestinal:       No further problems with dysphasia or thrush since oral steroid was stopped.  Weight has decreased since being off of steroid.    PHYSICAL EXAM: BP 110/73 (BP Location: Left Arm, Patient Position: Sitting, Cuff Size: Normal)   Pulse 85   Temp 98.7 F (37.1 C) (Oral)   Resp 18   Ht '5\' 10"'$  (1.778 m)   Wt 132 lb (59.9 kg)   SpO2 100%   BMI 18.94 kg/m     Wt Readings from Last 3 Encounters:  02/07/17 132 lb (59.9 kg)  12/18/16 141 lb 9.6 oz (64.2 kg)  12/10/16 148 lb 1.9 oz (67.2 kg)    Physical Exam  General appearance -middle-aged African-American male in NAD.he has noted weight loss since I last saw him  mental status - alert, oriented to person, place, and time.  Flat affect Mouth -mucosa moist without lesions Neck - supple, no significant adenopathy Chest - clear to auscultation, no wheezes, rales or rhonchi, symmetric air entry Heart - normal rate, regular rhythm,  normal S1, S2, no murmurs, rubs, clicks or gallops Extremities -legs are warm.  Dorsalis pedis and posterior tibialis pulses 3+.  Popliteal and femoral pulses 3 + BL Breast: Small firm nodular, mobile lesion felt around and below the left nipple  ABI:1.44 LT, 1.22 RT  Lab Results  Component Value Date   WBC 7.6 10/30/2016   HGB 11.1 (L) 10/30/2016   HCT 33.8 (L) 10/30/2016   MCV 91.5 10/30/2016   PLT 339 10/30/2016     Chemistry      Component Value Date/Time   NA 137 10/30/2016 0909   K 3.3 (L) 10/30/2016 0909   CL 103 09/25/2016 1520   CO2 27 10/30/2016 0909   BUN 4.8 (L) 10/30/2016 0909   CREATININE 0.9 10/30/2016 0909      Component Value Date/Time   CALCIUM 9.3 10/30/2016 0909   ALKPHOS 93 10/30/2016 0909   AST 35 (H) 10/30/2016 0909   ALT 25 10/30/2016 0909   BILITOT 0.41 10/30/2016 0909      ASSESSMENT AND PLAN: 1. Pain in both lower extremities -Physical exam finding and ABI today does not suggest PAD -I would be interested in knowing the results of EMG that he had done. Letter given for his parole officer  2. Breast lump - MM Digital Diagnostic Unilat L; Future  3. Need for influenza vaccination - Flu Vaccine QUAD 6+ mos PF IM (Fluarix Quad PF)  4. Coronary artery disease involving native heart without angina pectoris, unspecified vessel or lesion type Stable on Ranexa Smoking cessation encouraged  On  follow-up visit I would  like to discuss the results of CAT scan of the abdomen done 12/2016 that showed groundglass opacities in both lungs.  Patient was given the opportunity to ask questions.  Patient verbalized understanding of the plan and was able to repeat key elements of the plan.   Orders Placed This Encounter  Procedures  . MM Digital Diagnostic Unilat L  . Flu Vaccine QUAD 6+ mos PF IM (Fluarix Quad PF)     Requested Prescriptions    No prescriptions requested or ordered in this encounter    Return in about 7 weeks (around 03/28/2017).  Karle Plumber, MD, FACP

## 2017-02-11 ENCOUNTER — Other Ambulatory Visit: Payer: Self-pay | Admitting: Internal Medicine

## 2017-02-11 DIAGNOSIS — N63 Unspecified lump in unspecified breast: Secondary | ICD-10-CM

## 2017-02-14 ENCOUNTER — Ambulatory Visit (HOSPITAL_COMMUNITY): Admission: RE | Admit: 2017-02-14 | Payer: Medicaid Other | Source: Ambulatory Visit

## 2017-02-14 ENCOUNTER — Telehealth: Payer: Self-pay | Admitting: Radiation Oncology

## 2017-02-14 NOTE — Telephone Encounter (Signed)
error 

## 2017-02-18 ENCOUNTER — Inpatient Hospital Stay
Admission: RE | Admit: 2017-02-18 | Discharge: 2017-02-18 | Disposition: A | Discharge status: Home / Self Care | Payer: Self-pay | Source: Ambulatory Visit | Attending: Radiation Oncology | Admitting: Radiation Oncology

## 2017-02-18 ENCOUNTER — Ambulatory Visit: Payer: Medicaid Other | Admitting: Internal Medicine

## 2017-02-19 ENCOUNTER — Other Ambulatory Visit: Payer: Medicaid Other

## 2017-02-25 ENCOUNTER — Telehealth: Payer: Self-pay | Admitting: Internal Medicine

## 2017-02-25 MED ORDER — ALBUTEROL SULFATE HFA 108 (90 BASE) MCG/ACT IN AERS
2.0000 | INHALATION_SPRAY | Freq: Four times a day (QID) | RESPIRATORY_TRACT | 2 refills | Status: DC | PRN
Start: 2017-02-25 — End: 2017-02-28

## 2017-02-25 NOTE — Telephone Encounter (Signed)
I have not received any paperwork or calls from them. Albuterol refilled.

## 2017-02-25 NOTE — Telephone Encounter (Signed)
Patient called asking for albuterol refill. Please fu. [facility] told him that they haven been able to reach [place]. Please send it to them. Thank you

## 2017-02-28 ENCOUNTER — Ambulatory Visit
Admission: RE | Admit: 2017-02-28 | Discharge: 2017-02-28 | Disposition: A | Discharge status: Home / Self Care | Payer: Medicaid Other | Source: Ambulatory Visit | Attending: Internal Medicine | Admitting: Internal Medicine

## 2017-02-28 ENCOUNTER — Telehealth: Payer: Self-pay | Admitting: Internal Medicine

## 2017-02-28 ENCOUNTER — Ambulatory Visit: Payer: Medicaid Other

## 2017-02-28 DIAGNOSIS — N63 Unspecified lump in unspecified breast: Secondary | ICD-10-CM

## 2017-02-28 MED ORDER — LEVETIRACETAM 500 MG PO TABS
500.0000 mg | ORAL_TABLET | Freq: Two times a day (BID) | ORAL | 5 refills | Status: DC
Start: 2017-02-28 — End: 2017-03-26

## 2017-02-28 MED ORDER — ALBUTEROL SULFATE HFA 108 (90 BASE) MCG/ACT IN AERS: 2.0000 | INHALATION_SPRAY | Freq: Four times a day (QID) | RESPIRATORY_TRACT | 6 refills | Status: DC | PRN

## 2017-02-28 NOTE — Telephone Encounter (Signed)
Phone call placed to patient today to discuss mammogram results.  Radiologist did not see any underlying mass below the left nipple.  He noted benign gynecomastia.  Patient reports that since last visit with me the area of concern around the left nipple has decreased in size.  He will follow-up with me sometime in January for repeat clinical exam. Patient requesting refill on Keppra and albuterol inhaler to be sent to [place].

## 2017-03-01 ENCOUNTER — Telehealth: Payer: Self-pay | Admitting: Internal Medicine

## 2017-03-01 NOTE — Telephone Encounter (Signed)
Per provider I called pt to schedule appt

## 2017-03-14 ENCOUNTER — Ambulatory Visit (HOSPITAL_COMMUNITY): Admission: RE | Admit: 2017-03-14 | Payer: Medicaid Other | Source: Ambulatory Visit

## 2017-03-15 ENCOUNTER — Ambulatory Visit: Payer: Medicaid Other | Admitting: Cardiology

## 2017-03-17 ENCOUNTER — Telehealth: Payer: Self-pay | Admitting: Oncology

## 2017-03-17 NOTE — Telephone Encounter (Signed)
S/W PT ADVISED APPT TIME CHG ON 1/9 FROM 1.15 TO 12P DUE TO MD CALL.

## 2017-03-18 ENCOUNTER — Encounter: Payer: Self-pay | Admitting: Neurosurgery

## 2017-03-18 ENCOUNTER — Inpatient Hospital Stay
Admission: RE | Admit: 2017-03-18 | Discharge: 2017-03-18 | Disposition: A | Discharge status: Home / Self Care | Payer: Self-pay | Source: Ambulatory Visit | Attending: Radiation Oncology | Admitting: Radiation Oncology

## 2017-03-18 NOTE — Progress Notes (Deleted)
Eric Mason 47 y.o. 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   PAIN: He is currently NEURO:Alert and oriented x 3 with fluent speech,able to complete sentences without difficulty with word finding or organization of sentences. Reports,denies any visual disturbance,blurred vision,double vision,blind spots or peripheral vision changes: Fine motor movement-picking up objects with fingers,holding objects, writing, weakness of lower extremities: Aphasia/Slurred speech: Weight: Appetite:

## 2017-03-19 ENCOUNTER — Ambulatory Visit: Payer: Medicaid Other | Admitting: Internal Medicine

## 2017-03-20 ENCOUNTER — Ambulatory Visit: Payer: Medicaid Other | Admitting: Oncology

## 2017-03-22 ENCOUNTER — Ambulatory Visit (HOSPITAL_COMMUNITY): Admission: RE | Admit: 2017-03-22 | Payer: Medicaid Other | Source: Ambulatory Visit

## 2017-03-25 ENCOUNTER — Ambulatory Visit (HOSPITAL_COMMUNITY)
Admission: RE | Admit: 2017-03-25 | Discharge: 2017-03-25 | Disposition: A | Discharge status: Home / Self Care | Payer: Medicaid Other | Source: Ambulatory Visit | Attending: Radiation Oncology | Admitting: Radiation Oncology

## 2017-03-25 ENCOUNTER — Ambulatory Visit: Payer: Self-pay | Admitting: Urology

## 2017-03-25 DIAGNOSIS — C7931 Secondary malignant neoplasm of brain: Secondary | ICD-10-CM | POA: Insufficient documentation

## 2017-03-25 DIAGNOSIS — C801 Malignant (primary) neoplasm, unspecified: Secondary | ICD-10-CM | POA: Insufficient documentation

## 2017-03-25 DIAGNOSIS — C7949 Secondary malignant neoplasm of other parts of nervous system: Secondary | ICD-10-CM

## 2017-03-25 LAB — CREATININE, SERUM
CREATININE: 0.77 mg/dL (ref 0.61–1.24)
GFR calc Af Amer: >60 mL/min (ref >60)
GFR calc non Af Amer: >60 mL/min (ref >60)

## 2017-03-25 MED ORDER — GADOBENATE DIMEGLUMINE 529 MG/ML IV SOLN
13.0000 mL | Freq: Once | INTRAVENOUS | Status: AC
  Administered 2017-03-25: 13 mL via INTRAVENOUS

## 2017-03-26 ENCOUNTER — Encounter: Payer: Self-pay | Admitting: *Deleted

## 2017-03-26 ENCOUNTER — Encounter: Payer: Self-pay | Admitting: Urology

## 2017-03-26 ENCOUNTER — Encounter: Payer: Self-pay | Admitting: Internal Medicine

## 2017-03-26 ENCOUNTER — Telehealth: Payer: Self-pay | Admitting: Oncology

## 2017-03-26 ENCOUNTER — Ambulatory Visit
Admission: RE | Admit: 2017-03-26 | Discharge: 2017-03-26 | Disposition: A | Discharge status: Home / Self Care | Payer: Medicaid Other | Source: Ambulatory Visit | Attending: Urology | Admitting: Urology

## 2017-03-26 ENCOUNTER — Other Ambulatory Visit: Payer: Self-pay

## 2017-03-26 ENCOUNTER — Ambulatory Visit: Payer: Medicaid Other | Admitting: Internal Medicine

## 2017-03-26 ENCOUNTER — Inpatient Hospital Stay: Payer: Medicaid Other | Attending: Internal Medicine | Admitting: Internal Medicine

## 2017-03-26 VITALS — BP 123/87 | HR 73 | Temp 98.0°F | Resp 18 | Ht 70.0 in | Wt 123.9 lb

## 2017-03-26 VITALS — BP 132/98 | HR 70 | Temp 98.1°F | Resp 18 | Wt 123.2 lb

## 2017-03-26 DIAGNOSIS — Z510 Encounter for antineoplastic radiation therapy: Secondary | ICD-10-CM | POA: Diagnosis present

## 2017-03-26 DIAGNOSIS — D492 Neoplasm of unspecified behavior of bone, soft tissue, and skin: Secondary | ICD-10-CM | POA: Insufficient documentation

## 2017-03-26 DIAGNOSIS — N62 Hypertrophy of breast: Secondary | ICD-10-CM | POA: Insufficient documentation

## 2017-03-26 DIAGNOSIS — N6320 Unspecified lump in the left breast, unspecified quadrant: Secondary | ICD-10-CM | POA: Diagnosis not present

## 2017-03-26 DIAGNOSIS — C801 Malignant (primary) neoplasm, unspecified: Secondary | ICD-10-CM | POA: Insufficient documentation

## 2017-03-26 DIAGNOSIS — N6342 Unspecified lump in left breast, subareolar: Secondary | ICD-10-CM | POA: Insufficient documentation

## 2017-03-26 DIAGNOSIS — Z808 Family history of malignant neoplasm of other organs or systems: Secondary | ICD-10-CM | POA: Diagnosis not present

## 2017-03-26 DIAGNOSIS — Z8249 Family history of ischemic heart disease and other diseases of the circulatory system: Secondary | ICD-10-CM | POA: Diagnosis not present

## 2017-03-26 DIAGNOSIS — Z91040 Latex allergy status: Secondary | ICD-10-CM | POA: Diagnosis not present

## 2017-03-26 DIAGNOSIS — F17200 Nicotine dependence, unspecified, uncomplicated: Secondary | ICD-10-CM | POA: Insufficient documentation

## 2017-03-26 DIAGNOSIS — M545 Low back pain: Secondary | ICD-10-CM | POA: Diagnosis not present

## 2017-03-26 DIAGNOSIS — R51 Headache: Secondary | ICD-10-CM | POA: Insufficient documentation

## 2017-03-26 DIAGNOSIS — Z79899 Other long term (current) drug therapy: Secondary | ICD-10-CM | POA: Diagnosis not present

## 2017-03-26 DIAGNOSIS — R569 Unspecified convulsions: Secondary | ICD-10-CM | POA: Diagnosis not present

## 2017-03-26 DIAGNOSIS — C7931 Secondary malignant neoplasm of brain: Secondary | ICD-10-CM | POA: Insufficient documentation

## 2017-03-26 DIAGNOSIS — M79605 Pain in left leg: Secondary | ICD-10-CM

## 2017-03-26 DIAGNOSIS — M79604 Pain in right leg: Secondary | ICD-10-CM

## 2017-03-26 MED ORDER — ONDANSETRON HCL 8 MG PO TABS: 8.0000 mg | ORAL_TABLET | Freq: Three times a day (TID) | ORAL | 3 refills | Status: DC | PRN

## 2017-03-26 MED ORDER — DEXAMETHASONE 4 MG PO TABS: 4.0000 mg | ORAL_TABLET | Freq: Two times a day (BID) | ORAL | 1 refills | Status: DC

## 2017-03-26 MED ORDER — LEVETIRACETAM 1000 MG PO TABS: 1000.0000 mg | ORAL_TABLET | Freq: Two times a day (BID) | ORAL | 5 refills | Status: DC

## 2017-03-26 MED ORDER — OXYCODONE-ACETAMINOPHEN 5-325 MG PO TABS: 1.0000 | ORAL_TABLET | Freq: Four times a day (QID) | ORAL | 0 refills | Status: DC | PRN

## 2017-03-26 NOTE — Progress Notes (Signed)
Osmond left message to call [phone] ask for Tonie about his 1130 follow up appointment for today with Ashlyn Bruning, PA-C.

## 2017-03-26 NOTE — Progress Notes (Signed)
1005  Shared with Penni Homans, PA-C that Opal Sidles at [facility] called to make sure the doctor was aware of the three new intracranial metastasis areas.  I told her I would report it to the PA-C Ashlyn Bruning who will see the patient today for his follow up visit for review of imaging and the results would be reviewed and discussed with Dr. Lisbeth Renshaw prior to the appointment.
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
## 2017-03-26 NOTE — Telephone Encounter (Signed)
Scheduled appt per 1/15 sch msg - spoke with patient and confirmed appts.

## 2017-03-26 NOTE — Progress Notes (Signed)
1000 Returned call to Opal Sidles at [facility] [facility] she wanted to make sure the doctor was aware of the three new intracranial metastasis.  I told her I would report it to the PA-C Ashlyn Bruning who will see the patient today for his follow up visit for review of imaging and she will  discuss the results with Dr. Lisbeth Renshaw prior to the appointment.
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
## 2017-03-28 ENCOUNTER — Encounter: Payer: Self-pay | Admitting: General Practice

## 2017-03-28 ENCOUNTER — Ambulatory Visit
Admission: RE | Admit: 2017-03-28 | Discharge: 2017-03-28 | Disposition: A | Discharge status: Home / Self Care | Payer: Medicaid Other | Source: Ambulatory Visit | Attending: Radiation Oncology | Admitting: Radiation Oncology

## 2017-03-28 VITALS — BP 143/85 | HR 74 | Temp 98.2°F | Wt 122.4 lb

## 2017-03-28 DIAGNOSIS — Z510 Encounter for antineoplastic radiation therapy: Secondary | ICD-10-CM | POA: Diagnosis not present

## 2017-03-28 DIAGNOSIS — C7931 Secondary malignant neoplasm of brain: Secondary | ICD-10-CM

## 2017-03-28 MED ORDER — SODIUM CHLORIDE 0.9% FLUSH
10.0000 mL | Freq: Once | INTRAVENOUS | Status: AC
  Administered 2017-03-28: 10 mL via INTRAVENOUS

## 2017-03-28 NOTE — Progress Notes (Signed)
Has armband been applied?  Yes  Does patient have an allergy to IV contrast dye?: No   Has patient ever received premedication for IV contrast dye?: N/A  Does patient take metformin?: No  If patient does take metformin when was the last dose: N/A dates multiple: N/A  Date of lab work:  BUN: N/A CR: 0.77 on 03/25/17 @15 :23 GFR: >60 on 03/25/17 @ 15:23  IV site:  Right AC  Has IV site been added to flowsheet?  Yes  BP (!) 143/85   Pulse 74   Temp 98.2 F (36.8 C) (Oral)   Wt 122 lb 6.4 oz (55.5 kg)   SpO2 100%   BMI 17.56 kg/m

## 2017-03-28 NOTE — Progress Notes (Signed)
[place] CSW Progress Notes  CSW received referral from RN - patient wanted financial assistance.  CSW called patient, spoke w wife who stated they needed help w utility bill.  Per wife, Estate manager/land agent, Jenny Reichmann, has copy of bill and will be assisting.  No further questions/needs.  Reviewed offerings of [place], encouraged wife and patient to access as needed.  Edwyna Shell, LCSW Clinical Social Worker Phone:  [phone]

## 2017-03-29 ENCOUNTER — Encounter: Payer: Self-pay | Admitting: Internal Medicine

## 2017-04-03 DIAGNOSIS — Z510 Encounter for antineoplastic radiation therapy: Secondary | ICD-10-CM | POA: Diagnosis not present

## 2017-04-04 ENCOUNTER — Encounter: Payer: Self-pay | Admitting: Radiation Oncology

## 2017-04-04 ENCOUNTER — Encounter: Payer: Self-pay | Admitting: Urology

## 2017-04-04 ENCOUNTER — Other Ambulatory Visit: Payer: Self-pay | Admitting: Urology

## 2017-04-04 ENCOUNTER — Ambulatory Visit
Admission: RE | Admit: 2017-04-04 | Discharge: 2017-04-04 | Disposition: A | Discharge status: Home / Self Care | Payer: Medicaid Other | Source: Ambulatory Visit | Attending: Radiation Oncology | Admitting: Radiation Oncology

## 2017-04-04 VITALS — BP 134/94 | HR 72 | Temp 98.3°F

## 2017-04-04 DIAGNOSIS — C7931 Secondary malignant neoplasm of brain: Secondary | ICD-10-CM

## 2017-04-04 DIAGNOSIS — Z510 Encounter for antineoplastic radiation therapy: Secondary | ICD-10-CM | POA: Diagnosis not present

## 2017-04-04 MED ORDER — OXYCODONE-ACETAMINOPHEN 5-325 MG PO TABS: 1.0000 | ORAL_TABLET | Freq: Four times a day (QID) | ORAL | 0 refills | Status: DC | PRN

## 2017-04-04 NOTE — Progress Notes (Signed)
Patient requesting refill of pain medication today following SRS treatment.  Rx provided with the understanding that we will only provide pain medication to get him through to his upcoming consult with pain management on 04/30/17.  No further refills after that time. Pateint states his understanding and compliance.   Nicholos Johns, PA-C

## 2017-04-04 NOTE — Addendum Note (Signed)
Encounter addended by: Kersten Salmons, Stephani Police, RN on: 04/04/2017 9:11 AM  Actions taken: Vitals modified

## 2017-04-04 NOTE — Op Note (Signed)
  Name: Eric Mason  MRN: [id number]  Date: 04/04/2017   DOB: 21-Oct-1970  Stereotactic Radiosurgery Operative Note  PRE-OPERATIVE DIAGNOSIS:  Multiple Brain Metastases  POST-OPERATIVE DIAGNOSIS:  Multiple Brain Metastases  PROCEDURE:  Stereotactic Radiosurgery  SURGEON:  Kevan Ny Fusae Florio, MD  NARRATIVE: The patient underwent a radiation treatment planning session in the radiation oncology simulation suite under the care of the radiation oncology physician and physicist.  I participated closely in the radiation treatment planning afterwards. The patient underwent planning CT which was fused to 3T high resolution MRI with 1 mm axial slices.  These images were fused on the planning system.  We contoured the gross target volumes and subsequently expanded this to yield the Planning Target Volume. I actively participated in the planning process.  I helped to define and review the target contours and also the contours of the optic pathway, eyes, brainstem and selected nearby organs at risk.  All the dose constraints for critical structures were reviewed and compared to AAPM Task Group 101.  The prescription dose conformity was reviewed.  I approved the plan electronically.    Accordingly, Eric Mason was brought to the TrueBeam stereotactic radiation treatment linac and placed in the custom immobilization mask.  The patient was aligned according to the IR fiducial markers with BrainLab Exactrac, then orthogonal x-rays were used in ExacTrac with the 6DOF robotic table and the shifts were made to align the patient  Eric Mason received stereotactic radiosurgery uneventfully.    Lesions treated:  3   Complex lesions treated:  0 (>3.5 cm, <56mm of optic path, or within the brainstem)   The detailed description of the procedure is recorded in the radiation oncology procedure note.  I was present for the duration of the procedure.  DISPOSITION:  Following delivery, the patient was  transported to nursing in stable condition and monitored for possible acute effects to be discharged to home in stable condition with follow-up in one month.  Kevan Ny Sophee Mckimmy, MD 04/04/2017 8:19 AM

## 2017-04-04 NOTE — Progress Notes (Signed)
  Radiation Oncology         [phone] ________________________________  Name: Eric Mason MRN: [id number]  Date: 03/28/2017  DOB: 02/17/1971  DIAGNOSIS:     ICD-10-CM   1. Brain metastasis ([place]) C79.31     NARRATIVE:  The patient was brought to the [facility].  Identity was confirmed.  All relevant records and images related to the planned course of therapy were reviewed.  The patient freely provided informed written consent to proceed with treatment after reviewing the details related to the planned course of therapy. The consent form was witnessed and verified by the simulation staff. Intravenous access was established for contrast administration. Then, the patient was set-up in a stable reproducible supine position for radiation therapy.  A relocatable thermoplastic stereotactic head frame was fabricated for precise immobilization.  CT images were obtained.  Surface markings were placed.  The CT images were loaded into the planning software and fused with the patient's targeting MRI scan.  Then the target and avoidance structures were contoured.  Treatment planning then occurred.  The radiation prescription was entered and confirmed.  I have requested 3D planning  I have requested a DVH of the following structures: Brain stem, brain, left eye, right eye, lenses, optic chiasm, target volumes, uninvolved brain, and normal tissue.    SPECIAL TREATMENT PROCEDURE:  The planned course of therapy using radiation constitutes a special treatment procedure. Special care is required in the management of this patient for the following reasons. This treatment constitutes a Special Treatment Procedure for the following reason: High dose per fraction requiring special monitoring for increased toxicities of treatment including daily imaging.  The special nature of the planned course of radiotherapy will require increased physician supervision and oversight to ensure patient's safety with  optimal treatment outcomes.  PLAN:  The patient will receive 20 Gy in 1 fraction to the 3 target intracranial tumors for this course of radiation treatment.   ------------------------------------------------  Jodelle Gross, MD, PhD

## 2017-04-04 NOTE — Progress Notes (Signed)
  Radiation Oncology         [phone] ________________________________  Name: Eric Mason MRN: [id number]  Date: 04/04/2017  DOB: [birth year]   SPECIAL TREATMENT PROCEDURE   3D TREATMENT PLANNING AND DOSIMETRY: The patient's radiation plan was reviewed and approved by Dr. Cyndy Freeze from neurosurgery and radiation oncology prior to treatment. It showed 3-dimensional radiation distributions overlaid onto the planning CT/MRI image set. The [facility] for the target structures as well as the organs at risk were reviewed. The documentation of the 3D plan and dosimetry are filed in the radiation oncology EMR.   NARRATIVE: The patient was brought to the TrueBeam stereotactic radiation treatment machine and placed supine on the CT couch. The head frame was applied, and the patient was set up for stereotactic radiosurgery. Neurosurgery was present for the set-up and delivery   SIMULATION VERIFICATION: In the couch zero-angle position, the patient underwent Exactrac imaging using the Brainlab system with orthogonal KV images. These were carefully aligned and repeated to confirm treatment position for each of the isocenters. The Exactrac snap film verification was repeated at each couch angle.   SPECIAL TREATMENT PROCEDURE: The patient received stereotactic radiosurgery to the following targets:  1.  PTV2 target was treated using 3 Arcs to a prescription dose of 20 Gy. ExacTrac Snap verification was performed for each couch angle.  2.  PTV3 target was treated using 3 Arcs to a prescription dose of 20 Gy. ExacTrac Snap verification was performed for each couch angle. 3.  PTV4 target was treated using 4 Arcs to a prescription dose of 20 Gy. ExacTrac Snap verification was performed for each couch angle.  STEREOTACTIC TREATMENT MANAGEMENT: Following delivery, the patient was transported to nursing in stable condition and monitored for possible acute effects. Vital signs were recorded . The patient tolerated  treatment without significant acute effects, and was discharged to home in stable condition.  PLAN: Follow-up in one month.   ------------------------------------------------  Jodelle Gross, MD, PhD

## 2017-04-05 ENCOUNTER — Encounter: Payer: Self-pay | Admitting: *Deleted

## 2017-04-05 NOTE — Progress Notes (Signed)
1200 Returned call to wife Eric Mason who had concerns that Eric Mason was not himself having fatigue, not eating and nausea since yesterday.  She gave him a Zofran and he is much better now.  Denies having pain of any kind at this time or any neurological issues.  Ashlyn Bruning, PA-C made aware of the situation.

## 2017-04-08 ENCOUNTER — Encounter: Payer: Self-pay | Admitting: *Deleted

## 2017-04-08 ENCOUNTER — Telehealth: Payer: Self-pay | Admitting: Radiation Therapy

## 2017-04-08 NOTE — Telephone Encounter (Signed)
Mrs. Shein called today concerned for [facility]. She stated that he has been sick since Friday, 1/25 with nausea, vomiting and upset stomach. He was treated on Thursday 1/24 with a second course of SRS to the following targets:   Per Mrs. Prusinski, Thursday he was feeling fine. He continues to take 4mg  dexamethasone BID daily. Beginning on Friday 1/25, he has not been able to hold anything down and  has been feeling very bad. Dshawn has an RX for Zofran and has been trying this, but it has not helped with the nausea or vomiting. Mrs. Rosko has called requesting  Phenergan to relieve his symptoms.       I let her know that I would share this with Dr. Ida Rogue PA, Bryson Ha and that we will be back in touch with her with recommendations.   Preferred contact # [phone]  Mont Dutton R.T. (R)(T) Special Procedures Navigator

## 2017-04-08 NOTE — Progress Notes (Signed)
[place] with wife [name] concerned about husband started having vomiting over the weekend starting on Saturday and the zofran was not helping.  He has been taking Decadron 4 mg bid without tapering of the medication.   Over all he is just not feeling well fatigue and weak temperature 99.6 to 100.6 over the weekend per wife [name].  Estimated episodes of vomiting 10-15 times. Stated she was on her way to the Emergency room at [facility] to have Mr. Critzer assessed. [place], PA-C made aware of conversation with wife.

## 2017-04-11 ENCOUNTER — Telehealth: Payer: Self-pay | Admitting: Radiation Oncology

## 2017-04-11 ENCOUNTER — Telehealth: Payer: Self-pay | Admitting: Internal Medicine

## 2017-04-11 DIAGNOSIS — R112 Nausea with vomiting, unspecified: Secondary | ICD-10-CM

## 2017-04-11 MED ORDER — PANTOPRAZOLE SODIUM 40 MG PO TBEC: 40.0000 mg | DELAYED_RELEASE_TABLET | Freq: Two times a day (BID) | ORAL | 0 refills | Status: DC

## 2017-04-11 NOTE — Progress Notes (Signed)
°  Radiation Oncology         [phone]) [phone] ________________________________  Name: Eric Mason MRN: [id number]  Date: 04/04/2017  DOB: 11-16-1970  End of Treatment Note  Diagnosis:   48 y.o. 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 PTV2: 80mm right cerebellar lesion / 20 Gy in 1 fraction 2. PTV3: 16mm right occipital lesion / 20 Gy in 1 fraction 3. PTV4: 13 x 19 x 58mm lesion in the left frontal insula / 20 Gy in 1 fraction  Narrative: The patient tolerated radiation treatment well.   There were no signs of acute toxicity after treatment.  Plan: The patient has completed radiation treatment. The patient will return to radiation oncology clinic for routine followup in one month. I advised the patient to call or return sooner if they have any questions or concerns related to their recovery or treatment. ________________________________  Jodelle Gross, MD, PhD  This document serves as a record of services personally performed by Kyung Rudd, MD. It was created on his behalf by Rae Lips, a trained medical scribe. The creation of this record is based on the scribe's personal observations and the provider's statements to them. This document has been checked and approved by the attending provider.

## 2017-04-11 NOTE — Telephone Encounter (Signed)
Pt. Called requesting a refill on pantoprazole (PROTONIX) 40 MG tablet  Pt. Would like Rx sent to [facility] on [facility].  Please f/u

## 2017-04-11 NOTE — Telephone Encounter (Signed)
Refilled

## 2017-04-11 NOTE — Telephone Encounter (Signed)
I called and left a message for the patient to check on him since his wife had called earlier in the week telling us he was nauseated. He had been taking Dexamethasone 4 mg BID and today we were informed that he abruptly stopped. He was prescribed protonix earlier this week and just started this today according to his wife. I left a message stating he needed to continue his protonix, steroids, and that if he could call us back to review his symptoms we may treat him for yeast esophagitis. We will await his call.

## 2017-04-12 ENCOUNTER — Telehealth: Payer: Self-pay | Admitting: Radiation Therapy

## 2017-04-12 ENCOUNTER — Other Ambulatory Visit: Payer: Self-pay | Admitting: Urology

## 2017-04-12 MED ORDER — FLUCONAZOLE 100 MG PO TABS: 100.0000 mg | ORAL_TABLET | Freq: Every day | ORAL | 0 refills | Status: DC

## 2017-04-12 NOTE — Progress Notes (Signed)
Spoke with patient today regarding persistent headaches but he has been off his steroids recently.  Advised to resume steroids as prescribed and continue until his follow up appointment with Dr. Mickeal Skinner 04/18/17 and will discuss at that time whether he continues or tapers off.  He also c/o N/V and dysphagia which feels similar to a previous episode of esophagitis that responded well to Diflucan.  Rx for Diflucan sent to his pharmacy ([facility]).  Also advised to take Protonix daily while on steroids.   He mentioned increased SOB and orthostatic hypotension.  I advised he seek evaluation to r/o PE but patient prefers to wait since he feels fine as long as he is lying down.  Only SOB when he gets up to walk around. I again expressed concern for these symptoms and need for evaluation.  He states his understanding and reports that he will indeed seek evaluation at [facility] or [facility] should his sxs persist or worsen.   Nicholos Johns, PA-C

## 2017-04-15 ENCOUNTER — Other Ambulatory Visit: Payer: Self-pay | Admitting: Radiation Oncology

## 2017-04-15 ENCOUNTER — Telehealth: Payer: Self-pay | Admitting: Radiation Therapy

## 2017-04-15 MED ORDER — OXYCODONE-ACETAMINOPHEN 5-325 MG PO TABS: 1.0000 | ORAL_TABLET | Freq: Four times a day (QID) | ORAL | 0 refills | Status: DC | PRN

## 2017-04-15 MED ORDER — GABAPENTIN 600 MG PO TABS: 600.0000 mg | ORAL_TABLET | Freq: Three times a day (TID) | ORAL | 2 refills | Status: DC

## 2017-04-16 NOTE — Telephone Encounter (Signed)
Zach's wife, [name], called requesting a refill for Chris's Percocet and Gabapentin. Ashlyn is the PA that has been managing these refills, however she is off on Mondays. Per Ashlyn's note, she agreed to refill these meds until his visit with the pain specialist on 2/19. After that, there will be no future refills given. [name] and Mauri are in agreement with this plan. Shona Simpson PA-C, sent in the refill as requested. [name] and [facility] plan to pick them up this afternoon.  Mont Dutton R.T.(R)(T) Special Procedures Navigator

## 2017-04-16 NOTE — Telephone Encounter (Signed)
Ashlyn Brining, PA and I were able to reach Eric Mason on 2/1. He said that he received the message from [place] on 1/31, and resumed his steroid as she suggested. He has also started taking the Protonix again which has helped with the nausea. Ine thing that Ashlyn added was an Rx for Diflucan to treat what was described by the patient, esophageal  thrush. Eric Mason was thankful for the call and plans to begin taking the Diflucan ASAP, as well as continue the steroids and Protonix.   Mont Dutton R.T. (R)(T) Special Procedures Navigator

## 2017-04-18 ENCOUNTER — Inpatient Hospital Stay: Payer: Medicaid Other | Attending: Internal Medicine | Admitting: Oncology

## 2017-04-18 VITALS — BP 160/109 | HR 98 | Temp 98.6°F | Resp 18 | Ht 70.0 in | Wt 128.0 lb

## 2017-04-18 DIAGNOSIS — C7931 Secondary malignant neoplasm of brain: Secondary | ICD-10-CM | POA: Insufficient documentation

## 2017-04-18 DIAGNOSIS — Z79899 Other long term (current) drug therapy: Secondary | ICD-10-CM | POA: Insufficient documentation

## 2017-04-18 DIAGNOSIS — R911 Solitary pulmonary nodule: Secondary | ICD-10-CM

## 2017-04-18 DIAGNOSIS — R918 Other nonspecific abnormal finding of lung field: Secondary | ICD-10-CM

## 2017-04-18 DIAGNOSIS — I2510 Atherosclerotic heart disease of native coronary artery without angina pectoris: Secondary | ICD-10-CM | POA: Insufficient documentation

## 2017-04-18 DIAGNOSIS — C801 Malignant (primary) neoplasm, unspecified: Secondary | ICD-10-CM | POA: Diagnosis not present

## 2017-04-18 DIAGNOSIS — I700 Atherosclerosis of aorta: Secondary | ICD-10-CM | POA: Diagnosis not present

## 2017-04-18 NOTE — Progress Notes (Signed)
Hematology and Oncology Follow Up Visit  Eric Mason [id number] [birth year] 47 y.o. 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  Primary tumors suspected to be from a lung neoplasm versus germ cell tumor.  Prior Therapy:   Status post craniotomy and resection in May 2018. This was followed by [facility] completed in June 2018.  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   Current therapy: Under consideration to start systemic therapy.  Interim History: Eric Mason is here for a follow-up.  He completed stereotactic radiosurgery in January 2019 after an MRI on March 25, 2017 showed 3 new intracranial metastasis.  He completed therapy without any complications and currently on dexamethasone.  He does report headaches at times but his appetite is much improved.  He did have mild nausea that has resolved and his weight is increased.  He continues to have cough that is nonproductive with occasional dyspnea on exertion.  He denies any hemoptysis or hematemesis.  He denies any pathological fractures or bone pain.  He does not report any headaches, blurry vision, syncope.  He denies any neuropathy or weakness.[name] He does not report any fevers or chills or sweats. He does not report any wheezing or hemoptysis. He is not reporting nausea, vomiting or abdominal pain. He does not report any frequency urgency or hesitancy. He does not report any skeletal complaints.  He does not report any skin rashes or lesions.  He does not report any lymphadenopathy or petechiae.  Remaining review of systems is negative.    Medications: I have reviewed the patient's current medications.  Current Outpatient Medications  Medication Sig Dispense  Refill  . albuterol (PROVENTIL HFA;VENTOLIN HFA) 108 (90 Base) MCG/ACT inhaler Inhale 2 puffs into the lungs every 6 (six) hours as needed for wheezing or shortness of breath. 1 Inhaler 6  . amitriptyline (ELAVIL) 25 MG tablet Take 25 mg by mouth at bedtime.    . B Complex-C (B-COMPLEX WITH VITAMIN C) tablet Take 1 tablet by mouth daily.    . Calcium Carb-Cholecalciferol (CALCIUM 500 +D) 500-400 MG-UNIT TABS 1 tab PO BID 60 tablet 2  . dexamethasone (DECADRON) 4 MG tablet Take 1 tablet (4 mg total) by mouth 2 (two) times daily. 40 tablet 1  . fluconazole (DIFLUCAN) 100 MG tablet Take 1 tablet (100 mg total) by mouth daily. Take 2 tablets on day 1, then one tablet daily thereafter. 8 tablet 0  . folic acid (FOLVITE) 1 MG tablet Take 1 tablet (1 mg total) by mouth daily. 100 tablet 1  . gabapentin (NEURONTIN) 600 MG tablet Take 1 tablet (600 mg total) by mouth 3 (three) times daily. 90 tablet 2  . levETIRAcetam (KEPPRA) 1000 MG tablet Take 1 tablet (1,000 mg total) by mouth 2 (two) times daily. 60 tablet 5  . Multiple Vitamin (MULTIVITAMIN) tablet Take 1 tablet by mouth daily.    . nicotine (NICODERM CQ - DOSED IN MG/24 HOURS) 21 mg/24hr patch Place 1 patch (21 mg total) onto the skin daily. (Patient not taking: Reported on 03/26/2017) 28 patch 3  . ondansetron (ZOFRAN) 8 MG tablet Take 1 tablet (8 mg total) by mouth every 8 (eight) hours as needed for nausea or vomiting. (Patient not taking: Reported on 03/26/2017) 30 tablet 0  . ondansetron (ZOFRAN)  8 MG tablet Take 1 tablet (8 mg total) by mouth every 8 (eight) hours as needed for nausea or vomiting. (Patient not taking: Reported on 03/26/2017) 30 tablet 3  . oxyCODONE-acetaminophen (PERCOCET/ROXICET) 5-325 MG tablet Take 1-2 tablets by mouth every 6 (six) hours as needed. 90 tablet 0  . pantoprazole (PROTONIX) 40 MG tablet Take 1 tablet (40 mg total) by mouth 2 (two) times daily. 60 tablet 0  . ranolazine (RANEXA) 500 MG 12 hr tablet Take 1 tablet  (500 mg total) by mouth 2 (two) times daily. 60 tablet 11  . senna-docusate (SENNA S) 8.6-50 MG tablet Take 1 tablet by mouth daily. (Patient not taking: Reported on 03/26/2017) 30 tablet 3  . thiamine (VITAMIN B-1) 100 MG tablet Take 1 tablet (100 mg total) by mouth daily. 100 tablet 0   No current facility-administered medications for this visit.      Allergies:  Allergies  Allergen Reactions  . Latex     UNSPECIFIED REACTION     Past Medical History, Surgical history, Social history, and Family History reviewed today and remain unchanged.  Physical Exam: Blood pressure (!) 160/109, pulse 98, temperature 98.6 F (37 C), temperature source Oral, resp. rate 18, height 5\' 10"  (1.778 m), weight 128 lb (58.1 kg), SpO2 100 %. ECOG: 1 General appearance: alert and cooperative appeared without distress. Head: Normocephalic, without obvious abnormality  Oropharynx: Without thrush or ulcers. Neck: no adenopathy Lymph nodes: Cervical, supraclavicular, and axillary nodes normal. Heart:regular rate and rhythm, S1, S2 normal, no murmur, click, rub or gallop Lung:chest clear, no wheezing, rales, normal symmetric air entry.  Abdomin: soft, non-tender, without masses or organomegaly no rebound or guarding. Musculoskeletal: No joint deformity or effusion.   Lab Results: Lab Results  Component Value Date   WBC 7.6 10/30/2016   HGB 11.1 (L) 10/30/2016   HCT 33.8 (L) 10/30/2016   MCV 91.5 10/30/2016   PLT 339 10/30/2016     Chemistry      Component Value Date/Time   NA 137 10/30/2016 0909   K 3.3 (L) 10/30/2016 0909   CL 103 09/25/2016 1520   CO2 27 10/30/2016 0909   BUN 4.8 (L) 10/30/2016 0909   CREATININE 0.77 03/25/2017 1445   CREATININE 0.9 10/30/2016 0909      Component Value Date/Time   CALCIUM 9.3 10/30/2016 0909   ALKPHOS 93 10/30/2016 0909   AST 35 (H) 10/30/2016 0909   ALT 25 10/30/2016 0909   BILITOT 0.41 10/30/2016 0909       IMPRESSION: 1. Technically adequate  exam showing no acute pulmonary embolus. 2. AP window lymph node appears smaller. 3. Right upper lobe and left lower lobe nodule appear unchanged. 4. New right upper lobe nodule measuring 7 mm. 5. Coronary artery disease. 6. Aortic Atherosclerosis (ICD10-I70.0) and Emphysema (ICD10-J43.9).     Impression and Plan:  47 year old gentleman with the following issues:  1. Poorly differentiated malignancy likely arising from the lung versus unknown primary.  He presented with metastatic disease to the brain.   He is status post craniotomy and resection followed by stereotactic radiosurgery in June 2018 and repeated in January 2019.  CT scan of the chest was obtained in July 2018 was reviewed again and discussed with the patient.  CT scan of the abdomen and pelvis done in October 2018 was also reviewed and discussed.  His CT scan of the chest does indicate a right upper lobe nodule that is suspicious for malignancy.  The first step is to  obtain imaging studies of the chest and potentially have a repeat biopsy.  Before considering systemic therapy clear identification of the primary tumor and possibly obtaining molecular testing would be critical at this point.  He understands after imaging studies of the chest he will require potentially a biopsy if an abnormal findings are noted.  Systemic therapy was discussed today with the patient whether be in the form of oral targeted therapy versus systemic chemotherapy.  Further discussion will take place after repeat imaging studies and possible biopsy.  2. Brain metastasis: He will continue to have follow-up MRIs for potential recurrence.  3. Follow-up: Will be in the next few weeks to follow his progress.  15  minutes was spent with the patient face-to-face today.  More than 50% of time was dedicated to patient counseling, education and coordination of his multifaceted care.    Zola Button, MD 2/7/20191:58 PM

## 2017-04-23 ENCOUNTER — Ambulatory Visit: Payer: Medicaid Other | Admitting: Internal Medicine

## 2017-04-25 ENCOUNTER — Other Ambulatory Visit: Payer: Self-pay | Admitting: Urology

## 2017-04-25 MED ORDER — OXYCODONE-ACETAMINOPHEN 5-325 MG PO TABS: 1.0000 | ORAL_TABLET | Freq: Four times a day (QID) | ORAL | 0 refills | Status: DC | PRN

## 2017-04-25 NOTE — Progress Notes (Signed)
I sent a refill for the patient's pain medication (Percocet) to the [facility].  He will be informed and also reminded that no further refills will be granted from our office as he is scheduled to see a pain management specialist on 04/30/2017.  They will take over managing his pain medications at this point.   Nicholos Johns, PA-C

## 2017-04-29 ENCOUNTER — Encounter: Payer: Self-pay | Admitting: Radiation Oncology

## 2017-04-29 ENCOUNTER — Telehealth: Payer: Self-pay | Admitting: Genetics

## 2017-04-29 ENCOUNTER — Other Ambulatory Visit: Payer: Self-pay

## 2017-04-29 ENCOUNTER — Ambulatory Visit
Admission: RE | Admit: 2017-04-29 | Discharge: 2017-04-29 | Disposition: A | Discharge status: Home / Self Care | Payer: Medicaid Other | Source: Ambulatory Visit | Attending: Radiation Oncology | Admitting: Radiation Oncology

## 2017-04-29 VITALS — BP 152/96 | HR 95 | Temp 98.0°F | Resp 18 | Ht 70.0 in | Wt 138.2 lb

## 2017-04-29 DIAGNOSIS — M25552 Pain in left hip: Secondary | ICD-10-CM | POA: Insufficient documentation

## 2017-04-29 DIAGNOSIS — Z79899 Other long term (current) drug therapy: Secondary | ICD-10-CM | POA: Insufficient documentation

## 2017-04-29 DIAGNOSIS — C7931 Secondary malignant neoplasm of brain: Secondary | ICD-10-CM | POA: Diagnosis present

## 2017-04-29 DIAGNOSIS — R609 Edema, unspecified: Secondary | ICD-10-CM | POA: Insufficient documentation

## 2017-04-29 DIAGNOSIS — R6884 Jaw pain: Secondary | ICD-10-CM | POA: Insufficient documentation

## 2017-04-29 DIAGNOSIS — K029 Dental caries, unspecified: Secondary | ICD-10-CM | POA: Diagnosis not present

## 2017-04-29 DIAGNOSIS — C801 Malignant (primary) neoplasm, unspecified: Secondary | ICD-10-CM | POA: Diagnosis not present

## 2017-04-29 DIAGNOSIS — K625 Hemorrhage of anus and rectum: Secondary | ICD-10-CM | POA: Insufficient documentation

## 2017-04-29 MED ORDER — AMOXICILLIN-POT CLAVULANATE 875-125 MG PO TABS: 1.0000 | ORAL_TABLET | Freq: Two times a day (BID) | ORAL | 1 refills | Status: DC

## 2017-04-29 NOTE — Progress Notes (Addendum)
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  Interval Since Last Radiation:  4 weeks   04/04/17 SRS Treatment: PTV2 Rt Cerebellar 64mm target received 20 Gy in a single fraction  PTV3 Rt Occipital 9mm  20 Gy in a single fraction PTV4 Lt Frontal 69mm target  20 Gy in a single fraction  08/16/16 SRS Treatment:  PTV1 Rt Hemisphere Post Operative SRS: 3 fractions of 8 Gy for a total of 24Gy     Narrative:  The patient returns today for routine follow-up.  The patient has a history of metastatic carcinoma of unknown primary. The patient was found to have a metastatic lesion in the right cerebral cortex and completed SRS to this site. He has been followed and was recently treated to three new metastatic lesions on 04/04/17. He has not been on systemic therapy but saw Dr. Alen Blew recently after multiple no shows and is going to proceed with restaging CT chest on 05/13/17 to formally re-assess his disease and to determine if he needs repeat tissue sampling. He comes today following his last SRS treatment.                        On review of systems, the patient states he continues to have left hip pain that is constant and has been bothering him for several months. He reports this wakes him up at night. He denies any injuries. He reports he is back to taking his dexamethasone, and reports he has not had any additional nausea since going back to this regimen.  This morning he started taking 2 mg and plans to take a second dose this afternoon.  We have given him written instructions on tapering each Monday until he is down to 2 mg every other day for about a week and a half.  He reports he is not having any headaches,  he denies any dyspepsia at this time and continues to take his Protonix as well.  He does report that he is taking pain medicine for his hip, and unfortunately after a death in the family has had to reschedule his pain management appointment which was tentatively scheduled for tomorrow.  His wife states that he may have to wait 4-6 additional weeks for an appointment.  He requests pain medication refills until that time.  He also states that in the last 2 days he has noticed increasing edema and pain along his left upper jaw and is concerned that he has a forming infection deep to his tooth.  He denies any discharge or bleeding from the site, but states that the area is much fuller and uncomfortable to chew.  He reports that he has not had any fevers or chills.  He denies any chest pain or shortness of breath but reports that he does continue to have a cough and from time to time notes that this is dark black looking mucus.  He states that he is also noticed one episode of bright red blood per rectum.  He reports that this has not returned, but he also states he was not constipated at the time.  ALLERGIES:  is allergic to latex.  Meds: Current Outpatient Medications  Medication Sig Dispense Refill  . albuterol (PROVENTIL HFA;VENTOLIN HFA) 108 (90 Base) MCG/ACT inhaler Inhale 2 puffs into the lungs every 6 (six) hours as needed for wheezing or shortness of breath. 1 Inhaler 6  . amitriptyline (ELAVIL) 25 MG tablet Take 25 mg by mouth at bedtime.    . B Complex-C (B-COMPLEX WITH VITAMIN C) tablet Take 1 tablet by mouth daily.    . Calcium Carb-Cholecalciferol (CALCIUM 500 +D) 500-400 MG-UNIT TABS 1 tab PO BID 60 tablet 2  . dexamethasone (DECADRON) 4 MG tablet Take 1 tablet (4 mg total) by mouth 2 (two) times daily. (Patient taking differently: Take 2 mg by mouth 2 (two) times daily. Taking 2 mg po bid starting 04-29-17) 40 tablet 1  . fluconazole (DIFLUCAN) 100 MG tablet Take 1 tablet (100 mg total) by  mouth daily. Take 2 tablets on day 1, then one tablet daily thereafter. 8 tablet 0  . folic acid (FOLVITE) 1 MG tablet Take 1 tablet (1 mg total) by mouth daily. 100 tablet 1  . gabapentin (NEURONTIN) 600 MG tablet Take 1 tablet (600 mg total) by mouth 3 (three) times daily. 90 tablet 2  . levETIRAcetam (KEPPRA) 1000 MG tablet Take 1 tablet (1,000 mg total) by mouth 2 (two) times daily. 60 tablet 5  . Multiple Vitamin (MULTIVITAMIN) tablet Take 1 tablet by mouth daily.    . nicotine (NICODERM CQ - DOSED IN MG/24 HOURS) 21 mg/24hr patch Place 1 patch (21 mg total) onto the skin daily. 28 patch 3  . oxyCODONE-acetaminophen (PERCOCET/ROXICET) 5-325 MG tablet Take 1-2 tablets by mouth every 6 (six) hours as needed. 90 tablet 0  . pantoprazole (PROTONIX) 40 MG tablet Take 1 tablet (40 mg total) by mouth 2 (two) times daily. 60 tablet 0  . ranolazine (RANEXA) 500 MG 12 hr tablet Take 1 tablet (500 mg total) by mouth 2 (two) times daily. 60 tablet 11  . thiamine (VITAMIN B-1) 100 MG tablet Take 1 tablet (100 mg total) by mouth daily. 100 tablet 0  . amoxicillin-clavulanate (AUGMENTIN) 875-125 MG tablet Take 1 tablet by mouth 2 (two) times daily. 10 tablet 1  . ondansetron (ZOFRAN) 8 MG tablet Take 1 tablet (8 mg total) by mouth every 8 (eight) hours as needed for nausea or vomiting. (Patient not taking: Reported on 03/26/2017) 30 tablet 3  . senna-docusate (SENNA S) 8.6-50 MG tablet Take 1 tablet by mouth daily. (Patient not taking: Reported on 04/29/2017) 30 tablet 3   No current facility-administered medications for this encounter.     Physical Findings:  height is 5\' 10"  (1.778 m) and weight is 138 lb 3.2 oz (62.7 kg). His oral temperature is 98 F (36.7 C). His blood pressure is 152/96 (abnormal) and his pulse is 95. His respiration is 18 and oxygen saturation is 100%.  Pain Assessment Pain Score: 7  Pain Loc: Head(Head,left side face)/10 In general this is a chronically ill-appearing  African-American male in no acute distress. He's alert and oriented x4 and appropriate throughout the examination. Cardiopulmonary assessment is negative for acute distress and he exhibits normal effort.  Inspection of the oral mucosa reveals edema of the hard palate more medial to the bicuspid tooth on the left.  The tooth itself is cracked in half.  No evidence of purulent drainage is identified but the patient is tender to light palpation over the edema.  Neurologically he appears to be otherwise intact grossly.  Lab Findings: Lab Results  Component Value Date  WBC 7.6 10/30/2016   HGB 11.1 (L) 10/30/2016   HCT 33.8 (L) 10/30/2016   MCV 91.5 10/30/2016   PLT 339 10/30/2016     Radiographic Findings: No results found.  Impression/Plan: 1. Metastatic unknown primary malignancy with disease in the brain.  Patient appears to be recovering from the effects of radiotherapy.  We discussed continued tapering of his dexamethasone and continuation of Protonix.  He is in agreement with this plan and will keep [place] informed of his progress.  He is due to have repeat imaging in 2 months time of the brain.  We will keep [place] informed of any difficulties he has with a steroid taper or questions that arise prior to that visit.  He will also follow-up with Dr. Alen Blew regarding his systemic therapy options.  He does understand that he will likely need additional tissue sampling prior to proceeding with any systemic therapy. 2. Left hip pain.  The patient has not had systemic imaging in several months, he is due to have a scan on 05/13/2017, and I have added a CT abdomen and pelvis to this order.  We will follow-up with these results when available.  In the meantime he is also [facility].  I have called and could not get through to the clinic, but contacted them on their website to try and follow-up with Dr. Phineas Douglas about seeing the patient sooner due to  his ongoing pain issues.  Patient is aware that if metastatic disease was located in the hip on his diagnostic imaging, that we would consider offering radiotherapy to this site. 3. Bright red blood per rectum and dark black mucus.  Again his CT scan will be helpful to better understand what is going on with his productive mucous and if there is any concerns within the colon however the patient does understand that this does not negate the role for colonoscopy screening.  He does have a strong family history of colon, and GI malignancies, and hence would recommend genetic counseling to rule out Lynch syndrome.  The patient is in agreement with this plan as well.  We will set him up for this, but patient does understand that if bleeding progresses or becomes noticeable again that he needs to contact us to have evaluation with GI. 4. Dental caries and concern for tooth abscess.  The patient was given a prescription for Augmentin to be taken 1 tablet p.o. twice daily for 10 days.  He is also in the process of trying to coordinate a dental appointment but is having a hard time because of Medicaid.  I will reach out to social work as well to see if there are any benefits that can be offered to him.     Carola Rhine, PAC

## 2017-04-29 NOTE — Progress Notes (Signed)
Weight and vital signs are stable. Alert and oriented x 3. Says he is forgetful at times.  Reports having a headache 7/10 taking oxycodone and left face pain from a tooth ache slight swelling since Friday. Has not seen a dentist. No visual changes.

## 2017-04-29 NOTE — Telephone Encounter (Signed)
Spoke with patient's wife to schedule genetic counseling appointment.  The appointment was scheduled for Wed Feb 27th, 2019 at 10:30 AM.

## 2017-04-30 ENCOUNTER — Telehealth: Payer: Self-pay | Admitting: General Practice

## 2017-04-30 ENCOUNTER — Telehealth: Payer: Self-pay

## 2017-04-30 NOTE — Telephone Encounter (Signed)
[facility] CSW Progress Note  Patient has an appointment on 2/20 at 9 AM w [address] [facility] [address] [facility], [address], [place], [place]).  Information given to patient and wife, asked that they call dentist to confirm appointment.  Provider takes Medicaid.  Edwyna Shell, LCSW Clinical Social Worker Phone:  [phone]

## 2017-04-30 NOTE — Telephone Encounter (Signed)
Opened in error

## 2017-04-30 NOTE — Telephone Encounter (Signed)
[place] CSW Progress Note  Referral received from PA re patient needing help w arranging dental appointment for infected tooth.  Spoke w patient's wife who states that patient has no current dental provider; however, she is calling [facility] in [place] tomorrow to see about an appointment.  CSW also spoke w CSW Bobby Rumpf at [facility] (patient's PCP) - CSW Bobby Rumpf will see if [facility] has linkage to dentist who can see patient quickly.  Wife will call CSW tomorrow progress report.  Eric Shell, LCSW Clinical Social Worker Phone:  [phone]

## 2017-05-06 ENCOUNTER — Ambulatory Visit: Payer: Self-pay | Admitting: Radiation Oncology

## 2017-05-06 ENCOUNTER — Other Ambulatory Visit: Payer: Self-pay | Admitting: Radiation Oncology

## 2017-05-06 MED ORDER — OXYCODONE-ACETAMINOPHEN 5-325 MG PO TABS: 1.0000 | ORAL_TABLET | Freq: Four times a day (QID) | ORAL | 0 refills | Status: DC | PRN

## 2017-05-07 ENCOUNTER — Encounter: Payer: Self-pay | Admitting: *Deleted

## 2017-05-08 ENCOUNTER — Encounter: Payer: Self-pay | Admitting: Genetics

## 2017-05-08 ENCOUNTER — Inpatient Hospital Stay: Payer: Medicaid Other

## 2017-05-08 ENCOUNTER — Inpatient Hospital Stay (HOSPITAL_BASED_OUTPATIENT_CLINIC_OR_DEPARTMENT_OTHER): Payer: Medicaid Other | Admitting: Genetics

## 2017-05-08 DIAGNOSIS — C763 Malignant neoplasm of pelvis: Secondary | ICD-10-CM

## 2017-05-08 DIAGNOSIS — C7931 Secondary malignant neoplasm of brain: Secondary | ICD-10-CM

## 2017-05-08 DIAGNOSIS — Z800 Family history of malignant neoplasm of digestive organs: Secondary | ICD-10-CM | POA: Insufficient documentation

## 2017-05-08 DIAGNOSIS — C801 Malignant (primary) neoplasm, unspecified: Secondary | ICD-10-CM

## 2017-05-08 DIAGNOSIS — Z8051 Family history of malignant neoplasm of kidney: Secondary | ICD-10-CM | POA: Diagnosis not present

## 2017-05-08 DIAGNOSIS — C3490 Malignant neoplasm of unspecified part of unspecified bronchus or lung: Secondary | ICD-10-CM

## 2017-05-08 DIAGNOSIS — Z803 Family history of malignant neoplasm of breast: Secondary | ICD-10-CM | POA: Insufficient documentation

## 2017-05-08 DIAGNOSIS — Z808 Family history of malignant neoplasm of other organs or systems: Secondary | ICD-10-CM | POA: Diagnosis not present

## 2017-05-08 HISTORY — DX: Family history of malignant neoplasm of other organs or systems: Z80.8

## 2017-05-08 HISTORY — DX: Family history of malignant neoplasm of kidney: Z80.51
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
## 2017-05-09 ENCOUNTER — Ambulatory Visit: Payer: Self-pay | Admitting: Radiation Oncology

## 2017-05-13 ENCOUNTER — Ambulatory Visit (HOSPITAL_COMMUNITY): Admission: RE | Admit: 2017-05-13 | Payer: Medicaid Other | Source: Ambulatory Visit

## 2017-05-13 ENCOUNTER — Encounter (HOSPITAL_COMMUNITY): Payer: Self-pay

## 2017-05-15 ENCOUNTER — Telehealth: Payer: Self-pay | Admitting: *Deleted

## 2017-05-15 ENCOUNTER — Other Ambulatory Visit: Payer: Self-pay | Admitting: Radiation Oncology

## 2017-05-15 MED ORDER — NYSTATIN 100000 UNIT/ML MT SUSP: 5.0000 mL | Freq: Four times a day (QID) | OROMUCOSAL | 0 refills | Status: AC

## 2017-05-15 NOTE — Telephone Encounter (Signed)
Returned phone call to [employer] regarding Anjelo's thrush located in his mouth and down his throat.  I informed [name] that I spoke with Bryson Ha regarding the above and that Bryson Ha will send a prescription to his pharmacy.  Cori Razor, RN

## 2017-05-16 ENCOUNTER — Ambulatory Visit (HOSPITAL_COMMUNITY): Payer: Medicaid Other

## 2017-05-17 ENCOUNTER — Encounter: Payer: Self-pay | Admitting: *Deleted

## 2017-05-17 ENCOUNTER — Other Ambulatory Visit: Payer: Self-pay | Admitting: Urology

## 2017-05-17 ENCOUNTER — Telehealth: Payer: Self-pay | Admitting: *Deleted

## 2017-05-17 DIAGNOSIS — M79604 Pain in right leg: Secondary | ICD-10-CM

## 2017-05-17 DIAGNOSIS — M79605 Pain in left leg: Principal | ICD-10-CM

## 2017-05-17 LAB — POCT ABI - SCREENING FOR PILOT NO CHARGE: Other: POSITIVE

## 2017-05-17 MED ORDER — FLUCONAZOLE 100 MG PO TABS: 100.0000 mg | ORAL_TABLET | Freq: Every day | ORAL | 0 refills | Status: DC

## 2017-05-17 NOTE — Telephone Encounter (Signed)
Returned call to Eric Mason regarding having his prescription changed from liquid to pill.  New prescription sent to [facility].  Cori Razor, RN

## 2017-05-17 NOTE — Progress Notes (Unsigned)
poc p

## 2017-05-20 ENCOUNTER — Ambulatory Visit: Payer: Medicaid Other | Admitting: Oncology

## 2017-05-20 ENCOUNTER — Telehealth: Payer: Self-pay | Admitting: Genetics

## 2017-05-20 ENCOUNTER — Other Ambulatory Visit: Payer: Self-pay | Admitting: Radiation Oncology

## 2017-05-20 ENCOUNTER — Telehealth: Payer: Self-pay | Admitting: Radiation Therapy

## 2017-05-20 MED ORDER — OXYCODONE-ACETAMINOPHEN 5-325 MG PO TABS: 1.0000 | ORAL_TABLET | Freq: Four times a day (QID) | ORAL | 0 refills | Status: DC | PRN

## 2017-05-20 NOTE — Telephone Encounter (Signed)
Eric Mason has an appointment to see the pain specialist Friday afternoon,  3/15. Eric Mason called today requesting that his pain medication be refilled through Monday because the person giving them the appointment stated that he may not get an Rx during the Friday visit. An in-basket message has been sent to Eric Simpson PA-C in reference to this request.   I have also encouraged Eric Mason to call and reschedule the Chest, Abdomen, Pelvis scans that were cancelled so his treatment team can get a picture of what is going on. She is working on that this morning and hopes to have the scans on Thursday 3/14.    Eric Mason R.T.(R)(T) Special Procedures Navigator

## 2017-05-20 NOTE — Telephone Encounter (Signed)
Revealed negative genetic testing.  Revealed that a VUS in BRCA2 and NBN was identified.   This normal result is reassuring and indicates that it is unlikely Mr. Pereyra's cancer is due to a hereditary cause.  It is unlikely that there is an increased risk of another cancer due to a mutation in one of these genes.  However, genetic testing is not perfect, and cannot definitively rule out a hereditary cause.  It will be important for him to keep in contact with genetics to learn if any additional testing may be needed in the future.      

## 2017-05-23 ENCOUNTER — Telehealth: Payer: Self-pay | Admitting: Genetics

## 2017-05-23 ENCOUNTER — Ambulatory Visit (HOSPITAL_COMMUNITY)
Admission: RE | Admit: 2017-05-23 | Discharge: 2017-05-23 | Disposition: A | Discharge status: Home / Self Care | Payer: Medicaid Other | Source: Ambulatory Visit | Attending: Oncology | Admitting: Oncology

## 2017-05-23 DIAGNOSIS — R918 Other nonspecific abnormal finding of lung field: Secondary | ICD-10-CM | POA: Diagnosis present

## 2017-05-23 DIAGNOSIS — J439 Emphysema, unspecified: Secondary | ICD-10-CM | POA: Diagnosis not present

## 2017-05-23 DIAGNOSIS — C7931 Secondary malignant neoplasm of brain: Secondary | ICD-10-CM | POA: Diagnosis present

## 2017-05-23 DIAGNOSIS — J9811 Atelectasis: Secondary | ICD-10-CM | POA: Diagnosis not present

## 2017-05-23 MED ORDER — IOPAMIDOL (ISOVUE-300) INJECTION 61%
100.0000 mL | Freq: Once | INTRAVENOUS | Status: AC | PRN
  Administered 2017-05-23: 100 mL via INTRAVENOUS

## 2017-05-23 MED ORDER — IOPAMIDOL (ISOVUE-300) INJECTION 61%
INTRAVENOUS | Status: AC
  Filled 2017-05-23: qty 30

## 2017-05-23 MED ORDER — IOPAMIDOL (ISOVUE-300) INJECTION 61%
INTRAVENOUS | Status: AC
  Filled 2017-05-23: qty 100

## 2017-05-23 MED ORDER — IOPAMIDOL (ISOVUE-300) INJECTION 61%
30.0000 mL | Freq: Once | INTRAVENOUS | Status: AC | PRN
  Administered 2017-05-23: 30 mL via ORAL

## 2017-05-23 NOTE — Telephone Encounter (Signed)
Disclosed genetic test results to wife, [name] per request.  (see previous results phone note). Will be sending summary letter in he mail as well. We are available to answer any further questions.

## 2017-05-24 ENCOUNTER — Other Ambulatory Visit: Payer: Self-pay | Admitting: Radiation Therapy

## 2017-05-24 ENCOUNTER — Encounter: Payer: Self-pay | Admitting: General Practice

## 2017-05-24 DIAGNOSIS — M5410 Radiculopathy, site unspecified: Secondary | ICD-10-CM

## 2017-05-24 NOTE — Progress Notes (Addendum)
[facility] [place] CSW Progress Notes  Referral received from Mont Dutton, radiation navigator.  Requested to call wife, [name], to explore needs in the home.  Per [name], patient is often angry, explosive, "he has never been like this before."  Does not feel her safety is in danger; however, is distressed by patient's anger directed towards her.  Patient states that his pain is significant and is impacting his behavior in the home.  [name] requests meeting w CSW to discuss issues - appt set for Weds 3/20 at 2 PM.  Edwyna Shell, [place] Worker Phone:  [phone]

## 2017-05-27 ENCOUNTER — Encounter: Payer: Self-pay | Admitting: Genetics

## 2017-05-27 ENCOUNTER — Ambulatory Visit: Payer: Self-pay | Admitting: Genetics

## 2017-05-27 DIAGNOSIS — Z803 Family history of malignant neoplasm of breast: Secondary | ICD-10-CM

## 2017-05-27 DIAGNOSIS — Z8051 Family history of malignant neoplasm of kidney: Secondary | ICD-10-CM

## 2017-05-27 DIAGNOSIS — Z808 Family history of malignant neoplasm of other organs or systems: Secondary | ICD-10-CM

## 2017-05-27 DIAGNOSIS — C3490 Malignant neoplasm of unspecified part of unspecified bronchus or lung: Secondary | ICD-10-CM

## 2017-05-27 DIAGNOSIS — C763 Malignant neoplasm of pelvis: Secondary | ICD-10-CM

## 2017-05-27 DIAGNOSIS — Z800 Family history of malignant neoplasm of digestive organs: Secondary | ICD-10-CM

## 2017-05-27 DIAGNOSIS — C801 Malignant (primary) neoplasm, unspecified: Secondary | ICD-10-CM

## 2017-05-27 DIAGNOSIS — Z1379 Encounter for other screening for genetic and chromosomal anomalies: Secondary | ICD-10-CM

## 2017-05-27 DIAGNOSIS — C7931 Secondary malignant neoplasm of brain: Secondary | ICD-10-CM

## 2017-05-27 NOTE — Progress Notes (Signed)
HPI: Mr. Foxworth was previously seen in the [place] clinic on 05/08/2017 due to a personal and family history of various cancers, and concerns regarding a hereditary predisposition to cancer. Please refer to our prior cancer genetics clinic note for more information regarding Mr. Sayler's medical, social and family histories, and our assessment and recommendations, at the time. Mr. Vantrease recent genetic test results were disclosed to him, as well as recommendations warranted by these results. These results and recommendations are discussed in more detail below.  CANCER HISTORY:   No history exists.     FAMILY HISTORY:  We obtained a detailed, 4-generation family history.  Significant diagnoses are listed below: Family History  Problem Relation Age of Onset  . Hypertension Mother   . Thyroid cancer Mother 17  . Hypertension Father   . Stomach cancer Father        mets to brain  . Renal cancer Paternal Grandmother 58  . Cancer - Other Paternal Aunt 30       cholangiocarcinoma  . Breast cancer Paternal Aunt 38  . Colon cancer Paternal Uncle   . Breast cancer Maternal Aunt   . Breast cancer Maternal Grandmother        dx >50    Mr. Quam has 4 daughters (ages 4-16) with no history of cancer.  He has 1 grandchild.  Mr. Chura has a paternal half-brother who is 35 and a maternal half-sister who is 32.  No history of cancer for these relatives.   Mr. Beckmann father died in his 15's due to cancer found in his brain.  He is unsure if this was a primary brain cancer or if it was a metastasis that originated in a different organ.  Mr. Barretto has 2 paternal aunts and 1 paternal uncle listed below: -1 paternal uncle was diagnosed with colon cancer, the age is unk.  This aunt is now >53 years of age.  She has a colectomy bag.  -1 paternal aunt recently died at the age of 45 due to cholangiocarcinoma dx in her 28's.  This aunt had 2 sons who have no history of cancer.    -1 paternal aunt was diagnosed with breast cancer in her 39's.  This aunt has no children. Mr. Clausen reports he has limited contact and information about his paternal family history.  Mr. Arboleda paternal grandfather died in his 64's with no history of cancer.  His paternal grandmother died in her 35's due to renal cancer diagnosed in her 33's.   Mr. Goulette mother has a history of thyroid cancer dx in her 59's.  She is no 61.  Mr. Cowie has 7-8 maternal aunts/uncles.  He does not know how many were aunts vs uncles.  He reports that 1 maternal aunt had breast cancer, but the age of dx is unknown.  This aunt had 2 children with no history of cancer.  Mr. Franklin is not aware of any history of cancer in any of his other maternal cousins.  Mr. Barnhard maternal grandfather died in his 52's with no history of cancer.  Mr. Adebayo maternal grandmother had breast cancer (possibly a different type of cancer) dx >50.  She died from this cancer.    Mr. Jalomo is unaware of previous family history of genetic testing for hereditary cancer risks. Patient's maternal ancestors are of African American descent, and paternal ancestors are of African American descent. There is no reported Ashkenazi Jewish ancestry. There is no known consanguinity.  GENETIC  TEST RESULTS: Genetic testing performed through [facility]'s Multi-Cancers Panel  reported out on 05/17/2017 showed no pathogenic mutations. The Multi-Cancer Panel offered by [facility] includes sequencing and/or deletion duplication testing of the following 83 genes: ALK, APC, ATM, AXIN2,BAP1,  BARD1, BLM, BMPR1A, BRCA1, BRCA2, BRIP1, CASR, CDC73, CDH1, CDK4, CDKN1B, CDKN1C, CDKN2A (p14ARF), CDKN2A (p16INK4a), CEBPA, CHEK2, CTNNA1, DICER1, DIS3L2, EGFR (c.2369C>T, p.Thr790Met variant only), EPCAM (Deletion/duplication testing only), FH, FLCN, GATA2, GPC3, GREM1 (Promoter region deletion/duplication testing only), HOXB13 (c.251G>A, p.Gly84Glu), HRAS, KIT, MAX, MEN1,  MET, MITF (c.952G>A, p.Glu318Lys variant only), MLH1, MSH2, MSH3, MSH6, MUTYH, NBN, NF1, NF2, NTHL1, PALB2, PDGFRA, PHOX2B, PMS2, POLD1, POLE, POT1, PRKAR1A, PTCH1, PTEN, RAD50, RAD51C, RAD51D, RB1, RECQL4, RET, RUNX1, SDHAF2, SDHA (sequence changes only), SDHB, SDHC, SDHD, SMAD4, SMARCA4, SMARCB1, SMARCE1, STK11, SUFU, TERC, TERT, TMEM127, TP53, TSC1, TSC2, VHL, WRN and WT1. .  A variant of uncertain significance (VUS) in a gene called BRCA2 was also noted. c.8125A>G (p.Ser2709Gly)  A variant of uncertain significance (VUS) in a gene called NBN was also noted. c.1382C>T (p.Pro461Leu)  The test report will be scanned into EPIC and will be located under the Molecular Pathology section of the Results Review tab.A portion of the result report is included below for reference.      We discussed with Mr. Mariani that because current genetic testing is not perfect, it is possible there may be a gene mutation in one of these genes that current testing cannot detect, but that chance is small. We also discussed, that there could be another gene that has not yet been discovered, or that we have not yet tested, that is responsible for the cancer diagnoses in the family. It is also possible there is a hereditary cause for the cancer in the family that Mr. Steffler did not inherit and therefore was not identified in his testing.  Therefore, it is important to remain in touch with cancer genetics in the future so that we can continue to offer Mr. Wolters the most up to date genetic testing.   Regarding the VUS's in BRCA2 and NBN: At this time, it is unknown if these variants are associated with increased cancer risk or if they are normal findings, but most variants such as these get reclassified to being inconsequential. They should not be used to make medical management decisions. With time, we suspect the lab will determine the significance of these variants, if any. If we do learn more about them, we will try to  contact Mr. Sar to discuss it further. However, it is important to stay in touch with [place] periodically and keep the address and phone number up to date.  ADDITIONAL GENETIC TESTING:We discussed with Mr. Carnell that his genetic testing was fairly extensive.  If there are are genes identified to increase cancer risk that can be analyzed in the future, we would be happy to discuss and coordinate this testing at that time.    CANCER SCREENING RECOMMENDATIONS: This negative result means that we were unable to identify a hereditary cause for his personal and family history of cancer at this time.  This result does not necessarily mean there is not a hereditary basis for his cancer.   It is still possible that there could be genetic mutations that are undetectable by current technology, or genetic mutations in genes that have not been tested or identified to increase cancer risk.  It is also possible there is a hereditary cause for the cancer in Mr. Purdy family that he did not inherit,  and therefore was not identified in his genetic testing.  Therefore, Mr. Torian was advised to continue following the cancer management and screening guidelines provided by his primary and oncology healthcare providers. Other factors such as her personal and family history may still affect his cancer risk.    RECOMMENDATIONS FOR FAMILY MEMBERS: Individuals in this family might be at some increased risk of developing cancer, over the general population risk, simply due to the family history of cancer. We recommended women in this family have a yearly mammogram beginning at age 33, or 30 years younger than the earliest onset of cancer, an annual clinical breast exam, and perform monthly breast self-exams. Women in this family should also have a gynecological exam as recommended by their primary provider. All family members should have a colonoscopy by age 3.  All family members should inform their physicians about the  family history of cancer so their doctors can make the most appropriate screening recommendations for them.   Based on Mr. Urbani family history, we recommended paternal relatives discuss genetic testing with their doctors as they may also meet medical criteria for genetic testing.   Mr. Human will let us know if we can be of any assistance in coordinating genetic counseling and/or testing for *these family members.   FOLLOW-UP: Lastly, we discussed with Mr. Pitcock that cancer genetics is a rapidly advancing field and it is possible that new genetic tests will be appropriate for him and/or his family members in the future. We encouraged him to remain in contact with cancer genetics on an annual basis so we can update his personal and family histories and let him know of advances in cancer genetics that may benefit this family.   Our contact number was provided. Mr. Pindell questions were answered to his satisfaction, and he knows he is welcome to call us at anytime with additional questions or concerns.   Ferol Luz, MS, [facility] Certified Genetic Counselor [email or web address]

## 2017-05-28 ENCOUNTER — Other Ambulatory Visit: Payer: Self-pay | Admitting: Radiation Therapy

## 2017-05-28 ENCOUNTER — Encounter: Payer: Self-pay | Admitting: Radiation Oncology

## 2017-05-28 DIAGNOSIS — C7949 Secondary malignant neoplasm of other parts of nervous system: Principal | ICD-10-CM

## 2017-05-28 DIAGNOSIS — C7931 Secondary malignant neoplasm of brain: Secondary | ICD-10-CM
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
## 2017-05-29 ENCOUNTER — Telehealth: Payer: Self-pay | Admitting: General Practice

## 2017-05-29 NOTE — Telephone Encounter (Signed)
[facility] CSW Progress Note  Wife no show for scheduled appointment w CSW.  Edwyna Shell, LCSW Clinical Social Worker Phone:  [phone]

## 2017-05-30 ENCOUNTER — Ambulatory Visit (HOSPITAL_COMMUNITY): Payer: Medicaid Other

## 2017-05-30 ENCOUNTER — Telehealth: Payer: Self-pay | Admitting: Oncology

## 2017-05-30 ENCOUNTER — Ambulatory Visit (HOSPITAL_COMMUNITY): Admission: RE | Admit: 2017-05-30 | Payer: Medicaid Other | Source: Ambulatory Visit

## 2017-05-30 NOTE — Telephone Encounter (Signed)
Scheduled appt per 3/20 sch message - left message with appt date and time.

## 2017-06-01 ENCOUNTER — Ambulatory Visit (HOSPITAL_COMMUNITY)
Admission: RE | Admit: 2017-06-01 | Discharge: 2017-06-01 | Disposition: A | Discharge status: Home / Self Care | Payer: Medicaid Other | Source: Ambulatory Visit | Attending: Radiation Oncology | Admitting: Radiation Oncology

## 2017-06-01 DIAGNOSIS — M5136 Other intervertebral disc degeneration, lumbar region: Secondary | ICD-10-CM | POA: Insufficient documentation

## 2017-06-01 DIAGNOSIS — M5410 Radiculopathy, site unspecified: Secondary | ICD-10-CM

## 2017-06-01 DIAGNOSIS — M488X6 Other specified spondylopathies, lumbar region: Secondary | ICD-10-CM | POA: Diagnosis not present

## 2017-06-01 MED ORDER — GADOBENATE DIMEGLUMINE 529 MG/ML IV SOLN
15.0000 mL | Freq: Once | INTRAVENOUS | Status: AC | PRN
  Administered 2017-06-01: 13 mL via INTRAVENOUS

## 2017-06-03 ENCOUNTER — Inpatient Hospital Stay: Payer: Medicaid Other | Attending: Internal Medicine | Admitting: Oncology

## 2017-06-03 ENCOUNTER — Telehealth: Payer: Self-pay

## 2017-06-03 VITALS — BP 119/91 | HR 97 | Temp 99.3°F | Resp 20 | Ht 70.0 in | Wt 139.1 lb

## 2017-06-03 DIAGNOSIS — C3490 Malignant neoplasm of unspecified part of unspecified bronchus or lung: Secondary | ICD-10-CM | POA: Insufficient documentation

## 2017-06-03 DIAGNOSIS — Z79899 Other long term (current) drug therapy: Secondary | ICD-10-CM | POA: Diagnosis not present

## 2017-06-03 DIAGNOSIS — C7951 Secondary malignant neoplasm of bone: Secondary | ICD-10-CM | POA: Insufficient documentation

## 2017-06-03 DIAGNOSIS — Z7951 Long term (current) use of inhaled steroids: Secondary | ICD-10-CM

## 2017-06-03 DIAGNOSIS — C7931 Secondary malignant neoplasm of brain: Secondary | ICD-10-CM | POA: Insufficient documentation

## 2017-06-03 DIAGNOSIS — Z7189 Other specified counseling: Secondary | ICD-10-CM

## 2017-06-03 LAB — POCT I-STAT CREATININE: Creatinine, Ser: 0.8 mg/dL (ref 0.61–1.24)

## 2017-06-03 MED ORDER — LIDOCAINE-PRILOCAINE 2.5-2.5 % EX CREA: 1.0000 "application " | TOPICAL_CREAM | CUTANEOUS | 0 refills | Status: DC | PRN

## 2017-06-03 MED ORDER — PROCHLORPERAZINE MALEATE 10 MG PO TABS: 10.0000 mg | ORAL_TABLET | Freq: Four times a day (QID) | ORAL | 0 refills | Status: DC | PRN

## 2017-06-03 MED ORDER — TRAMADOL HCL 50 MG PO TABS: 50.0000 mg | ORAL_TABLET | Freq: Four times a day (QID) | ORAL | 0 refills | Status: DC | PRN

## 2017-06-03 MED FILL — LIDOCAINE-PRILOCAINE CREAM: 2.5-2.5 | 10 days supply | Qty: 30 | Fill #0

## 2017-06-03 MED FILL — PROCHLORPERAZINE 10 MG TAB: 10 | 7 days supply | Qty: 30 | Fill #0

## 2017-06-03 MED FILL — traMADol HCL 50 MG TABS: 50 | 22 days supply | Qty: 90 | Fill #0

## 2017-06-03 NOTE — Progress Notes (Signed)
START ON PATHWAY REGIMEN - Non-Small Cell Lung     A cycle is every 21 days:     Pembrolizumab      Paclitaxel      Carboplatin   **Always confirm dose/schedule in your pharmacy ordering system**    Patient Characteristics: Stage IV Metastatic, Squamous, PS = 0, 1, First Line, PD-L1 Expression Positive 1-49% (TPS) / Negative / Not Tested / Awaiting Test Results AJCC T Category: TX Current Disease Status: Distant Metastases AJCC N Category: NX AJCC M Category: M1c AJCC 8 Stage Grouping: IVB Histology: Squamous Cell Line of therapy: First Line PD-L1 Expression Status: Quantity Not Sufficient Performance Status: PS = 0, 1 Would you be surprised if this patient died  in the next year<= I would be surprised if this patient died in the next year Intent of Therapy: Non-Curative / Palliative Intent, Discussed with Patient

## 2017-06-03 NOTE — Progress Notes (Signed)
Hematology and Oncology Follow Up Visit  Eric Mason [id number] Aug 03, 1970 47 y.o. 06/03/2017 10:09 AM Eric Mason, [name], Eric Batman, MD   Principle Diagnosis: 47 year old with advanced malignancy arising from the lung.  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  He was initially diagnosed in May 2018.    Prior Therapy:   Status post craniotomy and resection in May 2018. This was followed by [name] completed in June 2018.  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   Current therapy: Under consideration to start systemic therapy.  Interim History: Eric Mason presents today for a follow-up.  Since the last visit, he has been reporting lower back pain and his evaluation included an MRI of the spine which showed L5 mass in the foraminal and extraforaminal zone measuring 19 mm x 26 mm.  He is under evaluation for possible radiation therapy in the future.  He does report his pain is rather sharp and radiate down his leg on the left side.  He denied any neurological deficits.  He denies any weakness or neuropathy.  He denied any change in his bowel or urinary habits.  His performance status remain adequate he is able to eat well and maintaining his weight.  He denies any nausea, vomiting or abdominal pain.  He denies any headaches or seizures.  He is able to attend to most activities of daily living  He does not report any any fevers or chills or sweats.  He denies any cough, wheezing or hemoptysis.  He denies any chest pain, palpitation, orthopnea or leg edema. He is not reporting nausea, vomiting or abdominal pain. He does not report any frequency urgency or hesitancy.  He denies any pathological fractures or bone pain.  He does not report any skin rashes or lesions.  He does not report any lymphadenopathy or  petechiae.  He denies any heat or cold intolerance.  He denies any anxiety or depression.  Remaining review of systems is negative.    Medications: I have reviewed the patient's current medications.  Current Outpatient Medications  Medication Sig Dispense Refill  . albuterol (PROVENTIL HFA;VENTOLIN HFA) 108 (90 Base) MCG/ACT inhaler Inhale 2 puffs into the lungs every 6 (six) hours as needed for wheezing or shortness of breath. 1 Inhaler 6  . amitriptyline (ELAVIL) 25 MG tablet Take 25 mg by mouth at bedtime.    [name] amoxicillin-clavulanate (AUGMENTIN) 875-125 MG tablet Take 1 tablet by mouth 2 (two) times daily. 10 tablet 1  . B Complex-C (B-COMPLEX WITH VITAMIN C) tablet Take 1 tablet by mouth daily.    . Calcium Carb-Cholecalciferol (CALCIUM 500 +D) 500-400 MG-UNIT TABS 1 tab PO BID 60 tablet 2  . dexamethasone (DECADRON) 4 MG tablet Take 1 tablet (4 mg total) by mouth 2 (two) times daily. (Patient taking differently: Take 2 mg by mouth 2 (two) times daily. Taking 2 mg po bid starting 04-29-17) 40 tablet 1  . fluconazole (DIFLUCAN) 100 MG tablet Take 1 tablet (100 mg total) by mouth daily. Take 2 tablets on day 1, then one tablet daily thereafter. 8 tablet 0  . folic acid (FOLVITE) 1 MG tablet Take 1 tablet (1 mg total) by mouth daily. 100 tablet 1  . gabapentin (NEURONTIN) 600 MG tablet Take 1 tablet (600 mg total) by mouth 3 (three) times daily. 90 tablet 2  .  levETIRAcetam (KEPPRA) 1000 MG tablet Take 1 tablet (1,000 mg total) by mouth 2 (two) times daily. 60 tablet 5  . lidocaine-prilocaine (EMLA) cream Apply 1 application topically as needed. 30 g 0  . Multiple Vitamin (MULTIVITAMIN) tablet Take 1 tablet by mouth daily.    . nicotine (NICODERM CQ - DOSED IN MG/24 HOURS) 21 mg/24hr patch Place 1 patch (21 mg total) onto the skin daily. 28 patch 3  . ondansetron (ZOFRAN) 8 MG tablet Take 1 tablet (8 mg total) by mouth every 8 (eight) hours as needed for nausea or vomiting. (Patient not taking:  Reported on 03/26/2017) 30 tablet 3  . oxyCODONE-acetaminophen (PERCOCET/ROXICET) 5-325 MG tablet Take 1-2 tablets by mouth every 6 (six) hours as needed. 24 tablet 0  . pantoprazole (PROTONIX) 40 MG tablet Take 1 tablet (40 mg total) by mouth 2 (two) times daily. 60 tablet 0  . prochlorperazine (COMPAZINE) 10 MG tablet Take 1 tablet (10 mg total) by mouth every 6 (six) hours as needed for nausea or vomiting. 30 tablet 0  . ranolazine (RANEXA) 500 MG 12 hr tablet Take 1 tablet (500 mg total) by mouth 2 (two) times daily. 60 tablet 11  . senna-docusate (SENNA S) 8.6-50 MG tablet Take 1 tablet by mouth daily. (Patient not taking: Reported on 04/29/2017) 30 tablet 3  . thiamine (VITAMIN B-1) 100 MG tablet Take 1 tablet (100 mg total) by mouth daily. 100 tablet 0  . traMADol (ULTRAM) 50 MG tablet Take 1 tablet (50 mg total) by mouth every 6 (six) hours as needed. 90 tablet 0   No current facility-administered medications for this visit.      Allergies:  Allergies  Allergen Reactions  . Latex     UNSPECIFIED REACTION     Past Medical History, Surgical history, Social history, and Family History reviewed today and remain unchanged.  Physical Exam: Blood pressure (!) 119/91, pulse 97, temperature 99.3 F (37.4 C), temperature source Oral, resp. rate 20, height 5\' 10"  (1.778 m), weight 139 lb 1.6 oz (63.1 kg), SpO2 100 %. ECOG: 1 General appearance: Comfortable appearing gentleman without distress. Head: Atraumatic without abnormalities. Oropharynx: Mucous membranes are moist and pink without thrush. Eyes: No scleral icterus. Lymph nodes: Cervical, supraclavicular, and axillary nodes normal. Heart: Regular rate and rhythm without any murmurs rubs or gallops. Lung:clear to auscultation without any rhonchi wheezes or dullness to percussion. Abdomin: Soft, nontender without any rebound or guarding. Musculoskeletal: No joint deformity or effusion.  Full range of motion noted in his hip flexors  bilaterally. Neurological:  no motor or sensory deficits.   Lab Results: Lab Results  Component Value Date   WBC 7.6 10/30/2016   HGB 11.1 (L) 10/30/2016   HCT 33.8 (L) 10/30/2016   MCV 91.5 10/30/2016   PLT 339 10/30/2016     Chemistry      Component Value Date/Time   NA 137 10/30/2016 0909   K 3.3 (L) 10/30/2016 0909   CL 103 09/25/2016 1520   CO2 27 10/30/2016 0909   BUN 4.8 (L) 10/30/2016 0909   CREATININE 0.77 03/25/2017 1445   CREATININE 0.9 10/30/2016 0909      Component Value Date/Time   CALCIUM 9.3 10/30/2016 0909   ALKPHOS 93 10/30/2016 0909   AST 35 (H) 10/30/2016 0909   ALT 25 10/30/2016 0909   BILITOT 0.41 10/30/2016 0909      IMPRESSION: 1. Similar appearance bilateral pulmonary nodules. 2. Interval increase in architectural distortion and atelectasis in the right middle  lobe. Continued attention on follow-up suggested. 3.  Emphysema. (ICD10-J43.9) 4. No lymphadenopathy in the abdomen or pelvis.     Impression and Plan:  47 year old gentleman with the following issues:  1.  Non-small cell lung cancer presented with poorly differentiated malignancy with metastatic disease to the brain as well as the spine.  His CT scan obtained on 05/23/2017 was personally reviewed today and treatment options were discussed.  Given the continuous recurrence of his disease systemic therapy is indicated at this time.  It would be ideal to rebiopsy any of these lesions for further differentiation and molecular testing.  But given the size and the location it would be unlikely to obtain adequate tissue biopsy at this time.  Given these findings, I will proceed with systemic chemotherapy and treat him as metastatic squamous cell carcinoma arising from the lung.  The rationale and risks and benefits of using carboplatin and Taxol chemotherapy with Beryle Flock was reviewed.  These complications include nausea, vomiting, myelosuppression, neutropenia, neutropenic sepsis,  peripheral neuropathy among others.  Complication associated with immunotherapy include fatigue, pruritus, pneumonitis and rarely colitis.  After discussion today he is agreeable to proceed at this time after chemotherapy education class.  2.  CNS and spinal metastasis: He continues to follow with radiation oncology for surveillance.  He will likely require additional treatment to his lumbar spine lesion.  3.  IV access: Risks and benefits of Port-A-Cath insertion was discussed.  These complications include bleeding, infection and thrombosis.  He is agreeable to proceed and EMLA cream was made available to the patient.  4.  Antiemetics: Prescription for Compazine was made available to the patient  5.  Pain: He has been receiving narcotic pain medication from multiple providers and currently following at the pain clinic.  I gave him a prescription for Ultram until he is established care with his pain clinic.  6.  Baseline kidney function: His creatinine on March 25, 2017 was 0.7 with a creatinine clearance of only 60 cc/min.  7.  Prognosis and goals of care: He remains in good health reasonably active with good performance status.  His disease appears to be incurable and treatment is palliative however.  8. Follow-up: Will be in the next few weeks to start first cycle of chemotherapy.  25  minutes was spent with the patient face-to-face today.  More than 50% of time was dedicated to patient counseling, education and answering question regarding his treatment plan.    Zola Button, MD 3/25/201910:09 AM

## 2017-06-03 NOTE — Telephone Encounter (Signed)
Printed avs and calender of upcoming appointment. Per 3/25 los. Also added a Chemo edu. class

## 2017-06-06 ENCOUNTER — Telehealth: Payer: Self-pay | Admitting: *Deleted

## 2017-06-06 ENCOUNTER — Other Ambulatory Visit: Payer: Medicaid Other

## 2017-06-07 ENCOUNTER — Telehealth: Payer: Self-pay | Admitting: *Deleted

## 2017-06-10 ENCOUNTER — Inpatient Hospital Stay: Admission: RE | Admit: 2017-06-10 | Payer: Medicaid Other | Source: Ambulatory Visit

## 2017-06-10 ENCOUNTER — Other Ambulatory Visit: Payer: Medicaid Other

## 2017-06-11 ENCOUNTER — Other Ambulatory Visit: Payer: Self-pay | Admitting: Radiology

## 2017-06-12 ENCOUNTER — Ambulatory Visit (HOSPITAL_COMMUNITY)
Admission: RE | Admit: 2017-06-12 | Discharge: 2017-06-12 | Disposition: A | Discharge status: Home / Self Care | Payer: Medicaid Other | Source: Ambulatory Visit | Attending: Oncology | Admitting: Oncology

## 2017-06-12 ENCOUNTER — Other Ambulatory Visit: Payer: Self-pay | Admitting: Oncology

## 2017-06-12 ENCOUNTER — Encounter (HOSPITAL_COMMUNITY): Payer: Self-pay

## 2017-06-12 ENCOUNTER — Ambulatory Visit: Payer: Medicaid Other | Admitting: Radiation Oncology

## 2017-06-12 DIAGNOSIS — Z79899 Other long term (current) drug therapy: Secondary | ICD-10-CM | POA: Diagnosis not present

## 2017-06-12 DIAGNOSIS — Z800 Family history of malignant neoplasm of digestive organs: Secondary | ICD-10-CM | POA: Diagnosis not present

## 2017-06-12 DIAGNOSIS — F17200 Nicotine dependence, unspecified, uncomplicated: Secondary | ICD-10-CM | POA: Insufficient documentation

## 2017-06-12 DIAGNOSIS — C3491 Malignant neoplasm of unspecified part of right bronchus or lung: Secondary | ICD-10-CM | POA: Insufficient documentation

## 2017-06-12 DIAGNOSIS — Z803 Family history of malignant neoplasm of breast: Secondary | ICD-10-CM | POA: Diagnosis not present

## 2017-06-12 DIAGNOSIS — Z9889 Other specified postprocedural states: Secondary | ICD-10-CM | POA: Insufficient documentation

## 2017-06-12 DIAGNOSIS — Z808 Family history of malignant neoplasm of other organs or systems: Secondary | ICD-10-CM | POA: Insufficient documentation

## 2017-06-12 DIAGNOSIS — C3490 Malignant neoplasm of unspecified part of unspecified bronchus or lung: Secondary | ICD-10-CM

## 2017-06-12 DIAGNOSIS — Z8051 Family history of malignant neoplasm of kidney: Secondary | ICD-10-CM | POA: Insufficient documentation

## 2017-06-12 HISTORY — PX: IR US GUIDE VASC ACCESS RIGHT: IMG2390

## 2017-06-12 HISTORY — PX: IR FLUORO GUIDE PORT INSERTION RIGHT: IMG5741

## 2017-06-12 LAB — CBC
HCT: 33.4 % — ABNORMAL LOW (ref 39.0–52.0)
Hemoglobin: 11.2 g/dL — ABNORMAL LOW (ref 13.0–17.0)
MCH: 27.9 pg (ref 26.0–34.0)
MCHC: 33.5 g/dL (ref 30.0–36.0)
MCV: 83.1 fL (ref 78.0–100.0)
PLATELETS: 554 10*3/uL — AB (ref 150–400)
RBC: 4.02 MIL/uL — AB (ref 4.22–5.81)
RDW: 15.5 % (ref 11.5–15.5)
WBC: 7.1 10*3/uL (ref 4.0–10.5)

## 2017-06-12 LAB — APTT: aPTT: 33 seconds (ref 24–36)

## 2017-06-12 LAB — PROTIME-INR
INR: 1
PROTHROMBIN TIME: 13.1 s (ref 11.4–15.2)

## 2017-06-12 MED ORDER — HEPARIN SOD (PORK) LOCK FLUSH 100 UNIT/ML IV SOLN
INTRAVENOUS | Status: AC | PRN
  Administered 2017-06-12: 500 [IU] via INTRAVENOUS

## 2017-06-12 MED ORDER — SODIUM CHLORIDE 0.9 % IV SOLN
INTRAVENOUS | Status: DC
  Administered 2017-06-12: 12:00:00 via INTRAVENOUS

## 2017-06-12 MED ORDER — LIDOCAINE HCL 1 % IJ SOLN
INTRAMUSCULAR | Status: AC
  Filled 2017-06-12: qty 20

## 2017-06-12 MED ORDER — LIDOCAINE HCL (PF) 1 % IJ SOLN
INTRAMUSCULAR | Status: AC | PRN
  Administered 2017-06-12: 5 mL

## 2017-06-12 MED ORDER — HEPARIN SOD (PORK) LOCK FLUSH 100 UNIT/ML IV SOLN
INTRAVENOUS | Status: AC
  Filled 2017-06-12: qty 5

## 2017-06-12 MED ORDER — FENTANYL CITRATE (PF) 100 MCG/2ML IJ SOLN
INTRAMUSCULAR | Status: AC
  Filled 2017-06-12: qty 2

## 2017-06-12 MED ORDER — CEFAZOLIN SODIUM-DEXTROSE 2-4 GM/100ML-% IV SOLN
2.0000 g | Freq: Once | INTRAVENOUS | Status: DC
  Filled 2017-06-12: qty 100

## 2017-06-12 MED ORDER — MIDAZOLAM HCL 2 MG/2ML IJ SOLN
INTRAMUSCULAR | Status: AC
  Filled 2017-06-12: qty 4

## 2017-06-12 MED ORDER — MIDAZOLAM HCL 2 MG/2ML IJ SOLN
INTRAMUSCULAR | Status: AC | PRN
  Administered 2017-06-12: 2 mg via INTRAVENOUS

## 2017-06-12 MED ORDER — FENTANYL CITRATE (PF) 100 MCG/2ML IJ SOLN
INTRAMUSCULAR | Status: AC | PRN
  Administered 2017-06-12 (×2): 50 ug via INTRAVENOUS

## 2017-06-12 NOTE — Procedures (Signed)
Interventional Radiology Procedure Note  Procedure: Single Lumen Power Port Placement    Access:  Right IJ vein.  Findings: Catheter tip positioned at SVC/RA junction. Port is ready for immediate use.   Complications: None  EBL: < 10 mL  Recommendations:  - Ok to shower in 24 hours - Do not submerge for 7 days - Routine line care   Emanuell Morina T. Sindee Stucker, M.D Pager:  [phone]   

## 2017-06-12 NOTE — Discharge Instructions (Signed)
Implanted Port Insertion, Care After °This sheet gives you information about how to care for yourself after your procedure. Your health care provider may also give you more specific instructions. If you have problems or questions, contact your health care provider. °What can I expect after the procedure? °After your procedure, it is common to have: °· Discomfort at the port insertion site. °· Bruising on the skin over the port. This should improve over 3-4 days. ° °Follow these instructions at home: °Port care °· After your port is placed, you will get a manufacturer's information card. The card has information about your port. Keep this card with you at all times. °· Take care of the port as told by your health care provider. Ask your health care provider if you or a family member can get training for taking care of the port at home. A home health care nurse may also take care of the port. °· Make sure to remember what type of port you have. °Incision care °· Follow instructions from your health care provider about how to take care of your port insertion site. Make sure you: °? Wash your hands with soap and water before you change your bandage (dressing). If soap and water are not available, use hand sanitizer. °? Change your dressing as told by your health care provider. °? Leave stitches (sutures), skin glue, or adhesive strips in place. These skin closures may need to stay in place for 2 weeks or longer. If adhesive strip edges start to loosen and curl up, you may trim the loose edges. Do not remove adhesive strips completely unless your health care provider tells you to do that. °· Check your port insertion site every day for signs of infection. Check for: °? More redness, swelling, or pain. °? More fluid or blood. °? Warmth. °? Pus or a bad smell. °General instructions °· Do not take baths, swim, or use a hot tub until your health care provider approves. °· Do not lift anything that is heavier than 10 lb (4.5  kg) for a week, or as told by your health care provider. °· Ask your health care provider when it is okay to: °? Return to work or school. °? Resume usual physical activities or sports. °· Do not drive for 24 hours if you were given a medicine to help you relax (sedative). °· Take over-the-counter and prescription medicines only as told by your health care provider. °· Wear a medical alert bracelet in case of an emergency. This will tell any health care providers that you have a port. °· Keep all follow-up visits as told by your health care provider. This is important. °Contact a health care provider if: °· You cannot flush your port with saline as directed, or you cannot draw blood from the port. °· You have a fever or chills. °· You have more redness, swelling, or pain around your port insertion site. °· You have more fluid or blood coming from your port insertion site. °· Your port insertion site feels warm to the touch. °· You have pus or a bad smell coming from the port insertion site. °Get help right away if: °· You have chest pain or shortness of breath. °· You have bleeding from your port that you cannot control. °Summary °· Take care of the port as told by your health care provider. °· Change your dressing as told by your health care provider. °· Keep all follow-up visits as told by your health care provider. °  This information is not intended to replace advice given to you by your health care provider. Make sure you discuss any questions you have with your health care provider. °Document Released: 12/17/2012 Document Revised: 01/18/2016 Document Reviewed: 01/18/2016 °Elsevier Interactive Patient Education © 2017 [employer] °Moderate Conscious Sedation, Adult, Care After °These instructions provide you with information about caring for yourself after your procedure. Your health care provider may also give you more specific instructions. Your treatment has been planned according to current medical  practices, but problems sometimes occur. Call your health care provider if you have any problems or questions after your procedure. °What can I expect after the procedure? °After your procedure, it is common: °· To feel sleepy for several hours. °· To feel clumsy and have poor balance for several hours. °· To have poor judgment for several hours. °· To vomit if you eat too soon. ° °Follow these instructions at home: °For at least 24 hours after the procedure: ° °· Do not: °? Participate in activities where you could fall or become injured. °? Drive. °? Use heavy machinery. °? Drink alcohol. °? Take sleeping pills or medicines that cause drowsiness. °? Make important decisions or sign legal documents. °? Take care of children on your own. °· Rest. °Eating and drinking °· Follow the diet recommended by your health care provider. °· If you vomit: °? Drink water, juice, or soup when you can drink without vomiting. °? Make sure you have little or no nausea before eating solid foods. °General instructions °· Have a responsible adult stay with you until you are awake and alert. °· Take over-the-counter and prescription medicines only as told by your health care provider. °· If you smoke, do not smoke without supervision. °· Keep all follow-up visits as told by your health care provider. This is important. °Contact a health care provider if: °· You keep feeling nauseous or you keep vomiting. °· You feel light-headed. °· You develop a rash. °· You have a fever. °Get help right away if: °· You have trouble breathing. °This information is not intended to replace advice given to you by your health care provider. Make sure you discuss any questions you have with your health care provider. °Document Released: 12/17/2012 Document Revised: 08/01/2015 Document Reviewed: 06/18/2015 °[employer]vier Interactive Patient Education © 2018 [employer] ° °
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
## 2017-06-13 ENCOUNTER — Other Ambulatory Visit: Payer: Medicaid Other

## 2017-06-18 ENCOUNTER — Telehealth: Payer: Self-pay | Admitting: *Deleted

## 2017-06-18 ENCOUNTER — Inpatient Hospital Stay: Payer: Medicaid Other | Attending: Internal Medicine

## 2017-06-18 ENCOUNTER — Inpatient Hospital Stay: Payer: Medicaid Other

## 2017-06-18 ENCOUNTER — Encounter: Payer: Self-pay | Admitting: *Deleted

## 2017-06-18 VITALS — BP 110/82 | HR 75 | Temp 98.9°F | Resp 18 | Wt 138.5 lb

## 2017-06-18 DIAGNOSIS — C3490 Malignant neoplasm of unspecified part of unspecified bronchus or lung: Secondary | ICD-10-CM | POA: Insufficient documentation

## 2017-06-18 DIAGNOSIS — Z95828 Presence of other vascular implants and grafts: Secondary | ICD-10-CM

## 2017-06-18 DIAGNOSIS — C7951 Secondary malignant neoplasm of bone: Secondary | ICD-10-CM | POA: Insufficient documentation

## 2017-06-18 DIAGNOSIS — G893 Neoplasm related pain (acute) (chronic): Secondary | ICD-10-CM | POA: Diagnosis not present

## 2017-06-18 DIAGNOSIS — Z79899 Other long term (current) drug therapy: Secondary | ICD-10-CM | POA: Insufficient documentation

## 2017-06-18 DIAGNOSIS — C7931 Secondary malignant neoplasm of brain: Secondary | ICD-10-CM | POA: Diagnosis not present

## 2017-06-18 DIAGNOSIS — Z5111 Encounter for antineoplastic chemotherapy: Secondary | ICD-10-CM | POA: Diagnosis present

## 2017-06-18 LAB — CMP (CANCER CENTER ONLY)
ALT: 9 U/L (ref 0–55)
ANION GAP: 10 (ref 3–11)
AST: 18 U/L (ref 5–34)
Albumin: 2.8 g/dL — ABNORMAL LOW (ref 3.5–5.0)
Alkaline Phosphatase: 90 U/L (ref 40–150)
BILIRUBIN TOTAL: 0.2 mg/dL (ref 0.2–1.2)
BUN: 3 mg/dL — ABNORMAL LOW (ref 7–26)
CHLORIDE: 107 mmol/L (ref 98–109)
CO2: 25 mmol/L (ref 22–29)
Calcium: 9.1 mg/dL (ref 8.4–10.4)
Creatinine: 0.78 mg/dL (ref 0.70–1.30)
GFR, Est AFR Am: >60 mL/min (ref >60)
GFR, Estimated: >60 mL/min (ref >60)
Glucose, Bld: 93 mg/dL (ref 70–140)
POTASSIUM: 3.7 mmol/L (ref 3.5–5.1)
SODIUM: 142 mmol/L (ref 136–145)
TOTAL PROTEIN: 7.2 g/dL (ref 6.4–8.3)

## 2017-06-18 LAB — CBC WITH DIFFERENTIAL (CANCER CENTER ONLY)
BASOS PCT: 0 %
Basophils Absolute: 0 10*3/uL (ref 0.0–0.1)
EOS ABS: 0.1 10*3/uL (ref 0.0–0.5)
EOS PCT: 1 %
HEMATOCRIT: 31.4 % — AB (ref 38.4–49.9)
Hemoglobin: 10.2 g/dL — ABNORMAL LOW (ref 13.0–17.1)
Lymphocytes Relative: 52 %
Lymphs Abs: 4.1 10*3/uL — ABNORMAL HIGH (ref 0.9–3.3)
MCH: 27.3 pg (ref 27.2–33.4)
MCHC: 32.5 g/dL (ref 32.0–36.0)
MCV: 84.2 fL (ref 79.3–98.0)
MONO ABS: 0.7 10*3/uL (ref 0.1–0.9)
MONOS PCT: 10 %
Neutro Abs: 2.9 10*3/uL (ref 1.5–6.5)
Neutrophils Relative %: 37 %
PLATELETS: 514 10*3/uL — AB (ref 140–400)
RBC: 3.73 MIL/uL — ABNORMAL LOW (ref 4.20–5.82)
RDW: 16.3 % — AB (ref 11.0–14.6)
WBC Count: 7.8 10*3/uL (ref 4.0–10.3)

## 2017-06-18 MED ORDER — SODIUM CHLORIDE 0.9 % IV SOLN
175.0000 mg/m2 | Freq: Once | INTRAVENOUS | Status: AC
  Administered 2017-06-18: 312 mg via INTRAVENOUS
  Filled 2017-06-18: qty 52

## 2017-06-18 MED ORDER — FAMOTIDINE IN NACL 20-0.9 MG/50ML-% IV SOLN
INTRAVENOUS | Status: AC
Start: 2017-06-18 — End: ?
  Filled 2017-06-18: qty 50

## 2017-06-18 MED ORDER — SODIUM CHLORIDE 0.9 % IV SOLN
550.0000 mg | Freq: Once | INTRAVENOUS | Status: AC
  Administered 2017-06-18: 550 mg via INTRAVENOUS
  Filled 2017-06-18: qty 55

## 2017-06-18 MED ORDER — DIPHENHYDRAMINE HCL 50 MG/ML IJ SOLN
50.0000 mg | Freq: Once | INTRAMUSCULAR | Status: AC
  Administered 2017-06-18: 50 mg via INTRAVENOUS

## 2017-06-18 MED ORDER — DIPHENHYDRAMINE HCL 50 MG/ML IJ SOLN
INTRAMUSCULAR | Status: AC
  Filled 2017-06-18: qty 1

## 2017-06-18 MED ORDER — PALONOSETRON HCL INJECTION 0.25 MG/5ML
INTRAVENOUS | Status: AC
  Filled 2017-06-18: qty 5

## 2017-06-18 MED ORDER — FAMOTIDINE IN NACL 20-0.9 MG/50ML-% IV SOLN
20.0000 mg | Freq: Once | INTRAVENOUS | Status: AC
  Administered 2017-06-18: 20 mg via INTRAVENOUS

## 2017-06-18 MED ORDER — PALONOSETRON HCL INJECTION 0.25 MG/5ML
0.2500 mg | Freq: Once | INTRAVENOUS | Status: AC
  Administered 2017-06-18: 0.25 mg via INTRAVENOUS

## 2017-06-18 MED ORDER — SODIUM CHLORIDE 0.9% FLUSH
10.0000 mL | Freq: Once | INTRAVENOUS | Status: AC
  Administered 2017-06-18: 10 mL
  Filled 2017-06-18: qty 10

## 2017-06-18 MED ORDER — FAMOTIDINE IN NACL 20-0.9 MG/50ML-% IV SOLN
INTRAVENOUS | Status: AC
  Filled 2017-06-18: qty 50

## 2017-06-18 MED ORDER — SODIUM CHLORIDE 0.9% FLUSH
10.0000 mL | INTRAVENOUS | Status: AC | PRN
  Administered 2017-06-18: 10 mL
  Filled 2017-06-18: qty 10

## 2017-06-18 MED ORDER — SODIUM CHLORIDE 0.9 % IV SOLN
Freq: Once | INTRAVENOUS | Status: AC
  Administered 2017-06-18: 10:00:00 via INTRAVENOUS

## 2017-06-18 MED ORDER — FOSAPREPITANT DIMEGLUMINE INJECTION 150 MG
Freq: Once | INTRAVENOUS | Status: AC
  Administered 2017-06-18: 12:00:00 via INTRAVENOUS
  Filled 2017-06-18: qty 5

## 2017-06-18 MED ORDER — SODIUM CHLORIDE 0.9 % IV SOLN
200.0000 mg | Freq: Once | INTRAVENOUS | Status: AC
  Administered 2017-06-18: 200 mg via INTRAVENOUS
  Filled 2017-06-18: qty 8

## 2017-06-18 MED ORDER — HEPARIN SOD (PORK) LOCK FLUSH 100 UNIT/ML IV SOLN
500.0000 [IU] | Freq: Once | INTRAVENOUS | Status: AC | PRN
  Administered 2017-06-18: 500 [IU]
  Filled 2017-06-18: qty 5

## 2017-06-18 NOTE — Telephone Encounter (Signed)
Ok to fax the note.

## 2017-06-18 NOTE — Telephone Encounter (Signed)
rec'd call from infusion room nurse Royce Macadamia. Patient is receiving first time [place] and [place] today and would like a letter faxed today to excuse him from a court appearance tomorrow. Fax # [phone]

## 2017-06-18 NOTE — Telephone Encounter (Signed)
Faxed note to excuse patient from court appearance tomorrow. [phone]

## 2017-06-18 NOTE — Patient Instructions (Addendum)
[facility] Discharge Instructions for Patients Receiving Chemotherapy  Today you received the following chemotherapy agents Keytruda, Paclitaxel, Carboplatin  To help prevent nausea and vomiting after your treatment, we encourage you to take your nausea medication as directed   If you develop nausea and vomiting that is not controlled by your nausea medication, call the clinic.   BELOW ARE SYMPTOMS THAT SHOULD BE REPORTED IMMEDIATELY:  *FEVER GREATER THAN 100.5 F  *CHILLS WITH OR WITHOUT FEVER  NAUSEA AND VOMITING THAT IS NOT CONTROLLED WITH YOUR NAUSEA MEDICATION  *UNUSUAL SHORTNESS OF BREATH  *UNUSUAL BRUISING OR BLEEDING  TENDERNESS IN MOUTH AND THROAT WITH OR WITHOUT PRESENCE OF ULCERS  *URINARY PROBLEMS  *BOWEL PROBLEMS  UNUSUAL RASH Items with * indicate a potential emergency and should be followed up as soon as possible.  Feel free to call the clinic should you have any questions or concerns. The clinic phone number is [phone].  Please show the [facility] at check-in to the Emergency Department and triage nurse.  Pembrolizumab injection What is this medicine? PEMBROLIZUMAB (pem broe liz ue mab) is a monoclonal antibody. It is used to treat melanoma, head and neck cancer, Hodgkin lymphoma, non-small cell lung cancer, urothelial cancer, stomach cancer, and cancers that have a certain genetic condition. This medicine may be used for other purposes; ask your health care provider or pharmacist if you have questions. COMMON BRAND NAME(S): Keytruda What should I tell my health care provider before I take this medicine? They need to know if you have any of these conditions: -diabetes -immune system problems -inflammatory bowel disease -liver disease -lung or breathing disease -lupus -organ transplant -an unusual or allergic reaction to pembrolizumab, other medicines, foods, dyes, or preservatives -pregnant or trying to get  pregnant -breast-feeding How should I use this medicine? This medicine is for infusion into a vein. It is given by a health care professional in a hospital or clinic setting. A special MedGuide will be given to you before each treatment. Be sure to read this information carefully each time. Talk to your pediatrician regarding the use of this medicine in children. While this drug may be prescribed for selected conditions, precautions do apply. Overdosage: If you think you have taken too much of this medicine contact a poison control center or emergency room at once. NOTE: This medicine is only for you. Do not share this medicine with others. What if I miss a dose? It is important not to miss your dose. Call your doctor or health care professional if you are unable to keep an appointment. What may interact with this medicine? Interactions have not been studied. Give your health care provider a list of all the medicines, herbs, non-prescription drugs, or dietary supplements you use. Also tell them if you smoke, drink alcohol, or use illegal drugs. Some items may interact with your medicine. This list may not describe all possible interactions. Give your health care provider a list of all the medicines, herbs, non-prescription drugs, or dietary supplements you use. Also tell them if you smoke, drink alcohol, or use illegal drugs. Some items may interact with your medicine. What should I watch for while using this medicine? Your condition will be monitored carefully while you are receiving this medicine. You may need blood work done while you are taking this medicine. Do not become pregnant while taking this medicine or for 4 months after stopping it. Women should inform their doctor if they wish to become pregnant or think  they might be pregnant. There is a potential for serious side effects to an unborn child. Talk to your health care professional or pharmacist for more information. Do not breast-feed  an infant while taking this medicine or for 4 months after the last dose. What side effects may I notice from receiving this medicine? Side effects that you should report to your doctor or health care professional as soon as possible: -allergic reactions like skin rash, itching or hives, swelling of the face, lips, or tongue -bloody or black, tarry -breathing problems -changes in vision -chest pain -chills -constipation -cough -dizziness or feeling faint or lightheaded -fast or irregular heartbeat -fever -flushing -hair loss -low blood counts - this medicine may decrease the number of white blood cells, red blood cells and platelets. You may be at increased risk for infections and bleeding. -muscle pain -muscle weakness -persistent headache -signs and symptoms of high blood sugar such as dizziness; dry mouth; dry skin; fruity breath; nausea; stomach pain; increased hunger or thirst; increased urination -signs and symptoms of kidney injury like trouble passing urine or change in the amount of urine -signs and symptoms of liver injury like dark urine, light-colored stools, loss of appetite, nausea, right upper belly pain, yellowing of the eyes or skin -stomach pain -sweating -weight loss Side effects that usually do not require medical attention (report to your doctor or health care professional if they continue or are bothersome): -decreased appetite -diarrhea -tiredness This list may not describe all possible side effects. Call your doctor for medical advice about side effects. You may report side effects to FDA at 1-800-FDA-1088. Where should I keep my medicine? This drug is given in a hospital or clinic and will not be stored at home. NOTE: This sheet is a summary. It may not cover all possible information. If you have questions about this medicine, talk to your doctor, pharmacist, or health care provider.  2018 Elsevier/Gold Standard (2015-12-06 12:29:36)  Paclitaxel  injection What is this medicine? PACLITAXEL (PAK li TAX el) is a chemotherapy drug. It targets fast dividing cells, like cancer cells, and causes these cells to die. This medicine is used to treat ovarian cancer, breast cancer, and other cancers. This medicine may be used for other purposes; ask your health care provider or pharmacist if you have questions. COMMON BRAND NAME(S): Onxol, Taxol What should I tell my health care provider before I take this medicine? They need to know if you have any of these conditions: -blood disorders -irregular heartbeat -infection (especially a virus infection such as chickenpox, cold sores, or herpes) -liver disease -previous or ongoing radiation therapy -an unusual or allergic reaction to paclitaxel, alcohol, polyoxyethylated castor oil, other chemotherapy agents, other medicines, foods, dyes, or preservatives -pregnant or trying to get pregnant -breast-feeding How should I use this medicine? This drug is given as an infusion into a vein. It is administered in a hospital or clinic by a specially trained health care professional. Talk to your pediatrician regarding the use of this medicine in children. Special care may be needed. Overdosage: If you think you have taken too much of this medicine contact a poison control center or emergency room at once. NOTE: This medicine is only for you. Do not share this medicine with others. What if I miss a dose? It is important not to miss your dose. Call your doctor or health care professional if you are unable to keep an appointment. What may interact with this medicine? Do not take this medicine with  any of the following medications: -disulfiram -metronidazole This medicine may also interact with the following medications: -cyclosporine -diazepam -ketoconazole -medicines to increase blood counts like filgrastim, pegfilgrastim, sargramostim -other chemotherapy drugs like cisplatin, doxorubicin, epirubicin,  etoposide, teniposide, vincristine -quinidine -testosterone -vaccines -verapamil Talk to your doctor or health care professional before taking any of these medicines: -acetaminophen -aspirin -ibuprofen -ketoprofen -naproxen This list may not describe all possible interactions. Give your health care provider a list of all the medicines, herbs, non-prescription drugs, or dietary supplements you use. Also tell them if you smoke, drink alcohol, or use illegal drugs. Some items may interact with your medicine. What should I watch for while using this medicine? Your condition will be monitored carefully while you are receiving this medicine. You will need important blood work done while you are taking this medicine. This medicine can cause serious allergic reactions. To reduce your risk you will need to take other medicine(s) before treatment with this medicine. If you experience allergic reactions like skin rash, itching or hives, swelling of the face, lips, or tongue, tell your doctor or health care professional right away. In some cases, you may be given additional medicines to help with side effects. Follow all directions for their use. This drug may make you feel generally unwell. This is not uncommon, as chemotherapy can affect healthy cells as well as cancer cells. Report any side effects. Continue your course of treatment even though you feel ill unless your doctor tells you to stop. Call your doctor or health care professional for advice if you get a fever, chills or sore throat, or other symptoms of a cold or flu. Do not treat yourself. This drug decreases your body's ability to fight infections. Try to avoid being around people who are sick. This medicine may increase your risk to bruise or bleed. Call your doctor or health care professional if you notice any unusual bleeding. Be careful brushing and flossing your teeth or using a toothpick because you may get an infection or bleed more  easily. If you have any dental work done, tell your dentist you are receiving this medicine. Avoid taking products that contain aspirin, acetaminophen, ibuprofen, naproxen, or ketoprofen unless instructed by your doctor. These medicines may hide a fever. Do not become pregnant while taking this medicine. Women should inform their doctor if they wish to become pregnant or think they might be pregnant. There is a potential for serious side effects to an unborn child. Talk to your health care professional or pharmacist for more information. Do not breast-feed an infant while taking this medicine. Men are advised not to father a child while receiving this medicine. This product may contain alcohol. Ask your pharmacist or healthcare provider if this medicine contains alcohol. Be sure to tell all healthcare providers you are taking this medicine. Certain medicines, like metronidazole and disulfiram, can cause an unpleasant reaction when taken with alcohol. The reaction includes flushing, headache, nausea, vomiting, sweating, and increased thirst. The reaction can last from 30 minutes to several hours. What side effects may I notice from receiving this medicine? Side effects that you should report to your doctor or health care professional as soon as possible: -allergic reactions like skin rash, itching or hives, swelling of the face, lips, or tongue -low blood counts - This drug may decrease the number of white blood cells, red blood cells and platelets. You may be at increased risk for infections and bleeding. -signs of infection - fever or chills, cough, sore throat,  pain or difficulty passing urine -signs of decreased platelets or bleeding - bruising, pinpoint red spots on the skin, black, tarry stools, nosebleeds -signs of decreased red blood cells - unusually weak or tired, fainting spells, lightheadedness -breathing problems -chest pain -high or low blood pressure -mouth sores -nausea and  vomiting -pain, swelling, redness or irritation at the injection site -pain, tingling, numbness in the hands or feet -slow or irregular heartbeat -swelling of the ankle, feet, hands Side effects that usually do not require medical attention (report to your doctor or health care professional if they continue or are bothersome): -bone pain -complete hair loss including hair on your head, underarms, pubic hair, eyebrows, and eyelashes -changes in the color of fingernails -diarrhea -loosening of the fingernails -loss of appetite -muscle or joint pain -red flush to skin -sweating This list may not describe all possible side effects. Call your doctor for medical advice about side effects. You may report side effects to FDA at 1-800-FDA-1088. Where should I keep my medicine? This drug is given in a hospital or clinic and will not be stored at home. NOTE: This sheet is a summary. It may not cover all possible information. If you have questions about this medicine, talk to your doctor, pharmacist, or health care provider.  2018 Elsevier/Gold Standard (2014-12-28 19:58:00)  Carboplatin injection What is this medicine? CARBOPLATIN (KAR boe pla tin) is a chemotherapy drug. It targets fast dividing cells, like cancer cells, and causes these cells to die. This medicine is used to treat ovarian cancer and many other cancers. This medicine may be used for other purposes; ask your health care provider or pharmacist if you have questions. COMMON BRAND NAME(S): Paraplatin What should I tell my health care provider before I take this medicine? They need to know if you have any of these conditions: -blood disorders -hearing problems -kidney disease -recent or ongoing radiation therapy -an unusual or allergic reaction to carboplatin, cisplatin, other chemotherapy, other medicines, foods, dyes, or preservatives -pregnant or trying to get pregnant -breast-feeding How should I use this medicine? This  drug is usually given as an infusion into a vein. It is administered in a hospital or clinic by a specially trained health care professional. Talk to your pediatrician regarding the use of this medicine in children. Special care may be needed. Overdosage: If you think you have taken too much of this medicine contact a poison control center or emergency room at once. NOTE: This medicine is only for you. Do not share this medicine with others. What if I miss a dose? It is important not to miss a dose. Call your doctor or health care professional if you are unable to keep an appointment. What may interact with this medicine? -medicines for seizures -medicines to increase blood counts like filgrastim, pegfilgrastim, sargramostim -some antibiotics like amikacin, gentamicin, neomycin, streptomycin, tobramycin -vaccines Talk to your doctor or health care professional before taking any of these medicines: -acetaminophen -aspirin -ibuprofen -ketoprofen -naproxen This list may not describe all possible interactions. Give your health care provider a list of all the medicines, herbs, non-prescription drugs, or dietary supplements you use. Also tell them if you smoke, drink alcohol, or use illegal drugs. Some items may interact with your medicine. What should I watch for while using this medicine? Your condition will be monitored carefully while you are receiving this medicine. You will need important blood work done while you are taking this medicine. This drug may make you feel generally unwell. This is not  uncommon, as chemotherapy can affect healthy cells as well as cancer cells. Report any side effects. Continue your course of treatment even though you feel ill unless your doctor tells you to stop. In some cases, you may be given additional medicines to help with side effects. Follow all directions for their use. Call your doctor or health care professional for advice if you get a fever, chills or sore  throat, or other symptoms of a cold or flu. Do not treat yourself. This drug decreases your body's ability to fight infections. Try to avoid being around people who are sick. This medicine may increase your risk to bruise or bleed. Call your doctor or health care professional if you notice any unusual bleeding. Be careful brushing and flossing your teeth or using a toothpick because you may get an infection or bleed more easily. If you have any dental work done, tell your dentist you are receiving this medicine. Avoid taking products that contain aspirin, acetaminophen, ibuprofen, naproxen, or ketoprofen unless instructed by your doctor. These medicines may hide a fever. Do not become pregnant while taking this medicine. Women should inform their doctor if they wish to become pregnant or think they might be pregnant. There is a potential for serious side effects to an unborn child. Talk to your health care professional or pharmacist for more information. Do not breast-feed an infant while taking this medicine. What side effects may I notice from receiving this medicine? Side effects that you should report to your doctor or health care professional as soon as possible: -allergic reactions like skin rash, itching or hives, swelling of the face, lips, or tongue -signs of infection - fever or chills, cough, sore throat, pain or difficulty passing urine -signs of decreased platelets or bleeding - bruising, pinpoint red spots on the skin, black, tarry stools, nosebleeds -signs of decreased red blood cells - unusually weak or tired, fainting spells, lightheadedness -breathing problems -changes in hearing -changes in vision -chest pain -high blood pressure -low blood counts - This drug may decrease the number of white blood cells, red blood cells and platelets. You may be at increased risk for infections and bleeding. -nausea and vomiting -pain, swelling, redness or irritation at the injection site -pain,  tingling, numbness in the hands or feet -problems with balance, talking, walking -trouble passing urine or change in the amount of urine Side effects that usually do not require medical attention (report to your doctor or health care professional if they continue or are bothersome): -hair loss -loss of appetite -metallic taste in the mouth or changes in taste This list may not describe all possible side effects. Call your doctor for medical advice about side effects. You may report side effects to FDA at 1-800-FDA-1088. Where should I keep my medicine? This drug is given in a hospital or clinic and will not be stored at home. NOTE: This sheet is a summary. It may not cover all possible information. If you have questions about this medicine, talk to your doctor, pharmacist, or health care provider.  2018 Elsevier/Gold Standard (2007-06-03 14:38:05)

## 2017-06-19 ENCOUNTER — Ambulatory Visit
Admission: RE | Admit: 2017-06-19 | Discharge: 2017-06-19 | Disposition: A | Discharge status: Home / Self Care | Payer: Medicaid Other | Source: Ambulatory Visit | Attending: Radiation Oncology | Admitting: Radiation Oncology

## 2017-06-19 DIAGNOSIS — C7949 Secondary malignant neoplasm of other parts of nervous system: Principal | ICD-10-CM

## 2017-06-19 DIAGNOSIS — C7931 Secondary malignant neoplasm of brain: Secondary | ICD-10-CM

## 2017-06-19 MED ORDER — GADOBENATE DIMEGLUMINE 529 MG/ML IV SOLN
13.0000 mL | Freq: Once | INTRAVENOUS | Status: AC | PRN
  Administered 2017-06-19: 13 mL via INTRAVENOUS

## 2017-06-20 ENCOUNTER — Ambulatory Visit: Payer: Medicaid Other | Admitting: Radiation Oncology

## 2017-06-20 ENCOUNTER — Ambulatory Visit: Payer: Medicaid Other

## 2017-06-20 ENCOUNTER — Telehealth: Payer: Self-pay | Admitting: *Deleted

## 2017-06-20 MED ORDER — PEGFILGRASTIM-CBQV 6 MG/0.6ML ~~LOC~~ SOSY
PREFILLED_SYRINGE | SUBCUTANEOUS | Status: AC
  Filled 2017-06-20: qty 0.6

## 2017-06-20 NOTE — Telephone Encounter (Signed)
Returning patient's wife's call c/o generalized pain after chemotherapy treatment. Left my name and number

## 2017-06-20 NOTE — Telephone Encounter (Signed)
-----   Message from Pincus Large sent at 06/20/2017  9:33 AM EDT ----- Regarding: pt complaining about body aches and pain beginning last night after his infusion  I received a call from [place] wife, Eric Mason, this morning requesting a call back. She said that Eric Mason is "hurting all over". The pain began last night and is not localized, but all over his body.       Eric Mason has a job interview today at 11:00 and has requested that you call back around 12:30 or later so that she will be able to take the call.   Thank you, Mont Dutton R.T.(R)(T)

## 2017-06-20 NOTE — Telephone Encounter (Signed)
Correction patient's wife's name is heather.

## 2017-06-20 NOTE — Telephone Encounter (Signed)
-----   Message from Pincus Large sent at 06/20/2017  9:33 AM EDT ----- Regarding: pt complaining about body aches and pain beginning last night after his infusion  I received a call from [place] wife, [name], this morning requesting a call back. She said that Gerald Stabs is "hurting all over". The pain began last night and is not localized, but all over his body.       [name] has a job interview today at 11:00 and has requested that you call back around 12:30 or later so that she will be able to take the call.   Thank you, Mont Dutton R.T.(R)(T)

## 2017-06-21 ENCOUNTER — Emergency Department (HOSPITAL_COMMUNITY)
Admission: EM | Admit: 2017-06-21 | Discharge: 2017-06-21 | Disposition: A | Discharge status: Home / Self Care | Payer: Medicaid Other | Attending: Emergency Medicine | Admitting: Emergency Medicine

## 2017-06-21 ENCOUNTER — Telehealth: Payer: Self-pay | Admitting: *Deleted

## 2017-06-21 ENCOUNTER — Encounter: Payer: Self-pay | Admitting: *Deleted

## 2017-06-21 ENCOUNTER — Encounter (HOSPITAL_COMMUNITY): Payer: Self-pay | Admitting: Emergency Medicine

## 2017-06-21 DIAGNOSIS — R112 Nausea with vomiting, unspecified: Secondary | ICD-10-CM

## 2017-06-21 DIAGNOSIS — R197 Diarrhea, unspecified: Secondary | ICD-10-CM | POA: Insufficient documentation

## 2017-06-21 LAB — COMPREHENSIVE METABOLIC PANEL
ALK PHOS: 75 U/L (ref 38–126)
ALT: 12 U/L — ABNORMAL LOW (ref 17–63)
ANION GAP: 12 (ref 5–15)
AST: 21 U/L (ref 15–41)
Albumin: 3.2 g/dL — ABNORMAL LOW (ref 3.5–5.0)
BILIRUBIN TOTAL: 0.9 mg/dL (ref 0.3–1.2)
BUN: 5 mg/dL — AB (ref 6–20)
CALCIUM: 9 mg/dL (ref 8.9–10.3)
CO2: 26 mmol/L (ref 22–32)
Chloride: 103 mmol/L (ref 101–111)
Creatinine, Ser: 0.84 mg/dL (ref 0.61–1.24)
GFR calc Af Amer: >60 mL/min (ref >60)
Glucose, Bld: 110 mg/dL — ABNORMAL HIGH (ref 65–99)
Potassium: 3.5 mmol/L (ref 3.5–5.1)
Sodium: 141 mmol/L (ref 135–145)
TOTAL PROTEIN: 7.4 g/dL (ref 6.5–8.1)

## 2017-06-21 LAB — CBC WITH DIFFERENTIAL/PLATELET
BASOS ABS: 0 10*3/uL (ref 0.0–0.1)
BASOS PCT: 0 %
EOS PCT: 0 %
Eosinophils Absolute: 0 10*3/uL (ref 0.0–0.7)
HCT: 35.3 % — ABNORMAL LOW (ref 39.0–52.0)
Hemoglobin: 11.4 g/dL — ABNORMAL LOW (ref 13.0–17.0)
Lymphocytes Relative: 54 %
Lymphs Abs: 2.6 10*3/uL (ref 0.7–4.0)
MCH: 27.3 pg (ref 26.0–34.0)
MCHC: 32.3 g/dL (ref 30.0–36.0)
MCV: 84.4 fL (ref 78.0–100.0)
MONO ABS: 0.1 10*3/uL (ref 0.1–1.0)
Monocytes Relative: 2 %
NEUTROS ABS: 2.2 10*3/uL (ref 1.7–7.7)
Neutrophils Relative %: 44 %
PLATELETS: 477 10*3/uL — AB (ref 150–400)
RBC: 4.18 MIL/uL — ABNORMAL LOW (ref 4.22–5.81)
RDW: 16 % — AB (ref 11.5–15.5)
WBC: 4.9 10*3/uL (ref 4.0–10.5)

## 2017-06-21 LAB — URINALYSIS, ROUTINE W REFLEX MICROSCOPIC
BILIRUBIN URINE: NEGATIVE
Glucose, UA: NEGATIVE mg/dL
Hgb urine dipstick: NEGATIVE
KETONES UR: NEGATIVE mg/dL
Leukocytes, UA: NEGATIVE
NITRITE: NEGATIVE
PROTEIN: NEGATIVE mg/dL
Specific Gravity, Urine: 1.008 (ref 1.005–1.030)
pH: 7 (ref 5.0–8.0)

## 2017-06-21 LAB — LIPASE, BLOOD: Lipase: 25 U/L (ref 11–51)

## 2017-06-21 MED ORDER — SODIUM CHLORIDE 0.9 % IV BOLUS
1000.0000 mL | Freq: Once | INTRAVENOUS | Status: AC
  Administered 2017-06-21: 1000 mL via INTRAVENOUS

## 2017-06-21 MED ORDER — OXYCODONE HCL 5 MG PO TABS
15.0000 mg | ORAL_TABLET | Freq: Once | ORAL | Status: AC
  Administered 2017-06-21: 15 mg via ORAL
  Filled 2017-06-21: qty 3

## 2017-06-21 MED ORDER — ONDANSETRON 4 MG PO TBDP: 4.0000 mg | ORAL_TABLET | Freq: Three times a day (TID) | ORAL | 0 refills | Status: DC | PRN

## 2017-06-21 MED ORDER — MORPHINE SULFATE (PF) 4 MG/ML IV SOLN
4.0000 mg | Freq: Once | INTRAVENOUS | Status: AC
  Administered 2017-06-21: 4 mg via INTRAVENOUS
  Filled 2017-06-21: qty 1

## 2017-06-21 MED ORDER — ONDANSETRON HCL 4 MG/2ML IJ SOLN
4.0000 mg | Freq: Once | INTRAMUSCULAR | Status: AC
  Administered 2017-06-21: 4 mg via INTRAVENOUS
  Filled 2017-06-21: qty 2

## 2017-06-21 NOTE — Telephone Encounter (Signed)
Patient rec's first dose of taxol,keytruda and carboplatin on 06/18/17. Patient's wife heather calling. States patient has been up since 3 am  in terrible pain, all over, generalized. Diarrhea 3 times yesterday and once today. Vomited x 1 today. Per dr Alen Blew, he is to report to the emergency room. Wife verbalized understanding.
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
## 2017-06-21 NOTE — ED Triage Notes (Signed)
Patient presents with wife stating patient had chemo treatment on Tuesday and has been vomiting ever since. Contacted oncologist and was sent for fluids.

## 2017-06-21 NOTE — Telephone Encounter (Signed)
rec'd call from [facility] in rad/onc. States patient would like a cll from desk nurse. Called both phone numbers and left messages to call me.

## 2017-06-24 ENCOUNTER — Ambulatory Visit: Payer: Medicaid Other | Admitting: Radiation Oncology

## 2017-06-24 ENCOUNTER — Telehealth: Payer: Self-pay | Admitting: Radiation Therapy

## 2017-06-24 NOTE — Telephone Encounter (Signed)
Received a call from [facility] about Eric Mason's pain again this morning. I told her that the only thing that I can do is try and get him in to see Dr. Maryjean Ka sooner. He currently has an appointment on 4/23.   I called Dr. Donell Sievert office , but unfortunately there is not a sooner appointment available. I also sent an in-basket to Dr. Hazeline Junker nurse and left a voicemail on his nurse line requesting a call back about Eric Mason's complaint of pain since his infusion.   I informed [name] that there are no earlier appointments available for him to see Dr. Maryjean Ka. I also shared the results of his recent brain MRI. She was happy to hear that the scan does not show anything new, but still concerned about her husband's current pain situation. I encouraged her to keep the appointment he has on 4/23, and shared that if I hear anything new from Dr. Hazeline Junker office today, I will let her know.    Mont Dutton R.T.(R)(T) Special Procedures Navigator

## 2017-06-25 ENCOUNTER — Telehealth: Payer: Self-pay | Admitting: *Deleted

## 2017-06-25 NOTE — Telephone Encounter (Signed)
Per Dr. Hazeline Junker in basket message, I set patient up in Symptom management clinic on 06/26/17 @ 10 am for complaint of "all over pain issues". Wife verbalized understanding.

## 2017-06-25 NOTE — Addendum Note (Signed)
Encounter addended by: Hayden Pedro, PA-C on: 06/25/2017 8:57 AM  Actions taken: Sign clinical note

## 2017-06-26 ENCOUNTER — Inpatient Hospital Stay (HOSPITAL_BASED_OUTPATIENT_CLINIC_OR_DEPARTMENT_OTHER): Payer: Medicaid Other | Admitting: Medical

## 2017-06-26 ENCOUNTER — Telehealth: Payer: Self-pay

## 2017-06-26 VITALS — BP 120/80 | HR 90 | Temp 98.8°F | Resp 18

## 2017-06-26 DIAGNOSIS — Z5111 Encounter for antineoplastic chemotherapy: Secondary | ICD-10-CM | POA: Diagnosis not present

## 2017-06-26 DIAGNOSIS — Z79899 Other long term (current) drug therapy: Secondary | ICD-10-CM

## 2017-06-26 DIAGNOSIS — C3490 Malignant neoplasm of unspecified part of unspecified bronchus or lung: Secondary | ICD-10-CM | POA: Diagnosis not present

## 2017-06-26 DIAGNOSIS — C7951 Secondary malignant neoplasm of bone: Secondary | ICD-10-CM | POA: Diagnosis not present

## 2017-06-26 DIAGNOSIS — C7931 Secondary malignant neoplasm of brain: Secondary | ICD-10-CM | POA: Diagnosis not present

## 2017-06-26 DIAGNOSIS — G893 Neoplasm related pain (acute) (chronic): Secondary | ICD-10-CM

## 2017-06-26 MED ORDER — OXYCODONE HCL 15 MG PO TABS: 15.0000 mg | ORAL_TABLET | ORAL | 0 refills | Status: DC | PRN

## 2017-06-26 MED ORDER — DULOXETINE HCL 30 MG PO CPEP: 30.0000 mg | ORAL_CAPSULE | Freq: Every day | ORAL | 5 refills | Status: DC

## 2017-06-26 NOTE — Telephone Encounter (Signed)
Patient running late and Okayed to still come to appointment by RN. Per 4/17 phone que

## 2017-06-26 NOTE — Patient Instructions (Signed)
Implanted Port Home Guide An implanted port is a type of central line that is placed under the skin. Central lines are used to provide IV access when treatment or nutrition needs to be given through a person's veins. Implanted ports are used for long-term IV access. An implanted port may be placed because:  You need IV medicine that would be irritating to the small veins in your hands or arms.  You need long-term IV medicines, such as antibiotics.  You need IV nutrition for a long period.  You need frequent blood draws for lab tests.  You need dialysis.  Implanted ports are usually placed in the chest area, but they can also be placed in the upper arm, the abdomen, or the leg. An implanted port has two main parts:  Reservoir. The reservoir is round and will appear as a small, raised area under your skin. The reservoir is the part where a needle is inserted to give medicines or draw blood.  Catheter. The catheter is a thin, flexible tube that extends from the reservoir. The catheter is placed into a large vein. Medicine that is inserted into the reservoir goes into the catheter and then into the vein.  How will I care for my incision site? Do not get the incision site wet. Bathe or shower as directed by your health care provider. How is my port accessed? Special steps must be taken to access the port:  Before the port is accessed, a numbing cream can be placed on the skin. This helps numb the skin over the port site.  Your health care provider uses a sterile technique to access the port. ? Your health care provider must put on a mask and sterile gloves. ? The skin over your port is cleaned carefully with an antiseptic and allowed to dry. ? The port is gently pinched between sterile gloves, and a needle is inserted into the port.  Only "non-coring" port needles should be used to access the port. Once the port is accessed, a blood return should be checked. This helps ensure that the port  is in the vein and is not clogged.  If your port needs to remain accessed for a constant infusion, a clear (transparent) bandage will be placed over the needle site. The bandage and needle will need to be changed every week, or as directed by your health care provider.  Keep the bandage covering the needle clean and dry. Do not get it wet. Follow your health care provider's instructions on how to take a shower or bath while the port is accessed.  If your port does not need to stay accessed, no bandage is needed over the port.  What is flushing? Flushing helps keep the port from getting clogged. Follow your health care provider's instructions on how and when to flush the port. Ports are usually flushed with saline solution or a medicine called heparin. The need for flushing will depend on how the port is used.  If the port is used for intermittent medicines or blood draws, the port will need to be flushed: ? After medicines have been given. ? After blood has been drawn. ? As part of routine maintenance.  If a constant infusion is running, the port may not need to be flushed.  How long will my port stay implanted? The port can stay in for as long as your health care provider thinks it is needed. When it is time for the port to come out, surgery will be   done to remove it. The procedure is similar to the one performed when the port was put in. When should I seek immediate medical care? When you have an implanted port, you should seek immediate medical care if:  You notice a bad smell coming from the incision site.  You have swelling, redness, or drainage at the incision site.  You have more swelling or pain at the port site or the surrounding area.  You have a fever that is not controlled with medicine.  This information is not intended to replace advice given to you by your health care provider. Make sure you discuss any questions you have with your health care provider. Document  Released: 02/26/2005 Document Revised: 08/04/2015 Document Reviewed: 11/03/2012 Elsevier Interactive Patient Education  2017 [employer]  
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
## 2017-07-04 ENCOUNTER — Other Ambulatory Visit: Payer: Medicaid Other

## 2017-07-08 ENCOUNTER — Ambulatory Visit: Payer: Self-pay | Admitting: Radiation Oncology

## 2017-07-09 ENCOUNTER — Inpatient Hospital Stay: Payer: Medicaid Other

## 2017-07-09 ENCOUNTER — Telehealth: Payer: Self-pay

## 2017-07-09 ENCOUNTER — Inpatient Hospital Stay (HOSPITAL_BASED_OUTPATIENT_CLINIC_OR_DEPARTMENT_OTHER): Payer: Medicaid Other | Admitting: Oncology

## 2017-07-09 DIAGNOSIS — C3490 Malignant neoplasm of unspecified part of unspecified bronchus or lung: Secondary | ICD-10-CM | POA: Diagnosis not present

## 2017-07-09 DIAGNOSIS — Z79899 Other long term (current) drug therapy: Secondary | ICD-10-CM | POA: Diagnosis not present

## 2017-07-09 DIAGNOSIS — C7931 Secondary malignant neoplasm of brain: Secondary | ICD-10-CM

## 2017-07-09 DIAGNOSIS — C7951 Secondary malignant neoplasm of bone: Secondary | ICD-10-CM

## 2017-07-09 DIAGNOSIS — G893 Neoplasm related pain (acute) (chronic): Secondary | ICD-10-CM

## 2017-07-09 DIAGNOSIS — Z95828 Presence of other vascular implants and grafts: Secondary | ICD-10-CM

## 2017-07-09 DIAGNOSIS — Z5111 Encounter for antineoplastic chemotherapy: Secondary | ICD-10-CM | POA: Diagnosis not present

## 2017-07-09 LAB — CMP (CANCER CENTER ONLY)
ALBUMIN: 3.4 g/dL — AB (ref 3.5–5.0)
ALK PHOS: 93 U/L (ref 40–150)
ALT: 9 U/L (ref 0–55)
AST: 14 U/L (ref 5–34)
Anion gap: 9 (ref 3–11)
BILIRUBIN TOTAL: 0.3 mg/dL (ref 0.2–1.2)
BUN: 5 mg/dL — AB (ref 7–26)
CALCIUM: 8.8 mg/dL (ref 8.4–10.4)
CO2: 25 mmol/L (ref 22–29)
CREATININE: 0.83 mg/dL (ref 0.70–1.30)
Chloride: 104 mmol/L (ref 98–109)
GFR, Est AFR Am: >60 mL/min (ref >60)
GFR, Estimated: >60 mL/min (ref >60)
GLUCOSE: 99 mg/dL (ref 70–140)
Potassium: 3.4 mmol/L — ABNORMAL LOW (ref 3.5–5.1)
SODIUM: 138 mmol/L (ref 136–145)
TOTAL PROTEIN: 7.3 g/dL (ref 6.4–8.3)

## 2017-07-09 LAB — CBC WITH DIFFERENTIAL (CANCER CENTER ONLY)
BASOS ABS: 0.1 10*3/uL (ref 0.0–0.1)
BASOS PCT: 1 %
Eosinophils Absolute: 0.1 10*3/uL (ref 0.0–0.5)
Eosinophils Relative: 1 %
HEMATOCRIT: 33.5 % — AB (ref 38.4–49.9)
HEMOGLOBIN: 10.9 g/dL — AB (ref 13.0–17.1)
Lymphocytes Relative: 37 %
Lymphs Abs: 2.5 10*3/uL (ref 0.9–3.3)
MCH: 27.9 pg (ref 27.2–33.4)
MCHC: 32.4 g/dL (ref 32.0–36.0)
MCV: 86.2 fL (ref 79.3–98.0)
MONOS PCT: 12 %
Monocytes Absolute: 0.8 10*3/uL (ref 0.1–0.9)
NEUTROS ABS: 3.3 10*3/uL (ref 1.5–6.5)
NEUTROS PCT: 49 %
Platelet Count: 362 10*3/uL (ref 140–400)
RBC: 3.89 MIL/uL — AB (ref 4.20–5.82)
RDW: 17.9 % — ABNORMAL HIGH (ref 11.0–14.6)
WBC: 6.8 10*3/uL (ref 4.0–10.3)

## 2017-07-09 MED ORDER — DIPHENHYDRAMINE HCL 50 MG/ML IJ SOLN
50.0000 mg | Freq: Once | INTRAMUSCULAR | Status: AC
  Administered 2017-07-09: 50 mg via INTRAVENOUS

## 2017-07-09 MED ORDER — PALONOSETRON HCL INJECTION 0.25 MG/5ML
INTRAVENOUS | Status: AC
  Filled 2017-07-09: qty 5

## 2017-07-09 MED ORDER — PALONOSETRON HCL INJECTION 0.25 MG/5ML
0.2500 mg | Freq: Once | INTRAVENOUS | Status: AC
  Administered 2017-07-09: 0.25 mg via INTRAVENOUS

## 2017-07-09 MED ORDER — FAMOTIDINE IN NACL 20-0.9 MG/50ML-% IV SOLN
20.0000 mg | Freq: Once | INTRAVENOUS | Status: AC
  Administered 2017-07-09: 20 mg via INTRAVENOUS

## 2017-07-09 MED ORDER — FOSAPREPITANT DIMEGLUMINE INJECTION 150 MG
Freq: Once | INTRAVENOUS | Status: AC
  Administered 2017-07-09: 10:00:00 via INTRAVENOUS
  Filled 2017-07-09: qty 5

## 2017-07-09 MED ORDER — SODIUM CHLORIDE 0.9 % IV SOLN
492.8000 mg | Freq: Once | INTRAVENOUS | Status: AC
  Administered 2017-07-09: 490 mg via INTRAVENOUS
  Filled 2017-07-09: qty 49

## 2017-07-09 MED ORDER — SODIUM CHLORIDE 0.9 % IV SOLN
Freq: Once | INTRAVENOUS | Status: AC
  Administered 2017-07-09: 10:00:00 via INTRAVENOUS

## 2017-07-09 MED ORDER — HEPARIN SOD (PORK) LOCK FLUSH 100 UNIT/ML IV SOLN
500.0000 [IU] | Freq: Once | INTRAVENOUS | Status: AC | PRN
Start: 2017-07-09 — End: 2017-07-09
  Administered 2017-07-09: 500 [IU]
  Filled 2017-07-09: qty 5

## 2017-07-09 MED ORDER — SODIUM CHLORIDE 0.9% FLUSH
10.0000 mL | Freq: Once | INTRAVENOUS | Status: AC
  Administered 2017-07-09: 10 mL
  Filled 2017-07-09: qty 10

## 2017-07-09 MED ORDER — SODIUM CHLORIDE 0.9 % IV SOLN
200.0000 mg | Freq: Once | INTRAVENOUS | Status: AC
  Administered 2017-07-09: 200 mg via INTRAVENOUS
  Filled 2017-07-09: qty 8

## 2017-07-09 MED ORDER — FAMOTIDINE IN NACL 20-0.9 MG/50ML-% IV SOLN
INTRAVENOUS | Status: AC
  Filled 2017-07-09: qty 50

## 2017-07-09 MED ORDER — SODIUM CHLORIDE 0.9 % IV SOLN
130.0000 mg/m2 | Freq: Once | INTRAVENOUS | Status: AC
  Administered 2017-07-09: 228 mg via INTRAVENOUS
  Filled 2017-07-09: qty 38

## 2017-07-09 MED ORDER — OXYCODONE HCL 15 MG PO TABS: 15.0000 mg | ORAL_TABLET | ORAL | 0 refills | Status: DC | PRN

## 2017-07-09 MED ORDER — DIPHENHYDRAMINE HCL 50 MG/ML IJ SOLN
INTRAMUSCULAR | Status: AC
  Filled 2017-07-09: qty 1

## 2017-07-09 MED ORDER — SODIUM CHLORIDE 0.9% FLUSH
10.0000 mL | INTRAVENOUS | Status: DC | PRN
  Administered 2017-07-09: 10 mL
  Filled 2017-07-09: qty 10

## 2017-07-09 MED FILL — oxyCODONE HCL 15 MG TABS: 15 | 30 days supply | Qty: 180 | Fill #0

## 2017-07-09 NOTE — Progress Notes (Signed)
Hematology and Oncology Follow Up Visit  Eric Mason [id number] Mar 06, 1971 47 y.o. 07/09/2017 8:59 AM Eric Mason, [name], [name], MD   Principle Diagnosis: 46 year old with advanced malignancy arising from the lung.  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  He was initially diagnosed in May 2018.    Prior Therapy:   Status post craniotomy and resection in May 2018. This was followed by [facility] completed in June 2018.  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   Current therapy: Carboplatin and paclitaxel with Pembrolizumab cycle 1 given on 06/18/2017.  Interim History: Eric Mason presents today for a follow-up.  Since last visit, he received the first cycle of chemotherapy with few complications.  He reported diffuse arthralgias and myalgias associated with chemotherapy that lasted for 3 weeks.  He also reported some nausea and poor p.o. intake.  He did not receive Neulasta after the first cycle of chemotherapy.  He was seen in the emergency department for these complaints.  He continues to have issues with pain and did receive temporary prescription for pain medication last week.  He is seeing a pain specialist but has not prescribed any pain medication for him.  His pain is rather diffuse and be on his lower back pain.  He denies any headaches blurred vision, syncope or seizures.  He does not report any any fevers or chills or sweats.  He denies any cough, wheezing or hemoptysis.  He denies any chest pain, palpitation, orthopnea or leg edema. He is not reporting nausea, vomiting or abdominal pain. He does not report any frequency urgency or hesitancy.  He denies any pathological fractures or bone pain.  He does not report any skin rashes or lesions.  He does not report any lymphadenopathy or  petechiae.  He denies any heat or cold intolerance.  He denies any anxiety or depression.  Remaining review of systems is negative.    Medications: I have reviewed the patient's current medications.  Current Outpatient Medications  Medication Sig Dispense Refill  . albuterol (PROVENTIL HFA;VENTOLIN HFA) 108 (90 Base) MCG/ACT inhaler Inhale 2 puffs into the lungs every 6 (six) hours as needed for wheezing or shortness of breath. 1 Inhaler 6  . amitriptyline (ELAVIL) 25 MG tablet Take 25 mg by mouth at bedtime.    [name] amoxicillin-clavulanate (AUGMENTIN) 875-125 MG tablet Take 1 tablet by mouth 2 (two) times daily. (Patient not taking: Reported on 06/21/2017) 10 tablet 1  . B Complex-C (B-COMPLEX WITH VITAMIN C) tablet Take 1 tablet by mouth daily.    . Calcium Carb-Cholecalciferol (CALCIUM 500 +D) 500-400 MG-UNIT TABS 1 tab PO BID 60 tablet 2  . dexamethasone (DECADRON) 4 MG tablet Take 1 tablet (4 mg total) by mouth 2 (two) times daily. (Patient not taking: Reported on 06/21/2017) 40 tablet 1  . DULoxetine (CYMBALTA) 30 MG capsule Take 1 capsule (30 mg total) by mouth daily. 30 capsule 5  . fluconazole (DIFLUCAN) 100 MG tablet Take 1 tablet (100 mg total) by mouth daily. Take 2 tablets on day 1, then one tablet daily thereafter. (Patient not taking: Reported on 06/21/2017) 8 tablet 0  . folic acid (FOLVITE) 1 MG tablet Take 1 tablet (1 mg total) by mouth daily. 100 tablet 1  . gabapentin (NEURONTIN) 600 MG tablet Take 1 tablet (600 mg total) by mouth 3 (three)  times daily. 90 tablet 2  . levETIRAcetam (KEPPRA) 1000 MG tablet Take 1 tablet (1,000 mg total) by mouth 2 (two) times daily. 60 tablet 5  . lidocaine-prilocaine (EMLA) cream Apply 1 application topically as needed. (Patient not taking: Reported on 06/21/2017) 30 g 0  . Multiple Vitamin (MULTIVITAMIN) tablet Take 1 tablet by mouth daily.    . nicotine (NICODERM CQ - DOSED IN MG/24 HOURS) 21 mg/24hr patch Place 1 patch (21 mg total) onto the skin  daily. (Patient not taking: Reported on 06/21/2017) 28 patch 3  . ondansetron (ZOFRAN ODT) 4 MG disintegrating tablet Take 1 tablet (4 mg total) by mouth every 8 (eight) hours as needed for nausea or vomiting. 12 tablet 0  . ondansetron (ZOFRAN) 8 MG tablet Take 1 tablet (8 mg total) by mouth every 8 (eight) hours as needed for nausea or vomiting. (Patient not taking: Reported on 06/26/2017) 30 tablet 3  . oxyCODONE (ROXICODONE) 15 MG immediate release tablet Take 1 tablet (15 mg total) by mouth every 4 (four) hours as needed for up to 13 days for pain. 80 tablet 0  . pantoprazole (PROTONIX) 40 MG tablet Take 1 tablet (40 mg total) by mouth 2 (two) times daily. 60 tablet 0  . prochlorperazine (COMPAZINE) 10 MG tablet Take 1 tablet (10 mg total) by mouth every 6 (six) hours as needed for nausea or vomiting. 30 tablet 0  . ranolazine (RANEXA) 500 MG 12 hr tablet Take 1 tablet (500 mg total) by mouth 2 (two) times daily. 60 tablet 11  . senna-docusate (SENNA S) 8.6-50 MG tablet Take 1 tablet by mouth daily. (Patient not taking: Reported on 04/29/2017) 30 tablet 3  . thiamine (VITAMIN B-1) 100 MG tablet Take 1 tablet (100 mg total) by mouth daily. 100 tablet 0   No current facility-administered medications for this visit.    Facility-Administered Medications Ordered in Other Visits  Medication Dose Route Frequency Provider Last Rate Last Dose  . sodium chloride flush (NS) 0.9 % injection 10 mL  10 mL Intracatheter PRN Wyatt Portela, MD   10 mL at 06/18/17 1815     Allergies:  Allergies  Allergen Reactions  . Latex     UNSPECIFIED REACTION     Past Medical History, Surgical history, Social history, and Family History reviewed today and remain unchanged.  Physical Exam: Blood pressure 133/87, pulse 78, temperature 98.5 F (36.9 C), temperature source Oral, resp. rate 18, height 5\' 10"  (1.778 m), weight 134 lb 3.2 oz (60.9 kg), SpO2 100 %.   ECOG: 1 General appearance: Alert, awake gentleman  without distress. Head: Normocephalic without abnormalities. Oropharynx: No oral thrush or ulcers. Eyes: Pupils are equal and round reactive to light. Lymph nodes: No lymphadenopathy noted cervical, supraclavicular, and axillary regions. Heart: Regular rate and rhythm without any murmurs or gallops. Lung: Clear to auscultation without any rhonchi, wheezes or dullness to percussion. Abdomin: Soft, nontender without any rebound or guarding. Musculoskeletal: No joint deformity or effusion. Neurological: Intact motor, sensory exam without any deficits   Lab Results: Lab Results  Component Value Date   WBC 4.9 06/21/2017   HGB 11.4 (L) 06/21/2017   HCT 35.3 (L) 06/21/2017   MCV 84.4 06/21/2017   PLT 477 (H) 06/21/2017     Chemistry      Component Value Date/Time   NA 141 06/21/2017 1610   NA 137 10/30/2016 0909   K 3.5 06/21/2017 1610   K 3.3 (L) 10/30/2016 0909   CL 103  06/21/2017 1610   CO2 26 06/21/2017 1610   CO2 27 10/30/2016 0909   BUN 5 (L) 06/21/2017 1610   BUN 4.8 (L) 10/30/2016 0909   CREATININE 0.84 06/21/2017 1610   CREATININE 0.78 06/18/2017 0825   CREATININE 0.9 10/30/2016 0909      Component Value Date/Time   CALCIUM 9.0 06/21/2017 1610   CALCIUM 9.3 10/30/2016 0909   ALKPHOS 75 06/21/2017 1610   ALKPHOS 93 10/30/2016 0909   AST 21 06/21/2017 1610   AST 18 06/18/2017 0825   AST 35 (H) 10/30/2016 0909   ALT 12 (L) 06/21/2017 1610   ALT 9 06/18/2017 0825   ALT 25 10/30/2016 0909   BILITOT 0.9 06/21/2017 1610   BILITOT 0.2 06/18/2017 0825   BILITOT 0.41 10/30/2016 0909          Impression and Plan:  47 year old gentleman with the following issues:  1.  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   He is currently receiving systemic therapy utilizing carboplatin and paclitaxel with Pembrolizumab.  He received the first cycle with few complications.   Risks and benefits of receiving cycle 2 was discussed today and is agreeable to proceed.  The plan is to reduce his paclitaxel by 25% and proceed with carboplatin at AUC of 4.  2.  CNS and spinal metastasis: He continues to follow with radiation oncology for surveillance.    3.  IV access: Port-A-Cath inserted without complications.   4.  Antiemetics: Prescription for Compazine is available to him with instruction to use.  5.  Pain: His pain is rather diffuse including lower back and exacerbated by chemotherapy.  Lowering chemotherapy doses should alleviate some of the side effects.  He has no provider prescribing him any pain medication at this time.  I have agreed to giving him prescription for pain medication while he is receiving chemotherapy to deal with the side effects associated with that.  He understands that if he is receiving pain medication for from different providers or violates the verbal agreement today he will have to receive pain medication somewhere else.  I gave him prescription for oxycodone 50 mg every 4 hours to last for 3 weeks I will refill it for him if he is adherent to this regimen.  6.  Prognosis and goals of care: The goal of care remains palliative at this time and his performance status is adequate and aggressive therapy is warranted.  8. Follow-up: Will be in 3 weeks to receive the third cycle of chemotherapy.  25 minutes was spent with the patient face-to-face today.  More than 50% of time was dedicated to patient counseling, education and outlining his future treatment plan including prescribing pain medication and the importance of adherence to this plan.    Zola Button, MD 4/30/20198:59 AM

## 2017-07-09 NOTE — Patient Instructions (Addendum)
[facility] Discharge Instructions for Patients Receiving Chemotherapy  Today you received the following chemotherapy agents: Keytruda, Taxol, and Carboplatin.   To help prevent nausea and vomiting after your treatment, we encourage you to take your nausea medication as directed.   If you develop nausea and vomiting that is not controlled by your nausea medication, call the clinic.   BELOW ARE SYMPTOMS THAT SHOULD BE REPORTED IMMEDIATELY:  *FEVER GREATER THAN 100.5 F  *CHILLS WITH OR WITHOUT FEVER  NAUSEA AND VOMITING THAT IS NOT CONTROLLED WITH YOUR NAUSEA MEDICATION  *UNUSUAL SHORTNESS OF BREATH  *UNUSUAL BRUISING OR BLEEDING  TENDERNESS IN MOUTH AND THROAT WITH OR WITHOUT PRESENCE OF ULCERS  *URINARY PROBLEMS  *BOWEL PROBLEMS  UNUSUAL RASH Items with * indicate a potential emergency and should be followed up as soon as possible.  Feel free to call the clinic should you have any questions or concerns. The clinic phone number is [phone].  Please show the [facility] at check-in to the Emergency Department and triage nurse.

## 2017-07-09 NOTE — Telephone Encounter (Signed)
Printed avs and calender of upcoming appointment. Per 4/30 los

## 2017-07-11 ENCOUNTER — Inpatient Hospital Stay: Payer: Medicaid Other | Attending: Internal Medicine

## 2017-07-11 VITALS — BP 142/78 | HR 81 | Temp 98.2°F | Resp 17

## 2017-07-11 DIAGNOSIS — Z79899 Other long term (current) drug therapy: Secondary | ICD-10-CM | POA: Insufficient documentation

## 2017-07-11 DIAGNOSIS — G893 Neoplasm related pain (acute) (chronic): Secondary | ICD-10-CM | POA: Diagnosis not present

## 2017-07-11 DIAGNOSIS — C7931 Secondary malignant neoplasm of brain: Secondary | ICD-10-CM | POA: Insufficient documentation

## 2017-07-11 DIAGNOSIS — Z5111 Encounter for antineoplastic chemotherapy: Secondary | ICD-10-CM | POA: Insufficient documentation

## 2017-07-11 DIAGNOSIS — Z923 Personal history of irradiation: Secondary | ICD-10-CM | POA: Insufficient documentation

## 2017-07-11 DIAGNOSIS — Z7689 Persons encountering health services in other specified circumstances: Secondary | ICD-10-CM | POA: Diagnosis not present

## 2017-07-11 DIAGNOSIS — C3490 Malignant neoplasm of unspecified part of unspecified bronchus or lung: Secondary | ICD-10-CM | POA: Insufficient documentation

## 2017-07-11 DIAGNOSIS — C7951 Secondary malignant neoplasm of bone: Secondary | ICD-10-CM | POA: Diagnosis not present

## 2017-07-11 MED ORDER — PEGFILGRASTIM-CBQV 6 MG/0.6ML ~~LOC~~ SOSY
PREFILLED_SYRINGE | SUBCUTANEOUS | Status: AC
  Filled 2017-07-11: qty 0.6

## 2017-07-11 MED ORDER — PEGFILGRASTIM-CBQV 6 MG/0.6ML ~~LOC~~ SOSY
6.0000 mg | PREFILLED_SYRINGE | Freq: Once | SUBCUTANEOUS | Status: AC
  Administered 2017-07-11: 6 mg via SUBCUTANEOUS

## 2017-07-11 NOTE — Patient Instructions (Signed)
Pegfilgrastim injection What is this medicine? PEGFILGRASTIM (PEG fil gra stim) is a long-acting granulocyte colony-stimulating factor that stimulates the growth of neutrophils, a type of white blood cell important in the body's fight against infection. It is used to reduce the incidence of fever and infection in patients with certain types of cancer who are receiving chemotherapy that affects the bone marrow, and to increase survival after being exposed to high doses of radiation. This medicine may be used for other purposes; ask your health care provider or pharmacist if you have questions. COMMON BRAND NAME(S): Neulasta What should I tell my health care provider before I take this medicine? They need to know if you have any of these conditions: -kidney disease -latex allergy -ongoing radiation therapy -sickle cell disease -skin reactions to acrylic adhesives (On-Body Injector only) -an unusual or allergic reaction to pegfilgrastim, filgrastim, other medicines, foods, dyes, or preservatives -pregnant or trying to get pregnant -breast-feeding How should I use this medicine? This medicine is for injection under the skin. If you get this medicine at home, you will be taught how to prepare and give the pre-filled syringe or how to use the On-body Injector. Refer to the patient Instructions for Use for detailed instructions. Use exactly as directed. Tell your healthcare provider immediately if you suspect that the On-body Injector may not have performed as intended or if you suspect the use of the On-body Injector resulted in a missed or partial dose. It is important that you put your used needles and syringes in a special sharps container. Do not put them in a trash can. If you do not have a sharps container, call your pharmacist or healthcare provider to get one. Talk to your pediatrician regarding the use of this medicine in children. While this drug may be prescribed for selected conditions,  precautions do apply. Overdosage: If you think you have taken too much of this medicine contact a poison control center or emergency room at once. NOTE: This medicine is only for you. Do not share this medicine with others. What if I miss a dose? It is important not to miss your dose. Call your doctor or health care professional if you miss your dose. If you miss a dose due to an On-body Injector failure or leakage, a new dose should be administered as soon as possible using a single prefilled syringe for manual use. What may interact with this medicine? Interactions have not been studied. Give your health care provider a list of all the medicines, herbs, non-prescription drugs, or dietary supplements you use. Also tell them if you smoke, drink alcohol, or use illegal drugs. Some items may interact with your medicine. This list may not describe all possible interactions. Give your health care provider a list of all the medicines, herbs, non-prescription drugs, or dietary supplements you use. Also tell them if you smoke, drink alcohol, or use illegal drugs. Some items may interact with your medicine. What should I watch for while using this medicine? You may need blood work done while you are taking this medicine. If you are going to need a MRI, CT scan, or other procedure, tell your doctor that you are using this medicine (On-Body Injector only). What side effects may I notice from receiving this medicine? Side effects that you should report to your doctor or health care professional as soon as possible: -allergic reactions like skin rash, itching or hives, swelling of the face, lips, or tongue -dizziness -fever -pain, redness, or irritation at site   where injected -pinpoint red spots on the skin -red or dark-brown urine -shortness of breath or breathing problems -stomach or side pain, or pain at the shoulder -swelling -tiredness -trouble passing urine or change in the amount of urine Side  effects that usually do not require medical attention (report to your doctor or health care professional if they continue or are bothersome): -bone pain -muscle pain This list may not describe all possible side effects. Call your doctor for medical advice about side effects. You may report side effects to FDA at 1-800-FDA-1088. Where should I keep my medicine? Keep out of the reach of children. Store pre-filled syringes in a refrigerator between 2 and 8 degrees C (36 and 46 degrees F). Do not freeze. Keep in carton to protect from light. Throw away this medicine if it is left out of the refrigerator for more than 48 hours. Throw away any unused medicine after the expiration date. NOTE: This sheet is a summary. It may not cover all possible information. If you have questions about this medicine, talk to your doctor, pharmacist, or health care provider.  2018 [employer]vier/Gold Standard (2016-02-23 12:58:03)  

## 2017-07-29 ENCOUNTER — Other Ambulatory Visit: Payer: Self-pay | Admitting: Oncology

## 2017-07-29 DIAGNOSIS — G893 Neoplasm related pain (acute) (chronic): Secondary | ICD-10-CM

## 2017-07-29 MED ORDER — OXYCODONE HCL 15 MG PO TABS: 15.0000 mg | ORAL_TABLET | ORAL | 0 refills | Status: DC | PRN

## 2017-07-29 MED ORDER — OXYCODONE HCL 15 MG PO TABS: 15.0000 mg | ORAL_TABLET | ORAL | 0 refills | Status: AC | PRN

## 2017-07-30 ENCOUNTER — Encounter: Payer: Self-pay | Admitting: Oncology

## 2017-07-30 ENCOUNTER — Encounter: Payer: Self-pay | Admitting: *Deleted

## 2017-07-30 ENCOUNTER — Inpatient Hospital Stay (HOSPITAL_BASED_OUTPATIENT_CLINIC_OR_DEPARTMENT_OTHER): Payer: Medicaid Other | Admitting: Oncology

## 2017-07-30 ENCOUNTER — Inpatient Hospital Stay: Payer: Medicaid Other

## 2017-07-30 ENCOUNTER — Other Ambulatory Visit: Payer: Self-pay

## 2017-07-30 VITALS — BP 111/83 | HR 82 | Temp 98.6°F | Resp 17 | Ht 70.0 in | Wt 133.5 lb

## 2017-07-30 DIAGNOSIS — Z95828 Presence of other vascular implants and grafts: Secondary | ICD-10-CM

## 2017-07-30 DIAGNOSIS — C3490 Malignant neoplasm of unspecified part of unspecified bronchus or lung: Secondary | ICD-10-CM

## 2017-07-30 DIAGNOSIS — Z79899 Other long term (current) drug therapy: Secondary | ICD-10-CM

## 2017-07-30 DIAGNOSIS — C7931 Secondary malignant neoplasm of brain: Secondary | ICD-10-CM | POA: Diagnosis not present

## 2017-07-30 DIAGNOSIS — Z7689 Persons encountering health services in other specified circumstances: Secondary | ICD-10-CM

## 2017-07-30 DIAGNOSIS — Z5112 Encounter for antineoplastic immunotherapy: Secondary | ICD-10-CM | POA: Insufficient documentation

## 2017-07-30 DIAGNOSIS — Z923 Personal history of irradiation: Secondary | ICD-10-CM

## 2017-07-30 DIAGNOSIS — Z5111 Encounter for antineoplastic chemotherapy: Secondary | ICD-10-CM | POA: Diagnosis not present

## 2017-07-30 DIAGNOSIS — C7951 Secondary malignant neoplasm of bone: Secondary | ICD-10-CM

## 2017-07-30 DIAGNOSIS — G893 Neoplasm related pain (acute) (chronic): Secondary | ICD-10-CM

## 2017-07-30 LAB — CMP (CANCER CENTER ONLY)
ALBUMIN: 3.1 g/dL — AB (ref 3.5–5.0)
ALK PHOS: 102 U/L (ref 40–150)
ALT: 6 U/L (ref 0–55)
ANION GAP: 10 (ref 3–11)
AST: 12 U/L (ref 5–34)
BILIRUBIN TOTAL: 0.2 mg/dL (ref 0.2–1.2)
BUN: 3 mg/dL — ABNORMAL LOW (ref 7–26)
CALCIUM: 8.8 mg/dL (ref 8.4–10.4)
CO2: 22 mmol/L (ref 22–29)
CREATININE: 0.77 mg/dL (ref 0.70–1.30)
Chloride: 106 mmol/L (ref 98–109)
GFR, Estimated: >60 mL/min (ref >60)
Glucose, Bld: 97 mg/dL (ref 70–140)
Potassium: 3.4 mmol/L — ABNORMAL LOW (ref 3.5–5.1)
SODIUM: 138 mmol/L (ref 136–145)
TOTAL PROTEIN: 7.4 g/dL (ref 6.4–8.3)

## 2017-07-30 LAB — CBC WITH DIFFERENTIAL (CANCER CENTER ONLY)
BASOS PCT: 0 %
Basophils Absolute: 0 10*3/uL (ref 0.0–0.1)
EOS ABS: 0 10*3/uL (ref 0.0–0.5)
Eosinophils Relative: 0 %
HCT: 30.7 % — ABNORMAL LOW (ref 38.4–49.9)
Hemoglobin: 10 g/dL — ABNORMAL LOW (ref 13.0–17.1)
Lymphocytes Relative: 39 %
Lymphs Abs: 2.4 10*3/uL (ref 0.9–3.3)
MCH: 28.7 pg (ref 27.2–33.4)
MCHC: 32.7 g/dL (ref 32.0–36.0)
MCV: 87.6 fL (ref 79.3–98.0)
MONO ABS: 0.8 10*3/uL (ref 0.1–0.9)
MONOS PCT: 13 %
NEUTROS PCT: 48 %
Neutro Abs: 2.9 10*3/uL (ref 1.5–6.5)
Platelet Count: 441 10*3/uL — ABNORMAL HIGH (ref 140–400)
RBC: 3.5 MIL/uL — ABNORMAL LOW (ref 4.20–5.82)
RDW: 18.4 % — AB (ref 11.0–14.6)
WBC Count: 6.2 10*3/uL (ref 4.0–10.3)

## 2017-07-30 MED ORDER — SODIUM CHLORIDE 0.9 % IV SOLN
Freq: Once | INTRAVENOUS | Status: AC
  Administered 2017-07-30: 10:00:00 via INTRAVENOUS

## 2017-07-30 MED ORDER — PACLITAXEL CHEMO INJECTION 300 MG/50ML
130.0000 mg/m2 | Freq: Once | INTRAVENOUS | Status: AC
  Administered 2017-07-30: 228 mg via INTRAVENOUS
  Filled 2017-07-30: qty 38

## 2017-07-30 MED ORDER — FAMOTIDINE IN NACL 20-0.9 MG/50ML-% IV SOLN
INTRAVENOUS | Status: AC
  Filled 2017-07-30: qty 50

## 2017-07-30 MED ORDER — DIPHENHYDRAMINE HCL 50 MG/ML IJ SOLN
50.0000 mg | Freq: Once | INTRAMUSCULAR | Status: AC
  Administered 2017-07-30: 50 mg via INTRAVENOUS

## 2017-07-30 MED ORDER — SODIUM CHLORIDE 0.9 % IV SOLN
200.0000 mg | Freq: Once | INTRAVENOUS | Status: AC
  Administered 2017-07-30: 200 mg via INTRAVENOUS
  Filled 2017-07-30: qty 8

## 2017-07-30 MED ORDER — SODIUM CHLORIDE 0.9 % IV SOLN
507.6000 mg | Freq: Once | INTRAVENOUS | Status: AC
  Administered 2017-07-30: 510 mg via INTRAVENOUS
  Filled 2017-07-30: qty 51

## 2017-07-30 MED ORDER — SODIUM CHLORIDE 0.9 % IV SOLN
Freq: Once | INTRAVENOUS | Status: AC
  Administered 2017-07-30: 11:00:00 via INTRAVENOUS
  Filled 2017-07-30: qty 5

## 2017-07-30 MED ORDER — PALONOSETRON HCL INJECTION 0.25 MG/5ML
INTRAVENOUS | Status: AC
  Filled 2017-07-30: qty 5

## 2017-07-30 MED ORDER — SODIUM CHLORIDE 0.9% FLUSH
10.0000 mL | INTRAVENOUS | Status: DC | PRN
  Administered 2017-07-30: 10 mL
  Filled 2017-07-30: qty 10

## 2017-07-30 MED ORDER — DIPHENHYDRAMINE HCL 50 MG/ML IJ SOLN
INTRAMUSCULAR | Status: AC
Start: 2017-07-30 — End: ?
  Filled 2017-07-30: qty 1

## 2017-07-30 MED ORDER — FAMOTIDINE IN NACL 20-0.9 MG/50ML-% IV SOLN
20.0000 mg | Freq: Once | INTRAVENOUS | Status: AC
  Administered 2017-07-30: 20 mg via INTRAVENOUS

## 2017-07-30 MED ORDER — SODIUM CHLORIDE 0.9% FLUSH
10.0000 mL | Freq: Once | INTRAVENOUS | Status: AC
  Administered 2017-07-30: 10 mL
  Filled 2017-07-30: qty 10

## 2017-07-30 MED ORDER — HEPARIN SOD (PORK) LOCK FLUSH 100 UNIT/ML IV SOLN
500.0000 [IU] | Freq: Once | INTRAVENOUS | Status: AC | PRN
  Administered 2017-07-30: 500 [IU]
  Filled 2017-07-30: qty 5

## 2017-07-30 MED ORDER — PALONOSETRON HCL INJECTION 0.25 MG/5ML
0.2500 mg | Freq: Once | INTRAVENOUS | Status: AC
  Administered 2017-07-30: 0.25 mg via INTRAVENOUS

## 2017-07-30 NOTE — Patient Instructions (Signed)
[facility] Discharge Instructions for Patients Receiving Chemotherapy  Today you received the following chemotherapy agents Keytruda, Taxol and Carboplatin   To help prevent nausea and vomiting after your treatment, we encourage you to take your nausea medication as directed. No Zofran for 3 days. Take Compazine instead.    If you develop nausea and vomiting that is not controlled by your nausea medication, call the clinic.   BELOW ARE SYMPTOMS THAT SHOULD BE REPORTED IMMEDIATELY:  *FEVER GREATER THAN 100.5 F  *CHILLS WITH OR WITHOUT FEVER  NAUSEA AND VOMITING THAT IS NOT CONTROLLED WITH YOUR NAUSEA MEDICATION  *UNUSUAL SHORTNESS OF BREATH  *UNUSUAL BRUISING OR BLEEDING  TENDERNESS IN MOUTH AND THROAT WITH OR WITHOUT PRESENCE OF ULCERS  *URINARY PROBLEMS  *BOWEL PROBLEMS  UNUSUAL RASH Items with * indicate a potential emergency and should be followed up as soon as possible.  Feel free to call the clinic should you have any questions or concerns. The clinic phone number is [phone].  Please show the [facility] at check-in to the Emergency Department and triage nurse.

## 2017-07-30 NOTE — Progress Notes (Signed)
Hematology and Oncology Follow Up Visit  Eric Mason [id number] 04-22-1970 47 y.o. 07/30/2017 7:59 AM [name], [name], Eric Batman, MD   Principle Diagnosis: 47 year old with advanced malignancy arising from the lung.  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  He was initially diagnosed in May 2018.    Prior Therapy:   Status post craniotomy and resection in May 2018. This was followed by [facility] completed in June 2018.  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   Current therapy: Carboplatin and paclitaxel with Pembrolizumab cycle 1 given on 06/18/2017.  Status post 2 cycles.  Interim History: Eric Mason presents today for a follow-up.  The patient tolerated his last cycle of chemotherapy better.  The dose of carboplatin and paclitaxel reduced starting with cycle #2.  The patient denied fevers and chills.  Denies chest pain, shortness of breath, cough, hemoptysis.  Denies nausea, vomiting, constipation, diarrhea.  Reports that his pain is well controlled at this time with oxycodone.  He denies any headaches blurred vision, syncope or seizures. He does not report any frequency urgency or hesitancy.  He denies any pathological fractures or bone pain.  He does not report any skin rashes or lesions.  He does not report any lymphadenopathy or petechiae.  He denies any heat or cold intolerance.  He denies any anxiety or depression.  Remaining review of systems is negative.  The patient is here for evaluation prior to cycle #3 of his treatment.   Medications: I have reviewed the patient's current medications.  Current Outpatient Medications  Medication Sig Dispense Refill  . albuterol (PROVENTIL HFA;VENTOLIN HFA) 108 (90 Base) MCG/ACT inhaler Inhale 2 puffs into the lungs every 6 (six) hours as needed for  wheezing or shortness of breath. 1 Inhaler 6  . amitriptyline (ELAVIL) 25 MG tablet Take 25 mg by mouth at bedtime.    [name] amoxicillin-clavulanate (AUGMENTIN) 875-125 MG tablet Take 1 tablet by mouth 2 (two) times daily. (Patient not taking: Reported on 06/21/2017) 10 tablet 1  . B Complex-C (B-COMPLEX WITH VITAMIN C) tablet Take 1 tablet by mouth daily.    . Calcium Carb-Cholecalciferol (CALCIUM 500 +D) 500-400 MG-UNIT TABS 1 tab PO BID 60 tablet 2  . dexamethasone (DECADRON) 4 MG tablet Take 1 tablet (4 mg total) by mouth 2 (two) times daily. (Patient not taking: Reported on 06/21/2017) 40 tablet 1  . DULoxetine (CYMBALTA) 30 MG capsule Take 1 capsule (30 mg total) by mouth daily. 30 capsule 5  . fluconazole (DIFLUCAN) 100 MG tablet Take 1 tablet (100 mg total) by mouth daily. Take 2 tablets on day 1, then one tablet daily thereafter. (Patient not taking: Reported on 06/21/2017) 8 tablet 0  . folic acid (FOLVITE) 1 MG tablet Take 1 tablet (1 mg total) by mouth daily. 100 tablet 1  . gabapentin (NEURONTIN) 600 MG tablet Take 1 tablet (600 mg total) by mouth 3 (three) times daily. 90 tablet 2  . levETIRAcetam (KEPPRA) 1000 MG tablet Take 1 tablet (1,000 mg total) by mouth 2 (two) times daily. 60 tablet 5  . lidocaine-prilocaine (EMLA) cream Apply 1 application topically as needed. (Patient not taking: Reported on 06/21/2017) 30 g 0  . Multiple Vitamin (MULTIVITAMIN) tablet Take 1 tablet by mouth daily.    . nicotine (NICODERM CQ - DOSED IN MG/24 HOURS) 21 mg/24hr patch  Place 1 patch (21 mg total) onto the skin daily. (Patient not taking: Reported on 06/21/2017) 28 patch 3  . ondansetron (ZOFRAN ODT) 4 MG disintegrating tablet Take 1 tablet (4 mg total) by mouth every 8 (eight) hours as needed for nausea or vomiting. 12 tablet 0  . ondansetron (ZOFRAN) 8 MG tablet Take 1 tablet (8 mg total) by mouth every 8 (eight) hours as needed for nausea or vomiting. (Patient not taking: Reported on 06/26/2017) 30 tablet 3   . oxyCODONE (ROXICODONE) 15 MG immediate release tablet Take 1 tablet (15 mg total) by mouth every 4 (four) hours as needed for up to 21 days for pain. 180 tablet 0  . pantoprazole (PROTONIX) 40 MG tablet Take 1 tablet (40 mg total) by mouth 2 (two) times daily. 60 tablet 0  . prochlorperazine (COMPAZINE) 10 MG tablet Take 1 tablet (10 mg total) by mouth every 6 (six) hours as needed for nausea or vomiting. 30 tablet 0  . ranolazine (RANEXA) 500 MG 12 hr tablet Take 1 tablet (500 mg total) by mouth 2 (two) times daily. 60 tablet 11  . senna-docusate (SENNA S) 8.6-50 MG tablet Take 1 tablet by mouth daily. (Patient not taking: Reported on 04/29/2017) 30 tablet 3  . thiamine (VITAMIN B-1) 100 MG tablet Take 1 tablet (100 mg total) by mouth daily. 100 tablet 0   No current facility-administered medications for this visit.    Facility-Administered Medications Ordered in Other Visits  Medication Dose Route Frequency Provider Last Rate Last Dose  . sodium chloride flush (NS) 0.9 % injection 10 mL  10 mL Intracatheter PRN Wyatt Portela, MD   10 mL at 06/18/17 1815     Allergies:  Allergies  Allergen Reactions  . Latex     UNSPECIFIED REACTION     Past Medical History, Surgical history, Social history, and Family History reviewed today and remain unchanged.  Physical Exam: Blood pressure 111/83, pulse 82, temperature 98.6 F (37 C), temperature source Oral, resp. rate 17, height 5\' 10"  (1.778 m), weight 133 lb 8 oz (60.6 kg), SpO2 100 %.  ECOG: 1 General appearance: Alert, awake gentleman without distress. Head: Normocephalic without abnormalities. Oropharynx: No oral thrush or ulcers. Eyes: Pupils are equal and round reactive to light. Lymph nodes: No lymphadenopathy noted cervical, supraclavicular, and axillary regions. Heart: Regular rate and rhythm without any murmurs or gallops. Lung: Clear to auscultation without any rhonchi, wheezes or dullness to percussion. Abdomin: Soft,  nontender without any rebound or guarding. Musculoskeletal: No joint deformity or effusion. Neurological: Intact motor, sensory exam without any deficits Skin: Warm and dry.  No rashes.   Lab Results: Lab Results  Component Value Date   WBC 6.8 07/09/2017   HGB 10.9 (L) 07/09/2017   HCT 33.5 (L) 07/09/2017   MCV 86.2 07/09/2017   PLT 362 07/09/2017     Chemistry      Component Value Date/Time   NA 138 07/09/2017 0847   NA 137 10/30/2016 0909   K 3.4 (L) 07/09/2017 0847   K 3.3 (L) 10/30/2016 0909   CL 104 07/09/2017 0847   CO2 25 07/09/2017 0847   CO2 27 10/30/2016 0909   BUN 5 (L) 07/09/2017 0847   BUN 4.8 (L) 10/30/2016 0909   CREATININE 0.83 07/09/2017 0847   CREATININE 0.9 10/30/2016 0909      Component Value Date/Time   CALCIUM 8.8 07/09/2017 0847   CALCIUM 9.3 10/30/2016 0909   ALKPHOS 93 07/09/2017 0847  ALKPHOS 93 10/30/2016 0909   AST 14 07/09/2017 0847   AST 35 (H) 10/30/2016 0909   ALT 9 07/09/2017 0847   ALT 25 10/30/2016 0909   BILITOT 0.3 07/09/2017 0847   BILITOT 0.41 10/30/2016 0909          Impression and Plan:  47 year old gentleman with the following issues:  1.  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   He is currently receiving systemic therapy utilizing carboplatin and paclitaxel with Pembrolizumab.  He received the first cycle with few complications.  Starting with cycle #2 his paclitaxel by 25% and proceed with carboplatin at AUC of 4.  He tolerated his second cycle much better.  Labs reviewed.  Recommend that he proceed with cycle 3 of his treatment as scheduled today.  2.  CNS and spinal metastasis: He continues to follow with radiation oncology for surveillance.    3.  IV access: Port-A-Cath inserted without complications.   4.  Antiemetics: Prescription for Compazine is available to him with instruction to use.  5.  Pain:  His pain is rather diffuse including lower back and exacerbated by chemotherapy.  The lower dose of chemotherapy seems to have improved his back pain.  He is using oxycodone as needed.  The patient did not require refill of his pain medication today.  6.  Prognosis and goals of care: The goal of care remains palliative at this time and his performance status is adequate and aggressive therapy is warranted.  8. Follow-up: Will be in 3 weeks to receive the fourth cycle of chemotherapy.  Mikey Bussing, DNP, AGPCNP-BC, AOCNP 5/21/20197:59 AM

## 2017-07-30 NOTE — Patient Instructions (Signed)
Implanted Port Home Guide An implanted port is a type of central line that is placed under the skin. Central lines are used to provide IV access when treatment or nutrition needs to be given through a person's veins. Implanted ports are used for long-term IV access. An implanted port may be placed because:  You need IV medicine that would be irritating to the small veins in your hands or arms.  You need long-term IV medicines, such as antibiotics.  You need IV nutrition for a long period.  You need frequent blood draws for lab tests.  You need dialysis.  Implanted ports are usually placed in the chest area, but they can also be placed in the upper arm, the abdomen, or the leg. An implanted port has two main parts:  Reservoir. The reservoir is round and will appear as a small, raised area under your skin. The reservoir is the part where a needle is inserted to give medicines or draw blood.  Catheter. The catheter is a thin, flexible tube that extends from the reservoir. The catheter is placed into a large vein. Medicine that is inserted into the reservoir goes into the catheter and then into the vein.  How will I care for my incision site? Do not get the incision site wet. Bathe or shower as directed by your health care provider. How is my port accessed? Special steps must be taken to access the port:  Before the port is accessed, a numbing cream can be placed on the skin. This helps numb the skin over the port site.  Your health care provider uses a sterile technique to access the port. ? Your health care provider must put on a mask and sterile gloves. ? The skin over your port is cleaned carefully with an antiseptic and allowed to dry. ? The port is gently pinched between sterile gloves, and a needle is inserted into the port.  Only "non-coring" port needles should be used to access the port. Once the port is accessed, a blood return should be checked. This helps ensure that the port  is in the vein and is not clogged.  If your port needs to remain accessed for a constant infusion, a clear (transparent) bandage will be placed over the needle site. The bandage and needle will need to be changed every week, or as directed by your health care provider.  Keep the bandage covering the needle clean and dry. Do not get it wet. Follow your health care provider's instructions on how to take a shower or bath while the port is accessed.  If your port does not need to stay accessed, no bandage is needed over the port.  What is flushing? Flushing helps keep the port from getting clogged. Follow your health care provider's instructions on how and when to flush the port. Ports are usually flushed with saline solution or a medicine called heparin. The need for flushing will depend on how the port is used.  If the port is used for intermittent medicines or blood draws, the port will need to be flushed: ? After medicines have been given. ? After blood has been drawn. ? As part of routine maintenance.  If a constant infusion is running, the port may not need to be flushed.  How long will my port stay implanted? The port can stay in for as long as your health care provider thinks it is needed. When it is time for the port to come out, surgery will be   done to remove it. The procedure is similar to the one performed when the port was put in. When should I seek immediate medical care? When you have an implanted port, you should seek immediate medical care if:  You notice a bad smell coming from the incision site.  You have swelling, redness, or drainage at the incision site.  You have more swelling or pain at the port site or the surrounding area.  You have a fever that is not controlled with medicine.  This information is not intended to replace advice given to you by your health care provider. Make sure you discuss any questions you have with your health care provider. Document  Released: 02/26/2005 Document Revised: 08/04/2015 Document Reviewed: 11/03/2012 Elsevier Interactive Patient Education  2017 [employer]  

## 2017-08-01 ENCOUNTER — Inpatient Hospital Stay: Payer: Medicaid Other

## 2017-08-01 DIAGNOSIS — Z5111 Encounter for antineoplastic chemotherapy: Secondary | ICD-10-CM | POA: Diagnosis not present

## 2017-08-01 DIAGNOSIS — C3490 Malignant neoplasm of unspecified part of unspecified bronchus or lung: Secondary | ICD-10-CM

## 2017-08-01 MED ORDER — PEGFILGRASTIM-CBQV 6 MG/0.6ML ~~LOC~~ SOSY
PREFILLED_SYRINGE | SUBCUTANEOUS | Status: AC
  Filled 2017-08-01: qty 0.6

## 2017-08-01 MED ORDER — PEGFILGRASTIM-CBQV 6 MG/0.6ML ~~LOC~~ SOSY
6.0000 mg | PREFILLED_SYRINGE | Freq: Once | SUBCUTANEOUS | Status: AC
  Administered 2017-08-01: 6 mg via SUBCUTANEOUS

## 2017-08-01 NOTE — Patient Instructions (Signed)
Pegfilgrastim injection What is this medicine? PEGFILGRASTIM (PEG fil gra stim) is a long-acting granulocyte colony-stimulating factor that stimulates the growth of neutrophils, a type of white blood cell important in the body's fight against infection. It is used to reduce the incidence of fever and infection in patients with certain types of cancer who are receiving chemotherapy that affects the bone marrow, and to increase survival after being exposed to high doses of radiation. This medicine may be used for other purposes; ask your health care provider or pharmacist if you have questions. COMMON BRAND NAME(S): Neulasta What should I tell my health care provider before I take this medicine? They need to know if you have any of these conditions: -kidney disease -latex allergy -ongoing radiation therapy -sickle cell disease -skin reactions to acrylic adhesives (On-Body Injector only) -an unusual or allergic reaction to pegfilgrastim, filgrastim, other medicines, foods, dyes, or preservatives -pregnant or trying to get pregnant -breast-feeding How should I use this medicine? This medicine is for injection under the skin. If you get this medicine at home, you will be taught how to prepare and give the pre-filled syringe or how to use the On-body Injector. Refer to the patient Instructions for Use for detailed instructions. Use exactly as directed. Tell your healthcare provider immediately if you suspect that the On-body Injector may not have performed as intended or if you suspect the use of the On-body Injector resulted in a missed or partial dose. It is important that you put your used needles and syringes in a special sharps container. Do not put them in a trash can. If you do not have a sharps container, call your pharmacist or healthcare provider to get one. Talk to your pediatrician regarding the use of this medicine in children. While this drug may be prescribed for selected conditions,  precautions do apply. Overdosage: If you think you have taken too much of this medicine contact a poison control center or emergency room at once. NOTE: This medicine is only for you. Do not share this medicine with others. What if I miss a dose? It is important not to miss your dose. Call your doctor or health care professional if you miss your dose. If you miss a dose due to an On-body Injector failure or leakage, a new dose should be administered as soon as possible using a single prefilled syringe for manual use. What may interact with this medicine? Interactions have not been studied. Give your health care provider a list of all the medicines, herbs, non-prescription drugs, or dietary supplements you use. Also tell them if you smoke, drink alcohol, or use illegal drugs. Some items may interact with your medicine. This list may not describe all possible interactions. Give your health care provider a list of all the medicines, herbs, non-prescription drugs, or dietary supplements you use. Also tell them if you smoke, drink alcohol, or use illegal drugs. Some items may interact with your medicine. What should I watch for while using this medicine? You may need blood work done while you are taking this medicine. If you are going to need a MRI, CT scan, or other procedure, tell your doctor that you are using this medicine (On-Body Injector only). What side effects may I notice from receiving this medicine? Side effects that you should report to your doctor or health care professional as soon as possible: -allergic reactions like skin rash, itching or hives, swelling of the face, lips, or tongue -dizziness -fever -pain, redness, or irritation at site   where injected -pinpoint red spots on the skin -red or dark-brown urine -shortness of breath or breathing problems -stomach or side pain, or pain at the shoulder -swelling -tiredness -trouble passing urine or change in the amount of urine Side  effects that usually do not require medical attention (report to your doctor or health care professional if they continue or are bothersome): -bone pain -muscle pain This list may not describe all possible side effects. Call your doctor for medical advice about side effects. You may report side effects to FDA at 1-800-FDA-1088. Where should I keep my medicine? Keep out of the reach of children. Store pre-filled syringes in a refrigerator between 2 and 8 degrees C (36 and 46 degrees F). Do not freeze. Keep in carton to protect from light. Throw away this medicine if it is left out of the refrigerator for more than 48 hours. Throw away any unused medicine after the expiration date. NOTE: This sheet is a summary. It may not cover all possible information. If you have questions about this medicine, talk to your doctor, pharmacist, or health care provider.  2018 [employer]vier/Gold Standard (2016-02-23 12:58:03)  

## 2017-08-12 ENCOUNTER — Other Ambulatory Visit: Payer: Self-pay | Admitting: *Deleted

## 2017-08-15 ENCOUNTER — Other Ambulatory Visit: Payer: Self-pay | Admitting: Radiation Therapy

## 2017-08-15 DIAGNOSIS — C7949 Secondary malignant neoplasm of other parts of nervous system: Principal | ICD-10-CM

## 2017-08-15 DIAGNOSIS — C7931 Secondary malignant neoplasm of brain: Secondary | ICD-10-CM

## 2017-08-20 ENCOUNTER — Inpatient Hospital Stay: Payer: Medicaid Other

## 2017-08-20 ENCOUNTER — Inpatient Hospital Stay: Payer: Medicaid Other | Attending: Internal Medicine | Admitting: Oncology

## 2017-08-20 ENCOUNTER — Telehealth: Payer: Self-pay | Admitting: Oncology

## 2017-08-20 VITALS — BP 133/79 | HR 74 | Temp 98.2°F | Resp 17 | Ht 70.0 in | Wt 127.8 lb

## 2017-08-20 DIAGNOSIS — C7931 Secondary malignant neoplasm of brain: Secondary | ICD-10-CM | POA: Diagnosis not present

## 2017-08-20 DIAGNOSIS — Z923 Personal history of irradiation: Secondary | ICD-10-CM | POA: Diagnosis not present

## 2017-08-20 DIAGNOSIS — Z5111 Encounter for antineoplastic chemotherapy: Secondary | ICD-10-CM | POA: Diagnosis present

## 2017-08-20 DIAGNOSIS — G893 Neoplasm related pain (acute) (chronic): Secondary | ICD-10-CM | POA: Diagnosis not present

## 2017-08-20 DIAGNOSIS — Z95828 Presence of other vascular implants and grafts: Secondary | ICD-10-CM

## 2017-08-20 DIAGNOSIS — C7951 Secondary malignant neoplasm of bone: Secondary | ICD-10-CM | POA: Diagnosis not present

## 2017-08-20 DIAGNOSIS — C3490 Malignant neoplasm of unspecified part of unspecified bronchus or lung: Secondary | ICD-10-CM

## 2017-08-20 DIAGNOSIS — Z7689 Persons encountering health services in other specified circumstances: Secondary | ICD-10-CM | POA: Diagnosis not present

## 2017-08-20 DIAGNOSIS — Z79899 Other long term (current) drug therapy: Secondary | ICD-10-CM

## 2017-08-20 LAB — CBC WITH DIFFERENTIAL (CANCER CENTER ONLY)
Basophils Absolute: 0 K/uL (ref 0.0–0.1)
Basophils Relative: 1 %
Eosinophils Absolute: 0.1 K/uL (ref 0.0–0.5)
Eosinophils Relative: 1 %
HCT: 31.4 % — ABNORMAL LOW (ref 38.4–49.9)
Hemoglobin: 10.3 g/dL — ABNORMAL LOW (ref 13.0–17.1)
Lymphocytes Relative: 46 %
Lymphs Abs: 2.6 K/uL (ref 0.9–3.3)
MCH: 28.4 pg (ref 27.2–33.4)
MCHC: 33 g/dL (ref 32.0–36.0)
MCV: 86.2 fL (ref 79.3–98.0)
Monocytes Absolute: 0.6 K/uL (ref 0.1–0.9)
Monocytes Relative: 10 %
Neutro Abs: 2.4 K/uL (ref 1.5–6.5)
Neutrophils Relative %: 42 %
Platelet Count: 343 K/uL (ref 140–400)
RBC: 3.64 MIL/uL — ABNORMAL LOW (ref 4.20–5.82)
RDW: 18.2 % — ABNORMAL HIGH (ref 11.0–14.6)
WBC Count: 5.7 K/uL (ref 4.0–10.3)

## 2017-08-20 LAB — CMP (CANCER CENTER ONLY)
ALT: 7 U/L (ref 0–55)
AST: 15 U/L (ref 5–34)
Albumin: 3.4 g/dL — ABNORMAL LOW (ref 3.5–5.0)
Alkaline Phosphatase: 106 U/L (ref 40–150)
Anion gap: 10 (ref 3–11)
BUN: 4 mg/dL — ABNORMAL LOW (ref 7–26)
CHLORIDE: 103 mmol/L (ref 98–109)
CO2: 25 mmol/L (ref 22–29)
Calcium: 9.3 mg/dL (ref 8.4–10.4)
Creatinine: 0.76 mg/dL (ref 0.70–1.30)
Glucose, Bld: 79 mg/dL (ref 70–140)
POTASSIUM: 3.8 mmol/L (ref 3.5–5.1)
SODIUM: 138 mmol/L (ref 136–145)
Total Bilirubin: 0.2 mg/dL (ref 0.2–1.2)
Total Protein: 7.5 g/dL (ref 6.4–8.3)

## 2017-08-20 MED ORDER — SODIUM CHLORIDE 0.9 % IV SOLN
130.0000 mg/m2 | Freq: Once | INTRAVENOUS | Status: AC
  Administered 2017-08-20: 228 mg via INTRAVENOUS
  Filled 2017-08-20: qty 38

## 2017-08-20 MED ORDER — SODIUM CHLORIDE 0.9 % IV SOLN
Freq: Once | INTRAVENOUS | Status: AC
  Administered 2017-08-20: 11:00:00 via INTRAVENOUS

## 2017-08-20 MED ORDER — SODIUM CHLORIDE 0.9 % IV SOLN
500.0000 mg | Freq: Once | INTRAVENOUS | Status: AC
  Administered 2017-08-20: 500 mg via INTRAVENOUS
  Filled 2017-08-20: qty 50

## 2017-08-20 MED ORDER — MEGESTROL ACETATE 400 MG/10ML PO SUSP: 400.0000 mg | Freq: Two times a day (BID) | ORAL | 0 refills | Status: DC

## 2017-08-20 MED ORDER — FOSAPREPITANT DIMEGLUMINE INJECTION 150 MG
Freq: Once | INTRAVENOUS | Status: AC
  Administered 2017-08-20: 11:00:00 via INTRAVENOUS
  Filled 2017-08-20: qty 5

## 2017-08-20 MED ORDER — FAMOTIDINE IN NACL 20-0.9 MG/50ML-% IV SOLN
20.0000 mg | Freq: Once | INTRAVENOUS | Status: AC
  Administered 2017-08-20: 20 mg via INTRAVENOUS

## 2017-08-20 MED ORDER — PALONOSETRON HCL INJECTION 0.25 MG/5ML
INTRAVENOUS | Status: AC
  Filled 2017-08-20: qty 5

## 2017-08-20 MED ORDER — SODIUM CHLORIDE 0.9% FLUSH
10.0000 mL | INTRAVENOUS | Status: DC | PRN
  Administered 2017-08-20: 10 mL
  Filled 2017-08-20: qty 10

## 2017-08-20 MED ORDER — FAMOTIDINE IN NACL 20-0.9 MG/50ML-% IV SOLN
INTRAVENOUS | Status: AC
  Filled 2017-08-20: qty 50

## 2017-08-20 MED ORDER — DIPHENHYDRAMINE HCL 50 MG/ML IJ SOLN
50.0000 mg | Freq: Once | INTRAMUSCULAR | Status: AC
  Administered 2017-08-20: 50 mg via INTRAVENOUS

## 2017-08-20 MED ORDER — OXYCODONE HCL 15 MG PO TABS: 15.0000 mg | ORAL_TABLET | ORAL | 0 refills | Status: DC | PRN

## 2017-08-20 MED ORDER — SODIUM CHLORIDE 0.9% FLUSH
10.0000 mL | Freq: Once | INTRAVENOUS | Status: AC
  Administered 2017-08-20: 10 mL
  Filled 2017-08-20: qty 10

## 2017-08-20 MED ORDER — HEPARIN SOD (PORK) LOCK FLUSH 100 UNIT/ML IV SOLN
500.0000 [IU] | Freq: Once | INTRAVENOUS | Status: AC | PRN
  Administered 2017-08-20: 500 [IU]
  Filled 2017-08-20: qty 5

## 2017-08-20 MED ORDER — DIPHENHYDRAMINE HCL 50 MG/ML IJ SOLN
INTRAMUSCULAR | Status: AC
  Filled 2017-08-20: qty 1

## 2017-08-20 MED ORDER — SODIUM CHLORIDE 0.9 % IV SOLN
200.0000 mg | Freq: Once | INTRAVENOUS | Status: AC
  Administered 2017-08-20: 200 mg via INTRAVENOUS
  Filled 2017-08-20: qty 8

## 2017-08-20 MED ORDER — PALONOSETRON HCL INJECTION 0.25 MG/5ML
0.2500 mg | Freq: Once | INTRAVENOUS | Status: AC
  Administered 2017-08-20: 0.25 mg via INTRAVENOUS

## 2017-08-20 NOTE — Telephone Encounter (Signed)
Scheduled appt per 6/11 los. - unable to schedule next treatment due to cap - logged - will contact pt when appt added.

## 2017-08-20 NOTE — Progress Notes (Signed)
I faxed a letter to the [place] the patient from court due to having active chemotherapy treatments on 6/11 and 08/21/17. Faxed to [phone]. Fax number provided by patient .

## 2017-08-20 NOTE — Patient Instructions (Signed)
[facility] Discharge Instructions for Patients Receiving Chemotherapy  Today you received the following chemotherapy agents Keytruda, Taxol and Carboplatin   To help prevent nausea and vomiting after your treatment, we encourage you to take your nausea medication as directed. No Zofran for 3 days. Take Compazine instead.    If you develop nausea and vomiting that is not controlled by your nausea medication, call the clinic.   BELOW ARE SYMPTOMS THAT SHOULD BE REPORTED IMMEDIATELY:  *FEVER GREATER THAN 100.5 F  *CHILLS WITH OR WITHOUT FEVER  NAUSEA AND VOMITING THAT IS NOT CONTROLLED WITH YOUR NAUSEA MEDICATION  *UNUSUAL SHORTNESS OF BREATH  *UNUSUAL BRUISING OR BLEEDING  TENDERNESS IN MOUTH AND THROAT WITH OR WITHOUT PRESENCE OF ULCERS  *URINARY PROBLEMS  *BOWEL PROBLEMS  UNUSUAL RASH Items with * indicate a potential emergency and should be followed up as soon as possible.  Feel free to call the clinic should you have any questions or concerns. The clinic phone number is [phone].  Please show the [place] at check-in to the Emergency Department and triage nurse.

## 2017-08-20 NOTE — Progress Notes (Signed)
Hematology and Oncology Follow Up Visit  Eric Mason [id number] [birth year] 47 y.o. 08/20/2017 9:24 AM Eric Mason, [name], Eric Batman, MD   Principle Diagnosis: 47 year old with poorly differentiated neoplasm of the lung with metastatic disease to the brain and spine diagnosed in May 2018.    Prior Therapy:   Status post craniotomy and resection in May 2018. This was followed by [facility] completed in June 2018.  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   Current therapy: Carboplatin and paclitaxel with Pembrolizumab cycle 1 given on 06/18/2017.  He is here for cycle 4 of therapy of planned 6 cycles.  Interim History: Mr. Forehand presents today for a follow-up visit.  Since last visit, he received the last cycle of chemotherapy without any major complications after dose reduction of his carboplatin and paclitaxel.  He reports no major complications but does report mild nausea and fatigue for 24 to 48 hours after each cycle of chemotherapy.  He denies any worsening neuropathy or hospitalization related to it.  Appetite have also decreased and of lost more weight since the last visit.  His pain has been manageable currently using oxycodone every 4 hours around-the-clock to control his pain.  His pain has been overall manageable.  His pain is predominantly in the spine and exacerbated by malignancy.  He denies any headaches blurred vision, syncope or seizures.  He denies any alteration in mental status or confusion.  He does not report any any fevers or chills or sweats.  He denies any cough, wheezing or hemoptysis.  He denies any shortness of breath or dyspnea on exertion.  He denies any chest pain, palpitation, orthopnea or leg edema. He is not reporting nausea, vomiting or abdominal pain.  He denies any early satiety or change in his bowel habits.  He does not  report any frequency urgency or hesitancy.  He denies any pathological fractures or bone pain.  He does not report any skin rashes or lesions.  He does not report any lymphadenopathy or petechiae.  He denies any heat or cold intolerance.  Denies any easy bruising or bleeding.  Remaining review of systems is negative.    Medications: I have reviewed the patient's current medications.  Current Outpatient Medications  Medication Sig Dispense Refill  . albuterol (PROVENTIL HFA;VENTOLIN HFA) 108 (90 Base) MCG/ACT inhaler Inhale 2 puffs into the lungs every 6 (six) hours as needed for wheezing or shortness of breath. 1 Inhaler 6  . amitriptyline (ELAVIL) 25 MG tablet Take 25 mg by mouth at bedtime.    . B Complex-C (B-COMPLEX WITH VITAMIN C) tablet Take 1 tablet by mouth daily.    . Calcium Carb-Cholecalciferol (CALCIUM 500 +D) 500-400 MG-UNIT TABS 1 tab PO BID 60 tablet 2  . dexamethasone (DECADRON) 4 MG tablet Take 1 tablet (4 mg total) by mouth 2 (two) times daily. 40 tablet 1  . DULoxetine (CYMBALTA) 30 MG capsule Take 1 capsule (30 mg total) by mouth daily. 30 capsule 5  . folic acid (FOLVITE) 1 MG tablet Take 1 tablet (1 mg total) by mouth daily. 100 tablet 1  . gabapentin (NEURONTIN) 600 MG tablet Take 1 tablet (600 mg total) by mouth 3 (three) times daily. 90 tablet 2  . levETIRAcetam (KEPPRA) 1000 MG tablet Take 1 tablet (1,000 mg total) by mouth 2 (two) times daily. 60 tablet 5  . lidocaine-prilocaine (EMLA) cream Apply 1 application  topically as needed. 30 g 0  . Multiple Vitamin (MULTIVITAMIN) tablet Take 1 tablet by mouth daily.    . nicotine (NICODERM CQ - DOSED IN MG/24 HOURS) 21 mg/24hr patch Place 1 patch (21 mg total) onto the skin daily. 28 patch 3  . ondansetron (ZOFRAN ODT) 4 MG disintegrating tablet Take 1 tablet (4 mg total) by mouth every 8 (eight) hours as needed for nausea or vomiting. 12 tablet 0  . ondansetron (ZOFRAN) 8 MG tablet Take 1 tablet (8 mg total) by mouth every 8  (eight) hours as needed for nausea or vomiting. 30 tablet 3  . pantoprazole (PROTONIX) 40 MG tablet Take 1 tablet (40 mg total) by mouth 2 (two) times daily. 60 tablet 0  . prochlorperazine (COMPAZINE) 10 MG tablet Take 1 tablet (10 mg total) by mouth every 6 (six) hours as needed for nausea or vomiting. 30 tablet 0  . ranolazine (RANEXA) 500 MG 12 hr tablet Take 1 tablet (500 mg total) by mouth 2 (two) times daily. 60 tablet 11  . senna-docusate (SENNA S) 8.6-50 MG tablet Take 1 tablet by mouth daily. (Patient not taking: Reported on 04/29/2017) 30 tablet 3  . thiamine (VITAMIN B-1) 100 MG tablet Take 1 tablet (100 mg total) by mouth daily. 100 tablet 0   No current facility-administered medications for this visit.    Facility-Administered Medications Ordered in Other Visits  Medication Dose Route Frequency Provider Last Rate Last Dose  . sodium chloride flush (NS) 0.9 % injection 10 mL  10 mL Intracatheter PRN Wyatt Portela, MD   10 mL at 06/18/17 1815     Allergies:  Allergies  Allergen Reactions  . Latex     UNSPECIFIED REACTION     Past Medical History, Surgical history, Social history, and Family History reviewed today and remain unchanged.  Physical Exam:  Blood pressure 133/79, pulse 74, temperature 98.2 F (36.8 C), temperature source Oral, resp. rate 17, height 5\' 10"  (1.778 m), weight 127 lb 12.8 oz (58 kg), SpO2 100 %.   ECOG: 1 General appearance: Chronically ill-appearing gentleman without distress. Head: Atraumatic without abnormalities. Oropharynx: Mucous membranes are moist and pink. Eyes: Sclera anicteric. Lymph nodes: No cervical, supraclavicular, inguinal or axillary adenopathy Heart: Regular rate and rhythm, S1 and S2.  No leg edema or murmurs. Lung: Clear in all lung fields without any wheezing.  No dullness to percussion. Abdomin: Soft, any rebound or guarding.  No shifting dullness or ascites.  Good bowel sounds. Musculoskeletal: No clubbing or  cyanosis. Neurological: No deficits noted in the motor, sensory or deep tendon reflexes. Psychiatric examination: Appropriate mood and affect.   Lab Results: Lab Results  Component Value Date   WBC 6.2 07/30/2017   HGB 10.0 (L) 07/30/2017   HCT 30.7 (L) 07/30/2017   MCV 87.6 07/30/2017   PLT 441 (H) 07/30/2017     Chemistry      Component Value Date/Time   NA 138 07/30/2017 0853   NA 137 10/30/2016 0909   K 3.4 (L) 07/30/2017 0853   K 3.3 (L) 10/30/2016 0909   CL 106 07/30/2017 0853   CO2 22 07/30/2017 0853   CO2 27 10/30/2016 0909   BUN 3 (L) 07/30/2017 0853   BUN 4.8 (L) 10/30/2016 0909   CREATININE 0.77 07/30/2017 0853   CREATININE 0.9 10/30/2016 0909      Component Value Date/Time   CALCIUM 8.8 07/30/2017 0853   CALCIUM 9.3 10/30/2016 0909   ALKPHOS 102 07/30/2017 0853  ALKPHOS 93 10/30/2016 0909   AST 12 07/30/2017 0853   AST 35 (H) 10/30/2016 0909   ALT 6 07/30/2017 0853   ALT 25 10/30/2016 0909   BILITOT 0.2 07/30/2017 0853   BILITOT 0.41 10/30/2016 0909          Impression and Plan:  47 year old gentleman with the following issues:  1.  Stage IV poorly differentiated carcinoma arising from the lung.    He is currently receiving systemic therapy utilizing carboplatin and paclitaxel with Pembrolizumab and have received 3 cycles of therapy.  He tolerated dose reductions without any major toxicities and better tolerance.  Excellent benefits of continuing chemotherapy to complete 6 cycles was reviewed today including long-term complication associated with immunotherapy.  Given the debilitation from his chemotherapy for the first 24 hours of therapy he will not be able to attend his court date on 08/21/2017.  After discussion today, is agreeable to proceed with cycle 4 of therapy and to complete total of 6 cycles.  2.  CNS and spinal metastasis: He completed radiation therapy and currently on active surveillance.  No evidence of relapse.  3.  IV access:  Port-A-Cath remains in use without any complications.   4.  Antiemetics: Mild nausea reported and manageable at this time.  5.  Pain: Related to his malignancy and currently taking oxycodone 15 mg every 4 hours around-the-clock.  6.  Anorexia: Prescription for Megace was given to the patient today.  7.  Prognosis and goals of care: He has an incurable malignancy but disease that can be palliated at this time.  8. Follow-up: Will be in 3 weeks for cycle 5 of therapy and in 6 weeks for cycle 6.  25 minutes was spent with the patient face-to-face today.  More than 50% of time was dedicated to patient counseling, education and urinating his future plan of care.      Zola Button, MD 6/11/20199:24 AM

## 2017-08-21 ENCOUNTER — Telehealth: Payer: Self-pay | Admitting: Oncology

## 2017-08-21 ENCOUNTER — Inpatient Hospital Stay: Payer: Medicaid Other

## 2017-08-21 VITALS — BP 137/83 | HR 82 | Temp 97.0°F | Resp 18

## 2017-08-21 DIAGNOSIS — Z5111 Encounter for antineoplastic chemotherapy: Secondary | ICD-10-CM | POA: Diagnosis not present

## 2017-08-21 DIAGNOSIS — C3490 Malignant neoplasm of unspecified part of unspecified bronchus or lung: Secondary | ICD-10-CM

## 2017-08-21 MED ORDER — PEGFILGRASTIM-CBQV 6 MG/0.6ML ~~LOC~~ SOSY
6.0000 mg | PREFILLED_SYRINGE | Freq: Once | SUBCUTANEOUS | Status: AC
Start: 2017-08-21 — End: 2017-08-21
  Administered 2017-08-21: 6 mg via SUBCUTANEOUS

## 2017-08-21 MED ORDER — PEGFILGRASTIM-CBQV 6 MG/0.6ML ~~LOC~~ SOSY
PREFILLED_SYRINGE | SUBCUTANEOUS | Status: AC
  Filled 2017-08-21: qty 0.6

## 2017-08-21 NOTE — Patient Instructions (Signed)
Pegfilgrastim injection What is this medicine? PEGFILGRASTIM (PEG fil gra stim) is a long-acting granulocyte colony-stimulating factor that stimulates the growth of neutrophils, a type of white blood cell important in the body's fight against infection. It is used to reduce the incidence of fever and infection in patients with certain types of cancer who are receiving chemotherapy that affects the bone marrow, and to increase survival after being exposed to high doses of radiation. This medicine may be used for other purposes; ask your health care provider or pharmacist if you have questions. COMMON BRAND NAME(S): Neulasta What should I tell my health care provider before I take this medicine? They need to know if you have any of these conditions: -kidney disease -latex allergy -ongoing radiation therapy -sickle cell disease -skin reactions to acrylic adhesives (On-Body Injector only) -an unusual or allergic reaction to pegfilgrastim, filgrastim, other medicines, foods, dyes, or preservatives -pregnant or trying to get pregnant -breast-feeding How should I use this medicine? This medicine is for injection under the skin. If you get this medicine at home, you will be taught how to prepare and give the pre-filled syringe or how to use the On-body Injector. Refer to the patient Instructions for Use for detailed instructions. Use exactly as directed. Tell your healthcare provider immediately if you suspect that the On-body Injector may not have performed as intended or if you suspect the use of the On-body Injector resulted in a missed or partial dose. It is important that you put your used needles and syringes in a special sharps container. Do not put them in a trash can. If you do not have a sharps container, call your pharmacist or healthcare provider to get one. Talk to your pediatrician regarding the use of this medicine in children. While this drug may be prescribed for selected conditions,  precautions do apply. Overdosage: If you think you have taken too much of this medicine contact a poison control center or emergency room at once. NOTE: This medicine is only for you. Do not share this medicine with others. What if I miss a dose? It is important not to miss your dose. Call your doctor or health care professional if you miss your dose. If you miss a dose due to an On-body Injector failure or leakage, a new dose should be administered as soon as possible using a single prefilled syringe for manual use. What may interact with this medicine? Interactions have not been studied. Give your health care provider a list of all the medicines, herbs, non-prescription drugs, or dietary supplements you use. Also tell them if you smoke, drink alcohol, or use illegal drugs. Some items may interact with your medicine. This list may not describe all possible interactions. Give your health care provider a list of all the medicines, herbs, non-prescription drugs, or dietary supplements you use. Also tell them if you smoke, drink alcohol, or use illegal drugs. Some items may interact with your medicine. What should I watch for while using this medicine? You may need blood work done while you are taking this medicine. If you are going to need a MRI, CT scan, or other procedure, tell your doctor that you are using this medicine (On-Body Injector only). What side effects may I notice from receiving this medicine? Side effects that you should report to your doctor or health care professional as soon as possible: -allergic reactions like skin rash, itching or hives, swelling of the face, lips, or tongue -dizziness -fever -pain, redness, or irritation at site   where injected -pinpoint red spots on the skin -red or dark-brown urine -shortness of breath or breathing problems -stomach or side pain, or pain at the shoulder -swelling -tiredness -trouble passing urine or change in the amount of urine Side  effects that usually do not require medical attention (report to your doctor or health care professional if they continue or are bothersome): -bone pain -muscle pain This list may not describe all possible side effects. Call your doctor for medical advice about side effects. You may report side effects to FDA at 1-800-FDA-1088. Where should I keep my medicine? Keep out of the reach of children. Store pre-filled syringes in a refrigerator between 2 and 8 degrees C (36 and 46 degrees F). Do not freeze. Keep in carton to protect from light. Throw away this medicine if it is left out of the refrigerator for more than 48 hours. Throw away any unused medicine after the expiration date. NOTE: This sheet is a summary. It may not cover all possible information. If you have questions about this medicine, talk to your doctor, pharmacist, or health care provider.  2018 [employer]vier/Gold Standard (2016-02-23 12:58:03)  

## 2017-08-21 NOTE — Telephone Encounter (Signed)
Scheduled appt per 6/11 los - added next treatment and left a message for patient .

## 2017-08-23 ENCOUNTER — Other Ambulatory Visit: Payer: Self-pay | Admitting: *Deleted

## 2017-08-27 ENCOUNTER — Other Ambulatory Visit: Payer: Self-pay | Admitting: Internal Medicine

## 2017-08-27 ENCOUNTER — Telehealth: Payer: Self-pay | Admitting: Radiation Oncology

## 2017-08-27 MED ORDER — LEVETIRACETAM 750 MG PO TABS: 1500.0000 mg | ORAL_TABLET | Freq: Two times a day (BID) | ORAL | 3 refills | Status: DC

## 2017-08-27 NOTE — Telephone Encounter (Signed)
I Called the patient after he called yesterday saying he had questions about some concerns he was having.  He states that he has been having some feelings of throbbing sensation in the back of his skull along his previous surgical site, and that this is been coming and going off and on for several days.  He states that he ultimately yesterday evening after 30 minutes later from taking his Keppra developed a grand mal seizure, this started in his right arm.  He did lose consciousness and experienced a seizure for approximately 7 minutes.  He was postictal for 2 to 3 minutes per his wife, and does not recall this occurring.  He states that he has been able to sleep despite the throbbing sensation in his head, and also is seeing Dr. 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  I will contact Dr. Mickeal Skinner as the patient reports he has been taking his Keppra as ordered, and has not missed any doses.  He states he has been taking it twice daily he is not scheduled for his next scan until around the same time each day. His next scan is due 09/26/2017.  I let him know that with this may need to be bumped up sooner.  He states agreement and we will be in touch with him today.    Carola Rhine, PAC

## 2017-08-30 ENCOUNTER — Ambulatory Visit
Admission: RE | Admit: 2017-08-30 | Discharge: 2017-08-30 | Disposition: A | Discharge status: Home / Self Care | Payer: Medicaid Other | Source: Ambulatory Visit | Attending: Radiation Oncology | Admitting: Radiation Oncology

## 2017-08-30 DIAGNOSIS — C7949 Secondary malignant neoplasm of other parts of nervous system: Principal | ICD-10-CM

## 2017-08-30 DIAGNOSIS — C7931 Secondary malignant neoplasm of brain: Secondary | ICD-10-CM

## 2017-08-30 MED ORDER — GADOBENATE DIMEGLUMINE 529 MG/ML IV SOLN
12.0000 mL | Freq: Once | INTRAVENOUS | Status: AC | PRN
  Administered 2017-08-30: 12 mL via INTRAVENOUS

## 2017-09-03 ENCOUNTER — Telehealth: Payer: Self-pay | Admitting: Radiation Therapy

## 2017-09-03 NOTE — Telephone Encounter (Signed)
[name] called today with a few issues regarding Eric Mason.   1) During his last visit with Dr. Alen Blew, his pain medication was refilled, but [name] is concerned that he did not receive enough pills. He takes 2 pills every 4 hours instead of 1 since the Rx reads 1-2 tablets every 4 hours. She is worried that Eric Mason will run out before it is time for a refill and that this will cause a problem.  2) Eric Mason has been experiencing erectile dysfunction for about 6 months now. He is interested in something to help with this. [name] suggested Viagra)   3) Eric Mason has a painful growth on the back of his Rt ear that is red and very sore.  -------------------------------- For the medication refill and request for Viagra, I have left a voicemail on Dr. Hazeline Junker nurse line.   For the growth behind the ear, we will discuss his recent MRI during our brain and spine conference Wed 6/26. Dr. Mickeal Skinner is scheduled to see Eric Mason on Thursday 6/27, he will examine this at that time.   [name] is happy with this plan.   Mont Dutton R.T.(R)(T) Special Procedures Navigator  [phone]

## 2017-09-04 ENCOUNTER — Ambulatory Visit: Payer: Medicaid Other | Admitting: Radiation Oncology

## 2017-09-05 ENCOUNTER — Telehealth: Payer: Self-pay | Admitting: *Deleted

## 2017-09-05 ENCOUNTER — Inpatient Hospital Stay: Payer: Medicaid Other | Admitting: Internal Medicine

## 2017-09-05 NOTE — Telephone Encounter (Signed)
Ok to [employer] oxycodone #180. I will address all other issues next visit.

## 2017-09-05 NOTE — Telephone Encounter (Signed)
Received voice mail message from [place], wife, stating,"Eric Mason would like Dr. Alen Blew to prescribe Jerilynn Som for his manhood. His pain medication should have been written for #180 Oxycodone 15 mg tablets instead of #126. He takes two tablets every four hours. The directions on the bottle say take one tablet every four hours as needed. Return number is [phone].

## 2017-09-09 ENCOUNTER — Inpatient Hospital Stay: Payer: Medicaid Other

## 2017-09-09 ENCOUNTER — Inpatient Hospital Stay (HOSPITAL_BASED_OUTPATIENT_CLINIC_OR_DEPARTMENT_OTHER): Payer: Medicaid Other | Admitting: Internal Medicine

## 2017-09-09 ENCOUNTER — Inpatient Hospital Stay: Payer: Medicaid Other | Attending: Internal Medicine | Admitting: Nurse Practitioner

## 2017-09-09 ENCOUNTER — Encounter: Payer: Self-pay | Admitting: Nurse Practitioner

## 2017-09-09 VITALS — BP 134/102 | HR 89 | Temp 98.7°F | Resp 17 | Ht 70.0 in | Wt 126.9 lb

## 2017-09-09 VITALS — BP 138/98 | HR 79 | Temp 98.4°F | Resp 19 | Wt 125.9 lb

## 2017-09-09 DIAGNOSIS — R569 Unspecified convulsions: Secondary | ICD-10-CM

## 2017-09-09 DIAGNOSIS — G893 Neoplasm related pain (acute) (chronic): Secondary | ICD-10-CM

## 2017-09-09 DIAGNOSIS — Z79899 Other long term (current) drug therapy: Secondary | ICD-10-CM

## 2017-09-09 DIAGNOSIS — C3490 Malignant neoplasm of unspecified part of unspecified bronchus or lung: Secondary | ICD-10-CM

## 2017-09-09 DIAGNOSIS — C7931 Secondary malignant neoplasm of brain: Secondary | ICD-10-CM

## 2017-09-09 DIAGNOSIS — Z5111 Encounter for antineoplastic chemotherapy: Secondary | ICD-10-CM | POA: Diagnosis not present

## 2017-09-09 DIAGNOSIS — C7951 Secondary malignant neoplasm of bone: Secondary | ICD-10-CM

## 2017-09-09 DIAGNOSIS — Z95828 Presence of other vascular implants and grafts: Secondary | ICD-10-CM

## 2017-09-09 DIAGNOSIS — Z923 Personal history of irradiation: Secondary | ICD-10-CM

## 2017-09-09 LAB — CMP (CANCER CENTER ONLY)
ALBUMIN: 4.1 g/dL (ref 3.5–5.0)
ALK PHOS: 90 U/L (ref 38–126)
ALT: 9 U/L (ref 0–44)
AST: 12 U/L — AB (ref 15–41)
Anion gap: 9 (ref 5–15)
BILIRUBIN TOTAL: 0.4 mg/dL (ref 0.3–1.2)
BUN: 9 mg/dL (ref 6–20)
CALCIUM: 9.5 mg/dL (ref 8.9–10.3)
CO2: 24 mmol/L (ref 22–32)
CREATININE: 0.77 mg/dL (ref 0.61–1.24)
Chloride: 102 mmol/L (ref 98–111)
GFR, Est AFR Am: >60 mL/min (ref >60)
GFR, Estimated: >60 mL/min (ref >60)
GLUCOSE: 100 mg/dL — AB (ref 70–99)
Potassium: 3.7 mmol/L (ref 3.5–5.1)
Sodium: 135 mmol/L (ref 135–145)
TOTAL PROTEIN: 8 g/dL (ref 6.5–8.1)

## 2017-09-09 LAB — CBC WITH DIFFERENTIAL (CANCER CENTER ONLY)
BASOS PCT: 0 %
Basophils Absolute: 0 10*3/uL (ref 0.0–0.1)
EOS PCT: 0 %
Eosinophils Absolute: 0 10*3/uL (ref 0.0–0.5)
HCT: 35.2 % — ABNORMAL LOW (ref 38.4–49.9)
Hemoglobin: 11.9 g/dL — ABNORMAL LOW (ref 13.0–17.1)
Lymphocytes Relative: 49 %
Lymphs Abs: 3.3 10*3/uL (ref 0.9–3.3)
MCH: 29 pg (ref 27.2–33.4)
MCHC: 33.8 g/dL (ref 32.0–36.0)
MCV: 85.6 fL (ref 79.3–98.0)
MONO ABS: 0.5 10*3/uL (ref 0.1–0.9)
Monocytes Relative: 8 %
Neutro Abs: 2.9 10*3/uL (ref 1.5–6.5)
Neutrophils Relative %: 43 %
PLATELETS: 272 10*3/uL (ref 140–400)
RBC: 4.11 MIL/uL — ABNORMAL LOW (ref 4.20–5.82)
RDW: 19.6 % — AB (ref 11.0–14.6)
WBC Count: 6.7 10*3/uL (ref 4.0–10.3)

## 2017-09-09 MED ORDER — SODIUM CHLORIDE 0.9% FLUSH
10.0000 mL | Freq: Once | INTRAVENOUS | Status: AC
  Administered 2017-09-09: 10 mL
  Filled 2017-09-09: qty 10

## 2017-09-09 MED ORDER — HEPARIN SOD (PORK) LOCK FLUSH 100 UNIT/ML IV SOLN
250.0000 [IU] | Freq: Once | INTRAVENOUS | Status: AC
  Administered 2017-09-09: 500 [IU]
  Filled 2017-09-09: qty 5

## 2017-09-09 MED ORDER — MEGESTROL ACETATE 400 MG/10ML PO SUSP: 400.0000 mg | Freq: Two times a day (BID) | ORAL | 0 refills | Status: DC
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
## 2017-09-09 NOTE — Progress Notes (Signed)
  [facility] OFFICE PROGRESS NOTE   Diagnosis: Poorly differentiated neoplasm of the lung with metastatic disease to the brain and spine diagnosed in May 2018  Prior Therapy:   Status post craniotomy and resection in May 2018. This was followed by [facility] completed in June 2018.  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   Current therapy: Carboplatin and paclitaxel with Pembrolizumab cycle 1 given on 06/18/2017.    Cycle 4 completed 08/20/2017.   INTERVAL HISTORY:   Eric Mason returns as scheduled.  He completed cycle 4 carboplatin/Taxol/Pembrolizumab 08/20/2017.  He denies nausea/vomiting.  No mouth sores.  No diarrhea.  No rash.  He denies shortness of breath.  No cough.  No fever.  He denies numbness or tingling in the hands or feet.  He noted improvement in appetite since beginning Megace and would like a refill on this.  He reports his pain is well controlled.  He notes an increase in pain for about 3 days following the white cell growth factor.  Objective:  Vital signs in last 24 hours:  Blood pressure (!) 138/98, pulse 79, temperature 98.4 F (36.9 C), temperature source Oral, resp. rate 19, weight 125 lb 14.4 oz (57.1 kg), SpO2 97 %.    HEENT: No thrush or ulcers. Resp: Lungs clear bilaterally. Cardio: Regular rate and rhythm. GI: Abdomen soft and nontender.  No hepatomegaly. Vascular: No leg edema. Neuro: Alert and oriented.  Gait normal. Skin: No rash. Port-A-Cath without erythema.  Lab Results:  Lab Results  Component Value Date   WBC 6.7 09/09/2017   HGB 11.9 (L) 09/09/2017   HCT 35.2 (L) 09/09/2017   MCV 85.6 09/09/2017   PLT 272 09/09/2017   NEUTROABS 2.9 09/09/2017    Imaging:  No results found.  Medications: I have reviewed the patient's current medications.  Assessment/Plan: 1. 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  He has completed 4 cycles.    Disposition: Eric Mason appears stable.  He has completed 4 cycles of carboplatin/Taxol/Pembrolizumab.  Plan to proceed with cycle 5 as scheduled 09/10/2017.  He will return for lab, follow-up and the next cycle of treatment in 3 weeks.  He will contact the office in the interim with any problems.    Ned Card ANP/GNP-BC   09/09/2017  3:14 PM

## 2017-09-10 ENCOUNTER — Other Ambulatory Visit: Payer: Self-pay | Admitting: Nurse Practitioner

## 2017-09-10 ENCOUNTER — Inpatient Hospital Stay: Payer: Medicaid Other

## 2017-09-10 VITALS — BP 125/93 | HR 95 | Temp 98.1°F | Resp 18

## 2017-09-10 DIAGNOSIS — Z5111 Encounter for antineoplastic chemotherapy: Secondary | ICD-10-CM | POA: Diagnosis not present

## 2017-09-10 DIAGNOSIS — C3490 Malignant neoplasm of unspecified part of unspecified bronchus or lung: Secondary | ICD-10-CM

## 2017-09-10 MED ORDER — OXYCODONE-ACETAMINOPHEN 5-325 MG PO TABS
2.0000 | ORAL_TABLET | Freq: Once | ORAL | Status: AC
  Administered 2017-09-10: 2 via ORAL

## 2017-09-10 MED ORDER — PEMBROLIZUMAB CHEMO INJECTION 100 MG/4ML
200.0000 mg | Freq: Once | INTRAVENOUS | Status: AC
  Administered 2017-09-10: 200 mg via INTRAVENOUS
  Filled 2017-09-10: qty 8

## 2017-09-10 MED ORDER — HEPARIN SOD (PORK) LOCK FLUSH 100 UNIT/ML IV SOLN
500.0000 [IU] | Freq: Once | INTRAVENOUS | Status: AC | PRN
  Administered 2017-09-10: 500 [IU]
  Filled 2017-09-10: qty 5

## 2017-09-10 MED ORDER — SODIUM CHLORIDE 0.9 % IV SOLN
Freq: Once | INTRAVENOUS | Status: AC
  Administered 2017-09-10: 09:00:00 via INTRAVENOUS

## 2017-09-10 MED ORDER — SODIUM CHLORIDE 0.9% FLUSH
10.0000 mL | INTRAVENOUS | Status: DC | PRN
  Administered 2017-09-10: 10 mL
  Filled 2017-09-10: qty 10

## 2017-09-10 MED ORDER — OXYCODONE-ACETAMINOPHEN 5-325 MG PO TABS
ORAL_TABLET | ORAL | Status: AC
Start: 2017-09-10 — End: ?
  Filled 2017-09-10: qty 2

## 2017-09-10 NOTE — Progress Notes (Signed)
Per Maggie in Pharmacy; spoke with Ned Card, NP; pt will only receive Keytruda today (09/10/17), and does not need injection appointment for [facility] tomorrow (09/11/17).

## 2017-09-11 ENCOUNTER — Inpatient Hospital Stay: Payer: Medicaid Other

## 2017-09-16 ENCOUNTER — Other Ambulatory Visit: Payer: Self-pay | Admitting: Oncology

## 2017-09-17 ENCOUNTER — Telehealth: Payer: Self-pay | Admitting: Radiation Therapy
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
## 2017-09-24 ENCOUNTER — Other Ambulatory Visit: Payer: Self-pay | Admitting: *Deleted

## 2017-09-24 MED ORDER — OXYCODONE HCL 15 MG PO TABS: 15.0000 mg | ORAL_TABLET | ORAL | 0 refills | Status: DC | PRN

## 2017-09-24 NOTE — Telephone Encounter (Signed)
Wife heather calling for a refill on pain medication

## 2017-09-26 ENCOUNTER — Other Ambulatory Visit: Payer: Medicaid Other

## 2017-09-26 ENCOUNTER — Other Ambulatory Visit: Payer: Self-pay | Admitting: *Deleted

## 2017-09-26 DIAGNOSIS — C7931 Secondary malignant neoplasm of brain: Secondary | ICD-10-CM

## 2017-09-30 ENCOUNTER — Ambulatory Visit: Payer: Self-pay | Admitting: Radiation Oncology

## 2017-10-01 ENCOUNTER — Other Ambulatory Visit: Payer: Self-pay | Admitting: *Deleted

## 2017-10-02 ENCOUNTER — Other Ambulatory Visit: Payer: Medicaid Other

## 2017-10-02 ENCOUNTER — Inpatient Hospital Stay: Payer: Medicaid Other

## 2017-10-02 ENCOUNTER — Inpatient Hospital Stay (HOSPITAL_BASED_OUTPATIENT_CLINIC_OR_DEPARTMENT_OTHER): Payer: Medicaid Other | Admitting: Oncology

## 2017-10-02 ENCOUNTER — Telehealth: Payer: Self-pay | Admitting: Oncology

## 2017-10-02 DIAGNOSIS — G893 Neoplasm related pain (acute) (chronic): Secondary | ICD-10-CM

## 2017-10-02 DIAGNOSIS — Z95828 Presence of other vascular implants and grafts: Secondary | ICD-10-CM

## 2017-10-02 DIAGNOSIS — C7931 Secondary malignant neoplasm of brain: Secondary | ICD-10-CM

## 2017-10-02 DIAGNOSIS — C3490 Malignant neoplasm of unspecified part of unspecified bronchus or lung: Secondary | ICD-10-CM

## 2017-10-02 DIAGNOSIS — Z5111 Encounter for antineoplastic chemotherapy: Secondary | ICD-10-CM | POA: Diagnosis not present

## 2017-10-02 DIAGNOSIS — C7951 Secondary malignant neoplasm of bone: Secondary | ICD-10-CM

## 2017-10-02 DIAGNOSIS — Z79899 Other long term (current) drug therapy: Secondary | ICD-10-CM

## 2017-10-02 DIAGNOSIS — Z923 Personal history of irradiation: Secondary | ICD-10-CM

## 2017-10-02 LAB — CMP (CANCER CENTER ONLY)
ALT: 12 U/L (ref 0–44)
AST: 16 U/L (ref 15–41)
Albumin: 3.9 g/dL (ref 3.5–5.0)
Alkaline Phosphatase: 66 U/L (ref 38–126)
Anion gap: 9 (ref 5–15)
BUN: 8 mg/dL (ref 6–20)
CHLORIDE: 103 mmol/L (ref 98–111)
CO2: 25 mmol/L (ref 22–32)
Calcium: 9.3 mg/dL (ref 8.9–10.3)
Creatinine: 0.83 mg/dL (ref 0.61–1.24)
Glucose, Bld: 103 mg/dL — ABNORMAL HIGH (ref 70–99)
Potassium: 3.6 mmol/L (ref 3.5–5.1)
Sodium: 137 mmol/L (ref 135–145)
TOTAL PROTEIN: 7.6 g/dL (ref 6.5–8.1)

## 2017-10-02 LAB — CBC WITH DIFFERENTIAL (CANCER CENTER ONLY)
BASOS ABS: 0 10*3/uL (ref 0.0–0.1)
BASOS PCT: 1 %
Eosinophils Absolute: 0.1 10*3/uL (ref 0.0–0.5)
Eosinophils Relative: 1 %
HCT: 35.3 % — ABNORMAL LOW (ref 38.4–49.9)
HEMOGLOBIN: 11.7 g/dL — AB (ref 13.0–17.1)
LYMPHS ABS: 3.7 10*3/uL — AB (ref 0.9–3.3)
Lymphocytes Relative: 56 %
MCH: 29.1 pg (ref 27.2–33.4)
MCHC: 33.1 g/dL (ref 32.0–36.0)
MCV: 87.8 fL (ref 79.3–98.0)
Monocytes Absolute: 0.5 10*3/uL (ref 0.1–0.9)
Monocytes Relative: 7 %
NEUTROS PCT: 35 %
Neutro Abs: 2.3 10*3/uL (ref 1.5–6.5)
Platelet Count: 298 10*3/uL (ref 140–400)
RBC: 4.02 MIL/uL — AB (ref 4.20–5.82)
RDW: 17.5 % — ABNORMAL HIGH (ref 11.0–14.6)
WBC: 6.6 10*3/uL (ref 4.0–10.3)

## 2017-10-02 MED ORDER — SODIUM CHLORIDE 0.9% FLUSH
10.0000 mL | INTRAVENOUS | Status: DC | PRN
Start: 2017-10-02 — End: 2017-10-02
  Administered 2017-10-02: 10 mL
  Filled 2017-10-02: qty 10

## 2017-10-02 MED ORDER — HEPARIN SOD (PORK) LOCK FLUSH 100 UNIT/ML IV SOLN
500.0000 [IU] | Freq: Once | INTRAVENOUS | Status: AC | PRN
  Administered 2017-10-02: 500 [IU]
  Filled 2017-10-02: qty 5

## 2017-10-02 MED ORDER — SODIUM CHLORIDE 0.9% FLUSH
10.0000 mL | Freq: Once | INTRAVENOUS | Status: AC
  Administered 2017-10-02: 10 mL
  Filled 2017-10-02: qty 10

## 2017-10-02 MED ORDER — SODIUM CHLORIDE 0.9 % IV SOLN
Freq: Once | INTRAVENOUS | Status: AC
  Administered 2017-10-02: 10:00:00 via INTRAVENOUS
  Filled 2017-10-02: qty 250

## 2017-10-02 MED ORDER — SODIUM CHLORIDE 0.9 % IV SOLN
200.0000 mg | Freq: Once | INTRAVENOUS | Status: AC
  Administered 2017-10-02: 200 mg via INTRAVENOUS
  Filled 2017-10-02: qty 8

## 2017-10-02 NOTE — Patient Instructions (Signed)
[facility] Discharge Instructions for Patients Receiving Chemotherapy  Today you received the following chemotherapy agents :  Keytruda.  To help prevent nausea and vomiting after your treatment, we encourage you to take your nausea medication as prescribed.   If you develop nausea and vomiting that is not controlled by your nausea medication, call the clinic.   BELOW ARE SYMPTOMS THAT SHOULD BE REPORTED IMMEDIATELY:  *FEVER GREATER THAN 100.5 F  *CHILLS WITH OR WITHOUT FEVER  NAUSEA AND VOMITING THAT IS NOT CONTROLLED WITH YOUR NAUSEA MEDICATION  *UNUSUAL SHORTNESS OF BREATH  *UNUSUAL BRUISING OR BLEEDING  TENDERNESS IN MOUTH AND THROAT WITH OR WITHOUT PRESENCE OF ULCERS  *URINARY PROBLEMS  *BOWEL PROBLEMS  UNUSUAL RASH Items with * indicate a potential emergency and should be followed up as soon as possible.  Feel free to call the clinic should you have any questions or concerns. The clinic phone number is [phone].  Please show the CHEMO ALERT CARD at check-in to the Emergency Department and triage nurse.  

## 2017-10-02 NOTE — Telephone Encounter (Signed)
Gave patient avs and calendar of upcoming appts.  °

## 2017-10-02 NOTE — Progress Notes (Signed)
Hematology and Oncology Follow Up Visit  Eric Mason [id number] October 31, 1970 47 y.o. 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    Prior Therapy:   Status post craniotomy and resection in May 2018. This was followed by [facility] completed in June 2018.  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   Current therapy: Carboplatin and paclitaxel with Pembrolizumab cycle 1 given on 06/18/2017.  He completed 4 cycles of carboplatin and paclitaxel and currently receiving Pembrolizumab only.  He received a cycle 5 of Pembrolizumab on September 10, 2017.  He is here for cycle 6.  Interim History: Eric Mason is here for a follow-up visit.  Since the last visit, he reports no major changes or complaints.  He tolerated Pembrolizumab single agent without any complications.  He denies any nausea, vomiting or worsening fatigue.  Continues to have back pain issues and uses oxycodone around-the-clock with relief of his complaints.  His appetite is excellent and have gained weight.  He denies any worsening neuropathy.  His performance status and quality of life remain unchanged.  He denies any headaches blurred vision, syncope or seizures.  He denies any dizziness or confusion.  He does not report any any fevers or chills or sweats.  His weight is up.  He denies any cough, wheezing or hemoptysis.  He denies any shortness of breath or dyspnea on exertion.  He denies any chest pain, palpitation, orthopnea or leg edema. He is not reporting nausea, vomiting or abdominal pain.  He denies any constipation or diarrhea.  He does not report any frequency urgency or hesitancy.  He denies any worsening back pain or myalgias.  He does not  report any skin rashes or lesions.  He does not report any lymphadenopathy or petechiae.  He denies any mood changes.  Denies any bleeding or clotting tendencies.  Remaining review of systems is negative.    Medications: I have reviewed the patient's current medications.  Current Outpatient Medications  Medication Sig Dispense Refill  . albuterol (PROVENTIL HFA;VENTOLIN HFA) 108 (90 Base) MCG/ACT inhaler Inhale 2 puffs into the lungs every 6 (six) hours as needed for wheezing or shortness of breath. 1 Inhaler 6  . amitriptyline (ELAVIL) 25 MG tablet Take 25 mg by mouth at bedtime.    . B Complex-C (B-COMPLEX WITH VITAMIN C) tablet Take 1 tablet by mouth daily.    . Calcium Carb-Cholecalciferol (CALCIUM 500 +D) 500-400 MG-UNIT TABS 1 tab PO BID 60 tablet 2  . dexamethasone (DECADRON) 4 MG tablet Take 1 tablet (4 mg total) by mouth 2 (two) times daily. 40 tablet 1  . DULoxetine (CYMBALTA) 30 MG capsule Take 1 capsule (30 mg total) by mouth daily. 30 capsule 5  . folic acid (FOLVITE) 1 MG tablet Take 1 tablet (1 mg total) by mouth daily. 100 tablet 1  . gabapentin (NEURONTIN) 600 MG tablet Take 1 tablet (600 mg total) by mouth 3 (three) times daily. 90 tablet 2  . levETIRAcetam (KEPPRA) 750 MG tablet Take 2 tablets (1,500 mg total) by mouth 2 (two) times daily. 120 tablet 3  . lidocaine-prilocaine (EMLA) cream Apply 1 application topically as needed. 30 g 0  . megestrol (MEGACE) 400 MG/10ML suspension Take 10 mLs (400 mg total) by mouth  2 (two) times daily. 240 mL 0  . Multiple Vitamin (MULTIVITAMIN) tablet Take 1 tablet by mouth daily.    . nicotine (NICODERM CQ - DOSED IN MG/24 HOURS) 21 mg/24hr patch Place 1 patch (21 mg total) onto the skin daily. 28 patch 3  . ondansetron (ZOFRAN ODT) 4 MG disintegrating tablet Take 1 tablet (4 mg total) by mouth every 8 (eight) hours as needed for nausea or vomiting. 12 tablet 0  . ondansetron (ZOFRAN) 8 MG tablet Take 1 tablet (8 mg total) by mouth every 8  (eight) hours as needed for nausea or vomiting. 30 tablet 3  . oxyCODONE (ROXICODONE) 15 MG immediate release tablet Take 1 tablet (15 mg total) by mouth every 4 (four) hours as needed for pain. 126 tablet 0  . pantoprazole (PROTONIX) 40 MG tablet Take 1 tablet (40 mg total) by mouth 2 (two) times daily. 60 tablet 0  . prochlorperazine (COMPAZINE) 10 MG tablet Take 1 tablet (10 mg total) by mouth every 6 (six) hours as needed for nausea or vomiting. 30 tablet 0  . ranolazine (RANEXA) 500 MG 12 hr tablet Take 1 tablet (500 mg total) by mouth 2 (two) times daily. 60 tablet 11  . senna-docusate (SENNA S) 8.6-50 MG tablet Take 1 tablet by mouth daily. 30 tablet 3  . thiamine (VITAMIN B-1) 100 MG tablet Take 1 tablet (100 mg total) by mouth daily. 100 tablet 0   No current facility-administered medications for this visit.    Facility-Administered Medications Ordered in Other Visits  Medication Dose Route Frequency Provider Last Rate Last Dose  . sodium chloride flush (NS) 0.9 % injection 10 mL  10 mL Intracatheter PRN Wyatt Portela, MD   10 mL at 06/18/17 1815     Allergies:  Allergies  Allergen Reactions  . Latex Itching and Rash    Past Medical History, Surgical history, Social history, and Family History reviewed today and remain unchanged.  Physical Exam:  Blood pressure 127/89, pulse 81, temperature 98.6 F (37 C), temperature source Oral, resp. rate 17, height 5\' 10"  (1.778 m), weight 132 lb 1.6 oz (59.9 kg), SpO2 100 %.   ECOG: 1 General appearance: Well-appearing gentleman without distress. Head: Normocephalic without normalities. Oropharynx: No oral thrush or ulcers. Eyes: Pupils are equal and round reactive to light. Lymph nodes: No lymphadenopathy noted in the cervical, supraclavicular, inguinal or axillary lymph nodes. Heart: Regular rate without any murmurs or gallops.  S1 and S2 without leg edema. Lung: Clear without any rhonchi, wheezes or dullness to  percussion. Abdomin: Soft, nontender without any rebound or guarding. Musculoskeletal: No joint deformity or effusion.  Neurological: No motor or sensory deficits. Psychiatric: Appropriate mood and affect.   Lab Results: Lab Results  Component Value Date   WBC 6.7 09/09/2017   HGB 11.9 (L) 09/09/2017   HCT 35.2 (L) 09/09/2017   MCV 85.6 09/09/2017   PLT 272 09/09/2017     Chemistry      Component Value Date/Time   NA 135 09/09/2017 1419   NA 137 10/30/2016 0909   K 3.7 09/09/2017 1419   K 3.3 (L) 10/30/2016 0909   CL 102 09/09/2017 1419   CO2 24 09/09/2017 1419   CO2 27 10/30/2016 0909   BUN 9 09/09/2017 1419   BUN 4.8 (L) 10/30/2016 0909   CREATININE 0.77 09/09/2017 1419   CREATININE 0.9 10/30/2016 0909      Component Value Date/Time   CALCIUM 9.5 09/09/2017 1419   CALCIUM 9.3  10/30/2016 0909   ALKPHOS 90 09/09/2017 1419   ALKPHOS 93 10/30/2016 0909   AST 12 (L) 09/09/2017 1419   AST 35 (H) 10/30/2016 0909   ALT 9 09/09/2017 1419   ALT 25 10/30/2016 0909   BILITOT 0.4 09/09/2017 1419   BILITOT 0.41 10/30/2016 0909          Impression and Plan:  47 year old gentleman with the following issues:  1.  Stage IV lung neoplasm of the pathology showed poorly differentiated carcinoma.  His metastatic disease the bone as well as the brain.  He has completed 4 cycles of carboplatin and Pembrolizumab and currently receiving Pembrolizumab single agent.  The natural course of this disease as well as treatment options were reviewed.  The plan is to complete 6 cycles of Pembrolizumab which he will receive his 6 cycle today.  After that a treatment break will be needed and will repeat imaging studies for staging purposes and 2 months.  Based on the results we will determine whether further treatments may be needed from a systemic standpoint.  2.  CNS and spinal metastasis: No evidence of recurrent disease.  A repeat MRI of the brain scheduled in the next few months.  3.  IV  access: Port-A-Cath will be flushed periodically while not in use.   4.  Nausea prophylaxis: Very limited nausea and vomiting related to treatment noted.  5.  Pain: He is currently on oxycodone 15 mg every 4 hours around-the-clock with adequate control of his cancer related pain.  6.  Anorexia: Improved at this time and have gained weight.  7.  Prognosis and goals of care: Treatment is palliative although his performance status remains adequate and aggressive therapy is warranted.  8. Follow-up: Will be in 2 months to follow his progress.  25 minutes was spent with the patient face-to-face today.  More than 50% of time was dedicated to patient counseling, education and discussing the natural course of his disease as well as treatment options.    Zola Button, MD 7/24/20199:03 AM

## 2017-10-03 ENCOUNTER — Ambulatory Visit: Payer: Medicaid Other

## 2017-10-21 ENCOUNTER — Other Ambulatory Visit: Payer: Self-pay | Admitting: *Deleted

## 2017-10-21 ENCOUNTER — Telehealth: Payer: Self-pay | Admitting: *Deleted

## 2017-10-21 DIAGNOSIS — M79605 Pain in left leg: Secondary | ICD-10-CM

## 2017-10-21 DIAGNOSIS — M79604 Pain in right leg: Secondary | ICD-10-CM

## 2017-10-21 DIAGNOSIS — C3490 Malignant neoplasm of unspecified part of unspecified bronchus or lung: Secondary | ICD-10-CM

## 2017-10-21 DIAGNOSIS — C763 Malignant neoplasm of pelvis: Secondary | ICD-10-CM

## 2017-10-21 DIAGNOSIS — C7931 Secondary malignant neoplasm of brain: Secondary | ICD-10-CM

## 2017-10-21 MED ORDER — OXYCODONE HCL 15 MG PO TABS: 15.0000 mg | ORAL_TABLET | ORAL | 0 refills | Status: DC | PRN

## 2017-10-21 NOTE — Telephone Encounter (Signed)
Spoke with patient and informed him that his prescription is ready for pickup. He verbalized understanding.

## 2017-10-21 NOTE — Telephone Encounter (Signed)
Tried returning [employer] call but her number is not accepting voice mail messages. Will try calling later.

## 2017-11-14 ENCOUNTER — Other Ambulatory Visit: Payer: Self-pay | Admitting: *Deleted

## 2017-11-14 DIAGNOSIS — C3490 Malignant neoplasm of unspecified part of unspecified bronchus or lung: Secondary | ICD-10-CM

## 2017-11-14 DIAGNOSIS — M79605 Pain in left leg: Secondary | ICD-10-CM

## 2017-11-14 DIAGNOSIS — C7931 Secondary malignant neoplasm of brain: Secondary | ICD-10-CM

## 2017-11-14 DIAGNOSIS — C763 Malignant neoplasm of pelvis: Secondary | ICD-10-CM

## 2017-11-14 DIAGNOSIS — M79604 Pain in right leg: Secondary | ICD-10-CM

## 2017-11-14 MED ORDER — OXYCODONE HCL 15 MG PO TABS: 15.0000 mg | ORAL_TABLET | ORAL | 0 refills | Status: DC | PRN

## 2017-11-29 ENCOUNTER — Telehealth: Payer: Self-pay | Admitting: Internal Medicine

## 2017-11-29 NOTE — Telephone Encounter (Signed)
Z[facility] PAL moved 10/1 f/u to 10/7. Left message for patient and confirmed other appointments and for patient to reach out to central radiology re ct scan. Schedule mailed.

## 2017-12-03 ENCOUNTER — Inpatient Hospital Stay: Payer: Medicaid Other

## 2017-12-03 ENCOUNTER — Inpatient Hospital Stay: Payer: Medicaid Other | Attending: Internal Medicine

## 2017-12-10 ENCOUNTER — Inpatient Hospital Stay: Payer: Medicaid Other | Attending: Internal Medicine | Admitting: Oncology

## 2017-12-10 ENCOUNTER — Other Ambulatory Visit: Payer: Self-pay | Admitting: Radiation Therapy

## 2017-12-10 ENCOUNTER — Ambulatory Visit: Payer: Medicaid Other | Admitting: Internal Medicine

## 2017-12-10 DIAGNOSIS — C763 Malignant neoplasm of pelvis: Secondary | ICD-10-CM

## 2017-12-10 DIAGNOSIS — C7931 Secondary malignant neoplasm of brain: Secondary | ICD-10-CM

## 2017-12-10 DIAGNOSIS — C801 Malignant (primary) neoplasm, unspecified: Secondary | ICD-10-CM

## 2017-12-10 DIAGNOSIS — F5089 Other specified eating disorder: Secondary | ICD-10-CM | POA: Diagnosis not present

## 2017-12-10 DIAGNOSIS — R569 Unspecified convulsions: Secondary | ICD-10-CM | POA: Insufficient documentation

## 2017-12-10 DIAGNOSIS — G8929 Other chronic pain: Secondary | ICD-10-CM

## 2017-12-10 DIAGNOSIS — Z923 Personal history of irradiation: Secondary | ICD-10-CM | POA: Diagnosis not present

## 2017-12-10 DIAGNOSIS — M79604 Pain in right leg: Secondary | ICD-10-CM

## 2017-12-10 DIAGNOSIS — M79605 Pain in left leg: Secondary | ICD-10-CM

## 2017-12-10 DIAGNOSIS — Z79899 Other long term (current) drug therapy: Secondary | ICD-10-CM | POA: Diagnosis not present

## 2017-12-10 DIAGNOSIS — C7951 Secondary malignant neoplasm of bone: Secondary | ICD-10-CM | POA: Diagnosis not present

## 2017-12-10 DIAGNOSIS — Z9221 Personal history of antineoplastic chemotherapy: Secondary | ICD-10-CM | POA: Diagnosis not present

## 2017-12-10 DIAGNOSIS — C3490 Malignant neoplasm of unspecified part of unspecified bronchus or lung: Secondary | ICD-10-CM

## 2017-12-10 MED ORDER — OXYCODONE HCL 15 MG PO TABS: 15.0000 mg | ORAL_TABLET | ORAL | 0 refills | Status: DC | PRN

## 2017-12-10 MED ORDER — MEGESTROL ACETATE 400 MG/10ML PO SUSP: 400.0000 mg | Freq: Two times a day (BID) | ORAL | 3 refills | Status: DC

## 2017-12-10 NOTE — Progress Notes (Signed)
Hematology and Oncology Follow Up Visit  Eric Mason [id number] 11/16/1970 47 y.o. 12/10/2017 4:12 PM Eric Mason, [name], Eric Batman, MD   Principle Diagnosis: 47 year old with stage IV poorly differentiated cancer with metastatic disease to the brain and spine diagnosed in May 2018.  Primary etiology likely arising from a lung neoplasm.  Prior Therapy:   Status post craniotomy and resection in May 2018. This was followed by [facility] completed in June 2018.  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  Carboplatin and paclitaxel with Pembrolizumab cycle 1 given on 06/18/2017.  He completed 4 cycles of carboplatin and paclitaxel and completed 6 cycles of Pembrolizumab total.  Current therapy: Active surveillance.  Interim History: [name] is here for a follow-up visit.  Since last visit, he reports no major changes in his health.  He continues to have chronic pain issue which is related to his metastatic disease to the spine as well as noncancer related back pain.  He continues to use oxycodone 15 mg around-the-clock which have helped his back pain.  His appetite has declined however lost few pounds.  He has used Megace in the past which have helped his symptoms.  He denies any respiratory complaints at this time including shortness of breath or difficulty breathing.  He denies any headaches blurred vision, syncope or seizures.  He denies any alteration in mental status or lethargy.  He does not report any any fevers or chills or sweats.   He denies any cough, wheezing or hemoptysis.  He denies any shortness of breath or dyspnea on exertion.  He denies any chest pain, palpitation, orthopnea or leg edema. He is not reporting nausea, vomiting or abdominal pain.  He denies any in his bowel habits.  He does not report any frequency urgency or hesitancy.  He denies  any bone pain or pathological fractures.  He does not report any skin rashes or lesions.  He does not report any lymphadenopathy or petechiae.  He denies any anxiety or depression. Remaining review of systems is negative.    Medications: I have reviewed the patient's current medications.  Current Outpatient Medications  Medication Sig Dispense Refill  . albuterol (PROVENTIL HFA;VENTOLIN HFA) 108 (90 Base) MCG/ACT inhaler Inhale 2 puffs into the lungs every 6 (six) hours as needed for wheezing or shortness of breath. 1 Inhaler 6  . amitriptyline (ELAVIL) 25 MG tablet Take 25 mg by mouth at bedtime.    . B Complex-C (B-COMPLEX WITH VITAMIN C) tablet Take 1 tablet by mouth daily.    . Calcium Carb-Cholecalciferol (CALCIUM 500 +D) 500-400 MG-UNIT TABS 1 tab PO BID 60 tablet 2  . dexamethasone (DECADRON) 4 MG tablet Take 1 tablet (4 mg total) by mouth 2 (two) times daily. 40 tablet 1  . DULoxetine (CYMBALTA) 30 MG capsule Take 1 capsule (30 mg total) by mouth daily. 30 capsule 5  . folic acid (FOLVITE) 1 MG tablet Take 1 tablet (1 mg total) by mouth daily. 100 tablet 1  . gabapentin (NEURONTIN) 600 MG tablet Take 1 tablet (600 mg total) by mouth 3 (three) times daily. 90 tablet 2  . levETIRAcetam (KEPPRA) 750 MG tablet Take 2 tablets (1,500 mg total) by mouth 2 (two) times daily. 120 tablet 3  . lidocaine-prilocaine (EMLA) cream Apply 1 application topically as needed. 30 g 0  . megestrol (MEGACE) 400 MG/10ML suspension Take 10 mLs (400 mg  total) by mouth 2 (two) times daily. 240 mL 3  . Multiple Vitamin (MULTIVITAMIN) tablet Take 1 tablet by mouth daily.    . nicotine (NICODERM CQ - DOSED IN MG/24 HOURS) 21 mg/24hr patch Place 1 patch (21 mg total) onto the skin daily. 28 patch 3  . ondansetron (ZOFRAN ODT) 4 MG disintegrating tablet Take 1 tablet (4 mg total) by mouth every 8 (eight) hours as needed for nausea or vomiting. 12 tablet 0  . ondansetron (ZOFRAN) 8 MG tablet Take 1 tablet (8 mg total) by  mouth every 8 (eight) hours as needed for nausea or vomiting. 30 tablet 3  . oxyCODONE (ROXICODONE) 15 MG immediate release tablet Take 1 tablet (15 mg total) by mouth every 4 (four) hours as needed for pain. 126 tablet 0  . pantoprazole (PROTONIX) 40 MG tablet Take 1 tablet (40 mg total) by mouth 2 (two) times daily. 60 tablet 0  . prochlorperazine (COMPAZINE) 10 MG tablet Take 1 tablet (10 mg total) by mouth every 6 (six) hours as needed for nausea or vomiting. 30 tablet 0  . ranolazine (RANEXA) 500 MG 12 hr tablet Take 1 tablet (500 mg total) by mouth 2 (two) times daily. 60 tablet 11  . senna-docusate (SENNA S) 8.6-50 MG tablet Take 1 tablet by mouth daily. 30 tablet 3  . thiamine (VITAMIN B-1) 100 MG tablet Take 1 tablet (100 mg total) by mouth daily. 100 tablet 0   No current facility-administered medications for this visit.    Facility-Administered Medications Ordered in Other Visits  Medication Dose Route Frequency Provider Last Rate Last Dose  . sodium chloride flush (NS) 0.9 % injection 10 mL  10 mL Intracatheter PRN Wyatt Portela, MD   10 mL at 06/18/17 1815     Allergies:  Allergies  Allergen Reactions  . Latex Itching and Rash    Past Medical History, Surgical history, Social history, and Family History reviewed today and remain unchanged.  Physical Exam:  Blood pressure (!) 146/100, pulse 70, temperature 98.5 F (36.9 C), temperature source Oral, resp. rate 17, height 5\' 10"  (1.778 m), weight 129 lb 1.6 oz (58.6 kg), SpO2 100 %.   ECOG: 1    General appearance: Comfortable appearing without any discomfort Head: Normocephalic without any trauma Oropharynx: Mucous membranes are moist and pink without any thrush or ulcers. Eyes: Pupils are equal and round reactive to light. Lymph nodes: No cervical, supraclavicular, inguinal or axillary lymphadenopathy.   Heart:regular rate and rhythm.  S1 and S2 without leg edema. Lung: Clear without any rhonchi or wheezes.  No  dullness to percussion. Abdomin: Soft, nontender, nondistended with good bowel sounds.  No hepatosplenomegaly. Musculoskeletal: No joint deformity or effusion.  Full range of motion noted. Neurological: No deficits noted on motor, sensory and deep tendon reflex exam. Skin: No petechial rash or dryness.  Appeared moist.     Lab Results: Lab Results  Component Value Date   WBC 6.6 10/02/2017   HGB 11.7 (L) 10/02/2017   HCT 35.3 (L) 10/02/2017   MCV 87.8 10/02/2017   PLT 298 10/02/2017     Chemistry      Component Value Date/Time   NA 137 10/02/2017 0834   NA 137 10/30/2016 0909   K 3.6 10/02/2017 0834   K 3.3 (L) 10/30/2016 0909   CL 103 10/02/2017 0834   CO2 25 10/02/2017 0834   CO2 27 10/30/2016 0909   BUN 8 10/02/2017 0834   BUN 4.8 (L) 10/30/2016 0932  CREATININE 0.83 10/02/2017 0834   CREATININE 0.9 10/30/2016 0909      Component Value Date/Time   CALCIUM 9.3 10/02/2017 0834   CALCIUM 9.3 10/30/2016 0909   ALKPHOS 66 10/02/2017 0834   ALKPHOS 93 10/30/2016 0909   AST 16 10/02/2017 0834   AST 35 (H) 10/30/2016 0909   ALT 12 10/02/2017 0834   ALT 25 10/30/2016 0909   BILITOT <0.2 (L) 10/02/2017 0834   BILITOT 0.41 10/30/2016 0909          Impression and Plan:  47 year old gentleman with the following issues:  1.  Stage IV poorly differentiated carcinoma likely arising from a lung primary with disease to the bone and brain.    He has completed systemic therapy utilizing carboplatin, paclitaxel and Pembrolizumab.  He is on active surveillance at this time without any evidence of recurrent systemic disease.  Different salvage therapy may be needed in the future if he develops metastatic disease.  He is scheduled to have repeat CT scan in the near future for staging purposes and I will be repeated in 6 months.  2.  CNS and spinal metastasis: Repeat MRI is scheduled in the near future for surveillance purposes.  3.  IV access: Port-A-Cath remains in place and  will be utilized in the future as needed.  And will be flushed every 2 months.  4.  Pain: He is currently on oxycodone 15 mg every 4 hours which is adequate in controlling his pain.  This was refilled for him today.  5.  Anorexia: Prescription for Megace was given to him today with instructions to use it.  This have helped his symptoms in the past.  6.  Prognosis and goals of care: Treatment is palliative although his performance status remains adequate and aggressive therapy is warranted.  7. Follow-up: Will be in 4 months follow his progress.  15 minutes was spent with the patient face-to-face today.  More than 50% of time was dedicated to reviewing the natural course of his disease, treatment options and correlating his plan of care.    Zola Button, MD 10/1/20194:12 PM

## 2017-12-11 ENCOUNTER — Other Ambulatory Visit: Payer: Self-pay | Admitting: Medical

## 2017-12-11 DIAGNOSIS — G893 Neoplasm related pain (acute) (chronic): Secondary | ICD-10-CM

## 2017-12-13 ENCOUNTER — Other Ambulatory Visit: Payer: Medicaid Other

## 2017-12-16 ENCOUNTER — Inpatient Hospital Stay: Payer: Medicaid Other | Admitting: Internal Medicine

## 2017-12-17 ENCOUNTER — Inpatient Hospital Stay: Payer: Medicaid Other

## 2017-12-20 ENCOUNTER — Other Ambulatory Visit: Payer: Self-pay | Admitting: Internal Medicine

## 2017-12-20 MED ORDER — LEVETIRACETAM 750 MG PO TABS: 1500.0000 mg | ORAL_TABLET | Freq: Two times a day (BID) | ORAL | 3 refills | Status: DC

## 2017-12-27 ENCOUNTER — Ambulatory Visit
Admission: RE | Admit: 2017-12-27 | Discharge: 2017-12-27 | Disposition: A | Discharge status: Home / Self Care | Payer: Medicaid Other | Source: Ambulatory Visit | Attending: Internal Medicine | Admitting: Internal Medicine

## 2017-12-27 DIAGNOSIS — C7931 Secondary malignant neoplasm of brain: Secondary | ICD-10-CM

## 2017-12-27 MED ORDER — GADOBENATE DIMEGLUMINE 529 MG/ML IV SOLN
12.0000 mL | Freq: Once | INTRAVENOUS | Status: AC | PRN
  Administered 2017-12-27: 12 mL via INTRAVENOUS

## 2017-12-30 ENCOUNTER — Inpatient Hospital Stay (HOSPITAL_BASED_OUTPATIENT_CLINIC_OR_DEPARTMENT_OTHER): Payer: Medicaid Other | Admitting: Internal Medicine

## 2017-12-30 ENCOUNTER — Encounter: Payer: Self-pay | Admitting: Internal Medicine

## 2017-12-30 ENCOUNTER — Inpatient Hospital Stay: Payer: Medicaid Other

## 2017-12-30 ENCOUNTER — Other Ambulatory Visit: Payer: Medicaid Other

## 2017-12-30 ENCOUNTER — Telehealth: Payer: Self-pay

## 2017-12-30 VITALS — BP 139/97 | HR 86 | Temp 98.4°F | Resp 18 | Ht 70.0 in | Wt 135.2 lb

## 2017-12-30 DIAGNOSIS — Z79899 Other long term (current) drug therapy: Secondary | ICD-10-CM

## 2017-12-30 DIAGNOSIS — C7931 Secondary malignant neoplasm of brain: Secondary | ICD-10-CM

## 2017-12-30 DIAGNOSIS — C801 Malignant (primary) neoplasm, unspecified: Secondary | ICD-10-CM | POA: Diagnosis not present

## 2017-12-30 DIAGNOSIS — Z95828 Presence of other vascular implants and grafts: Secondary | ICD-10-CM

## 2017-12-30 DIAGNOSIS — C7951 Secondary malignant neoplasm of bone: Secondary | ICD-10-CM | POA: Diagnosis not present

## 2017-12-30 DIAGNOSIS — R569 Unspecified convulsions: Secondary | ICD-10-CM

## 2017-12-30 DIAGNOSIS — Z923 Personal history of irradiation: Secondary | ICD-10-CM

## 2017-12-30 MED ORDER — AMITRIPTYLINE HCL 25 MG PO TABS: 25.0000 mg | ORAL_TABLET | Freq: Every day | ORAL | 5 refills | Status: DC

## 2017-12-30 MED ORDER — LORAZEPAM 1 MG PO TABS: 2.0000 mg | ORAL_TABLET | Freq: Three times a day (TID) | ORAL | 0 refills | Status: DC | PRN

## 2017-12-30 MED ORDER — SODIUM CHLORIDE 0.9% FLUSH
10.0000 mL | Freq: Once | INTRAVENOUS | Status: AC
  Administered 2017-12-30: 10 mL
  Filled 2017-12-30: qty 10

## 2017-12-30 MED ORDER — HEPARIN SOD (PORK) LOCK FLUSH 100 UNIT/ML IV SOLN
500.0000 [IU] | Freq: Once | INTRAVENOUS | Status: AC
  Administered 2017-12-30: 500 [IU]
  Filled 2017-12-30: qty 5

## 2017-12-30 NOTE — Addendum Note (Signed)
Encounter addended by: Hayden Pedro, PA-C on: 12/30/2017 8:07 AM  Actions taken: Sign clinical note
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
## 2017-12-30 NOTE — Telephone Encounter (Signed)
Printed avs and calender of upcoming appointment. Per 10/21 los 

## 2017-12-31 NOTE — Progress Notes (Signed)
Brain and Spine Tumor Board Documentation  Eric Mason was presented by Cecil Cobbs, MD at Brain and Spine Tumor Board on 12/31/2017, which included representatives from neuro oncology, radiation oncology, surgical oncology, navigation, pathology, radiology.  Eric Mason was presented as a current patient with history of the following treatments:  .  Additionally, we reviewed previous medical and familial history, history of present illness, and recent lab results along with all available histopathologic and imaging studies. The tumor board considered available treatment options and made the following recommendations:  Active surveillance MRI changes likely treatment related  Tumor board is a meeting of clinicians from various specialty areas who evaluate and discuss patients for whom a multidisciplinary approach is being considered. Final determinations in the plan of care are those of the provider(s). The responsibility for follow up of recommendations given during tumor board is that of the provider.   Today's extended care, comprehensive team conference, Eric Mason was not present for the discussion and was not examined.

## 2018-01-07 ENCOUNTER — Other Ambulatory Visit: Payer: Self-pay

## 2018-01-07 DIAGNOSIS — M79605 Pain in left leg: Secondary | ICD-10-CM

## 2018-01-07 DIAGNOSIS — C763 Malignant neoplasm of pelvis: Secondary | ICD-10-CM

## 2018-01-07 DIAGNOSIS — M79604 Pain in right leg: Secondary | ICD-10-CM

## 2018-01-07 DIAGNOSIS — C7931 Secondary malignant neoplasm of brain: Secondary | ICD-10-CM

## 2018-01-07 DIAGNOSIS — C3490 Malignant neoplasm of unspecified part of unspecified bronchus or lung: Secondary | ICD-10-CM

## 2018-01-07 MED ORDER — OXYCODONE HCL 15 MG PO TABS: 15.0000 mg | ORAL_TABLET | ORAL | 0 refills | Status: DC | PRN

## 2018-01-19 ENCOUNTER — Other Ambulatory Visit: Payer: Medicaid Other

## 2018-01-29 ENCOUNTER — Telehealth: Payer: Self-pay

## 2018-01-29 NOTE — Telephone Encounter (Signed)
Received a call from the patient requesting a refill of the ativan. Explained that is too soon to refill as it is prn for emergency seizure use. Patient stated that he is having about 2 seizures a week and has no more medication on hand. After speaking with Dr. Mickeal Skinner a scheduling message has been sent for follow up appointment and patient is aware that an appointment will be made. He had no other questions or concerns.

## 2018-01-30 ENCOUNTER — Telehealth: Payer: Self-pay | Admitting: Internal Medicine

## 2018-01-30 NOTE — Telephone Encounter (Signed)
Scheduled appt per 11/20 sch message - left message for patient with appt date and time.

## 2018-02-04 ENCOUNTER — Inpatient Hospital Stay: Payer: Medicaid Other | Attending: Internal Medicine

## 2018-02-04 ENCOUNTER — Other Ambulatory Visit: Payer: Self-pay | Admitting: Oncology

## 2018-02-04 ENCOUNTER — Telehealth: Payer: Self-pay | Admitting: Internal Medicine

## 2018-02-04 ENCOUNTER — Inpatient Hospital Stay (HOSPITAL_BASED_OUTPATIENT_CLINIC_OR_DEPARTMENT_OTHER): Payer: Medicaid Other | Admitting: Internal Medicine

## 2018-02-04 VITALS — BP 118/86 | HR 84 | Temp 98.7°F | Resp 18 | Ht 70.0 in | Wt 139.9 lb

## 2018-02-04 DIAGNOSIS — F17200 Nicotine dependence, unspecified, uncomplicated: Secondary | ICD-10-CM | POA: Diagnosis not present

## 2018-02-04 DIAGNOSIS — Z79899 Other long term (current) drug therapy: Secondary | ICD-10-CM | POA: Diagnosis not present

## 2018-02-04 DIAGNOSIS — C7931 Secondary malignant neoplasm of brain: Secondary | ICD-10-CM

## 2018-02-04 DIAGNOSIS — G893 Neoplasm related pain (acute) (chronic): Secondary | ICD-10-CM

## 2018-02-04 DIAGNOSIS — R569 Unspecified convulsions: Secondary | ICD-10-CM | POA: Insufficient documentation

## 2018-02-04 DIAGNOSIS — Z95828 Presence of other vascular implants and grafts: Secondary | ICD-10-CM

## 2018-02-04 DIAGNOSIS — M79604 Pain in right leg: Secondary | ICD-10-CM

## 2018-02-04 DIAGNOSIS — C763 Malignant neoplasm of pelvis: Secondary | ICD-10-CM

## 2018-02-04 DIAGNOSIS — C3490 Malignant neoplasm of unspecified part of unspecified bronchus or lung: Secondary | ICD-10-CM

## 2018-02-04 DIAGNOSIS — M79605 Pain in left leg: Secondary | ICD-10-CM

## 2018-02-04 MED ORDER — LORAZEPAM 1 MG PO TABS: 2.0000 mg | ORAL_TABLET | Freq: Three times a day (TID) | ORAL | 0 refills | Status: DC | PRN

## 2018-02-04 MED ORDER — DULOXETINE HCL 30 MG PO CPEP: 30.0000 mg | ORAL_CAPSULE | Freq: Every day | ORAL | 5 refills | Status: DC

## 2018-02-04 MED ORDER — SODIUM CHLORIDE 0.9% FLUSH
10.0000 mL | Freq: Once | INTRAVENOUS | Status: AC
  Administered 2018-02-04: 10 mL
  Filled 2018-02-04: qty 10

## 2018-02-04 MED ORDER — LEVETIRACETAM 1000 MG PO TABS: 2000.0000 mg | ORAL_TABLET | Freq: Two times a day (BID) | ORAL | 2 refills | Status: DC

## 2018-02-04 MED ORDER — HEPARIN SOD (PORK) LOCK FLUSH 100 UNIT/ML IV SOLN
500.0000 [IU] | Freq: Once | INTRAVENOUS | Status: AC
  Administered 2018-02-04: 500 [IU]
  Filled 2018-02-04: qty 5

## 2018-02-04 MED ORDER — OXYCODONE HCL 15 MG PO TABS: 15.0000 mg | ORAL_TABLET | ORAL | 0 refills | Status: DC | PRN
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
## 2018-02-04 NOTE — Telephone Encounter (Signed)
Pt requested to r/s appts for the same day in January so that he doesn't have to come in multiple times.

## 2018-02-05 ENCOUNTER — Telehealth: Payer: Self-pay

## 2018-02-05 NOTE — Telephone Encounter (Signed)
Per 11/26 no los

## 2018-02-13 ENCOUNTER — Other Ambulatory Visit: Payer: Self-pay | Admitting: *Deleted

## 2018-02-13 DIAGNOSIS — C3490 Malignant neoplasm of unspecified part of unspecified bronchus or lung: Secondary | ICD-10-CM

## 2018-02-13 MED ORDER — MEGESTROL ACETATE 400 MG/10ML PO SUSP: 400.0000 mg | Freq: Two times a day (BID) | ORAL | 3 refills | Status: DC

## 2018-02-25 ENCOUNTER — Other Ambulatory Visit: Payer: Self-pay | Admitting: Oncology

## 2018-02-25 ENCOUNTER — Telehealth: Payer: Self-pay | Admitting: *Deleted

## 2018-02-25 DIAGNOSIS — M79604 Pain in right leg: Secondary | ICD-10-CM

## 2018-02-25 DIAGNOSIS — M79605 Pain in left leg: Secondary | ICD-10-CM

## 2018-02-25 DIAGNOSIS — C763 Malignant neoplasm of pelvis: Secondary | ICD-10-CM

## 2018-02-25 DIAGNOSIS — C7931 Secondary malignant neoplasm of brain: Secondary | ICD-10-CM

## 2018-02-25 DIAGNOSIS — C3490 Malignant neoplasm of unspecified part of unspecified bronchus or lung: Secondary | ICD-10-CM

## 2018-02-25 MED ORDER — OXYCODONE HCL 15 MG PO TABS: 15.0000 mg | ORAL_TABLET | ORAL | 0 refills | Status: DC | PRN

## 2018-02-25 NOTE — Telephone Encounter (Signed)
-----   Message from Wyatt Portela, MD sent at 02/25/2018  4:15 PM EST ----- Rx sent to his pharmacy.  ----- Message ----- From: Randolm Idol, RN Sent: 02/25/2018   4:07 PM EST To: Wyatt Portela, MD  Wife heather calling to request a refill on oxycodone 15 mg tablets. Kimie Pidcock

## 2018-02-25 NOTE — Telephone Encounter (Signed)
Spoke with Eric Mason. Dr  Alen Blew sent script for oxycodone 15 mg tablets to patient's pharmacy

## 2018-02-25 NOTE — Telephone Encounter (Signed)
Wife heather calling for refill on oxycodone 15 mg

## 2018-03-18 ENCOUNTER — Telehealth: Payer: Self-pay

## 2018-03-18 ENCOUNTER — Other Ambulatory Visit: Payer: Self-pay | Admitting: Oncology

## 2018-03-18 DIAGNOSIS — C3490 Malignant neoplasm of unspecified part of unspecified bronchus or lung: Secondary | ICD-10-CM

## 2018-03-18 DIAGNOSIS — M79604 Pain in right leg: Secondary | ICD-10-CM

## 2018-03-18 DIAGNOSIS — C7931 Secondary malignant neoplasm of brain: Secondary | ICD-10-CM

## 2018-03-18 DIAGNOSIS — C763 Malignant neoplasm of pelvis: Secondary | ICD-10-CM

## 2018-03-18 DIAGNOSIS — M79605 Pain in left leg: Secondary | ICD-10-CM

## 2018-03-18 MED ORDER — OXYCODONE HCL 15 MG PO TABS: 15.0000 mg | ORAL_TABLET | ORAL | 0 refills | Status: DC | PRN

## 2018-03-18 NOTE — Telephone Encounter (Signed)
Patient spouse [name] called requesting refill of Oxycodone 15mg  tablets. She also stated that the patient is having increased back pain and headaches and the patient is not getting much relief from the oxycodone. She is requesting "stronger" pain management. Dr. Alen Blew aware and will send in refill for oxycodone, will discuss pain management at the next scheduled appointment 1/28. Patient and spouse aware, agreeable with plan, and no other questions or concerns.

## 2018-03-26 ENCOUNTER — Other Ambulatory Visit: Payer: Self-pay | Admitting: Radiation Therapy

## 2018-03-28 ENCOUNTER — Inpatient Hospital Stay: Admission: RE | Admit: 2018-03-28 | Payer: Medicaid Other | Source: Ambulatory Visit

## 2018-03-31 ENCOUNTER — Inpatient Hospital Stay: Payer: Medicaid Other

## 2018-03-31 ENCOUNTER — Ambulatory Visit: Payer: Medicaid Other | Admitting: Internal Medicine

## 2018-04-05 ENCOUNTER — Ambulatory Visit
Admission: RE | Admit: 2018-04-05 | Discharge: 2018-04-05 | Disposition: A | Discharge status: Home / Self Care | Payer: Medicaid Other | Source: Ambulatory Visit | Attending: Internal Medicine | Admitting: Internal Medicine

## 2018-04-05 DIAGNOSIS — C7931 Secondary malignant neoplasm of brain: Secondary | ICD-10-CM

## 2018-04-05 MED ORDER — GADOBENATE DIMEGLUMINE 529 MG/ML IV SOLN
12.0000 mL | Freq: Once | INTRAVENOUS | Status: AC | PRN
  Administered 2018-04-05: 12 mL via INTRAVENOUS

## 2018-04-08 ENCOUNTER — Inpatient Hospital Stay (HOSPITAL_BASED_OUTPATIENT_CLINIC_OR_DEPARTMENT_OTHER): Payer: Medicaid Other | Admitting: Oncology

## 2018-04-08 ENCOUNTER — Inpatient Hospital Stay: Payer: Medicaid Other | Attending: Internal Medicine

## 2018-04-08 ENCOUNTER — Telehealth: Payer: Self-pay | Admitting: Oncology

## 2018-04-08 ENCOUNTER — Other Ambulatory Visit: Payer: Medicaid Other

## 2018-04-08 ENCOUNTER — Ambulatory Visit: Payer: Medicaid Other | Admitting: Oncology

## 2018-04-08 ENCOUNTER — Inpatient Hospital Stay (HOSPITAL_BASED_OUTPATIENT_CLINIC_OR_DEPARTMENT_OTHER): Payer: Medicaid Other | Admitting: Internal Medicine

## 2018-04-08 ENCOUNTER — Inpatient Hospital Stay: Payer: Medicaid Other

## 2018-04-08 DIAGNOSIS — C3490 Malignant neoplasm of unspecified part of unspecified bronchus or lung: Secondary | ICD-10-CM

## 2018-04-08 DIAGNOSIS — Z79899 Other long term (current) drug therapy: Secondary | ICD-10-CM | POA: Diagnosis not present

## 2018-04-08 DIAGNOSIS — C7931 Secondary malignant neoplasm of brain: Secondary | ICD-10-CM

## 2018-04-08 DIAGNOSIS — C7951 Secondary malignant neoplasm of bone: Secondary | ICD-10-CM

## 2018-04-08 DIAGNOSIS — M79604 Pain in right leg: Secondary | ICD-10-CM

## 2018-04-08 DIAGNOSIS — R569 Unspecified convulsions: Secondary | ICD-10-CM | POA: Diagnosis not present

## 2018-04-08 DIAGNOSIS — M79605 Pain in left leg: Secondary | ICD-10-CM

## 2018-04-08 DIAGNOSIS — C763 Malignant neoplasm of pelvis: Secondary | ICD-10-CM

## 2018-04-08 DIAGNOSIS — Z95828 Presence of other vascular implants and grafts: Secondary | ICD-10-CM

## 2018-04-08 LAB — CMP (CANCER CENTER ONLY)
ALT: 10 U/L (ref 0–44)
ANION GAP: 9 (ref 5–15)
AST: 14 U/L — ABNORMAL LOW (ref 15–41)
Albumin: 3.6 g/dL (ref 3.5–5.0)
Alkaline Phosphatase: 71 U/L (ref 38–126)
BUN: 7 mg/dL (ref 6–20)
CO2: 23 mmol/L (ref 22–32)
Calcium: 8.8 mg/dL — ABNORMAL LOW (ref 8.9–10.3)
Chloride: 106 mmol/L (ref 98–111)
Creatinine: 0.92 mg/dL (ref 0.61–1.24)
GFR, Est AFR Am: >60 mL/min (ref >60)
GFR, Estimated: >60 mL/min (ref >60)
Glucose, Bld: 116 mg/dL — ABNORMAL HIGH (ref 70–99)
POTASSIUM: 3.3 mmol/L — AB (ref 3.5–5.1)
Sodium: 138 mmol/L (ref 135–145)
Total Bilirubin: 0.4 mg/dL (ref 0.3–1.2)
Total Protein: 7.3 g/dL (ref 6.5–8.1)

## 2018-04-08 LAB — CBC WITH DIFFERENTIAL (CANCER CENTER ONLY)
Abs Immature Granulocytes: 0.01 10*3/uL (ref 0.00–0.07)
Basophils Absolute: 0 10*3/uL (ref 0.0–0.1)
Basophils Relative: 1 %
Eosinophils Absolute: 0.1 10*3/uL (ref 0.0–0.5)
Eosinophils Relative: 1 %
HCT: 31.6 % — ABNORMAL LOW (ref 39.0–52.0)
Hemoglobin: 10.5 g/dL — ABNORMAL LOW (ref 13.0–17.0)
IMMATURE GRANULOCYTES: 0 %
LYMPHS PCT: 55 %
Lymphs Abs: 3.4 10*3/uL (ref 0.7–4.0)
MCH: 29.1 pg (ref 26.0–34.0)
MCHC: 33.2 g/dL (ref 30.0–36.0)
MCV: 87.5 fL (ref 80.0–100.0)
Monocytes Absolute: 0.4 10*3/uL (ref 0.1–1.0)
Monocytes Relative: 7 %
NEUTROS ABS: 2.2 10*3/uL (ref 1.7–7.7)
NEUTROS PCT: 36 %
PLATELETS: 261 10*3/uL (ref 150–400)
RBC: 3.61 MIL/uL — ABNORMAL LOW (ref 4.22–5.81)
RDW: 13.7 % (ref 11.5–15.5)
WBC Count: 6.1 10*3/uL (ref 4.0–10.5)
nRBC: 0 % (ref 0.0–0.2)

## 2018-04-08 MED ORDER — OXYCODONE HCL 15 MG PO TABS: 15.0000 mg | ORAL_TABLET | ORAL | 0 refills | Status: DC | PRN

## 2018-04-08 MED ORDER — HEPARIN SOD (PORK) LOCK FLUSH 100 UNIT/ML IV SOLN
500.0000 [IU] | Freq: Once | INTRAVENOUS | Status: AC
  Administered 2018-04-08: 500 [IU]
  Filled 2018-04-08: qty 5

## 2018-04-08 MED ORDER — SODIUM CHLORIDE 0.9% FLUSH
10.0000 mL | Freq: Once | INTRAVENOUS | Status: AC
  Administered 2018-04-08: 10 mL
  Filled 2018-04-08: qty 10

## 2018-04-08 MED ORDER — GABAPENTIN 600 MG PO TABS: 600.0000 mg | ORAL_TABLET | Freq: Three times a day (TID) | ORAL | 3 refills | Status: DC

## 2018-04-08 MED ORDER — ALBUTEROL SULFATE HFA 108 (90 BASE) MCG/ACT IN AERS: 2.0000 | INHALATION_SPRAY | Freq: Four times a day (QID) | RESPIRATORY_TRACT | 6 refills | Status: DC | PRN

## 2018-04-08 NOTE — Progress Notes (Signed)
Hematology and Oncology Follow Up Visit  Eric Mason [id number] 11-20-1970 48 y.o. 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  He is final pathology showed poorly differentiated carcinoma.  Prior Therapy:   Status post craniotomy and resection in May 2018. This was followed by [facility] completed in June 2018.  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  Carboplatin and paclitaxel with Pembrolizumab cycle 1 given on 06/18/2017.  He completed 4 cycles of carboplatin and paclitaxel and completed 6 cycles of Pembrolizumab total.  Current therapy: Active surveillance.  Interim History: Eric Mason is here for a repeat evaluation.  Since the last visit, he reports few complaints have been chronic mostly.  Does report some mild headaches although no visual changes syncope or alteration in his mentation.  He does report some periodic arthralgias and myalgias for which she takes chronic pain medication.  He is using oxycodone and takes for to 5 tablets every day.  Pain is overall manageable.  He denies any difficulties with ambulation or recent seizures.  His appetite has been reasonable although lost few pounds.  He denies any other new respiratory complaints.  He denies any recent hospitalizations or illnesses.  He denies any confusion or dizziness.  He does not report any any fevers or chills or sweats.   He denies any cough, wheezing or hemoptysis.  He denies any dyspnea on exertion.  Denies any chest pain, palpitation, orthopnea or leg edema. He is not reporting nausea, vomiting or abdominal distention.  He denies any constipation or diarrhea.  He does not report any frequency urgency or hesitancy.  He denies any  arthralgias or myalgias.  He does not report any ecchymosis or petechiae he denies any changes in his mood.  Remaining review of systems is negative.    Medications: I have reviewed the patient's current medications.  Current Outpatient Medications  Medication Sig Dispense Refill  . albuterol (PROVENTIL HFA;VENTOLIN HFA) 108 (90 Base) MCG/ACT inhaler Inhale 2 puffs into the lungs every 6 (six) hours as needed for wheezing or shortness of breath. 1 Inhaler 6  . amitriptyline (ELAVIL) 25 MG tablet Take 1 tablet (25 mg total) by mouth at bedtime. 30 tablet 5  . B Complex-C (B-COMPLEX WITH VITAMIN C) tablet Take 1 tablet by mouth daily.    . Calcium Carb-Cholecalciferol (CALCIUM 500 +D) 500-400 MG-UNIT TABS 1 tab PO BID 60 tablet 2  . DULoxetine (CYMBALTA) 30 MG capsule Take 1 capsule (30 mg total) by mouth daily. 30 capsule 5  . folic acid (FOLVITE) 1 MG tablet Take 1 tablet (1 mg total) by mouth daily. 100 tablet 1  . gabapentin (NEURONTIN) 600 MG tablet Take 1 tablet (600 mg total) by mouth 3 (three) times daily. 90 tablet 2  . levETIRAcetam (KEPPRA) 1000 MG tablet Take 2 tablets (2,000 mg total) by mouth 2 (two) times daily. 120 tablet 2  . lidocaine-prilocaine (EMLA) cream Apply 1 application topically as needed. 30 g 0  . LORazepam (ATIVAN) 1 MG tablet Take 2 tablets (2 mg total) by mouth every 8 (eight) hours as needed for seizure (emergency med for focal seizure). 20 tablet 0  . megestrol (MEGACE) 400 MG/10ML suspension Take 10 mLs (400 mg total) by mouth 2 (two) times daily. 240 mL 3  .  Multiple Vitamin (MULTIVITAMIN) tablet Take 1 tablet by mouth daily.    . nicotine (NICODERM CQ - DOSED IN MG/24 HOURS) 21 mg/24hr patch Place 1 patch (21 mg total) onto the skin daily. 28 patch 3  . ondansetron (ZOFRAN ODT) 4 MG disintegrating tablet Take 1 tablet (4 mg total) by mouth every 8 (eight) hours as needed for nausea or vomiting. 12 tablet 0  . ondansetron (ZOFRAN) 8 MG tablet Take 1 tablet (8 mg  total) by mouth every 8 (eight) hours as needed for nausea or vomiting. 30 tablet 3  . oxyCODONE (ROXICODONE) 15 MG immediate release tablet Take 1 tablet (15 mg total) by mouth every 4 (four) hours as needed for pain. 126 tablet 0  . pantoprazole (PROTONIX) 40 MG tablet Take 1 tablet (40 mg total) by mouth 2 (two) times daily. 60 tablet 0  . prochlorperazine (COMPAZINE) 10 MG tablet Take 1 tablet (10 mg total) by mouth every 6 (six) hours as needed for nausea or vomiting. 30 tablet 0  . ranolazine (RANEXA) 500 MG 12 hr tablet Take 1 tablet (500 mg total) by mouth 2 (two) times daily. 60 tablet 11  . senna-docusate (SENNA S) 8.6-50 MG tablet Take 1 tablet by mouth daily. 30 tablet 3  . thiamine (VITAMIN B-1) 100 MG tablet Take 1 tablet (100 mg total) by mouth daily. 100 tablet 0   No current facility-administered medications for this visit.    Facility-Administered Medications Ordered in Other Visits  Medication Dose Route Frequency Provider Last Rate Last Dose  . sodium chloride flush (NS) 0.9 % injection 10 mL  10 mL Intracatheter PRN Wyatt Portela, MD   10 mL at 06/18/17 1815     Allergies:  Allergies  Allergen Reactions  . Latex Itching and Rash    Past Medical History, Surgical history, Social history, and Family History reviewed today and remain unchanged.  Physical Exam:  Blood pressure 110/75, pulse 69, temperature 98.2 F (36.8 C), temperature source Oral, resp. rate 19, height 5\' 10"  (1.778 m), weight 135 lb 1.6 oz (61.3 kg), SpO2 100 %.   ECOG: 1    General appearance: Alert, awake without any distress. Head: Atraumatic without abnormalities Oropharynx: Without any thrush or ulcers. Eyes: No scleral icterus. Lymph nodes: No lymphadenopathy noted in the cervical, supraclavicular, or axillary nodes Heart:regular rate and rhythm, without any murmurs or gallops.   Lung: Clear to auscultation without any rhonchi, wheezes or dullness to percussion. Abdomin: Soft,  nontender without any shifting dullness or ascites. Musculoskeletal: No clubbing or cyanosis. Neurological: No deficits noted today on exam.  Is ambulating without any difficulties. Skin: No rashes or lesions.      Lab Results: Lab Results  Component Value Date   WBC 6.6 10/02/2017   HGB 11.7 (L) 10/02/2017   HCT 35.3 (L) 10/02/2017   MCV 87.8 10/02/2017   PLT 298 10/02/2017     Chemistry      Component Value Date/Time   NA 137 10/02/2017 0834   NA 137 10/30/2016 0909   K 3.6 10/02/2017 0834   K 3.3 (L) 10/30/2016 0909   CL 103 10/02/2017 0834   CO2 25 10/02/2017 0834   CO2 27 10/30/2016 0909   BUN 8 10/02/2017 0834   BUN 4.8 (L) 10/30/2016 0909   CREATININE 0.83 10/02/2017 0834   CREATININE 0.9 10/30/2016 0909      Component Value Date/Time   CALCIUM 9.3 10/02/2017 0834   CALCIUM 9.3 10/30/2016 0909   ALKPHOS  66 10/02/2017 0834   ALKPHOS 93 10/30/2016 0909   AST 16 10/02/2017 0834   AST 35 (H) 10/30/2016 0909   ALT 12 10/02/2017 0834   ALT 25 10/30/2016 0909   BILITOT <0.2 (L) 10/02/2017 0834   BILITOT 0.41 10/30/2016 0909          Impression and Plan:  48 year old gentleman with the following issues:  1.  Metastatic carcinoma presented with brain involvement arising from a lung primary diagnosed in May 2018.   He is status post therapy outlined above without any recent systemic relapse.  He was scheduled to have repeat imaging studies in September 2019 but that was not completed.  The plan is to repeat imaging studies in the immediate future and assess his disease status and use systemic therapy if he develops systemic disease.  He is agreeable with this plan.   2.  CNS and spinal metastasis: MRI obtained in January 2020 showed slight increase in vasogenic edema that could indicate radiation necrosis versus disease relapse.  He will be evaluated by Dr. Mickeal Skinner today for these findings  3.  IV access: Port-A-Cath has been flushed periodically.  4.   Pain: Manageable with the current regimen.  Oxycodone was refilled for her today.  5.  Anorexia: We have discussed strategies to boost his appetite.  We have also discussed nutritional supplements.  6.  Prognosis and goals of care: He has incurable disease and any treatment is palliative at this time.  His performance status remain excellent and aggressive therapy is warranted.  7. Follow-up: Will be a repeat evaluation in 4 months.  15 minutes was spent with the patient face-to-face today.  More than 50% of time was dedicated to discussing disease status, treatment options and answering questions regarding his future cancer management.     Zola Button, MD 1/28/202010:21 AM
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
## 2018-04-08 NOTE — Telephone Encounter (Signed)
Gave AVS and calendar. Gave patient contrast

## 2018-04-10 ENCOUNTER — Telehealth: Payer: Self-pay

## 2018-04-10 NOTE — Telephone Encounter (Signed)
Per 1/28 los Spoke with patient concerning upcoming appointment with [facility]. Mailed a letter with a calender enclosed

## 2018-04-16 ENCOUNTER — Telehealth: Payer: Self-pay | Admitting: Cardiology

## 2018-04-16 NOTE — Telephone Encounter (Signed)
°*  STAT* If patient is at the pharmacy, call can be transferred to refill team.   1. Which medications need to be refilled? (please list name of each medication and dose if known) Ranaxa 500mg  12 hr tablet  2. Which pharmacy/location (including street and city if local pharmacy) is medication to be sent to?[facility]  3. Do they need a 30 day or 90 day supply? [place]

## 2018-04-16 NOTE — Telephone Encounter (Signed)
Patient informed an appointment is needed for refills. He hasn't seen Dr. Agustin Cree since October 2018. Patient verbally understands we can't refill until a appointment is made, he will cal back to make an appointment.

## 2018-04-17 ENCOUNTER — Ambulatory Visit: Payer: Medicaid Other | Admitting: Internal Medicine

## 2018-04-17 ENCOUNTER — Other Ambulatory Visit: Payer: Self-pay | Admitting: Internal Medicine

## 2018-04-17 MED ORDER — DEXAMETHASONE 4 MG PO TABS: 4.0000 mg | ORAL_TABLET | Freq: Two times a day (BID) | ORAL | 0 refills | Status: DC

## 2018-04-22 ENCOUNTER — Other Ambulatory Visit: Payer: Self-pay | Admitting: Oncology

## 2018-04-22 DIAGNOSIS — M79605 Pain in left leg: Secondary | ICD-10-CM

## 2018-04-22 DIAGNOSIS — C763 Malignant neoplasm of pelvis: Secondary | ICD-10-CM

## 2018-04-22 DIAGNOSIS — M79604 Pain in right leg: Secondary | ICD-10-CM

## 2018-04-22 DIAGNOSIS — C3490 Malignant neoplasm of unspecified part of unspecified bronchus or lung: Secondary | ICD-10-CM

## 2018-04-22 DIAGNOSIS — C7931 Secondary malignant neoplasm of brain: Secondary | ICD-10-CM

## 2018-04-22 MED ORDER — OXYCODONE HCL 15 MG PO TABS: 15.0000 mg | ORAL_TABLET | ORAL | 0 refills | Status: DC | PRN

## 2018-04-25 ENCOUNTER — Telehealth: Payer: Self-pay | Admitting: *Deleted

## 2018-04-25 ENCOUNTER — Inpatient Hospital Stay: Payer: Medicaid Other | Admitting: Internal Medicine

## 2018-04-25 NOTE — Telephone Encounter (Signed)
Wife left message asking if Dub had to come in today. Reports no seizures, and stumbling is not as noticeable since he has been on steroids. Wife's mother is in ICU in [place].  Dr Mickeal Skinner states Eric Mason does not have to come in today- He will refill steroids.  [place] notified.  Appt cancelled

## 2018-04-29 ENCOUNTER — Other Ambulatory Visit: Payer: Self-pay | Admitting: Internal Medicine

## 2018-04-29 MED ORDER — DEXAMETHASONE 4 MG PO TABS: 4.0000 mg | ORAL_TABLET | Freq: Every day | ORAL | 3 refills | Status: DC

## 2018-04-29 MED ORDER — NYSTATIN 100000 UNIT/ML MT SUSP: 5.0000 mL | Freq: Four times a day (QID) | OROMUCOSAL | 0 refills | Status: DC

## 2018-05-12 ENCOUNTER — Other Ambulatory Visit: Payer: Self-pay | Admitting: Oncology

## 2018-05-12 DIAGNOSIS — M79605 Pain in left leg: Secondary | ICD-10-CM

## 2018-05-12 DIAGNOSIS — C763 Malignant neoplasm of pelvis: Secondary | ICD-10-CM

## 2018-05-12 DIAGNOSIS — C3490 Malignant neoplasm of unspecified part of unspecified bronchus or lung: Secondary | ICD-10-CM

## 2018-05-12 DIAGNOSIS — M79604 Pain in right leg: Secondary | ICD-10-CM

## 2018-05-12 DIAGNOSIS — C7931 Secondary malignant neoplasm of brain: Secondary | ICD-10-CM

## 2018-05-12 MED ORDER — OXYCODONE HCL 15 MG PO TABS: 15.0000 mg | ORAL_TABLET | ORAL | 0 refills | Status: DC | PRN

## 2018-05-16 ENCOUNTER — Other Ambulatory Visit: Payer: Self-pay | Admitting: Internal Medicine

## 2018-05-16 ENCOUNTER — Telehealth: Payer: Self-pay

## 2018-05-16 MED ORDER — NYSTATIN 100000 UNIT/ML MT SUSP: 5.0000 mL | Freq: Four times a day (QID) | OROMUCOSAL | 3 refills | Status: DC

## 2018-05-16 NOTE — Telephone Encounter (Signed)
Received call from patient requesting refills on Dexamethasone and Nystatin. Dr. Mickeal Skinner sent in refills for the Nystatin and this RN explained that there are refills available for the Dexamethasone and to call the pharmacy to have refilled. Eric Mason called back stating that the pharmacy cannot refill the Dexamethasone because it is too early. Eric Mason then stated that the patient has been taking 2 tabs daily instead of 1 tablet daily and that is why they ran out early. She stated that they were not aware he should have been taking it 1 time daily. Clarified the dosage of 1 4mg  tablet daily with Dr. Mickeal Skinner. Contacted the pharmacy and pharmacist stated that [place] will not cover refill and they will have to pay out of pocket and they can offer a discounted price of $6. Contacted Eric Mason and encouraged her to call the pharmacy back with the above information and that the pharmacy will work with them with a discounted price. Had Eric Mason repeat back the dosing of the patient taking 1 tablet daily ofd the 4mg  prednisone.

## 2018-05-27 ENCOUNTER — Other Ambulatory Visit: Payer: Self-pay | Admitting: Oncology

## 2018-05-27 DIAGNOSIS — M79605 Pain in left leg: Secondary | ICD-10-CM

## 2018-05-27 DIAGNOSIS — C763 Malignant neoplasm of pelvis: Secondary | ICD-10-CM

## 2018-05-27 DIAGNOSIS — M79604 Pain in right leg: Secondary | ICD-10-CM

## 2018-05-27 DIAGNOSIS — C7931 Secondary malignant neoplasm of brain: Secondary | ICD-10-CM

## 2018-05-27 DIAGNOSIS — C3490 Malignant neoplasm of unspecified part of unspecified bronchus or lung: Secondary | ICD-10-CM

## 2018-05-27 MED ORDER — OXYCODONE HCL 15 MG PO TABS: 15.0000 mg | ORAL_TABLET | ORAL | 0 refills | Status: DC | PRN

## 2018-06-03 ENCOUNTER — Telehealth: Payer: Self-pay | Admitting: *Deleted

## 2018-06-03 ENCOUNTER — Inpatient Hospital Stay: Payer: Medicaid Other | Attending: Internal Medicine

## 2018-06-03 NOTE — Telephone Encounter (Signed)
Spoke with patient, d/t productive cough, he will call scheduling and r/e appt for lab/flush and dr vaslow.

## 2018-06-05 ENCOUNTER — Telehealth: Payer: Self-pay | Admitting: Oncology

## 2018-06-05 NOTE — Telephone Encounter (Signed)
Patient called to reschedule  °

## 2018-06-12 ENCOUNTER — Other Ambulatory Visit: Payer: Self-pay | Admitting: Oncology

## 2018-06-12 ENCOUNTER — Telehealth: Payer: Self-pay

## 2018-06-12 ENCOUNTER — Other Ambulatory Visit: Payer: Self-pay | Admitting: Internal Medicine

## 2018-06-12 DIAGNOSIS — C3490 Malignant neoplasm of unspecified part of unspecified bronchus or lung: Secondary | ICD-10-CM

## 2018-06-12 DIAGNOSIS — C7931 Secondary malignant neoplasm of brain: Secondary | ICD-10-CM

## 2018-06-12 DIAGNOSIS — M79605 Pain in left leg: Secondary | ICD-10-CM

## 2018-06-12 DIAGNOSIS — M79604 Pain in right leg: Secondary | ICD-10-CM

## 2018-06-12 DIAGNOSIS — C763 Malignant neoplasm of pelvis: Secondary | ICD-10-CM

## 2018-06-12 MED ORDER — LEVETIRACETAM 1000 MG PO TABS: 2000.0000 mg | ORAL_TABLET | Freq: Two times a day (BID) | ORAL | 2 refills | Status: DC

## 2018-06-12 MED ORDER — OXYCODONE HCL 15 MG PO TABS: 15.0000 mg | ORAL_TABLET | ORAL | 0 refills | Status: DC | PRN

## 2018-06-12 NOTE — Telephone Encounter (Signed)
Received call from patient partner [name] requesting medications refills. Explained that Dr. Alen Blew has sent in refill for Oxy but the request for the Keppra refill was sent to [place] [place] RN since Dr. Mickeal Skinner is managing that. She also questioned why the patient MRI was canceled and explained that it has not been authorized by insurance so it was probably canceled pending authorization to be rescheduled. Explained that this message will be sent to [facility] as well since the MRI was ordered by Dr. Mickeal Skinner. [name] was appreciative and had no other questions or concerns.

## 2018-06-13 ENCOUNTER — Other Ambulatory Visit: Payer: Self-pay | Admitting: *Deleted

## 2018-06-13 ENCOUNTER — Other Ambulatory Visit: Payer: Self-pay | Admitting: Oncology

## 2018-06-13 ENCOUNTER — Other Ambulatory Visit: Payer: Self-pay | Admitting: Internal Medicine

## 2018-06-13 DIAGNOSIS — C3490 Malignant neoplasm of unspecified part of unspecified bronchus or lung: Secondary | ICD-10-CM

## 2018-06-13 DIAGNOSIS — C7931 Secondary malignant neoplasm of brain: Secondary | ICD-10-CM

## 2018-06-13 DIAGNOSIS — M79605 Pain in left leg: Secondary | ICD-10-CM

## 2018-06-13 DIAGNOSIS — C763 Malignant neoplasm of pelvis: Secondary | ICD-10-CM

## 2018-06-13 DIAGNOSIS — M79604 Pain in right leg: Secondary | ICD-10-CM

## 2018-06-13 MED ORDER — LEVETIRACETAM 1000 MG PO TABS: 2000.0000 mg | ORAL_TABLET | Freq: Two times a day (BID) | ORAL | 2 refills | Status: DC

## 2018-06-13 MED ORDER — OXYCODONE HCL 15 MG PO TABS: 15.0000 mg | ORAL_TABLET | ORAL | 0 refills | Status: DC | PRN

## 2018-06-13 NOTE — Progress Notes (Signed)
[facility] notified in person of eRx error.

## 2018-06-19 ENCOUNTER — Inpatient Hospital Stay: Payer: Medicaid Other | Attending: Internal Medicine

## 2018-06-19 ENCOUNTER — Other Ambulatory Visit: Payer: Self-pay

## 2018-06-19 DIAGNOSIS — C3490 Malignant neoplasm of unspecified part of unspecified bronchus or lung: Secondary | ICD-10-CM | POA: Diagnosis not present

## 2018-06-19 DIAGNOSIS — R569 Unspecified convulsions: Secondary | ICD-10-CM | POA: Diagnosis not present

## 2018-06-19 DIAGNOSIS — C7951 Secondary malignant neoplasm of bone: Secondary | ICD-10-CM | POA: Diagnosis not present

## 2018-06-19 DIAGNOSIS — C7931 Secondary malignant neoplasm of brain: Secondary | ICD-10-CM | POA: Diagnosis present

## 2018-06-19 DIAGNOSIS — Z95828 Presence of other vascular implants and grafts: Secondary | ICD-10-CM

## 2018-06-19 DIAGNOSIS — Z79899 Other long term (current) drug therapy: Secondary | ICD-10-CM | POA: Diagnosis not present

## 2018-06-19 MED ORDER — SODIUM CHLORIDE 0.9% FLUSH
10.0000 mL | Freq: Once | INTRAVENOUS | Status: AC
  Administered 2018-06-19: 10 mL
  Filled 2018-06-19: qty 10

## 2018-06-19 MED ORDER — HEPARIN SOD (PORK) LOCK FLUSH 100 UNIT/ML IV SOLN
500.0000 [IU] | Freq: Once | INTRAVENOUS | Status: AC
  Administered 2018-06-19: 15:00:00 500 [IU]
  Filled 2018-06-19: qty 5

## 2018-06-24 ENCOUNTER — Other Ambulatory Visit: Payer: Self-pay | Admitting: Internal Medicine

## 2018-06-24 DIAGNOSIS — C7931 Secondary malignant neoplasm of brain: Secondary | ICD-10-CM

## 2018-06-26 ENCOUNTER — Ambulatory Visit (HOSPITAL_COMMUNITY): Admission: RE | Admit: 2018-06-26 | Payer: Medicaid Other | Source: Ambulatory Visit

## 2018-07-04 ENCOUNTER — Ambulatory Visit (HOSPITAL_COMMUNITY): Payer: Medicaid Other

## 2018-07-07 ENCOUNTER — Other Ambulatory Visit: Payer: Self-pay | Admitting: Oncology

## 2018-07-07 DIAGNOSIS — C3490 Malignant neoplasm of unspecified part of unspecified bronchus or lung: Secondary | ICD-10-CM

## 2018-07-07 DIAGNOSIS — M79605 Pain in left leg: Secondary | ICD-10-CM

## 2018-07-07 DIAGNOSIS — C763 Malignant neoplasm of pelvis: Secondary | ICD-10-CM

## 2018-07-07 DIAGNOSIS — M79604 Pain in right leg: Secondary | ICD-10-CM

## 2018-07-07 DIAGNOSIS — C7931 Secondary malignant neoplasm of brain: Secondary | ICD-10-CM

## 2018-07-07 MED ORDER — OXYCODONE HCL 15 MG PO TABS: 15.0000 mg | ORAL_TABLET | ORAL | 0 refills | Status: DC | PRN

## 2018-07-08 ENCOUNTER — Other Ambulatory Visit: Payer: Self-pay

## 2018-07-08 ENCOUNTER — Ambulatory Visit (HOSPITAL_COMMUNITY)
Admission: RE | Admit: 2018-07-08 | Discharge: 2018-07-08 | Disposition: A | Discharge status: Home / Self Care | Payer: Medicaid Other | Source: Ambulatory Visit | Attending: Internal Medicine | Admitting: Internal Medicine

## 2018-07-08 DIAGNOSIS — C7931 Secondary malignant neoplasm of brain: Secondary | ICD-10-CM | POA: Insufficient documentation

## 2018-07-08 MED ORDER — GADOBUTROL 1 MMOL/ML IV SOLN
7.0000 mL | Freq: Once | INTRAVENOUS | Status: AC | PRN
  Administered 2018-07-08: 7 mL via INTRAVENOUS

## 2018-07-10 ENCOUNTER — Other Ambulatory Visit: Payer: Self-pay

## 2018-07-10 ENCOUNTER — Telehealth: Payer: Self-pay | Admitting: Internal Medicine

## 2018-07-10 ENCOUNTER — Inpatient Hospital Stay (HOSPITAL_BASED_OUTPATIENT_CLINIC_OR_DEPARTMENT_OTHER): Payer: Medicaid Other | Admitting: Internal Medicine

## 2018-07-10 VITALS — BP 131/83 | HR 80 | Temp 97.9°F | Resp 18 | Ht 70.0 in | Wt 161.1 lb

## 2018-07-10 DIAGNOSIS — C7951 Secondary malignant neoplasm of bone: Secondary | ICD-10-CM | POA: Diagnosis not present

## 2018-07-10 DIAGNOSIS — Z79899 Other long term (current) drug therapy: Secondary | ICD-10-CM

## 2018-07-10 DIAGNOSIS — R569 Unspecified convulsions: Secondary | ICD-10-CM | POA: Diagnosis not present

## 2018-07-10 DIAGNOSIS — C3490 Malignant neoplasm of unspecified part of unspecified bronchus or lung: Secondary | ICD-10-CM | POA: Diagnosis not present

## 2018-07-10 DIAGNOSIS — C7931 Secondary malignant neoplasm of brain: Secondary | ICD-10-CM | POA: Diagnosis not present

## 2018-07-10 DIAGNOSIS — D492 Neoplasm of unspecified behavior of bone, soft tissue, and skin: Secondary | ICD-10-CM

## 2018-07-10 MED ORDER — AMITRIPTYLINE HCL 75 MG PO TABS: 75.0000 mg | ORAL_TABLET | Freq: Every day | ORAL | 3 refills | Status: DC

## 2018-07-10 MED ORDER — LEVETIRACETAM 1000 MG PO TABS: 2000.0000 mg | ORAL_TABLET | Freq: Two times a day (BID) | ORAL | 2 refills | Status: DC

## 2018-07-10 MED ORDER — DEXAMETHASONE 1 MG PO TABS: 2.0000 mg | ORAL_TABLET | Freq: Every day | ORAL | 1 refills | Status: DC

## 2018-07-10 NOTE — Telephone Encounter (Signed)
Scheduled appt per 4/30 los. ° °A calendar will be mailed out. °
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
## 2018-07-11 ENCOUNTER — Other Ambulatory Visit: Payer: Self-pay | Admitting: Radiation Therapy

## 2018-07-14 ENCOUNTER — Other Ambulatory Visit: Payer: Medicaid Other

## 2018-07-14 ENCOUNTER — Telehealth: Payer: Self-pay | Admitting: *Deleted

## 2018-07-14 ENCOUNTER — Other Ambulatory Visit: Payer: Self-pay | Admitting: Internal Medicine

## 2018-07-14 MED ORDER — DEXAMETHASONE 2 MG PO TABS: 4.0000 mg | ORAL_TABLET | Freq: Every day | ORAL | 0 refills | Status: DC

## 2018-07-14 NOTE — Telephone Encounter (Signed)
Patients wife called to advise that they got the MRI of the Lumbar time confused and had to reschedule.  She also stated that he is having severe pain since decreasing the Decadron from 4 mg to 2 mg and the Oxycodone 15mg  is not helping at all.  He takes it and reports no improvement 2 hours later.    Per Dr. Mickeal Skinner advised patient to increase Decadron back to 4 mg daily, get MRI of Lumbar spine for spinal sheath tumor evaluation and then determination for next steps will be made at Tumor Board conference.    Explained to wife.

## 2018-07-15 ENCOUNTER — Ambulatory Visit (HOSPITAL_COMMUNITY)
Admission: RE | Admit: 2018-07-15 | Discharge: 2018-07-15 | Disposition: A | Discharge status: Home / Self Care | Payer: Medicaid Other | Source: Ambulatory Visit | Attending: Oncology | Admitting: Oncology

## 2018-07-15 ENCOUNTER — Other Ambulatory Visit: Payer: Self-pay

## 2018-07-15 ENCOUNTER — Other Ambulatory Visit: Payer: Self-pay | Admitting: Oncology

## 2018-07-15 ENCOUNTER — Encounter (HOSPITAL_COMMUNITY): Payer: Self-pay

## 2018-07-15 DIAGNOSIS — C3490 Malignant neoplasm of unspecified part of unspecified bronchus or lung: Secondary | ICD-10-CM

## 2018-07-15 LAB — POCT I-STAT CREATININE: Creatinine, Ser: 0.9 mg/dL (ref 0.61–1.24)

## 2018-07-15 MED ORDER — IOHEXOL 300 MG/ML  SOLN
100.0000 mL | Freq: Once | INTRAMUSCULAR | Status: AC | PRN
  Administered 2018-07-15: 100 mL via INTRAVENOUS

## 2018-07-15 MED ORDER — SODIUM CHLORIDE (PF) 0.9 % IJ SOLN
INTRAMUSCULAR | Status: AC
  Filled 2018-07-15: qty 50

## 2018-07-17 ENCOUNTER — Telehealth: Payer: Self-pay | Admitting: *Deleted

## 2018-07-17 NOTE — Telephone Encounter (Signed)
Per Dr Alen Blew no changes in pain medication.  Patient notified.

## 2018-07-17 NOTE — Telephone Encounter (Signed)
Patient called today questioning if he could be given something in addition to the Oxycodone 15 mg for his back/leg pain.  He states that he is taking the Oxycodone 15 mg and occasionally has to take extra before next dose is available.  He was decreased on Decadron and the pain worsened but was instructed to go back to previous Decadron dose earlier in the week but the pain is persisting.    He has an upcoming Lumbar MRI to evaluate the nerve sheath tumor that was ordered by Dr. Mickeal Skinner.  Do you have any further pain management recommendations/order that the patient can be given?

## 2018-07-17 NOTE — Telephone Encounter (Signed)
None at this time. No changes in medication

## 2018-07-22 ENCOUNTER — Ambulatory Visit
Admission: RE | Admit: 2018-07-22 | Discharge: 2018-07-22 | Disposition: A | Discharge status: Home / Self Care | Payer: Medicaid Other | Source: Ambulatory Visit | Attending: Internal Medicine | Admitting: Internal Medicine

## 2018-07-22 ENCOUNTER — Telehealth: Payer: Self-pay

## 2018-07-22 ENCOUNTER — Other Ambulatory Visit: Payer: Self-pay

## 2018-07-22 DIAGNOSIS — D492 Neoplasm of unspecified behavior of bone, soft tissue, and skin: Secondary | ICD-10-CM

## 2018-07-22 MED ORDER — GADOBENATE DIMEGLUMINE 529 MG/ML IV SOLN
15.0000 mL | Freq: Once | INTRAVENOUS | Status: AC | PRN
  Administered 2018-07-22: 15 mL via INTRAVENOUS

## 2018-07-22 NOTE — Telephone Encounter (Signed)
Received several messages from patient spouse requesting a refill on Oxycodone. Left return message that the earliest the medication can be refilled is 5/18 per [place] and to call back with any questions or concerns.

## 2018-07-23 ENCOUNTER — Telehealth: Payer: Self-pay

## 2018-07-23 NOTE — Telephone Encounter (Signed)
Received several messages from patient spouse requesting refill of the Oxycodone and requesting a return call. Returned call and spoke with [employer]. [name] was agitated and demanding the refill. This RN explained that the Oxycodone should not run out until 5/18 and that this was confirmed by [place] on 5/12. She stated that the patient has been taking extra doses due to increased pain. Explained that per 5/7 note Dr. Alen Blew was not changing the pain management medication and therefor the prescription cannot be filled until 5/18. Explained that since the patient has an appointment with Dr. Mickeal Skinner on 5/19 for MRI results review that the patient can perhaps discuss pain management regimen at that time otherwise the current prescription regimen will be followed. [name] verbalized understanding and had no other questions or concerns. Dr. Alen Blew made aware.

## 2018-07-25 ENCOUNTER — Inpatient Hospital Stay: Payer: Medicaid Other | Attending: Internal Medicine | Admitting: Internal Medicine

## 2018-07-25 ENCOUNTER — Other Ambulatory Visit: Payer: Self-pay

## 2018-07-25 ENCOUNTER — Other Ambulatory Visit: Payer: Self-pay | Admitting: Oncology

## 2018-07-25 VITALS — BP 139/81 | HR 89 | Temp 98.9°F | Resp 18 | Ht 70.0 in | Wt 160.5 lb

## 2018-07-25 DIAGNOSIS — D497 Neoplasm of unspecified behavior of endocrine glands and other parts of nervous system: Secondary | ICD-10-CM | POA: Insufficient documentation

## 2018-07-25 DIAGNOSIS — C7931 Secondary malignant neoplasm of brain: Secondary | ICD-10-CM

## 2018-07-25 DIAGNOSIS — M79604 Pain in right leg: Secondary | ICD-10-CM

## 2018-07-25 DIAGNOSIS — C801 Malignant (primary) neoplasm, unspecified: Secondary | ICD-10-CM | POA: Insufficient documentation

## 2018-07-25 DIAGNOSIS — D492 Neoplasm of unspecified behavior of bone, soft tissue, and skin: Secondary | ICD-10-CM

## 2018-07-25 DIAGNOSIS — C3490 Malignant neoplasm of unspecified part of unspecified bronchus or lung: Secondary | ICD-10-CM

## 2018-07-25 DIAGNOSIS — C763 Malignant neoplasm of pelvis: Secondary | ICD-10-CM

## 2018-07-25 MED ORDER — OXYCODONE HCL 15 MG PO TABS: 15.0000 mg | ORAL_TABLET | ORAL | 0 refills | Status: DC | PRN
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
## 2018-07-28 ENCOUNTER — Telehealth: Payer: Self-pay | Admitting: Internal Medicine

## 2018-07-28 ENCOUNTER — Other Ambulatory Visit: Payer: Self-pay | Admitting: Radiation Therapy

## 2018-07-28 DIAGNOSIS — C7951 Secondary malignant neoplasm of bone: Secondary | ICD-10-CM

## 2018-07-28 NOTE — Telephone Encounter (Signed)
No los per 5/15.

## 2018-07-29 ENCOUNTER — Ambulatory Visit: Payer: Medicaid Other | Admitting: Internal Medicine

## 2018-07-30 ENCOUNTER — Other Ambulatory Visit: Payer: Self-pay

## 2018-07-30 ENCOUNTER — Ambulatory Visit
Admission: RE | Admit: 2018-07-30 | Discharge: 2018-07-30 | Disposition: A | Discharge status: Home / Self Care | Payer: Medicaid Other | Source: Ambulatory Visit | Attending: Radiation Oncology | Admitting: Radiation Oncology

## 2018-07-30 ENCOUNTER — Other Ambulatory Visit: Payer: Medicaid Other

## 2018-07-30 ENCOUNTER — Encounter: Payer: Self-pay | Admitting: Radiation Oncology

## 2018-07-30 ENCOUNTER — Ambulatory Visit: Payer: Medicaid Other | Admitting: Oncology

## 2018-07-30 VITALS — Ht 70.0 in | Wt 160.0 lb

## 2018-07-30 DIAGNOSIS — C479 Malignant neoplasm of peripheral nerves and autonomic nervous system, unspecified: Secondary | ICD-10-CM | POA: Insufficient documentation

## 2018-07-30 DIAGNOSIS — C7931 Secondary malignant neoplasm of brain: Secondary | ICD-10-CM
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
## 2018-07-30 NOTE — Progress Notes (Signed)
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 Palliative radiation  Sites of Visceral and Bony Metastatic Disease: L5 neuroforaminal mass.  Mass has decreased in size since chemotherapy started.   Past/Anticipated chemotherapy by medical oncology, if any:  Dr. Mickeal Skinner 07/25/2018 -Because of recurrent symptoms, we will discuss his case in brain/spine tumor board Monday.  In particular, will discuss candidacy for radiotherapy as palliative measure.  In addition, could consider re-referral to Dr. Maryjean Ka for local intervention.  Previously he had poor response from local corticosteroid injections.    Pain on a scale of 0-10 is: Lower back, 7/10, taking Oxycodone 15 mg.   If Spine Met(s), symptoms, if any, include:  Bowel/Bladder retention or incontinence (please describe): No  Numbness or weakness in extremities (please describe): Left leg numbness/tingling.  Last night the tingling and spasms were in the whole left leg.  Current Decadron regimen, if applicable: 60m daily  Ambulatory status? Walker? Wheelchair?: Ambulatory  SAFETY ISSUES:  [place], 08/16/2016  Pacemaker/ICD? No  Possible current pregnancy? N/A   Is the patient on methotrexate? No  Current Complaints / other details:

## 2018-08-01 ENCOUNTER — Other Ambulatory Visit: Payer: Self-pay

## 2018-08-01 ENCOUNTER — Ambulatory Visit
Admission: RE | Admit: 2018-08-01 | Discharge: 2018-08-01 | Disposition: A | Discharge status: Home / Self Care | Payer: Medicaid Other | Source: Ambulatory Visit | Attending: Radiation Oncology | Admitting: Radiation Oncology

## 2018-08-01 ENCOUNTER — Ambulatory Visit: Payer: Medicaid Other | Admitting: Radiation Oncology

## 2018-08-01 DIAGNOSIS — C7951 Secondary malignant neoplasm of bone: Secondary | ICD-10-CM

## 2018-08-05 ENCOUNTER — Ambulatory Visit
Admission: RE | Admit: 2018-08-05 | Discharge: 2018-08-05 | Disposition: A | Discharge status: Home / Self Care | Payer: Medicaid Other | Source: Ambulatory Visit | Attending: Radiation Oncology | Admitting: Radiation Oncology

## 2018-08-05 ENCOUNTER — Other Ambulatory Visit: Payer: Self-pay

## 2018-08-05 DIAGNOSIS — C7951 Secondary malignant neoplasm of bone: Secondary | ICD-10-CM | POA: Diagnosis present

## 2018-08-05 DIAGNOSIS — C801 Malignant (primary) neoplasm, unspecified: Secondary | ICD-10-CM | POA: Insufficient documentation

## 2018-08-05 DIAGNOSIS — C479 Malignant neoplasm of peripheral nerves and autonomic nervous system, unspecified: Secondary | ICD-10-CM

## 2018-08-05 DIAGNOSIS — Z510 Encounter for antineoplastic radiation therapy: Secondary | ICD-10-CM | POA: Insufficient documentation

## 2018-08-06 ENCOUNTER — Other Ambulatory Visit: Payer: Self-pay | Admitting: Oncology

## 2018-08-06 ENCOUNTER — Ambulatory Visit
Admission: RE | Admit: 2018-08-06 | Discharge: 2018-08-06 | Disposition: A | Discharge status: Home / Self Care | Payer: Medicaid Other | Source: Ambulatory Visit | Attending: Radiation Oncology | Admitting: Radiation Oncology

## 2018-08-06 DIAGNOSIS — C7951 Secondary malignant neoplasm of bone: Secondary | ICD-10-CM

## 2018-08-06 MED ORDER — GADOBENATE DIMEGLUMINE 529 MG/ML IV SOLN
15.0000 mL | Freq: Once | INTRAVENOUS | Status: AC | PRN
  Administered 2018-08-06: 15 mL via INTRAVENOUS

## 2018-08-07 DIAGNOSIS — Z510 Encounter for antineoplastic radiation therapy: Secondary | ICD-10-CM | POA: Diagnosis not present

## 2018-08-08 ENCOUNTER — Other Ambulatory Visit: Payer: Self-pay

## 2018-08-08 ENCOUNTER — Ambulatory Visit
Admission: RE | Admit: 2018-08-08 | Discharge: 2018-08-08 | Disposition: A | Discharge status: Home / Self Care | Payer: Medicaid Other | Source: Ambulatory Visit | Attending: Radiation Oncology | Admitting: Radiation Oncology

## 2018-08-08 ENCOUNTER — Ambulatory Visit: Payer: Medicaid Other | Admitting: Radiation Oncology

## 2018-08-08 VITALS — BP 134/90 | HR 78 | Temp 98.3°F | Resp 18

## 2018-08-08 DIAGNOSIS — Z510 Encounter for antineoplastic radiation therapy: Secondary | ICD-10-CM | POA: Diagnosis not present

## 2018-08-08 DIAGNOSIS — C479 Malignant neoplasm of peripheral nerves and autonomic nervous system, unspecified: Secondary | ICD-10-CM

## 2018-08-08 DIAGNOSIS — C7951 Secondary malignant neoplasm of bone: Secondary | ICD-10-CM

## 2018-08-08 NOTE — Progress Notes (Signed)
   Name: Eric Mason  MRN: [id number]  Date: 08/08/2018   DOB: Oct 16, 1970  Stereotactic Radiosurgery Operative Note  PRE-OPERATIVE DIAGNOSIS:  Spinal Metastasis  POST-OPERATIVE DIAGNOSIS:  Spinal Metastasis  PROCEDURE:  Stereotactic Radiosurgery  SURGEON:  Anelis Hrivnak P, MD  NARRATIVE: The patient underwent a radiation treatment planning session in the radiation oncology simulation suite under the care of the radiation oncology physician and physicist.  I participated closely in the radiation treatment planning afterwards. The patient underwent planning CT myelogram which was fused to the MRI.  These images were fused on the planning system.  Radiation oncology contoured the gross target volume and subsequently expanded this to yield the Planning Target Volume. I actively participated in the planning process.  I helped to define and review the target contours and also the contours of the spinal cord, and selected nearby organs at risk.  All the dose constraints for critical structures were reviewed and compared to AAPM Task Group 101.  The prescription dose conformity was reviewed.  I approved the plan electronically.    Accordingly, [name] was brought to the TrueBeam stereotactic radiation treatment linac and placed in the custom immobilization device.  The patient was aligned according to the IR fiducial markers with BrainLab Exactrac, then orthogonal x-rays were used in ExacTrac with the 6DOF robotic table and the shifts were made to align the patient.  Then conebeam CT was performed to verify precision.  [name] received stereotactic radiosurgery uneventfully.  The detailed description of the procedure is recorded in the radiation oncology procedure note.  I was present for the duration of the procedure.  DISPOSITION:  Following delivery, the patient was transported to nursing in stable condition and monitored for possible acute effects to be discharged to home in  stable condition with follow-up in one month.  Pope Brunty P, MD 08/08/2018 1:21 PM

## 2018-08-08 NOTE — Progress Notes (Signed)
Nurse monitoring complete following SRS treatment of spine. Vitals stable. Patient reports lumbar spine pain     On a scale of 0-10. Patient endorses taking decadron daily. No evidence of thrush noted. Encouraged patient to not skip any doses of his decadron and to continue taking them as directed. Patient denies onset of any new neurologic symptoms. Instructed patient to avoid strenuous activity for the next 24 hours. Instructed patient to phone [phone] if needs develop over the weekend related to today's treatment. Patient verbalized understanding. Patient discharged home. Patient exited nursing clinic ambulatory and in no distress.

## 2018-08-12 ENCOUNTER — Other Ambulatory Visit: Payer: Self-pay | Admitting: Oncology

## 2018-08-12 DIAGNOSIS — M79604 Pain in right leg: Secondary | ICD-10-CM

## 2018-08-12 DIAGNOSIS — C3490 Malignant neoplasm of unspecified part of unspecified bronchus or lung: Secondary | ICD-10-CM

## 2018-08-12 DIAGNOSIS — C763 Malignant neoplasm of pelvis: Secondary | ICD-10-CM

## 2018-08-12 DIAGNOSIS — C7931 Secondary malignant neoplasm of brain: Secondary | ICD-10-CM

## 2018-08-12 MED ORDER — OXYCODONE HCL 15 MG PO TABS: 15.0000 mg | ORAL_TABLET | ORAL | 0 refills | Status: DC | PRN

## 2018-08-29 ENCOUNTER — Other Ambulatory Visit: Payer: Self-pay | Admitting: Oncology

## 2018-08-29 ENCOUNTER — Telehealth: Payer: Self-pay | Admitting: Radiation Therapy

## 2018-08-29 DIAGNOSIS — M79605 Pain in left leg: Secondary | ICD-10-CM

## 2018-08-29 DIAGNOSIS — M79604 Pain in right leg: Secondary | ICD-10-CM

## 2018-08-29 DIAGNOSIS — C7931 Secondary malignant neoplasm of brain: Secondary | ICD-10-CM

## 2018-08-29 DIAGNOSIS — C763 Malignant neoplasm of pelvis: Secondary | ICD-10-CM

## 2018-08-29 DIAGNOSIS — C3490 Malignant neoplasm of unspecified part of unspecified bronchus or lung: Secondary | ICD-10-CM

## 2018-08-29 MED ORDER — OXYCODONE HCL 15 MG PO TABS: 15.0000 mg | ORAL_TABLET | ORAL | 0 refills | Status: DC | PRN

## 2018-08-29 NOTE — Telephone Encounter (Signed)
[name] called to verify Eric Mason's upcoming schedule and asked for clarification of the steroid taper instructions given by Dr. Mickeal Skinner during his last visit. These instructions were not included in his last note, so I reached out to Dr. Mickeal Skinner and then called [name] back with his reply. She did not answer the phone so I left the below instructions on her cell's vm.   3mg  daily x1 week, then 2mg  x1 week, then 1mg  x1 week, then stop.   This taper should have started soon after the 5/15 follow-up visit and should be completed. I asked for a return call when they get my message.    Mont Dutton R.T.(R)(T) Special Procedures Navigator

## 2018-09-09 ENCOUNTER — Other Ambulatory Visit: Payer: Self-pay

## 2018-09-09 ENCOUNTER — Ambulatory Visit
Admission: RE | Admit: 2018-09-09 | Discharge: 2018-09-09 | Disposition: A | Discharge status: Home / Self Care | Payer: Medicaid Other | Source: Ambulatory Visit | Attending: Radiation Oncology | Admitting: Radiation Oncology

## 2018-09-09 DIAGNOSIS — C3490 Malignant neoplasm of unspecified part of unspecified bronchus or lung: Secondary | ICD-10-CM

## 2018-09-09 DIAGNOSIS — C7931 Secondary malignant neoplasm of brain: Secondary | ICD-10-CM

## 2018-09-09 DIAGNOSIS — D492 Neoplasm of unspecified behavior of bone, soft tissue, and skin: Secondary | ICD-10-CM

## 2018-09-09 DIAGNOSIS — C479 Malignant neoplasm of peripheral nerves and autonomic nervous system, unspecified: Secondary | ICD-10-CM

## 2018-09-09 MED ORDER — DEXAMETHASONE 4 MG PO TABS: 4.0000 mg | ORAL_TABLET | Freq: Two times a day (BID) | ORAL | 0 refills | Status: DC

## 2018-09-09 MED ORDER — FLUCONAZOLE 150 MG PO TABS: 150.0000 mg | ORAL_TABLET | Freq: Every day | ORAL | 1 refills | Status: AC
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
## 2018-09-15 ENCOUNTER — Other Ambulatory Visit: Payer: Self-pay | Admitting: Internal Medicine

## 2018-09-15 DIAGNOSIS — C763 Malignant neoplasm of pelvis: Secondary | ICD-10-CM

## 2018-09-15 DIAGNOSIS — M79604 Pain in right leg: Secondary | ICD-10-CM

## 2018-09-15 DIAGNOSIS — C3490 Malignant neoplasm of unspecified part of unspecified bronchus or lung: Secondary | ICD-10-CM

## 2018-09-15 DIAGNOSIS — C7931 Secondary malignant neoplasm of brain: Secondary | ICD-10-CM

## 2018-09-15 MED ORDER — OXYCODONE HCL 15 MG PO TABS: 15.0000 mg | ORAL_TABLET | ORAL | 0 refills | Status: DC | PRN

## 2018-09-23 ENCOUNTER — Telehealth: Payer: Self-pay | Admitting: Radiation Therapy

## 2018-09-23 NOTE — Telephone Encounter (Signed)
Eric Mason's wife, [name], called very concerned about her husband. She states that he has stopped drinking, last drink 11 days ago. (average was normally three 24oz beers per day) For the past 5 days he has been very sick and has not eaten or had very little fluid intake. He has had nausea, dry heaving and diarrhea more than 4 times today. He is sweating without a temp, last taken was 97.1. [name] has not given him imodium stating that she wanted to talk with someone first and they do not have anything for his nausea. She is frantic and wanting someone to call her back with recommendations for him.   Her # is [phone]    This message was sent to his treatment team here at the cancer center.    Mont Dutton R.T.(R)(T) Special Procedures Navigator

## 2018-09-24 ENCOUNTER — Inpatient Hospital Stay (HOSPITAL_COMMUNITY)
Admission: EM | Admit: 2018-09-24 | Discharge: 2018-09-27 | DRG: 392 | Disposition: A | Discharge status: Home / Self Care | Payer: Medicaid Other | Attending: Internal Medicine | Admitting: Internal Medicine

## 2018-09-24 ENCOUNTER — Other Ambulatory Visit: Payer: Self-pay

## 2018-09-24 ENCOUNTER — Encounter (HOSPITAL_COMMUNITY): Payer: Self-pay

## 2018-09-24 DIAGNOSIS — Z800 Family history of malignant neoplasm of digestive organs: Secondary | ICD-10-CM

## 2018-09-24 DIAGNOSIS — C7931 Secondary malignant neoplasm of brain: Secondary | ICD-10-CM | POA: Diagnosis present

## 2018-09-24 DIAGNOSIS — R112 Nausea with vomiting, unspecified: Principal | ICD-10-CM | POA: Diagnosis present

## 2018-09-24 DIAGNOSIS — K210 Gastro-esophageal reflux disease with esophagitis: Secondary | ICD-10-CM

## 2018-09-24 DIAGNOSIS — Z79899 Other long term (current) drug therapy: Secondary | ICD-10-CM

## 2018-09-24 DIAGNOSIS — M25552 Pain in left hip: Secondary | ICD-10-CM | POA: Diagnosis present

## 2018-09-24 DIAGNOSIS — E876 Hypokalemia: Secondary | ICD-10-CM

## 2018-09-24 DIAGNOSIS — Z20828 Contact with and (suspected) exposure to other viral communicable diseases: Secondary | ICD-10-CM | POA: Diagnosis present

## 2018-09-24 DIAGNOSIS — R030 Elevated blood-pressure reading, without diagnosis of hypertension: Secondary | ICD-10-CM | POA: Diagnosis present

## 2018-09-24 DIAGNOSIS — Z808 Family history of malignant neoplasm of other organs or systems: Secondary | ICD-10-CM

## 2018-09-24 DIAGNOSIS — K5900 Constipation, unspecified: Secondary | ICD-10-CM

## 2018-09-24 DIAGNOSIS — Z87898 Personal history of other specified conditions: Secondary | ICD-10-CM

## 2018-09-24 DIAGNOSIS — M549 Dorsalgia, unspecified: Secondary | ICD-10-CM | POA: Diagnosis present

## 2018-09-24 DIAGNOSIS — R1084 Generalized abdominal pain: Secondary | ICD-10-CM | POA: Diagnosis present

## 2018-09-24 DIAGNOSIS — Z91040 Latex allergy status: Secondary | ICD-10-CM

## 2018-09-24 DIAGNOSIS — Z85118 Personal history of other malignant neoplasm of bronchus and lung: Secondary | ICD-10-CM

## 2018-09-24 DIAGNOSIS — Z8051 Family history of malignant neoplasm of kidney: Secondary | ICD-10-CM

## 2018-09-24 DIAGNOSIS — F419 Anxiety disorder, unspecified: Secondary | ICD-10-CM | POA: Diagnosis present

## 2018-09-24 DIAGNOSIS — C7951 Secondary malignant neoplasm of bone: Secondary | ICD-10-CM | POA: Diagnosis present

## 2018-09-24 DIAGNOSIS — Z9221 Personal history of antineoplastic chemotherapy: Secondary | ICD-10-CM

## 2018-09-24 DIAGNOSIS — K219 Gastro-esophageal reflux disease without esophagitis: Secondary | ICD-10-CM | POA: Diagnosis present

## 2018-09-24 DIAGNOSIS — R569 Unspecified convulsions: Secondary | ICD-10-CM | POA: Diagnosis present

## 2018-09-24 DIAGNOSIS — F17210 Nicotine dependence, cigarettes, uncomplicated: Secondary | ICD-10-CM | POA: Diagnosis present

## 2018-09-24 DIAGNOSIS — Y842 Radiological procedure and radiotherapy as the cause of abnormal reaction of the patient, or of later complication, without mention of misadventure at the time of the procedure: Secondary | ICD-10-CM | POA: Diagnosis present

## 2018-09-24 DIAGNOSIS — R531 Weakness: Secondary | ICD-10-CM

## 2018-09-24 DIAGNOSIS — G893 Neoplasm related pain (acute) (chronic): Secondary | ICD-10-CM | POA: Diagnosis present

## 2018-09-24 DIAGNOSIS — Z803 Family history of malignant neoplasm of breast: Secondary | ICD-10-CM

## 2018-09-24 LAB — URINALYSIS, ROUTINE W REFLEX MICROSCOPIC
Bilirubin Urine: NEGATIVE
Glucose, UA: NEGATIVE mg/dL
Ketones, ur: 20 mg/dL — AB
Leukocytes,Ua: NEGATIVE
Nitrite: NEGATIVE
Protein, ur: NEGATIVE mg/dL
Specific Gravity, Urine: 1.009 (ref 1.005–1.030)
pH: 8 (ref 5.0–8.0)

## 2018-09-24 LAB — CBC WITH DIFFERENTIAL/PLATELET
Abs Immature Granulocytes: 0.03 10*3/uL (ref 0.00–0.07)
Basophils Absolute: 0 10*3/uL (ref 0.0–0.1)
Basophils Relative: 0 %
Eosinophils Absolute: 0 10*3/uL (ref 0.0–0.5)
Eosinophils Relative: 0 %
HCT: 39.5 % (ref 39.0–52.0)
Hemoglobin: 12.8 g/dL — ABNORMAL LOW (ref 13.0–17.0)
Immature Granulocytes: 0 %
Lymphocytes Relative: 32 %
Lymphs Abs: 3.3 10*3/uL (ref 0.7–4.0)
MCH: 26.8 pg (ref 26.0–34.0)
MCHC: 32.4 g/dL (ref 30.0–36.0)
MCV: 82.8 fL (ref 80.0–100.0)
Monocytes Absolute: 1 10*3/uL (ref 0.1–1.0)
Monocytes Relative: 9 %
Neutro Abs: 6.1 10*3/uL (ref 1.7–7.7)
Neutrophils Relative %: 59 %
Platelets: 447 10*3/uL — ABNORMAL HIGH (ref 150–400)
RBC: 4.77 MIL/uL (ref 4.22–5.81)
RDW: 16.1 % — ABNORMAL HIGH (ref 11.5–15.5)
WBC: 10.4 10*3/uL (ref 4.0–10.5)
nRBC: 0 % (ref 0.0–0.2)

## 2018-09-24 LAB — COMPREHENSIVE METABOLIC PANEL
ALT: 12 U/L (ref 0–44)
AST: 19 U/L (ref 15–41)
Albumin: 3.9 g/dL (ref 3.5–5.0)
Alkaline Phosphatase: 79 U/L (ref 38–126)
Anion gap: 15 (ref 5–15)
BUN: 8 mg/dL (ref 6–20)
CO2: 24 mmol/L (ref 22–32)
Calcium: 9.3 mg/dL (ref 8.9–10.3)
Chloride: 97 mmol/L — ABNORMAL LOW (ref 98–111)
Creatinine, Ser: 0.71 mg/dL (ref 0.61–1.24)
GFR calc Af Amer: >60 mL/min (ref >60)
GFR calc non Af Amer: >60 mL/min (ref >60)
Glucose, Bld: 122 mg/dL — ABNORMAL HIGH (ref 70–99)
Potassium: 2.9 mmol/L — ABNORMAL LOW (ref 3.5–5.1)
Sodium: 136 mmol/L (ref 135–145)
Total Bilirubin: 0.5 mg/dL (ref 0.3–1.2)
Total Protein: 8.9 g/dL — ABNORMAL HIGH (ref 6.5–8.1)

## 2018-09-24 LAB — RAPID URINE DRUG SCREEN, HOSP PERFORMED
Amphetamines: NOT DETECTED
Barbiturates: NOT DETECTED
Benzodiazepines: NOT DETECTED
Cocaine: NOT DETECTED
Opiates: POSITIVE — AB
Tetrahydrocannabinol: POSITIVE — AB

## 2018-09-24 LAB — LIPASE, BLOOD
Lipase: 25 U/L (ref 11–51)
Lipase: 25 U/L (ref 11–51)

## 2018-09-24 LAB — MAGNESIUM: Magnesium: 1.6 mg/dL — ABNORMAL LOW (ref 1.7–2.4)

## 2018-09-24 LAB — SARS CORONAVIRUS 2 BY RT PCR (HOSPITAL ORDER, PERFORMED IN ~~LOC~~ HOSPITAL LAB): SARS Coronavirus 2: NEGATIVE

## 2018-09-24 MED ORDER — OXYCODONE HCL 5 MG PO TABS
5.0000 mg | ORAL_TABLET | ORAL | Status: DC | PRN
  Administered 2018-09-24 – 2018-09-25 (×2): 5 mg via ORAL
  Filled 2018-09-24 (×2): qty 1

## 2018-09-24 MED ORDER — SODIUM CHLORIDE 0.9 % IV BOLUS
1000.0000 mL | Freq: Once | INTRAVENOUS | Status: AC
  Administered 2018-09-24: 11:00:00 1000 mL via INTRAVENOUS

## 2018-09-24 MED ORDER — HYDRALAZINE HCL 20 MG/ML IJ SOLN: 10.0000 mg | INTRAMUSCULAR | Status: DC | PRN

## 2018-09-24 MED ORDER — LACTATED RINGERS IV SOLN
INTRAVENOUS | Status: AC
  Administered 2018-09-24 – 2018-09-25 (×2): via INTRAVENOUS

## 2018-09-24 MED ORDER — MAGNESIUM SULFATE 2 GM/50ML IV SOLN
2.0000 g | Freq: Once | INTRAVENOUS | Status: AC
  Administered 2018-09-24: 19:00:00 2 g via INTRAVENOUS
  Filled 2018-09-24: qty 50

## 2018-09-24 MED ORDER — LORAZEPAM 1 MG PO TABS: 1.0000 mg | ORAL_TABLET | Freq: Three times a day (TID) | ORAL | Status: DC | PRN

## 2018-09-24 MED ORDER — AMITRIPTYLINE HCL 25 MG PO TABS
75.0000 mg | ORAL_TABLET | Freq: Every day | ORAL | Status: DC
  Administered 2018-09-24 – 2018-09-26 (×3): 75 mg via ORAL
  Filled 2018-09-24 (×3): qty 3

## 2018-09-24 MED ORDER — NICOTINE 14 MG/24HR TD PT24
14.0000 mg | MEDICATED_PATCH | Freq: Every day | TRANSDERMAL | Status: DC
  Administered 2018-09-24 – 2018-09-26 (×3): 14 mg via TRANSDERMAL
  Filled 2018-09-24 (×5): qty 1

## 2018-09-24 MED ORDER — DEXAMETHASONE 4 MG PO TABS
4.0000 mg | ORAL_TABLET | Freq: Two times a day (BID) | ORAL | Status: DC
  Administered 2018-09-24 – 2018-09-27 (×6): 4 mg via ORAL
  Filled 2018-09-24 (×6): qty 1

## 2018-09-24 MED ORDER — ONDANSETRON HCL 4 MG PO TABS: 4.0000 mg | ORAL_TABLET | Freq: Four times a day (QID) | ORAL | Status: DC | PRN

## 2018-09-24 MED ORDER — SENNA 8.6 MG PO TABS
1.0000 | ORAL_TABLET | Freq: Two times a day (BID) | ORAL | Status: DC
  Administered 2018-09-24 – 2018-09-26 (×4): 8.6 mg via ORAL
  Filled 2018-09-24 (×4): qty 1

## 2018-09-24 MED ORDER — ONDANSETRON HCL 4 MG/2ML IJ SOLN
4.0000 mg | Freq: Four times a day (QID) | INTRAMUSCULAR | Status: DC | PRN
  Administered 2018-09-24: 18:00:00 4 mg via INTRAVENOUS
  Filled 2018-09-24: qty 2

## 2018-09-24 MED ORDER — GABAPENTIN 300 MG PO CAPS
600.0000 mg | ORAL_CAPSULE | Freq: Three times a day (TID) | ORAL | Status: DC
  Administered 2018-09-24 – 2018-09-27 (×9): 600 mg via ORAL
  Filled 2018-09-24 (×9): qty 2

## 2018-09-24 MED ORDER — HYDROMORPHONE HCL 1 MG/ML IJ SOLN
0.5000 mg | INTRAMUSCULAR | Status: DC | PRN
  Administered 2018-09-24 – 2018-09-27 (×14): 0.5 mg via INTRAVENOUS
  Filled 2018-09-24 (×14): qty 0.5

## 2018-09-24 MED ORDER — POTASSIUM CHLORIDE CRYS ER 20 MEQ PO TBCR
40.0000 meq | EXTENDED_RELEASE_TABLET | Freq: Once | ORAL | Status: AC
  Administered 2018-09-24: 22:00:00 40 meq via ORAL
  Filled 2018-09-24: qty 2

## 2018-09-24 MED ORDER — POLYETHYLENE GLYCOL 3350 17 G PO PACK: 17.0000 g | PACK | Freq: Every day | ORAL | Status: DC | PRN

## 2018-09-24 MED ORDER — ACETAMINOPHEN 650 MG RE SUPP: 650.0000 mg | Freq: Four times a day (QID) | RECTAL | Status: DC | PRN

## 2018-09-24 MED ORDER — TRAZODONE HCL 50 MG PO TABS
25.0000 mg | ORAL_TABLET | Freq: Every evening | ORAL | Status: DC | PRN
  Administered 2018-09-24 – 2018-09-27 (×2): 25 mg via ORAL
  Filled 2018-09-24 (×2): qty 1

## 2018-09-24 MED ORDER — POTASSIUM CHLORIDE CRYS ER 20 MEQ PO TBCR
40.0000 meq | EXTENDED_RELEASE_TABLET | Freq: Once | ORAL | Status: AC
  Administered 2018-09-24: 13:00:00 40 meq via ORAL
  Filled 2018-09-24: qty 2

## 2018-09-24 MED ORDER — PANTOPRAZOLE SODIUM 40 MG PO TBEC
40.0000 mg | DELAYED_RELEASE_TABLET | Freq: Two times a day (BID) | ORAL | Status: DC
  Administered 2018-09-24 – 2018-09-27 (×6): 40 mg via ORAL
  Filled 2018-09-24 (×6): qty 1

## 2018-09-24 MED ORDER — ONDANSETRON HCL 4 MG/2ML IJ SOLN
4.0000 mg | Freq: Once | INTRAMUSCULAR | Status: AC
  Administered 2018-09-24: 4 mg via INTRAVENOUS
  Filled 2018-09-24: qty 2

## 2018-09-24 MED ORDER — LEVETIRACETAM 500 MG PO TABS
2000.0000 mg | ORAL_TABLET | Freq: Two times a day (BID) | ORAL | Status: DC
  Administered 2018-09-24 – 2018-09-27 (×6): 2000 mg via ORAL
  Filled 2018-09-24 (×6): qty 4

## 2018-09-24 MED ORDER — ENOXAPARIN SODIUM 40 MG/0.4ML ~~LOC~~ SOLN
40.0000 mg | SUBCUTANEOUS | Status: DC
  Administered 2018-09-24 – 2018-09-26 (×3): 40 mg via SUBCUTANEOUS
  Filled 2018-09-24 (×3): qty 0.4

## 2018-09-24 MED ORDER — POTASSIUM CHLORIDE 10 MEQ/100ML IV SOLN
10.0000 meq | Freq: Once | INTRAVENOUS | Status: AC
  Administered 2018-09-24: 13:00:00 10 meq via INTRAVENOUS
  Filled 2018-09-24: qty 100

## 2018-09-24 MED ORDER — FOLIC ACID 1 MG PO TABS
1.0000 mg | ORAL_TABLET | Freq: Every day | ORAL | Status: DC
  Administered 2018-09-24 – 2018-09-27 (×4): 1 mg via ORAL
  Filled 2018-09-24 (×4): qty 1

## 2018-09-24 MED ORDER — ACETAMINOPHEN 325 MG PO TABS: 650.0000 mg | ORAL_TABLET | Freq: Four times a day (QID) | ORAL | Status: DC | PRN

## 2018-09-24 MED ORDER — HYDROMORPHONE HCL 1 MG/ML IJ SOLN
0.5000 mg | Freq: Once | INTRAMUSCULAR | Status: AC
  Administered 2018-09-24: 15:00:00 0.5 mg via INTRAVENOUS
  Filled 2018-09-24: qty 1

## 2018-09-24 MED ORDER — MORPHINE SULFATE (PF) 4 MG/ML IV SOLN
4.0000 mg | Freq: Once | INTRAVENOUS | Status: AC
  Administered 2018-09-24: 11:00:00 4 mg via INTRAVENOUS
  Filled 2018-09-24: qty 1

## 2018-09-24 MED ORDER — PROMETHAZINE HCL 25 MG/ML IJ SOLN
12.5000 mg | Freq: Once | INTRAMUSCULAR | Status: AC
  Administered 2018-09-24: 15:00:00 12.5 mg via INTRAVENOUS
  Filled 2018-09-24: qty 1
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
## 2018-09-24 NOTE — ED Triage Notes (Signed)
Pt BIBA from a parking lot, c/o emesis x3 days, with hematemesis starting at 0600 today.  Also c/o abdominal pain. Also c/o generalized weakness.   Hx of metastatic cancer.    AOx4.
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
## 2018-09-25 ENCOUNTER — Observation Stay (HOSPITAL_COMMUNITY): Payer: Medicaid Other

## 2018-09-25 DIAGNOSIS — Z8051 Family history of malignant neoplasm of kidney: Secondary | ICD-10-CM | POA: Diagnosis not present

## 2018-09-25 DIAGNOSIS — Z79899 Other long term (current) drug therapy: Secondary | ICD-10-CM | POA: Diagnosis not present

## 2018-09-25 DIAGNOSIS — Z20828 Contact with and (suspected) exposure to other viral communicable diseases: Secondary | ICD-10-CM | POA: Diagnosis present

## 2018-09-25 DIAGNOSIS — Z85118 Personal history of other malignant neoplasm of bronchus and lung: Secondary | ICD-10-CM | POA: Diagnosis not present

## 2018-09-25 DIAGNOSIS — Z808 Family history of malignant neoplasm of other organs or systems: Secondary | ICD-10-CM | POA: Diagnosis not present

## 2018-09-25 DIAGNOSIS — M549 Dorsalgia, unspecified: Secondary | ICD-10-CM | POA: Diagnosis present

## 2018-09-25 DIAGNOSIS — Y842 Radiological procedure and radiotherapy as the cause of abnormal reaction of the patient, or of later complication, without mention of misadventure at the time of the procedure: Secondary | ICD-10-CM | POA: Diagnosis present

## 2018-09-25 DIAGNOSIS — R1084 Generalized abdominal pain: Secondary | ICD-10-CM | POA: Diagnosis present

## 2018-09-25 DIAGNOSIS — Z803 Family history of malignant neoplasm of breast: Secondary | ICD-10-CM | POA: Diagnosis not present

## 2018-09-25 DIAGNOSIS — K5900 Constipation, unspecified: Secondary | ICD-10-CM

## 2018-09-25 DIAGNOSIS — R030 Elevated blood-pressure reading, without diagnosis of hypertension: Secondary | ICD-10-CM | POA: Diagnosis present

## 2018-09-25 DIAGNOSIS — F17210 Nicotine dependence, cigarettes, uncomplicated: Secondary | ICD-10-CM | POA: Diagnosis present

## 2018-09-25 DIAGNOSIS — F419 Anxiety disorder, unspecified: Secondary | ICD-10-CM | POA: Diagnosis present

## 2018-09-25 DIAGNOSIS — R569 Unspecified convulsions: Secondary | ICD-10-CM | POA: Diagnosis present

## 2018-09-25 DIAGNOSIS — G893 Neoplasm related pain (acute) (chronic): Secondary | ICD-10-CM | POA: Diagnosis present

## 2018-09-25 DIAGNOSIS — Z91040 Latex allergy status: Secondary | ICD-10-CM | POA: Diagnosis not present

## 2018-09-25 DIAGNOSIS — C7951 Secondary malignant neoplasm of bone: Secondary | ICD-10-CM | POA: Diagnosis present

## 2018-09-25 DIAGNOSIS — R112 Nausea with vomiting, unspecified: Secondary | ICD-10-CM | POA: Diagnosis present

## 2018-09-25 DIAGNOSIS — C7931 Secondary malignant neoplasm of brain: Secondary | ICD-10-CM | POA: Diagnosis present

## 2018-09-25 DIAGNOSIS — M25552 Pain in left hip: Secondary | ICD-10-CM | POA: Diagnosis present

## 2018-09-25 DIAGNOSIS — E876 Hypokalemia: Secondary | ICD-10-CM | POA: Diagnosis present

## 2018-09-25 DIAGNOSIS — Z800 Family history of malignant neoplasm of digestive organs: Secondary | ICD-10-CM | POA: Diagnosis not present

## 2018-09-25 DIAGNOSIS — R531 Weakness: Secondary | ICD-10-CM | POA: Diagnosis present

## 2018-09-25 DIAGNOSIS — K219 Gastro-esophageal reflux disease without esophagitis: Secondary | ICD-10-CM | POA: Diagnosis present

## 2018-09-25 DIAGNOSIS — Z9221 Personal history of antineoplastic chemotherapy: Secondary | ICD-10-CM | POA: Diagnosis not present

## 2018-09-25 LAB — CBC WITH DIFFERENTIAL/PLATELET
Abs Immature Granulocytes: 0.01 10*3/uL (ref 0.00–0.07)
Basophils Absolute: 0 10*3/uL (ref 0.0–0.1)
Basophils Relative: 0 %
Eosinophils Absolute: 0 10*3/uL (ref 0.0–0.5)
Eosinophils Relative: 0 %
HCT: 40.2 % (ref 39.0–52.0)
Hemoglobin: 12.9 g/dL — ABNORMAL LOW (ref 13.0–17.0)
Immature Granulocytes: 0 %
Lymphocytes Relative: 37 %
Lymphs Abs: 3.7 10*3/uL (ref 0.7–4.0)
MCH: 27 pg (ref 26.0–34.0)
MCHC: 32.1 g/dL (ref 30.0–36.0)
MCV: 84.1 fL (ref 80.0–100.0)
Monocytes Absolute: 0.6 10*3/uL (ref 0.1–1.0)
Monocytes Relative: 6 %
Neutro Abs: 5.5 10*3/uL (ref 1.7–7.7)
Neutrophils Relative %: 57 %
Platelets: 400 10*3/uL (ref 150–400)
RBC: 4.78 MIL/uL (ref 4.22–5.81)
RDW: 16.2 % — ABNORMAL HIGH (ref 11.5–15.5)
WBC: 9.8 10*3/uL (ref 4.0–10.5)
nRBC: 0 % (ref 0.0–0.2)

## 2018-09-25 LAB — BASIC METABOLIC PANEL
Anion gap: 13 (ref 5–15)
BUN: 11 mg/dL (ref 6–20)
CO2: 22 mmol/L (ref 22–32)
Calcium: 9 mg/dL (ref 8.9–10.3)
Chloride: 100 mmol/L (ref 98–111)
Creatinine, Ser: 0.82 mg/dL (ref 0.61–1.24)
GFR calc Af Amer: >60 mL/min (ref >60)
GFR calc non Af Amer: >60 mL/min (ref >60)
Glucose, Bld: 114 mg/dL — ABNORMAL HIGH (ref 70–99)
Potassium: 3.7 mmol/L (ref 3.5–5.1)
Sodium: 135 mmol/L (ref 135–145)

## 2018-09-25 LAB — MAGNESIUM: Magnesium: 1.9 mg/dL (ref 1.7–2.4)

## 2018-09-25 LAB — HIV ANTIBODY (ROUTINE TESTING W REFLEX): HIV Screen 4th Generation wRfx: NONREACTIVE

## 2018-09-25 MED ORDER — PROMETHAZINE HCL 25 MG/ML IJ SOLN
12.5000 mg | Freq: Once | INTRAMUSCULAR | Status: AC
  Administered 2018-09-25: 14:00:00 12.5 mg via INTRAVENOUS

## 2018-09-25 MED ORDER — PROMETHAZINE HCL 25 MG/ML IJ SOLN
12.5000 mg | Freq: Four times a day (QID) | INTRAMUSCULAR | Status: DC | PRN
  Administered 2018-09-25: 09:00:00 12.5 mg via INTRAVENOUS
  Filled 2018-09-25: qty 1

## 2018-09-25 MED ORDER — PROCHLORPERAZINE EDISYLATE 10 MG/2ML IJ SOLN: 10.0000 mg | Freq: Four times a day (QID) | INTRAMUSCULAR | Status: DC | PRN

## 2018-09-25 MED ORDER — OXYCODONE HCL 5 MG PO TABS
15.0000 mg | ORAL_TABLET | Freq: Four times a day (QID) | ORAL | Status: DC | PRN
  Administered 2018-09-25 – 2018-09-27 (×8): 15 mg via ORAL
  Filled 2018-09-25 (×9): qty 3

## 2018-09-25 MED ORDER — SODIUM CHLORIDE 0.9% FLUSH: 10.0000 mL | INTRAVENOUS | Status: DC | PRN

## 2018-09-25 MED ORDER — PROMETHAZINE HCL 25 MG/ML IJ SOLN
12.5000 mg | Freq: Four times a day (QID) | INTRAMUSCULAR | Status: DC | PRN
  Administered 2018-09-25: 23:00:00 25 mg via INTRAVENOUS
  Filled 2018-09-25 (×2): qty 1

## 2018-09-25 NOTE — Progress Notes (Addendum)
PROGRESS NOTE    Eric Mason  VPX:[id number] DOB: Sep 25, 1970 DOA: 09/24/2018 PCP: [name], MD   Brief Narrative:   Eric Mason is a 48 y.o. male with history of lung cancer with metastasis to brain, skull, lumbar spine and associated seizure and tobacco use disorder presenting with the above complaints for 3 weeks.  Reportedly completed chemotherapy in April and is currently on radiation.  He had radiation to his left hip area about 3 weeks ago.  Since then, he had nausea, vomiting, abdominal pain, generalized weakness and fatigue and left hip pain.  Assessment & Plan:   Active Problems:   Intractable nausea and vomiting   Intractable nausea and vomiting associated with mild to moderate generalized abdominal pain Differentials include radiation induced inflammation of the GI tract versus viral gastroenteritis. Symptomatic management with antiemetics, IV fluids and pain control.  Abdominal x-ray to rule out obstruction and constipation.  Patient is on high-dose narcotics which could lead to gastroparesis and persistent symptoms. Clear liquid diet and advance as tolerated. Pt continues to have persistent nausea and dry heaving despite zofran and phenergan, compazine had to be added IV.     Hypo-kalemia and hypomagnesemia Replete as appropriate   History of lung cancer with mets to brain and bone Status post chemo and radiation. Follows up with Dr. Alen Blew and is scheduled to see Dr. Alen Blew on the 30th of this month.  Meanwhile continue with oral Decadron.  Will add Protonix to his regimen.   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    GERD continue with PPI   Severity of Illness: The appropriate patient status for this patient is INPATIENT. Inpatient status is judged to be reasonable and necessary in order to provide the required  intensity of service to ensure the patient's safety. The patient's presenting symptoms, physical exam findings, and initial radiographic and laboratory data in the context of their chronic comorbidities is felt to place them at high risk for further clinical deterioration. Furthermore, it is not anticipated that the patient will be medically stable for discharge from the hospital within 2 midnights of admission. The following factors support the patient status of inpatient.   " The patient's presenting symptoms include nausea, vomiting and abdominal pain. " The worrisome physical exam findings include mod abdominal tenderness will need further work up with a CT of the abdomen and pelvis if his symptoms doesn't improve in the next 24 hours.   " The chronic co-morbidities include metastatic lung cancer    * I certify that it is my clinical judgment that the patient will require inpatient hospital care spanning beyond 2 midnights from the point of admission due to high intensity of service, high risk for further deterioration and high frequency of surveillance required.*    DVT prophylaxis: Lovenox  code Status: Full code Family Communication: None at bedside Disposition Plan: Pending resolution of nausea vomiting   Consultants:   None.   Procedures:   none.   Antimicrobials:  None.   Subjective: Persistent nausea, dry heaving. Mild to mod abdominal pain.   Objective: Vitals:   09/24/18 1630 09/24/18 1713 09/24/18 2102 09/25/18 0442  BP:  (!) 154/97 (!) 153/112 (!) 125/103  Pulse: 74 76 92 95  Resp:  17 20 16   Temp:  98.9 F (37.2 C) 98.7 F (37.1 C) 97.8 F (36.6 C)  TempSrc:  Oral Oral  Oral  SpO2: 98% 96% 99% 100%    Intake/Output Summary (Last 24 hours) at 09/25/2018 1148 Last data filed at 09/25/2018 0700 Gross per 24 hour  Intake 2400 ml  Output 550 ml  Net 1850 ml   There were no vitals filed for this visit.  Examination:  General exam: mild to moderate  distress from abdominal pain.  Respiratory system: Clear to auscultation. Respiratory effort normal. Cardiovascular system: S1 & S2 heard, RRR. No JVD, murmurs, rubs, gallops or clicks. No pedal edema. Gastrointestinal system: Abdomen is soft, mildly tender generalized. No distention.  Central nervous system: Alert and oriented. No focal neurological deficits. Extremities: Symmetric 5 x 5 power. Skin: No rashes, lesions or ulcers Psychiatry:  Anxious,     Data Reviewed: I have personally reviewed following labs and imaging studies  CBC: Recent Labs  Lab 09/24/18 1118 09/25/18 0917  WBC 10.4 9.8  NEUTROABS 6.1 5.5  HGB 12.8* 12.9*  HCT 39.5 40.2  MCV 82.8 84.1  PLT 447* 371   Basic Metabolic Panel: Recent Labs  Lab 09/24/18 1118 09/25/18 0917  NA 136 135  K 2.9* 3.7  CL 97* 100  CO2 24 22  GLUCOSE 122* 114*  BUN 8 11  CREATININE 0.71 0.82  CALCIUM 9.3 9.0  MG 1.6*  --    GFR: CrCl cannot be calculated (Unknown ideal weight.). Liver Function Tests: Recent Labs  Lab 09/24/18 1118  AST 19  ALT 12  ALKPHOS 79  BILITOT 0.5  PROT 8.9*  ALBUMIN 3.9   Recent Labs  Lab 09/24/18 1118  LIPASE 25  25   No results for input(s): AMMONIA in the last 168 hours. Coagulation Profile: No results for input(s): INR, PROTIME in the last 168 hours. Cardiac Enzymes: No results for input(s): CKTOTAL, CKMB, CKMBINDEX, TROPONINI in the last 168 hours. BNP (last 3 results) No results for input(s): PROBNP in the last 8760 hours. HbA1C: No results for input(s): HGBA1C in the last 72 hours. CBG: No results for input(s): GLUCAP in the last 168 hours. Lipid Profile: No results for input(s): CHOL, HDL, LDLCALC, TRIG, CHOLHDL, LDLDIRECT in the last 72 hours. Thyroid Function Tests: No results for input(s): TSH, T4TOTAL, FREET4, T3FREE, THYROIDAB in the last 72 hours. Anemia Panel: No results for input(s): VITAMINB12, FOLATE, FERRITIN, TIBC, IRON, RETICCTPCT in the last 72  hours. Sepsis Labs: No results for input(s): PROCALCITON, LATICACIDVEN in the last 168 hours.  Recent Results (from the past 240 hour(s))  SARS Coronavirus 2 (CEPHEID- Performed in [place] hospital lab), Hosp Order     Status: None   Collection Time: 09/24/18  1:39 PM   Specimen: Nasopharyngeal Swab  Result Value Ref Range Status   SARS Coronavirus 2 NEGATIVE NEGATIVE Final    Comment: (NOTE) If result is NEGATIVE SARS-CoV-2 target nucleic acids are NOT DETECTED. The SARS-CoV-2 RNA is generally detectable in upper and lower  respiratory specimens during the acute phase of infection. The lowest  concentration of SARS-CoV-2 viral copies this assay can detect is 250  copies / mL. A negative result does not preclude SARS-CoV-2 infection  and should not be used as the sole basis for treatment or other  patient management decisions.  A negative result may occur with  improper specimen collection / handling, submission of specimen other  than nasopharyngeal swab, presence of viral mutation(s) within the  areas targeted by this assay, and inadequate number of viral copies  (<250 copies / mL). A negative result must be combined with  clinical  observations, patient history, and epidemiological information. If result is POSITIVE SARS-CoV-2 target nucleic acids are DETECTED. The SARS-CoV-2 RNA is generally detectable in upper and lower  respiratory specimens dur ing the acute phase of infection.  Positive  results are indicative of active infection with SARS-CoV-2.  Clinical  correlation with patient history and other diagnostic information is  necessary to determine patient infection status.  Positive results do  not rule out bacterial infection or co-infection with other viruses. If result is PRESUMPTIVE POSTIVE SARS-CoV-2 nucleic acids MAY BE PRESENT.   A presumptive positive result was obtained on the submitted specimen  and confirmed on repeat testing.  While 2019 novel coronavirus   (SARS-CoV-2) nucleic acids may be present in the submitted sample  additional confirmatory testing may be necessary for epidemiological  and / or clinical management purposes  to differentiate between  SARS-CoV-2 and other Sarbecovirus currently known to infect humans.  If clinically indicated additional testing with an alternate test  methodology [phone]) is advised. The SARS-CoV-2 RNA is generally  detectable in upper and lower respiratory sp ecimens during the acute  phase of infection. The expected result is Negative. Fact Sheet for Patients:  StrictlyIdeas.no Fact Sheet for Healthcare Providers: BankingDealers.co.za This test is not yet approved or cleared by the [place] FDA and has been authorized for detection and/or diagnosis of SARS-CoV-2 by FDA under an Emergency Use Authorization (EUA).  This EUA will remain in effect (meaning this test can be used) for the duration of the COVID-19 declaration under Section 564(b)(1) of the Act, 21 U.S.C. section 360bbb-3(b)(1), unless the authorization is terminated or revoked sooner. Performed at [facility], [place] [address] [place], [place] [address]          Radiology Studies: Dg Abd 1 View  Result Date: 09/25/2018 CLINICAL DATA:  Left-sided abdominal pain. EXAM: ABDOMEN - 1 VIEW COMPARISON:  Radiographs of September 26, 2016. FINDINGS: The bowel gas pattern is normal. No significant stool burden is noted. No radio-opaque calculi or other significant radiographic abnormality are seen. IMPRESSION: No evidence of bowel obstruction or ileus. Electronically Signed   By: Marijo Conception M.D.   On: 09/25/2018 11:41        Scheduled Meds: . amitriptyline  75 mg Oral QHS  . dexamethasone  4 mg Oral BID WC  . enoxaparin (LOVENOX) injection  40 mg Subcutaneous Q24H  . folic acid  1 mg Oral Daily  . gabapentin  600 mg Oral TID  . levETIRAcetam  2,000 mg Oral BID  .  nicotine  14 mg Transdermal Daily  . pantoprazole  40 mg Oral BID  . senna  1 tablet Oral BID   Continuous Infusions: . lactated ringers 100 mL/hr at 09/25/18 0335     LOS: 0 days    Time spent: 31 minutes.     Hosie Poisson, MD [facility] Pager [phone]   If 7PM-7AM, please contact night-coverage [email or web address] Password [facility] 09/25/2018, 11:48 AM

## 2018-09-26 LAB — BASIC METABOLIC PANEL
Anion gap: 6 (ref 5–15)
BUN: 12 mg/dL (ref 6–20)
CO2: 25 mmol/L (ref 22–32)
Calcium: 8.8 mg/dL — ABNORMAL LOW (ref 8.9–10.3)
Chloride: 107 mmol/L (ref 98–111)
Creatinine, Ser: 0.82 mg/dL (ref 0.61–1.24)
GFR calc Af Amer: >60 mL/min (ref >60)
GFR calc non Af Amer: >60 mL/min (ref >60)
Glucose, Bld: 134 mg/dL — ABNORMAL HIGH (ref 70–99)
Potassium: 4.1 mmol/L (ref 3.5–5.1)
Sodium: 138 mmol/L (ref 135–145)

## 2018-09-26 MED ORDER — SENNOSIDES-DOCUSATE SODIUM 8.6-50 MG PO TABS
2.0000 | ORAL_TABLET | Freq: Two times a day (BID) | ORAL | Status: DC
  Administered 2018-09-26 – 2018-09-27 (×2): 2 via ORAL
  Filled 2018-09-26 (×2): qty 2

## 2018-09-26 MED ORDER — LIDOCAINE 5 % EX PTCH
1.0000 | MEDICATED_PATCH | CUTANEOUS | Status: DC
  Administered 2018-09-26 – 2018-09-27 (×2): 1 via TRANSDERMAL
  Filled 2018-09-26 (×2): qty 1

## 2018-09-26 MED ORDER — POLYETHYLENE GLYCOL 3350 17 G PO PACK
17.0000 g | PACK | Freq: Two times a day (BID) | ORAL | Status: DC
  Administered 2018-09-27: 17 g via ORAL
  Filled 2018-09-26: qty 1

## 2018-09-26 MED ORDER — BISACODYL 5 MG PO TBEC: 10.0000 mg | DELAYED_RELEASE_TABLET | Freq: Every day | ORAL | Status: DC | PRN

## 2018-09-26 NOTE — TOC Initial Note (Signed)
Transition of Care [facility]) - Initial/Assessment Note    Patient Details  Name: Indio Santilli MRN: [id number] Date of Birth: [birth year]  Transition of Care [facility]) CM/SW Contact:    Nila Nephew, LCSW Phone Number: 09/26/2018, 9:52 AM  Clinical Narrative:         Completed high readmission risk screening due to score 25%. Pt with lung cancer with mets, currently receiving treatment.                   Activities of Daily Living Home Assistive Devices/Equipment: Eyeglasses ADL Screening (condition at time of admission) Patient's cognitive ability adequate to safely complete daily activities?: Yes Is the patient deaf or have difficulty hearing?: No Does the patient have difficulty seeing, even when wearing glasses/contacts?: No Does the patient have difficulty concentrating, remembering, or making decisions?: No Patient able to express need for assistance with ADLs?: No Does the patient have difficulty dressing or bathing?: No Independently performs ADLs?: Yes (appropriate for developmental age) Does the patient have difficulty walking or climbing stairs?: No Weakness of Legs: None Weakness of Arms/Hands: None  Permission Sought/Granted                  Emotional Assessment              Admission diagnosis:  Hypokalemia [E87.6] Generalized weakness [R53.1] Non-intractable vomiting with nausea, unspecified vomiting type [R11.2] Patient Active Problem List   Diagnosis Date Noted  . Intractable nausea and vomiting 09/24/2018  . 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 Encounter for antineoplastic chemotherapy 07/30/2017  . Encounter for antineoplastic immunotherapy 07/30/2017  . Port-A-Cath in place 06/18/2017  . Lung cancer ([place]) 06/03/2017  . Genetic testing 05/27/2017  . Family history of colon cancer 05/08/2017  . Family history of cholangiocarcinoma 05/08/2017  . Family history of breast cancer  05/08/2017  . Family history of renal cancer 05/08/2017  . Family history of thyroid cancer 05/08/2017  . Abnormal stress test 11/08/2016  . Bilateral leg pain 11/08/2016  . Gastroesophageal reflux disease 10/10/2016  . Atypical chest pain 10/10/2016  . Tobacco abuse 07/04/2016  . Brain metastasis ([place]) 07/02/2016  . Pulmonary nodule 06/19/2016  . Seizure ([place]) 06/17/2016   PCP:  [name], MD Pharmacy:   [facility] [address] [place] [address] Phone: [phone] Fax: [phone]     Social Determinants of Health (SDOH) Interventions    Readmission Risk Interventions Readmission Risk Prevention Plan 09/26/2018  Transportation Screening Complete  PCP or Specialist Appt within 3-5 Days Complete  HRI or [place] Not Complete  HRI or Home Care Consult comments no indication  Social Work Consult for [place] Planning/Counseling Complete  Palliative Care Screening Not Applicable  Medication Review [employer]) Complete

## 2018-09-26 NOTE — Progress Notes (Signed)
Patient had BM, reported hasn't had BM in about a week. Pain still uncontrolled, unable to wean off IV pain med to oral. Patient stating oral is still not effective. No longer refusing nicotine patch, applied to right arm. Will continue to monitor.

## 2018-09-26 NOTE — Progress Notes (Signed)
Patient agreed to nicotine patch, applied at 2130 to left arm. Will continue to monitor.

## 2018-09-26 NOTE — Progress Notes (Signed)
PROGRESS NOTE    Eric Mason  SFK:[id number] DOB: 09/01/1970 DOA: 09/24/2018 PCP: [name], MD   Brief Narrative:   Eric Mason is a 48 y.o. male with history of lung cancer with metastasis to brain, skull, lumbar spine and associated seizure and tobacco use disorder presenting with the above complaints for 3 weeks.  Reportedly completed chemotherapy in April and is currently on radiation.  He had radiation to his left hip area about 3 weeks ago.  Since then, he had nausea, vomiting, abdominal pain, generalized weakness and fatigue and left hip pain.  Assessment & Plan:   Active Problems:   Intractable nausea and vomiting   Intractable nausea and vomiting associated with mild to moderate generalized abdominal pain Differentials include radiation induced inflammation of the GI tract versus viral gastroenteritis. Symptomatic management with antiemetics, IV fluids and pain control.  Abdominal x-ray to rule out obstruction and constipation.  Patient is on high-dose narcotics which could lead to gastroparesis and persistent symptoms. Clear liquid diet and advance as tolerated. Advanced to soft diet today . His nausea is better today and no vomiting so far. But pt reports persistent back pain from radiation and bone mets and some abdominal pain.  He reports no BM since 3 days. Will start him on senna, colace and miralax .  Transition to oral anti emetics and see if his symptoms do not reoccur.     Hypo-kalemia and hypomagnesemia Replete as appropriate   History of lung cancer with mets to brain and bone Status post chemo and radiation. Follows up with Dr. Alen Blew and is scheduled to see Dr. Alen Blew on the 30th of this month.  Meanwhile continue with oral Decadron.     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    GERD continue with PPI    DVT  prophylaxis: Lovenox  code Status: Full code Family Communication: None at bedside Disposition Plan: possible discharge tomorrow if he does not have recurrent nausea , vomiting and abd pain improved and he has a BM.    Consultants:   None.   Procedures:   none.   Antimicrobials:  None.   Subjective: NAUSEA improved with phenergan, but still has abdominal pain and back pain.   Objective: Vitals:   09/25/18 0442 09/25/18 1559 09/25/18 2130 09/26/18 0442  BP: (!) 125/103 (!) 117/100 113/86 90/64  Pulse: 95 (!) 108 95 68  Resp: 16 20 17 18   Temp: 97.8 F (36.6 C) 98.6 F (37 C) 98.3 F (36.8 C) 98.8 F (37.1 C)  TempSrc: Oral Oral  Oral  SpO2: 100% 100% 98% 98%    Intake/Output Summary (Last 24 hours) at 09/26/2018 1217 Last data filed at 09/26/2018 0140 Gross per 24 hour  Intake -  Output 400 ml  Net -400 ml   There were no vitals filed for this visit.  Examination:  General exam: alert and appears comfortable.  Respiratory system: Clear to auscultation. Respiratory effort normal. Cardiovascular system: S1 & S2 heard, RRR. No JVD, murmurs, rubs, gallops or clicks. No pedal edema. Gastrointestinal system: Abdomen is soft, mildly tender generalized. No distention.  Central nervous system: Alert and oriented. No focal neurological deficits. Extremities: Symmetric 5 x 5 power. Skin: No rashes, lesions or ulcers Psychiatry:  Mood normal.     Data Reviewed: I have personally reviewed following labs and imaging studies  CBC: Recent Labs  Lab 09/24/18 1118 09/25/18 0917  WBC 10.4 9.8  NEUTROABS 6.1 5.5  HGB 12.8* 12.9*  HCT 39.5 40.2  MCV 82.8 84.1  PLT 447* 626   Basic Metabolic Panel: Recent Labs  Lab 09/24/18 1118 09/25/18 0915 09/25/18 0917 09/26/18 0433  NA 136  --  135 138  K 2.9*  --  3.7 4.1  CL 97*  --  100 107  CO2 24  --  22 25  GLUCOSE 122*  --  114* 134*  BUN 8  --  11 12  CREATININE 0.71  --  0.82 0.82  CALCIUM 9.3  --  9.0 8.8*   MG 1.6* 1.9  --   --    GFR: CrCl cannot be calculated (Unknown ideal weight.). Liver Function Tests: Recent Labs  Lab 09/24/18 1118  AST 19  ALT 12  ALKPHOS 79  BILITOT 0.5  PROT 8.9*  ALBUMIN 3.9   Recent Labs  Lab 09/24/18 1118  LIPASE 25  25   No results for input(s): AMMONIA in the last 168 hours. Coagulation Profile: No results for input(s): INR, PROTIME in the last 168 hours. Cardiac Enzymes: No results for input(s): CKTOTAL, CKMB, CKMBINDEX, TROPONINI in the last 168 hours. BNP (last 3 results) No results for input(s): PROBNP in the last 8760 hours. HbA1C: No results for input(s): HGBA1C in the last 72 hours. CBG: No results for input(s): GLUCAP in the last 168 hours. Lipid Profile: No results for input(s): CHOL, HDL, LDLCALC, TRIG, CHOLHDL, LDLDIRECT in the last 72 hours. Thyroid Function Tests: No results for input(s): TSH, T4TOTAL, FREET4, T3FREE, THYROIDAB in the last 72 hours. Anemia Panel: No results for input(s): VITAMINB12, FOLATE, FERRITIN, TIBC, IRON, RETICCTPCT in the last 72 hours. Sepsis Labs: No results for input(s): PROCALCITON, LATICACIDVEN in the last 168 hours.  Recent Results (from the past 240 hour(s))  SARS Coronavirus 2 (CEPHEID- Performed in [place] hospital lab), Hosp Order     Status: None   Collection Time: 09/24/18  1:39 PM   Specimen: Nasopharyngeal Swab  Result Value Ref Range Status   SARS Coronavirus 2 NEGATIVE NEGATIVE Final    Comment: (NOTE) If result is NEGATIVE SARS-CoV-2 target nucleic acids are NOT DETECTED. The SARS-CoV-2 RNA is generally detectable in upper and lower  respiratory specimens during the acute phase of infection. The lowest  concentration of SARS-CoV-2 viral copies this assay can detect is 250  copies / mL. A negative result does not preclude SARS-CoV-2 infection  and should not be used as the sole basis for treatment or other  patient management decisions.  A negative result may occur with   improper specimen collection / handling, submission of specimen other  than nasopharyngeal swab, presence of viral mutation(s) within the  areas targeted by this assay, and inadequate number of viral copies  (<250 copies / mL). A negative result must be combined with clinical  observations, patient history, and epidemiological information. If result is POSITIVE SARS-CoV-2 target nucleic acids are DETECTED. The SARS-CoV-2 RNA is generally detectable in upper and lower  respiratory specimens dur ing the acute phase of infection.  Positive  results are indicative of active infection with SARS-CoV-2.  Clinical  correlation with patient history and other diagnostic information is  necessary to determine patient infection status.  Positive results do  not rule out bacterial infection or co-infection with other viruses. If result is PRESUMPTIVE POSTIVE SARS-CoV-2 nucleic acids MAY BE PRESENT.   A presumptive positive result was obtained on the  submitted specimen  and confirmed on repeat testing.  While 2019 novel coronavirus  (SARS-CoV-2) nucleic acids may be present in the submitted sample  additional confirmatory testing may be necessary for epidemiological  and / or clinical management purposes  to differentiate between  SARS-CoV-2 and other Sarbecovirus currently known to infect humans.  If clinically indicated additional testing with an alternate test  methodology [phone]) is advised. The SARS-CoV-2 RNA is generally  detectable in upper and lower respiratory sp ecimens during the acute  phase of infection. The expected result is Negative. Fact Sheet for Patients:  StrictlyIdeas.no Fact Sheet for Healthcare Providers: BankingDealers.co.za This test is not yet approved or cleared by the [place] FDA and has been authorized for detection and/or diagnosis of SARS-CoV-2 by FDA under an Emergency Use Authorization (EUA).  This EUA will  remain in effect (meaning this test can be used) for the duration of the COVID-19 declaration under Section 564(b)(1) of the Act, 21 U.S.C. section 360bbb-3(b)(1), unless the authorization is terminated or revoked sooner. Performed at [facility], [place] [address], [place], [place] [address]          Radiology Studies: Dg Abd 1 View  Result Date: 09/25/2018 CLINICAL DATA:  Left-sided abdominal pain. EXAM: ABDOMEN - 1 VIEW COMPARISON:  Radiographs of September 26, 2016. FINDINGS: The bowel gas pattern is normal. No significant stool burden is noted. No radio-opaque calculi or other significant radiographic abnormality are seen. IMPRESSION: No evidence of bowel obstruction or ileus. Electronically Signed   By: Marijo Conception M.D.   On: 09/25/2018 11:41        Scheduled Meds: . amitriptyline  75 mg Oral QHS  . dexamethasone  4 mg Oral BID WC  . enoxaparin (LOVENOX) injection  40 mg Subcutaneous Q24H  . folic acid  1 mg Oral Daily  . gabapentin  600 mg Oral TID  . levETIRAcetam  2,000 mg Oral BID  . lidocaine  1 patch Transdermal Q24H  . nicotine  14 mg Transdermal Daily  . pantoprazole  40 mg Oral BID  . senna  1 tablet Oral BID   Continuous Infusions:    LOS: 1 day    Time spent: 3o minutes.     Hosie Poisson, MD [facility] Pager [phone]   If 7PM-7AM, please contact night-coverage [email or web address] Password [facility] 09/26/2018, 12:17 PM

## 2018-09-27 DIAGNOSIS — R531 Weakness: Secondary | ICD-10-CM

## 2018-09-27 MED ORDER — HEPARIN SOD (PORK) LOCK FLUSH 100 UNIT/ML IV SOLN
500.0000 [IU] | INTRAVENOUS | Status: DC
  Filled 2018-09-27: qty 5

## 2018-09-27 MED ORDER — HEPARIN SOD (PORK) LOCK FLUSH 100 UNIT/ML IV SOLN
500.0000 [IU] | INTRAVENOUS | Status: DC | PRN
  Filled 2018-09-27: qty 5

## 2018-09-27 MED ORDER — SENNOSIDES-DOCUSATE SODIUM 8.6-50 MG PO TABS: 2.0000 | ORAL_TABLET | Freq: Two times a day (BID) | ORAL | 1 refills | Status: DC

## 2018-09-27 MED ORDER — BISACODYL 5 MG PO TBEC: 10.0000 mg | DELAYED_RELEASE_TABLET | Freq: Every day | ORAL | 0 refills | Status: DC | PRN

## 2018-09-27 MED ORDER — PROMETHAZINE HCL 25 MG PO TABS: 25.0000 mg | ORAL_TABLET | Freq: Four times a day (QID) | ORAL | Status: DC | PRN

## 2018-09-27 MED ORDER — PROMETHAZINE HCL 25 MG PO TABS: 25.0000 mg | ORAL_TABLET | Freq: Four times a day (QID) | ORAL | 0 refills | Status: DC | PRN

## 2018-09-27 MED ORDER — POLYETHYLENE GLYCOL 3350 17 G PO PACK: 17.0000 g | PACK | Freq: Every day | ORAL | 0 refills | Status: DC | PRN

## 2018-09-27 NOTE — Discharge Summary (Signed)
Physician Discharge Summary  Eric Mason WHQ:[id number] DOB: Oct 18, 1970 DOA: 09/24/2018  PCP: [name], MD  Admit date: 09/24/2018 Discharge date: 09/27/2018  Admitted From: Home.  Disposition:  HOme.   Recommendations for Outpatient Follow-up:  1. Follow up with PCP in 1-2 weeks 2. Please obtain BMP/CBC in one week 3. Please follow up with oncology as recommended.     Discharge Condition:stable.  CODE STATUS:full code.  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 Reportedly completed chemotherapy in April and is currently on radiation. He had radiation to his left hip area about 3 weeks ago.Since then, he had nausea, vomiting, abdominal pain, generalized weakness and fatigue and left hip pain.   Discharge Diagnoses:  Active Problems:   Intractable nausea and vomiting   Intractable nausea and vomiting associated with mild to moderate generalized abdominal pain Differentials include radiation induced inflammation of the GI tract versus viral gastroenteritis. Symptomatic management with antiemetics, IV fluids and pain control.  Abdominal x-ray to rule out obstruction and constipation.  Patient is on high-dose narcotics which could lead to gastroparesis and persistent symptoms. Clear liquid diet and advance as tolerated. Advanced to soft diet . His nausea is better today and no vomiting so far. He reports no BM since 3 days. Started him on senna, colace and miralax and he had a BM prior to discharge.     Hypo-kalemia and hypomagnesemia Repleted as appropriate   History of lung cancer with mets to brain and bone Status post chemo and radiation. Follows up with Dr. Alen Blew and is scheduled to see Dr. Alen Blew on the 30th of this month.   Meanwhile continue with oral Decadron.     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    GERD continue with PPI  Discharge Instructions  Discharge Instructions    Diet - low sodium heart healthy   Complete by: As directed    Discharge instructions   Complete by: As directed    Follow up with Dr Alen Blew as recommended.     Allergies as of 09/27/2018      Reactions   Latex Itching, Rash      Medication List    STOP taking these medications   megestrol 400 MG/10ML suspension Commonly known as: MEGACE   prochlorperazine 10 MG tablet Commonly known as: COMPAZINE   ranolazine 500 MG 12 hr tablet Commonly known as: Ranexa     TAKE these medications   albuterol 108 (90 Base) MCG/ACT inhaler Commonly known as: VENTOLIN HFA Inhale 2 puffs into the lungs every 6 (six) hours as needed for wheezing or shortness of breath.   amitriptyline 75 MG tablet Commonly known as: ELAVIL Take 1 tablet (75 mg total) by mouth at bedtime.   B-complex with vitamin C tablet Take 1 tablet by mouth daily.   bisacodyl 5 MG EC tablet Commonly known as: DULCOLAX Take 2 tablets (10 mg total) by mouth daily as needed for moderate constipation.   Calcium Carb-Cholecalciferol 500-400 MG-UNIT Tabs Commonly known as: Calcium 500 +D 1 tab PO BID   dexamethasone 4 MG tablet Commonly known as: DECADRON Take 1 tablet (4 mg total) by mouth 2 (two) times daily with a meal.   DULoxetine 30 MG  capsule Commonly known as: CYMBALTA Take 1 capsule (30 mg total) by mouth daily. What changed: when to take this   folic acid 1 MG tablet Commonly known as: FOLVITE Take 1 tablet (1 mg total) by mouth daily.   gabapentin 600 MG tablet Commonly known as: NEURONTIN Take 1 tablet (600 mg total) by mouth 3 (three) times daily.   levETIRAcetam 1000 MG tablet Commonly known as:  KEPPRA Take 2 tablets (2,000 mg total) by mouth 2 (two) times daily.   lidocaine-prilocaine cream Commonly known as: EMLA Apply 1 application topically as needed.   LORazepam 1 MG tablet Commonly known as: ATIVAN Take 2 tablets (2 mg total) by mouth every 8 (eight) hours as needed for seizure (emergency med for focal seizure).   multivitamin tablet Take 1 tablet by mouth daily.   nicotine 21 mg/24hr patch Commonly known as: NICODERM CQ - dosed in mg/24 hours Place 1 patch (21 mg total) onto the skin daily.   nystatin 100000 UNIT/ML suspension Commonly known as: MYCOSTATIN Take 5 mLs (500,000 Units total) by mouth 4 (four) times daily.   oxyCODONE 15 MG immediate release tablet Commonly known as: ROXICODONE Take 1 tablet (15 mg total) by mouth every 4 (four) hours as needed for pain.   pantoprazole 40 MG tablet Commonly known as: PROTONIX Take 1 tablet (40 mg total) by mouth 2 (two) times daily.   polyethylene glycol 17 g packet Commonly known as: MIRALAX / GLYCOLAX Take 17 g by mouth daily as needed.   promethazine 25 MG tablet Commonly known as: PHENERGAN Take 1 tablet (25 mg total) by mouth every 6 (six) hours as needed for nausea or vomiting.   senna-docusate 8.6-50 MG tablet Commonly known as: Senokot-S Take 2 tablets by mouth 2 (two) times daily. What changed:   how much to take  when to take this   thiamine 100 MG tablet Commonly known as: VITAMIN B-1 Take 1 tablet (100 mg total) by mouth daily.      Follow-up Information    [name], MD. Schedule an appointment as soon as possible for a visit in 1 week(s).   Specialty: Internal Medicine Contact information: [address] [place] [address] [phone]          Allergies  Allergen Reactions  . Latex Itching and Rash    Consultations:  None.    Procedures/Studies: Dg Abd 1 View  Result Date: 09/25/2018 CLINICAL DATA:  Left-sided abdominal pain. EXAM: ABDOMEN - 1 VIEW  COMPARISON:  Radiographs of September 26, 2016. FINDINGS: The bowel gas pattern is normal. No significant stool burden is noted. No radio-opaque calculi or other significant radiographic abnormality are seen. IMPRESSION: No evidence of bowel obstruction or ileus. Electronically Signed   By: Marijo Conception M.D.   On: 09/25/2018 11:41       Subjective: No new complaints.   Discharge Exam: Vitals:   09/26/18 2008 09/27/18 0405  BP: 126/87 93/66  Pulse: 69 66  Resp: 17 15  Temp: 98.5 F (36.9 C) 98.2 F (36.8 C)  SpO2: 100% 97%   Vitals:   09/26/18 1358 09/26/18 2008 09/27/18 0405 09/27/18 1208  BP: 102/82 126/87 93/66   Pulse: 72 69 66   Resp: 16 17 15    Temp: 98.8 F (37.1 C) 98.5 F (36.9 C) 98.2 F (36.8 C)   TempSrc: Oral Oral Oral   SpO2: 98% 100% 97%   Weight:    63.5 kg  Height:  5\' 10"  (1.778 m)    General: Pt is alert, awake, not in acute distress Cardiovascular: RRR, S1/S2 +, no rubs, no gallops Respiratory: CTA bilaterally, no wheezing, no rhonchi Abdominal: Soft, NT, ND, bowel sounds + Extremities: no edema, no cyanosis    The results of significant diagnostics from this hospitalization (including imaging, microbiology, ancillary and laboratory) are listed below for reference.     Microbiology: Recent Results (from the past 240 hour(s))  SARS Coronavirus 2 (CEPHEID- Performed in [place] hospital lab), Hosp Order     Status: None   Collection Time: 09/24/18  1:39 PM   Specimen: Nasopharyngeal Swab  Result Value Ref Range Status   SARS Coronavirus 2 NEGATIVE NEGATIVE Final    Comment: (NOTE) If result is NEGATIVE SARS-CoV-2 target nucleic acids are NOT DETECTED. The SARS-CoV-2 RNA is generally detectable in upper and lower  respiratory specimens during the acute phase of infection. The lowest  concentration of SARS-CoV-2 viral copies this assay can detect is 250  copies / mL. A negative result does not preclude SARS-CoV-2 infection  and should not  be used as the sole basis for treatment or other  patient management decisions.  A negative result may occur with  improper specimen collection / handling, submission of specimen other  than nasopharyngeal swab, presence of viral mutation(s) within the  areas targeted by this assay, and inadequate number of viral copies  (<250 copies / mL). A negative result must be combined with clinical  observations, patient history, and epidemiological information. If result is POSITIVE SARS-CoV-2 target nucleic acids are DETECTED. The SARS-CoV-2 RNA is generally detectable in upper and lower  respiratory specimens dur ing the acute phase of infection.  Positive  results are indicative of active infection with SARS-CoV-2.  Clinical  correlation with patient history and other diagnostic information is  necessary to determine patient infection status.  Positive results do  not rule out bacterial infection or co-infection with other viruses. If result is PRESUMPTIVE POSTIVE SARS-CoV-2 nucleic acids MAY BE PRESENT.   A presumptive positive result was obtained on the submitted specimen  and confirmed on repeat testing.  While 2019 novel coronavirus  (SARS-CoV-2) nucleic acids may be present in the submitted sample  additional confirmatory testing may be necessary for epidemiological  and / or clinical management purposes  to differentiate between  SARS-CoV-2 and other Sarbecovirus currently known to infect humans.  If clinically indicated additional testing with an alternate test  methodology [id number]) is advised. The SARS-CoV-2 RNA is generally  detectable in upper and lower respiratory sp ecimens during the acute  phase of infection. The expected result is Negative. Fact Sheet for Patients:  StrictlyIdeas.no Fact Sheet for Healthcare Providers: BankingDealers.co.za This test is not yet approved or cleared by the [place] FDA and has been authorized  for detection and/or diagnosis of SARS-CoV-2 by FDA under an Emergency Use Authorization (EUA).  This EUA will remain in effect (meaning this test can be used) for the duration of the COVID-19 declaration under Section 564(b)(1) of the Act, 21 U.S.C. section 360bbb-3(b)(1), unless the authorization is terminated or revoked sooner. Performed at [facility], [place] [address] [place], [place] [address]      Labs: BNP (last 3 results) No results for input(s): BNP in the last 8760 hours. Basic Metabolic Panel: Recent Labs  Lab 09/24/18 1118 09/25/18 0915 09/25/18 0917 09/26/18 0433  NA 136  --  135 138  K 2.9*  --  3.7 4.1  CL 97*  --  100 107  CO2 24  --  22 25  GLUCOSE 122*  --  114* 134*  BUN 8  --  11 12  CREATININE 0.71  --  0.82 0.82  CALCIUM 9.3  --  9.0 8.8*  MG 1.6* 1.9  --   --    Liver Function Tests: Recent Labs  Lab 09/24/18 1118  AST 19  ALT 12  ALKPHOS 79  BILITOT 0.5  PROT 8.9*  ALBUMIN 3.9   Recent Labs  Lab 09/24/18 1118  LIPASE 25  25   No results for input(s): AMMONIA in the last 168 hours. CBC: Recent Labs  Lab 09/24/18 1118 09/25/18 0917  WBC 10.4 9.8  NEUTROABS 6.1 5.5  HGB 12.8* 12.9*  HCT 39.5 40.2  MCV 82.8 84.1  PLT 447* 400   Cardiac Enzymes: No results for input(s): CKTOTAL, CKMB, CKMBINDEX, TROPONINI in the last 168 hours. BNP: Invalid input(s): POCBNP CBG: No results for input(s): GLUCAP in the last 168 hours. D-Dimer No results for input(s): DDIMER in the last 72 hours. Hgb A1c No results for input(s): HGBA1C in the last 72 hours. Lipid Profile No results for input(s): CHOL, HDL, LDLCALC, TRIG, CHOLHDL, LDLDIRECT in the last 72 hours. Thyroid function studies No results for input(s): TSH, T4TOTAL, T3FREE, THYROIDAB in the last 72 hours.  Invalid input(s): FREET3 Anemia work up No results for input(s): VITAMINB12, FOLATE, FERRITIN, TIBC, IRON, RETICCTPCT in the last 72 hours. Urinalysis     Component Value Date/Time   COLORURINE STRAW (A) 09/24/2018 1244   APPEARANCEUR CLEAR 09/24/2018 1244   LABSPEC 1.009 09/24/2018 1244   PHURINE 8.0 09/24/2018 1244   GLUCOSEU NEGATIVE 09/24/2018 1244   HGBUR SMALL (A) 09/24/2018 1244   BILIRUBINUR NEGATIVE 09/24/2018 1244   KETONESUR 20 (A) 09/24/2018 1244   PROTEINUR NEGATIVE 09/24/2018 1244   NITRITE NEGATIVE 09/24/2018 1244   LEUKOCYTESUR NEGATIVE 09/24/2018 1244   Sepsis Labs Invalid input(s): PROCALCITONIN,  WBC,  LACTICIDVEN Microbiology Recent Results (from the past 240 hour(s))  SARS Coronavirus 2 (CEPHEID- Performed in [place] hospital lab), Hosp Order     Status: None   Collection Time: 09/24/18  1:39 PM   Specimen: Nasopharyngeal Swab  Result Value Ref Range Status   SARS Coronavirus 2 NEGATIVE NEGATIVE Final    Comment: (NOTE) If result is NEGATIVE SARS-CoV-2 target nucleic acids are NOT DETECTED. The SARS-CoV-2 RNA is generally detectable in upper and lower  respiratory specimens during the acute phase of infection. The lowest  concentration of SARS-CoV-2 viral copies this assay can detect is 250  copies / mL. A negative result does not preclude SARS-CoV-2 infection  and should not be used as the sole basis for treatment or other  patient management decisions.  A negative result may occur with  improper specimen collection / handling, submission of specimen other  than nasopharyngeal swab, presence of viral mutation(s) within the  areas targeted by this assay, and inadequate number of viral copies  (<250 copies / mL). A negative result must be combined with clinical  observations, patient history, and epidemiological information. If result is POSITIVE SARS-CoV-2 target nucleic acids are DETECTED. The SARS-CoV-2 RNA is generally detectable in upper and lower  respiratory specimens dur ing the acute phase of infection.  Positive  results are indicative of active infection with SARS-CoV-2.  Clinical   correlation with patient history and other diagnostic information is  necessary to determine patient infection status.  Positive results do  not rule out bacterial infection or co-infection with other viruses. If result is PRESUMPTIVE POSTIVE SARS-CoV-2 nucleic acids MAY BE PRESENT.   A presumptive positive result was obtained on the submitted specimen  and confirmed on repeat testing.  While 2019 novel coronavirus  (SARS-CoV-2) nucleic acids may be present in the submitted sample  additional confirmatory testing may be necessary for epidemiological  and / or clinical management purposes  to differentiate between  SARS-CoV-2 and other Sarbecovirus currently known to infect humans.  If clinically indicated additional testing with an alternate test  methodology [id number]) is advised. The SARS-CoV-2 RNA is generally  detectable in upper and lower respiratory sp ecimens during the acute  phase of infection. The expected result is Negative. Fact Sheet for Patients:  StrictlyIdeas.no Fact Sheet for Healthcare Providers: BankingDealers.co.za This test is not yet approved or cleared by the [place] FDA and has been authorized for detection and/or diagnosis of SARS-CoV-2 by FDA under an Emergency Use Authorization (EUA).  This EUA will remain in effect (meaning this test can be used) for the duration of the COVID-19 declaration under Section 564(b)(1) of the Act, 21 U.S.C. section 360bbb-3(b)(1), unless the authorization is terminated or revoked sooner. Performed at [facility], [place] [address] [place], [place] [address]      Time coordinating discharge: 31 minutes  SIGNED:   Hosie Poisson, MD  [facility] 09/27/2018, 3:19 PM Pager   If 7PM-7AM, please contact night-coverage [email or web address] Password [email or web address]

## 2018-09-30 ENCOUNTER — Other Ambulatory Visit: Payer: Self-pay

## 2018-09-30 DIAGNOSIS — C3490 Malignant neoplasm of unspecified part of unspecified bronchus or lung: Secondary | ICD-10-CM

## 2018-10-01 ENCOUNTER — Inpatient Hospital Stay (HOSPITAL_BASED_OUTPATIENT_CLINIC_OR_DEPARTMENT_OTHER): Payer: Medicaid Other | Admitting: Oncology

## 2018-10-01 ENCOUNTER — Inpatient Hospital Stay: Payer: Medicaid Other

## 2018-10-01 ENCOUNTER — Inpatient Hospital Stay: Payer: Medicaid Other | Attending: Internal Medicine

## 2018-10-01 ENCOUNTER — Other Ambulatory Visit: Payer: Self-pay

## 2018-10-01 DIAGNOSIS — C3490 Malignant neoplasm of unspecified part of unspecified bronchus or lung: Secondary | ICD-10-CM

## 2018-10-01 DIAGNOSIS — Z923 Personal history of irradiation: Secondary | ICD-10-CM | POA: Insufficient documentation

## 2018-10-01 DIAGNOSIS — J439 Emphysema, unspecified: Secondary | ICD-10-CM

## 2018-10-01 DIAGNOSIS — Z95828 Presence of other vascular implants and grafts: Secondary | ICD-10-CM

## 2018-10-01 DIAGNOSIS — Z79899 Other long term (current) drug therapy: Secondary | ICD-10-CM | POA: Insufficient documentation

## 2018-10-01 DIAGNOSIS — C801 Malignant (primary) neoplasm, unspecified: Secondary | ICD-10-CM

## 2018-10-01 DIAGNOSIS — G893 Neoplasm related pain (acute) (chronic): Secondary | ICD-10-CM

## 2018-10-01 DIAGNOSIS — C7931 Secondary malignant neoplasm of brain: Secondary | ICD-10-CM | POA: Diagnosis not present

## 2018-10-01 DIAGNOSIS — M79604 Pain in right leg: Secondary | ICD-10-CM

## 2018-10-01 DIAGNOSIS — M79605 Pain in left leg: Secondary | ICD-10-CM

## 2018-10-01 DIAGNOSIS — C763 Malignant neoplasm of pelvis: Secondary | ICD-10-CM

## 2018-10-01 DIAGNOSIS — C7951 Secondary malignant neoplasm of bone: Secondary | ICD-10-CM

## 2018-10-01 LAB — CBC WITH DIFFERENTIAL (CANCER CENTER ONLY)
Abs Immature Granulocytes: 0.04 10*3/uL (ref 0.00–0.07)
Basophils Absolute: 0 10*3/uL (ref 0.0–0.1)
Basophils Relative: 0 %
Eosinophils Absolute: 0 10*3/uL (ref 0.0–0.5)
Eosinophils Relative: 0 %
HCT: 31.9 % — ABNORMAL LOW (ref 39.0–52.0)
Hemoglobin: 10.3 g/dL — ABNORMAL LOW (ref 13.0–17.0)
Immature Granulocytes: 0 %
Lymphocytes Relative: 24 %
Lymphs Abs: 2.5 10*3/uL (ref 0.7–4.0)
MCH: 26.5 pg (ref 26.0–34.0)
MCHC: 32.3 g/dL (ref 30.0–36.0)
MCV: 82 fL (ref 80.0–100.0)
Monocytes Absolute: 0.8 10*3/uL (ref 0.1–1.0)
Monocytes Relative: 8 %
Neutro Abs: 6.8 10*3/uL (ref 1.7–7.7)
Neutrophils Relative %: 68 %
Platelet Count: 278 10*3/uL (ref 150–400)
RBC: 3.89 MIL/uL — ABNORMAL LOW (ref 4.22–5.81)
RDW: 16.9 % — ABNORMAL HIGH (ref 11.5–15.5)
WBC Count: 10.2 10*3/uL (ref 4.0–10.5)
nRBC: 0 % (ref 0.0–0.2)

## 2018-10-01 LAB — CMP (CANCER CENTER ONLY)
ALT: 14 U/L (ref 0–44)
AST: 10 U/L — ABNORMAL LOW (ref 15–41)
Albumin: 3.3 g/dL — ABNORMAL LOW (ref 3.5–5.0)
Alkaline Phosphatase: 60 U/L (ref 38–126)
Anion gap: 9 (ref 5–15)
BUN: 8 mg/dL (ref 6–20)
CO2: 23 mmol/L (ref 22–32)
Calcium: 8.5 mg/dL — ABNORMAL LOW (ref 8.9–10.3)
Chloride: 106 mmol/L (ref 98–111)
Creatinine: 0.77 mg/dL (ref 0.61–1.24)
GFR, Est AFR Am: >60 mL/min (ref >60)
GFR, Estimated: >60 mL/min (ref >60)
Glucose, Bld: 98 mg/dL (ref 70–99)
Potassium: 3.4 mmol/L — ABNORMAL LOW (ref 3.5–5.1)
Sodium: 138 mmol/L (ref 135–145)
Total Bilirubin: <0.2 mg/dL — ABNORMAL LOW (ref 0.3–1.2)
Total Protein: 6.9 g/dL (ref 6.5–8.1)

## 2018-10-01 MED ORDER — GABAPENTIN 600 MG PO TABS: 600.0000 mg | ORAL_TABLET | Freq: Three times a day (TID) | ORAL | 3 refills | Status: DC

## 2018-10-01 MED ORDER — SODIUM CHLORIDE 0.9% FLUSH
10.0000 mL | Freq: Once | INTRAVENOUS | Status: AC
  Administered 2018-10-01: 10 mL
  Filled 2018-10-01: qty 10

## 2018-10-01 MED ORDER — DULOXETINE HCL 30 MG PO CPEP: 30.0000 mg | ORAL_CAPSULE | Freq: Every day | ORAL | 5 refills | Status: DC

## 2018-10-01 MED ORDER — OXYCODONE HCL 15 MG PO TABS: 15.0000 mg | ORAL_TABLET | ORAL | 0 refills | Status: DC | PRN

## 2018-10-01 MED ORDER — HEPARIN SOD (PORK) LOCK FLUSH 100 UNIT/ML IV SOLN
250.0000 [IU] | Freq: Once | INTRAVENOUS | Status: DC
  Filled 2018-10-01: qty 5

## 2018-10-01 MED ORDER — HEPARIN SOD (PORK) LOCK FLUSH 100 UNIT/ML IV SOLN
500.0000 [IU] | Freq: Once | INTRAVENOUS | Status: AC
  Administered 2018-10-01: 500 [IU]
  Filled 2018-10-01: qty 5

## 2018-10-01 NOTE — Progress Notes (Signed)
Hematology and Oncology Follow Up Visit  Eric Mason [id number] 21-Jul-1970 48 y.o. 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  He was found to have brain metastasis at that time..  Prior Therapy:   Status post craniotomy and resection in May 2018. This was followed by [facility] completed in June 2018.  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  Carboplatin and paclitaxel with Pembrolizumab cycle 1 given on 06/18/2017.  He completed 4 cycles of carboplatin and paclitaxel and completed 6 cycles of Pembrolizumab total.  He is status post repeat radiation to L5 presumed tumor ablated in June 2020.  Current therapy: Active surveillance.  Interim History: Eric Mason is here for a follow-up.  Since the last visit, he had a brief hospitalization last week due to symptoms of nausea and vomiting and presumed gastrointestinal illness.  Since his discharge, he reports feeling better and started to eat more and hydrating more properly.  He continues to have chronic back pain issues despite his recent radiation.  He had limited improvement with interventional injections previously.  Continues to use oxycodone every 4 hours.  He still ambulating without any difficulties at this time.  He denies any recent falls or syncope.  His appetite although low his weight remained relatively stable.  He denied headaches, blurry vision, syncope or seizures.  Denies any fevers, chills or sweats.  Denied chest pain, palpitation, orthopnea or leg edema.  Denied cough, wheezing or hemoptysis.  Denied nausea, vomiting or abdominal pain.  Denies any constipation or diarrhea.  Denies any frequency urgency or hesitancy.  Denies any arthralgias or myalgias.   Denies any skin rashes or lesions.  Denies any bleeding or clotting tendency.  Denies any easy bruising.  Denies any hair or nail changes.  Denies any anxiety or depression.  Remaining review of system is negative.     Medications: Updated today with review. Current Outpatient Medications  Medication Sig Dispense Refill  . albuterol (PROVENTIL HFA;VENTOLIN HFA) 108 (90 Base) MCG/ACT inhaler Inhale 2 puffs into the lungs every 6 (six) hours as needed for wheezing or shortness of breath. 1 Inhaler 6  . amitriptyline (ELAVIL) 75 MG tablet Take 1 tablet (75 mg total) by mouth at bedtime. 30 tablet 3  . B Complex-C (B-COMPLEX WITH VITAMIN C) tablet Take 1 tablet by mouth daily.    . bisacodyl (DULCOLAX) 5 MG EC tablet Take 2 tablets (10 mg total) by mouth daily as needed for moderate constipation. 30 tablet 0  . Calcium Carb-Cholecalciferol (CALCIUM 500 +D) 500-400 MG-UNIT TABS 1 tab PO BID 60 tablet 2  . dexamethasone (DECADRON) 4 MG tablet Take 1 tablet (4 mg total) by mouth 2 (two) times daily with a meal. 60 tablet 0  . DULoxetine (CYMBALTA) 30 MG capsule Take 1 capsule (30 mg total) by mouth daily. (Patient taking differently: Take 30 mg by mouth at bedtime. ) 30 capsule 5  . folic acid (FOLVITE) 1 MG tablet Take 1 tablet (1 mg total) by mouth daily. 100 tablet 1  . gabapentin (NEURONTIN) 600 MG tablet Take 1 tablet (600 mg total) by mouth 3 (three) times daily. 90 tablet 3  . levETIRAcetam (KEPPRA) 1000 MG tablet Take 2 tablets (2,000 mg total) by mouth 2 (two) times daily. 120 tablet 2  .  lidocaine-prilocaine (EMLA) cream Apply 1 application topically as needed. 30 g 0  . LORazepam (ATIVAN) 1 MG tablet Take 2 tablets (2 mg total) by mouth every 8 (eight) hours as needed for seizure (emergency med for focal seizure). 20 tablet 0  . Multiple Vitamin (MULTIVITAMIN) tablet Take 1 tablet by mouth daily.    . nicotine (NICODERM CQ - DOSED IN MG/24 HOURS) 21 mg/24hr patch Place 1 patch (21 mg total)  onto the skin daily. (Patient not taking: Reported on 09/24/2018) 28 patch 3  . nystatin (MYCOSTATIN) 100000 UNIT/ML suspension Take 5 mLs (500,000 Units total) by mouth 4 (four) times daily. 60 mL 3  . oxyCODONE (ROXICODONE) 15 MG immediate release tablet Take 1 tablet (15 mg total) by mouth every 4 (four) hours as needed for pain. 126 tablet 0  . pantoprazole (PROTONIX) 40 MG tablet Take 1 tablet (40 mg total) by mouth 2 (two) times daily. 60 tablet 0  . polyethylene glycol (MIRALAX / GLYCOLAX) 17 g packet Take 17 g by mouth daily as needed. 14 each 0  . promethazine (PHENERGAN) 25 MG tablet Take 1 tablet (25 mg total) by mouth every 6 (six) hours as needed for nausea or vomiting. 30 tablet 0  . senna-docusate (SENOKOT-S) 8.6-50 MG tablet Take 2 tablets by mouth 2 (two) times daily. 30 tablet 1  . thiamine (VITAMIN B-1) 100 MG tablet Take 1 tablet (100 mg total) by mouth daily. 100 tablet 0   No current facility-administered medications for this visit.    Facility-Administered Medications Ordered in Other Visits  Medication Dose Route Frequency Provider Last Rate Last Dose  . heparin lock flush 100 unit/mL  250 Units Intracatheter Once Wyatt Portela, MD      . sodium chloride flush (NS) 0.9 % injection 10 mL  10 mL Intracatheter PRN Wyatt Portela, MD   10 mL at 06/18/17 1815     Allergies:  Allergies  Allergen Reactions  . Latex Itching and Rash    Past Medical History, Surgical history, Social history, and Family History remains unchanged by review.  Physical Exam:  Blood pressure 140/80, pulse 84, temperature 98.6 F (37 C), temperature source Oral, resp. rate 18, height 5\' 10"  (1.778 m), weight 160 lb 14.4 oz (73 kg), SpO2 100 %.   ECOG: 1     General appearance: Comfortable appearing without any discomfort Head: Normocephalic without any trauma Oropharynx: Mucous membranes are moist and pink without any thrush or ulcers. Eyes: Pupils are equal and round reactive to  light. Lymph nodes: No cervical, supraclavicular, inguinal or axillary lymphadenopathy.   Heart:regular rate and rhythm.  S1 and S2 without leg edema. Lung: Clear without any rhonchi or wheezes.  No dullness to percussion. Abdomin: Soft, nontender, nondistended with good bowel sounds.  No hepatosplenomegaly. Musculoskeletal: No joint deformity or effusion.  Full range of motion noted. Neurological: No deficits noted on motor, sensory and deep tendon reflex exam. Skin: No petechial rash or dryness.  Appeared moist.        Lab Results: Lab Results  Component Value Date   WBC 9.8 09/25/2018   HGB 12.9 (L) 09/25/2018   HCT 40.2 09/25/2018   MCV 84.1 09/25/2018   PLT 400 09/25/2018     Chemistry      Component Value Date/Time   NA 138 09/26/2018 0433   NA 137 10/30/2016 0909   K 4.1 09/26/2018 0433   K 3.3 (L) 10/30/2016 0909   CL 107 09/26/2018 0433  CO2 25 09/26/2018 0433   CO2 27 10/30/2016 0909   BUN 12 09/26/2018 0433   BUN 4.8 (L) 10/30/2016 0909   CREATININE 0.82 09/26/2018 0433   CREATININE 0.92 04/08/2018 1006   CREATININE 0.9 10/30/2016 0909      Component Value Date/Time   CALCIUM 8.8 (L) 09/26/2018 0433   CALCIUM 9.3 10/30/2016 0909   ALKPHOS 79 09/24/2018 1118   ALKPHOS 93 10/30/2016 0909   AST 19 09/24/2018 1118   AST 14 (L) 04/08/2018 1006   AST 35 (H) 10/30/2016 0909   ALT 12 09/24/2018 1118   ALT 10 04/08/2018 1006   ALT 25 10/30/2016 0909   BILITOT 0.5 09/24/2018 1118   BILITOT 0.4 04/08/2018 1006   BILITOT 0.41 10/30/2016 0909      IMPRESSION: 1. Stable exam.  No new or progressive interval findings. 2. Tiny bilateral pulmonary nodules, stable in the interval. 3.  Emphysema. (ICD10-J43.9) 4.  Aortic Atherosclerois (ICD10-170.0)     Impression and Plan:  48 year old man with:  1.  Stage IV carcinoma with a presumed lung primary diagnosed in May 2018.  He presented with CNS metastasis and questionable pulmonary nodules.  He remains  on active surveillance at this time without any evidence of relapsed disease.  Imaging studies obtained on Jul 15, 2018 were reviewed personally and showed no indication for systemic relapse or any need for any additional systemic therapy.  I recommended continue monitoring at this time.  We will repeat imaging studies in 4 months.  Different salvage therapy including systemic therapy, immunotherapy are all potential options to treat his disease.  Repeating tissue biopsy may be needed at that time for documentation of the etiology of his cancer as well as molecular profiling.   2.  CNS and spinal metastasis: Status post radiation therapy without any recent relapse.  MRI of the spine and brain completed in April and May 2020.  3.  IV access: Port-A-Cath remains in place and will be flushed periodically.  4.  Pain: Related to his malignancy and metastatic disease.  5.  Anorexia: Improving at this time.  6.  Prognosis and goals of care: Therapy remains palliative at this time although he had excellent response to therapy without any evidence of relapse and aggressive measures are warranted.  7. Follow-up: Will be in 4  months for repeat imaging studies.  25 minutes was spent with the patient face-to-face today.  More than 50% of time was spent on reviewing his disease status, reviewing imaging studies, salvage therapy options answering questions regarding future plan of care.    Zola Button, MD 7/22/20209:19 AM

## 2018-10-02 ENCOUNTER — Telehealth: Payer: Self-pay | Admitting: Radiation Therapy

## 2018-10-02 ENCOUNTER — Telehealth: Payer: Self-pay | Admitting: Oncology

## 2018-10-02 NOTE — Telephone Encounter (Signed)
Spoke with Eric Mason about tapering down from his steroids. He has been taking 4mg  BID since 7/18. Bryson Ha has instructed the following:   Starting on 7/25 - take 2mg  BID for 1 week Starting on 8/1 - take 2mg  once per day for 1 week  Starting on 8/8 - take 2mg  once every other day Stop on 8/18  Eric Mason said that he understands these instructions and will call if he has any further questions.   Mont Dutton R.T.(R)(T) Special Procedures Navigator

## 2018-10-02 NOTE — Telephone Encounter (Signed)
Called and left msg. Mailed printout  °

## 2018-10-03 ENCOUNTER — Telehealth: Payer: Self-pay | Admitting: Radiation Therapy

## 2018-10-03 NOTE — Telephone Encounter (Signed)
Spoke with Eric Mason to inform him that the Nystatin he requested to treat his thrush has been ordered and should be ready soon for pick up from his pharmacy. He was very thankful for the call.   Mont Dutton R.T.(R)(T) Special Procedures Navigator

## 2018-10-08 ENCOUNTER — Other Ambulatory Visit: Payer: Self-pay | Admitting: Radiation Therapy

## 2018-10-09 ENCOUNTER — Ambulatory Visit
Admission: RE | Admit: 2018-10-09 | Discharge: 2018-10-09 | Disposition: A | Discharge status: Home / Self Care | Payer: Medicaid Other | Source: Ambulatory Visit | Attending: Internal Medicine | Admitting: Internal Medicine

## 2018-10-09 DIAGNOSIS — C7931 Secondary malignant neoplasm of brain: Secondary | ICD-10-CM

## 2018-10-09 MED ORDER — GADOBENATE DIMEGLUMINE 529 MG/ML IV SOLN
15.0000 mL | Freq: Once | INTRAVENOUS | Status: AC | PRN
  Administered 2018-10-09: 15 mL via INTRAVENOUS

## 2018-10-14 ENCOUNTER — Other Ambulatory Visit: Payer: Self-pay

## 2018-10-14 ENCOUNTER — Inpatient Hospital Stay: Payer: Medicaid Other | Attending: Internal Medicine | Admitting: Internal Medicine

## 2018-10-14 ENCOUNTER — Telehealth: Payer: Self-pay | Admitting: Internal Medicine

## 2018-10-14 VITALS — BP 139/89 | HR 85 | Temp 98.9°F | Resp 18 | Ht 70.0 in | Wt 166.3 lb

## 2018-10-14 DIAGNOSIS — D492 Neoplasm of unspecified behavior of bone, soft tissue, and skin: Secondary | ICD-10-CM

## 2018-10-14 DIAGNOSIS — R569 Unspecified convulsions: Secondary | ICD-10-CM | POA: Insufficient documentation

## 2018-10-14 DIAGNOSIS — Z79899 Other long term (current) drug therapy: Secondary | ICD-10-CM | POA: Insufficient documentation

## 2018-10-14 DIAGNOSIS — C7931 Secondary malignant neoplasm of brain: Secondary | ICD-10-CM | POA: Diagnosis present

## 2018-10-14 DIAGNOSIS — C801 Malignant (primary) neoplasm, unspecified: Secondary | ICD-10-CM | POA: Insufficient documentation

## 2018-10-14 MED ORDER — LORAZEPAM 1 MG PO TABS: 2.0000 mg | ORAL_TABLET | Freq: Three times a day (TID) | ORAL | 0 refills | Status: DC | PRN

## 2018-10-14 MED ORDER — NYSTATIN 100000 UNIT/ML MT SUSP: 5.0000 mL | Freq: Four times a day (QID) | OROMUCOSAL | 3 refills | Status: DC

## 2018-10-14 NOTE — Telephone Encounter (Signed)
Scheduled appt per 8/4 los.  Spoke with patient spouse and she is aware of the appt date and time.
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
## 2018-10-15 ENCOUNTER — Telehealth: Payer: Self-pay

## 2018-10-15 NOTE — Telephone Encounter (Signed)
Received call from [facility] rad-onc stating that the patient has left a follow up message with her requesting the Oxycodone refill. Contacted patient spouse [name] and informed her of the message received by Manuela Schwartz. [name] stated that she is concerned that since the pharmacy has limited hours of operation this Saturday that the patient will run out of pain medication. Explained that the patient current prescription should last until 8/12. Explained that if refill is sent on 8/10 she can pick up prior to 8/12 and the patient should not run out. She verbalized understanding and had no other questions or concerns.

## 2018-10-15 NOTE — Telephone Encounter (Signed)
Received call from patient spouse [name] requesting refill of Oxycodone. Explained that the refill can be sent on 8/10 but today is too early. [name] verbalized understanding and will follow up with the pharmacy after 8/10. No other questions or concerns.

## 2018-10-20 ENCOUNTER — Other Ambulatory Visit: Payer: Self-pay | Admitting: Oncology

## 2018-10-20 DIAGNOSIS — C3490 Malignant neoplasm of unspecified part of unspecified bronchus or lung: Secondary | ICD-10-CM

## 2018-10-20 DIAGNOSIS — C763 Malignant neoplasm of pelvis: Secondary | ICD-10-CM

## 2018-10-20 DIAGNOSIS — M79604 Pain in right leg: Secondary | ICD-10-CM

## 2018-10-20 DIAGNOSIS — C7931 Secondary malignant neoplasm of brain: Secondary | ICD-10-CM

## 2018-10-20 MED ORDER — OXYCODONE HCL 15 MG PO TABS: 15.0000 mg | ORAL_TABLET | ORAL | 0 refills | Status: DC | PRN

## 2018-10-24 DIAGNOSIS — C7951 Secondary malignant neoplasm of bone: Secondary | ICD-10-CM | POA: Insufficient documentation

## 2018-10-24 NOTE — Progress Notes (Signed)
  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  Identity was confirmed.  All relevant records and images related to the planned course of therapy were reviewed.   Written consent to proceed with treatment was confirmed which was freely given after reviewing the details related to the planned course of therapy had been reviewed with the patient.  Then, the patient was set-up in a stable reproducible supine position for radiation therapy.  CT images were obtained.  Surface markings were placed.    Medically necessary complex treatment device(s) for immobilization:  1.  customized body-fix device, 2.  customized accuform device.   The CT images were loaded into the planning software.  Then the target and avoidance structures were contoured.  Treatment planning then occurred.  The radiation prescription was entered and confirmed.   The patient will receive a course of stereotactic radiosurgery to the spine. I am therefore requesting a 3-D conformal technique. The target volume has been contoured in addition to the spinal cord and surrounding critical at risk organs.  Dose volume histograms of each of these structures will be carefully reviewed as part of the 3-D conformal technique.   PLAN:  The patient will receive 18 Gy in 1 fraction(s).  ________________________________   Jodelle Gross, MD, PhD

## 2018-10-24 NOTE — Progress Notes (Signed)
  Radiation Oncology         [phone]) [phone] ________________________________  Name: Juriel Cid MRN: [id number]  Date: 08/08/2018  DOB: Feb 11, 1971   SPECIAL TREATMENT PROCEDURE   3D TREATMENT PLANNING AND DOSIMETRY: The patient's radiation plan was reviewed and approved by Dr. Saintclair Halsted from neurosurgery and radiation oncology prior to treatment. It showed 3-dimensional radiation distributions overlaid onto the planning CT/MRI image set. The [facility] for the target structures as well as the organs at risk were reviewed. The documentation of the 3D plan and dosimetry are filed in the radiation oncology EMR.   NARRATIVE: The patient was brought to the TrueBeam stereotactic radiation treatment machine and placed supine on the CT couch. The patient was placed in the BodyFix bag to set up the patient for stereotactic radiosurgery and vacuum immobilization was applied. Neurosurgery was present for the set-up and delivery   SIMULATION VERIFICATION: In the couch zero-angle position, the patient underwent Exactrac imaging using the Brainlab system with orthogonal KV images. These were carefully aligned and repeated to confirm treatment position for each of the isocenters. The Exactrac snap film verification was repeated at each couch angle.   SPECIAL TREATMENT PROCEDURE: The patient received stereotactic radiosurgery to the following targets:  PTV target was treated using 3 Arcs to a prescription dose of 18 Gy. ExacTrac Snap verification was performed for each couch angle.   STEREOTACTIC TREATMENT MANAGEMENT: Following delivery, the patient was transported to nursing in stable condition and monitored for possible acute effects. Vital signs were recorded . The patient tolerated treatment without significant acute effects, and was discharged to home in stable condition.  PLAN: Follow-up in one month.   ------------------------------------------------  Jodelle Gross, MD, PhD

## 2018-10-30 ENCOUNTER — Other Ambulatory Visit: Payer: Self-pay | Admitting: Oncology

## 2018-10-30 DIAGNOSIS — C7931 Secondary malignant neoplasm of brain: Secondary | ICD-10-CM

## 2018-10-30 DIAGNOSIS — M79604 Pain in right leg: Secondary | ICD-10-CM

## 2018-10-30 DIAGNOSIS — C3490 Malignant neoplasm of unspecified part of unspecified bronchus or lung: Secondary | ICD-10-CM

## 2018-10-30 DIAGNOSIS — C763 Malignant neoplasm of pelvis: Secondary | ICD-10-CM

## 2018-11-03 ENCOUNTER — Telehealth: Payer: Self-pay

## 2018-11-03 NOTE — Telephone Encounter (Signed)
Received call from pt spouse requesting medication refill for the Oxycodone. Called back and made aware that refill was sent in on 8/20. [name] verbalized understanding.

## 2018-11-21 ENCOUNTER — Telehealth: Payer: Self-pay | Admitting: *Deleted

## 2018-11-21 NOTE — Telephone Encounter (Signed)
-----   Message from Wyatt Portela, MD sent at 11/21/2018 10:19 AM EDT ----- PCP. He is not on any treatment from my stand point. Thanks.  ----- Message ----- From: Patton Salles, RN Sent: 11/21/2018  10:02 AM EDT To: Wyatt Portela, MD  [facility] navigator spoke with Mr K[facility]le's wife. States he is having pain and swelling in legs. She tried to check for pitting, but it hurt too much to push on legs. Do you want me to have them call PCP? Or come in to see Lucianne Lei?Thanks,Haisley Arens

## 2018-11-21 NOTE — Telephone Encounter (Signed)
Notified of message below to see PCP

## 2018-11-24 ENCOUNTER — Other Ambulatory Visit: Payer: Self-pay | Admitting: Oncology

## 2018-11-24 DIAGNOSIS — C3490 Malignant neoplasm of unspecified part of unspecified bronchus or lung: Secondary | ICD-10-CM

## 2018-11-24 DIAGNOSIS — M79604 Pain in right leg: Secondary | ICD-10-CM

## 2018-11-24 DIAGNOSIS — C7931 Secondary malignant neoplasm of brain: Secondary | ICD-10-CM

## 2018-11-24 DIAGNOSIS — C763 Malignant neoplasm of pelvis: Secondary | ICD-10-CM

## 2018-11-24 MED ORDER — OXYCODONE HCL 15 MG PO TABS: 15.0000 mg | ORAL_TABLET | ORAL | 0 refills | Status: DC | PRN

## 2018-11-25 ENCOUNTER — Other Ambulatory Visit: Payer: Self-pay | Admitting: Oncology

## 2018-11-25 DIAGNOSIS — C763 Malignant neoplasm of pelvis: Secondary | ICD-10-CM

## 2018-11-25 DIAGNOSIS — C7931 Secondary malignant neoplasm of brain: Secondary | ICD-10-CM

## 2018-11-25 DIAGNOSIS — C3490 Malignant neoplasm of unspecified part of unspecified bronchus or lung: Secondary | ICD-10-CM

## 2018-11-25 DIAGNOSIS — M79604 Pain in right leg: Secondary | ICD-10-CM

## 2018-12-02 ENCOUNTER — Inpatient Hospital Stay: Payer: Medicaid Other

## 2018-12-02 ENCOUNTER — Other Ambulatory Visit: Payer: Self-pay

## 2018-12-02 ENCOUNTER — Inpatient Hospital Stay: Payer: Medicaid Other | Attending: Internal Medicine

## 2018-12-02 DIAGNOSIS — C7931 Secondary malignant neoplasm of brain: Secondary | ICD-10-CM | POA: Insufficient documentation

## 2018-12-02 DIAGNOSIS — C7951 Secondary malignant neoplasm of bone: Secondary | ICD-10-CM | POA: Diagnosis not present

## 2018-12-02 DIAGNOSIS — C801 Malignant (primary) neoplasm, unspecified: Secondary | ICD-10-CM | POA: Insufficient documentation

## 2018-12-02 DIAGNOSIS — Z452 Encounter for adjustment and management of vascular access device: Secondary | ICD-10-CM | POA: Insufficient documentation

## 2018-12-02 DIAGNOSIS — G893 Neoplasm related pain (acute) (chronic): Secondary | ICD-10-CM | POA: Diagnosis not present

## 2018-12-02 DIAGNOSIS — Z95828 Presence of other vascular implants and grafts: Secondary | ICD-10-CM

## 2018-12-02 MED ORDER — SODIUM CHLORIDE 0.9% FLUSH
10.0000 mL | Freq: Once | INTRAVENOUS | Status: AC
  Administered 2018-12-02: 10 mL
  Filled 2018-12-02: qty 10

## 2018-12-02 MED ORDER — HEPARIN SOD (PORK) LOCK FLUSH 100 UNIT/ML IV SOLN
250.0000 [IU] | Freq: Once | INTRAVENOUS | Status: AC
  Administered 2018-12-02: 500 [IU]
  Filled 2018-12-02: qty 5

## 2018-12-02 NOTE — Patient Instructions (Signed)

## 2018-12-12 ENCOUNTER — Other Ambulatory Visit: Payer: Self-pay | Admitting: Oncology

## 2018-12-12 DIAGNOSIS — C763 Malignant neoplasm of pelvis: Secondary | ICD-10-CM

## 2018-12-12 DIAGNOSIS — M79604 Pain in right leg: Secondary | ICD-10-CM

## 2018-12-12 DIAGNOSIS — C3490 Malignant neoplasm of unspecified part of unspecified bronchus or lung: Secondary | ICD-10-CM

## 2018-12-12 DIAGNOSIS — C7931 Secondary malignant neoplasm of brain: Secondary | ICD-10-CM

## 2018-12-15 ENCOUNTER — Other Ambulatory Visit: Payer: Self-pay | Admitting: Radiation Therapy

## 2018-12-22 ENCOUNTER — Other Ambulatory Visit: Payer: Self-pay | Admitting: *Deleted

## 2018-12-22 MED ORDER — LEVETIRACETAM 1000 MG PO TABS: 2000.0000 mg | ORAL_TABLET | Freq: Two times a day (BID) | ORAL | 3 refills | Status: DC

## 2018-12-25 ENCOUNTER — Other Ambulatory Visit: Payer: Self-pay | Admitting: Oncology

## 2018-12-25 DIAGNOSIS — R112 Nausea with vomiting, unspecified: Secondary | ICD-10-CM

## 2018-12-29 ENCOUNTER — Other Ambulatory Visit: Payer: Self-pay | Admitting: Oncology

## 2018-12-29 DIAGNOSIS — C3490 Malignant neoplasm of unspecified part of unspecified bronchus or lung: Secondary | ICD-10-CM

## 2018-12-29 DIAGNOSIS — C7931 Secondary malignant neoplasm of brain: Secondary | ICD-10-CM

## 2018-12-29 DIAGNOSIS — M79604 Pain in right leg: Secondary | ICD-10-CM

## 2018-12-29 DIAGNOSIS — C763 Malignant neoplasm of pelvis: Secondary | ICD-10-CM

## 2018-12-29 MED ORDER — OXYCODONE HCL 15 MG PO TABS: 15.0000 mg | ORAL_TABLET | ORAL | 0 refills | Status: DC | PRN

## 2018-12-31 ENCOUNTER — Ambulatory Visit
Admission: RE | Admit: 2018-12-31 | Discharge: 2018-12-31 | Disposition: A | Discharge status: Home / Self Care | Payer: Medicaid Other | Source: Ambulatory Visit | Attending: Internal Medicine | Admitting: Internal Medicine

## 2018-12-31 DIAGNOSIS — Z01818 Encounter for other preprocedural examination: Secondary | ICD-10-CM | POA: Diagnosis not present

## 2018-12-31 DIAGNOSIS — C7931 Secondary malignant neoplasm of brain: Secondary | ICD-10-CM

## 2018-12-31 DIAGNOSIS — G936 Cerebral edema: Secondary | ICD-10-CM | POA: Diagnosis not present

## 2018-12-31 DIAGNOSIS — J3489 Other specified disorders of nose and nasal sinuses: Secondary | ICD-10-CM | POA: Diagnosis not present

## 2018-12-31 DIAGNOSIS — G9389 Other specified disorders of brain: Secondary | ICD-10-CM | POA: Diagnosis not present

## 2018-12-31 DIAGNOSIS — J320 Chronic maxillary sinusitis: Secondary | ICD-10-CM | POA: Diagnosis not present

## 2018-12-31 MED ORDER — GADOBENATE DIMEGLUMINE 529 MG/ML IV SOLN
13.0000 mL | Freq: Once | INTRAVENOUS | Status: AC | PRN
  Administered 2018-12-31: 13 mL via INTRAVENOUS

## 2019-01-16 ENCOUNTER — Other Ambulatory Visit: Payer: Self-pay | Admitting: Oncology

## 2019-01-16 DIAGNOSIS — C3490 Malignant neoplasm of unspecified part of unspecified bronchus or lung: Secondary | ICD-10-CM

## 2019-01-16 DIAGNOSIS — M79604 Pain in right leg: Secondary | ICD-10-CM

## 2019-01-16 DIAGNOSIS — C763 Malignant neoplasm of pelvis: Secondary | ICD-10-CM

## 2019-01-16 DIAGNOSIS — M79605 Pain in left leg: Secondary | ICD-10-CM

## 2019-01-16 DIAGNOSIS — C7931 Secondary malignant neoplasm of brain: Secondary | ICD-10-CM

## 2019-01-16 MED ORDER — OXYCODONE HCL 15 MG PO TABS: 15.0000 mg | ORAL_TABLET | ORAL | 0 refills | Status: DC | PRN

## 2019-01-20 ENCOUNTER — Other Ambulatory Visit: Payer: Self-pay

## 2019-01-20 ENCOUNTER — Other Ambulatory Visit: Payer: Self-pay | Admitting: Oncology

## 2019-01-20 ENCOUNTER — Telehealth: Payer: Self-pay | Admitting: Internal Medicine

## 2019-01-20 ENCOUNTER — Inpatient Hospital Stay: Payer: Medicaid Other | Attending: Internal Medicine | Admitting: Internal Medicine

## 2019-01-20 VITALS — BP 112/76 | HR 76 | Temp 98.3°F | Resp 18 | Ht 70.0 in | Wt 153.9 lb

## 2019-01-20 DIAGNOSIS — Z79899 Other long term (current) drug therapy: Secondary | ICD-10-CM | POA: Diagnosis not present

## 2019-01-20 DIAGNOSIS — C7931 Secondary malignant neoplasm of brain: Secondary | ICD-10-CM | POA: Diagnosis present

## 2019-01-20 DIAGNOSIS — C7951 Secondary malignant neoplasm of bone: Secondary | ICD-10-CM | POA: Diagnosis not present

## 2019-01-20 DIAGNOSIS — G893 Neoplasm related pain (acute) (chronic): Secondary | ICD-10-CM | POA: Diagnosis not present

## 2019-01-20 DIAGNOSIS — R569 Unspecified convulsions: Secondary | ICD-10-CM | POA: Diagnosis not present

## 2019-01-20 DIAGNOSIS — C801 Malignant (primary) neoplasm, unspecified: Secondary | ICD-10-CM | POA: Diagnosis not present

## 2019-01-20 MED ORDER — PROMETHAZINE HCL 25 MG PO TABS: 25.0000 mg | ORAL_TABLET | Freq: Four times a day (QID) | ORAL | 1 refills | Status: DC | PRN

## 2019-01-20 MED ORDER — DEXAMETHASONE 2 MG PO TABS: 2.0000 mg | ORAL_TABLET | Freq: Two times a day (BID) | ORAL | 0 refills | Status: DC

## 2019-01-20 MED ORDER — AMITRIPTYLINE HCL 75 MG PO TABS: 75.0000 mg | ORAL_TABLET | Freq: Every day | ORAL | 3 refills | Status: DC

## 2019-01-20 NOTE — Telephone Encounter (Signed)
Gave avs and calendar ° °
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
## 2019-01-21 ENCOUNTER — Other Ambulatory Visit: Payer: Self-pay | Admitting: Radiation Therapy

## 2019-01-26 ENCOUNTER — Other Ambulatory Visit: Payer: Self-pay | Admitting: Internal Medicine

## 2019-01-26 DIAGNOSIS — C7931 Secondary malignant neoplasm of brain: Secondary | ICD-10-CM

## 2019-02-02 ENCOUNTER — Other Ambulatory Visit: Payer: Self-pay | Admitting: Oncology

## 2019-02-02 DIAGNOSIS — C763 Malignant neoplasm of pelvis: Secondary | ICD-10-CM

## 2019-02-02 DIAGNOSIS — C3490 Malignant neoplasm of unspecified part of unspecified bronchus or lung: Secondary | ICD-10-CM

## 2019-02-02 DIAGNOSIS — M79605 Pain in left leg: Secondary | ICD-10-CM

## 2019-02-02 DIAGNOSIS — M79604 Pain in right leg: Secondary | ICD-10-CM

## 2019-02-02 DIAGNOSIS — C7931 Secondary malignant neoplasm of brain: Secondary | ICD-10-CM

## 2019-02-02 MED ORDER — OXYCODONE HCL 15 MG PO TABS: 15.0000 mg | ORAL_TABLET | ORAL | 0 refills | Status: DC | PRN

## 2019-02-03 ENCOUNTER — Other Ambulatory Visit: Payer: Self-pay

## 2019-02-03 ENCOUNTER — Inpatient Hospital Stay: Payer: Medicaid Other

## 2019-02-03 ENCOUNTER — Ambulatory Visit (HOSPITAL_COMMUNITY)
Admission: RE | Admit: 2019-02-03 | Discharge: 2019-02-03 | Disposition: A | Discharge status: Home / Self Care | Payer: Medicaid Other | Source: Ambulatory Visit | Attending: Oncology | Admitting: Oncology

## 2019-02-03 ENCOUNTER — Encounter (HOSPITAL_COMMUNITY): Payer: Self-pay

## 2019-02-03 ENCOUNTER — Telehealth: Payer: Self-pay | Admitting: Oncology

## 2019-02-03 DIAGNOSIS — Z95828 Presence of other vascular implants and grafts: Secondary | ICD-10-CM

## 2019-02-03 DIAGNOSIS — C3490 Malignant neoplasm of unspecified part of unspecified bronchus or lung: Secondary | ICD-10-CM | POA: Insufficient documentation

## 2019-02-03 DIAGNOSIS — C7931 Secondary malignant neoplasm of brain: Secondary | ICD-10-CM

## 2019-02-03 HISTORY — DX: Secondary malignant neoplasm of bone: C79.51

## 2019-02-03 HISTORY — DX: Secondary malignant neoplasm of brain: C79.31

## 2019-02-03 LAB — CBC WITH DIFFERENTIAL (CANCER CENTER ONLY)
Abs Immature Granulocytes: 0.03 10*3/uL (ref 0.00–0.07)
Basophils Absolute: 0 10*3/uL (ref 0.0–0.1)
Basophils Relative: 0 %
Eosinophils Absolute: 0.1 10*3/uL (ref 0.0–0.5)
Eosinophils Relative: 1 %
HCT: 34.3 % — ABNORMAL LOW (ref 39.0–52.0)
Hemoglobin: 11.4 g/dL — ABNORMAL LOW (ref 13.0–17.0)
Immature Granulocytes: 0 %
Lymphocytes Relative: 32 %
Lymphs Abs: 2.9 10*3/uL (ref 0.7–4.0)
MCH: 25.7 pg — ABNORMAL LOW (ref 26.0–34.0)
MCHC: 33.2 g/dL (ref 30.0–36.0)
MCV: 77.4 fL — ABNORMAL LOW (ref 80.0–100.0)
Monocytes Absolute: 0.7 10*3/uL (ref 0.1–1.0)
Monocytes Relative: 7 %
Neutro Abs: 5.5 10*3/uL (ref 1.7–7.7)
Neutrophils Relative %: 60 %
Platelet Count: 370 10*3/uL (ref 150–400)
RBC: 4.43 MIL/uL (ref 4.22–5.81)
RDW: 18.6 % — ABNORMAL HIGH (ref 11.5–15.5)
WBC Count: 9.1 10*3/uL (ref 4.0–10.5)
nRBC: 0 % (ref 0.0–0.2)

## 2019-02-03 LAB — CMP (CANCER CENTER ONLY)
ALT: 11 U/L (ref 0–44)
AST: 14 U/L — ABNORMAL LOW (ref 15–41)
Albumin: 4 g/dL (ref 3.5–5.0)
Alkaline Phosphatase: 95 U/L (ref 38–126)
Anion gap: 8 (ref 5–15)
BUN: 14 mg/dL (ref 6–20)
CO2: 26 mmol/L (ref 22–32)
Calcium: 9.1 mg/dL (ref 8.9–10.3)
Chloride: 102 mmol/L (ref 98–111)
Creatinine: 0.83 mg/dL (ref 0.61–1.24)
GFR, Est AFR Am: >60 mL/min (ref >60)
GFR, Estimated: >60 mL/min (ref >60)
Glucose, Bld: 99 mg/dL (ref 70–99)
Potassium: 3.7 mmol/L (ref 3.5–5.1)
Sodium: 136 mmol/L (ref 135–145)
Total Bilirubin: 0.4 mg/dL (ref 0.3–1.2)
Total Protein: 8 g/dL (ref 6.5–8.1)

## 2019-02-03 MED ORDER — SODIUM CHLORIDE 0.9% FLUSH
10.0000 mL | Freq: Once | INTRAVENOUS | Status: AC
  Administered 2019-02-03: 10 mL
  Filled 2019-02-03: qty 10

## 2019-02-03 MED ORDER — IOHEXOL 300 MG/ML  SOLN
100.0000 mL | Freq: Once | INTRAMUSCULAR | Status: AC | PRN
  Administered 2019-02-03: 100 mL via INTRAVENOUS

## 2019-02-03 MED ORDER — HEPARIN SOD (PORK) LOCK FLUSH 100 UNIT/ML IV SOLN
500.0000 [IU] | Freq: Once | INTRAVENOUS | Status: AC
  Administered 2019-02-03: 500 [IU] via INTRAVENOUS

## 2019-02-03 MED ORDER — HEPARIN SOD (PORK) LOCK FLUSH 100 UNIT/ML IV SOLN
INTRAVENOUS | Status: AC
  Filled 2019-02-03: qty 5

## 2019-02-03 MED ORDER — SODIUM CHLORIDE (PF) 0.9 % IJ SOLN
INTRAMUSCULAR | Status: AC
  Filled 2019-02-03: qty 50

## 2019-02-03 NOTE — Telephone Encounter (Signed)
Called patient regarding providers request, patient agreed to do a phone visit for his follow-up appointment on 12/01.

## 2019-02-09 ENCOUNTER — Other Ambulatory Visit: Payer: Self-pay | Admitting: *Deleted

## 2019-02-09 MED ORDER — NYSTATIN 100000 UNIT/ML MT SUSP: 5.0000 mL | Freq: Four times a day (QID) | OROMUCOSAL | 2 refills | Status: DC

## 2019-02-09 NOTE — Telephone Encounter (Signed)
Wife called requesting a refill of Nystatin and a larger bottle so they don't have to go to store as often. Done

## 2019-02-10 ENCOUNTER — Inpatient Hospital Stay: Payer: Medicaid Other | Attending: Internal Medicine | Admitting: Oncology

## 2019-02-10 DIAGNOSIS — C7931 Secondary malignant neoplasm of brain: Secondary | ICD-10-CM | POA: Insufficient documentation

## 2019-02-10 DIAGNOSIS — Z79899 Other long term (current) drug therapy: Secondary | ICD-10-CM | POA: Insufficient documentation

## 2019-02-10 DIAGNOSIS — C801 Malignant (primary) neoplasm, unspecified: Secondary | ICD-10-CM | POA: Insufficient documentation

## 2019-02-10 DIAGNOSIS — C3490 Malignant neoplasm of unspecified part of unspecified bronchus or lung: Secondary | ICD-10-CM | POA: Diagnosis not present

## 2019-02-10 DIAGNOSIS — G893 Neoplasm related pain (acute) (chronic): Secondary | ICD-10-CM | POA: Insufficient documentation

## 2019-02-10 DIAGNOSIS — C7951 Secondary malignant neoplasm of bone: Secondary | ICD-10-CM | POA: Insufficient documentation

## 2019-02-10 DIAGNOSIS — Z923 Personal history of irradiation: Secondary | ICD-10-CM | POA: Insufficient documentation

## 2019-02-10 DIAGNOSIS — Z9221 Personal history of antineoplastic chemotherapy: Secondary | ICD-10-CM | POA: Insufficient documentation

## 2019-02-10 NOTE — Progress Notes (Signed)
Hematology and Oncology Follow Up for Telemedicine Visits  Marquelle Musgrave [id number] Aug 16, 1970 48 y.o. 02/10/2019 2:58 PM [name], [name], [name], MD   I connected with Mr. Essman on 02/10/19 at  3:00 PM EST by telephone visit and verified that I am speaking with the correct person using two identifiers.   I discussed the limitations, risks, security and privacy concerns of performing an evaluation and management service by telemedicine and the availability of in-person appointments. I also discussed with the patient that there may be a patient responsible charge related to this service. The patient expressed understanding and agreed to proceed.  Other persons participating in the visit and their role in the encounter:  None  Patient's location: Home Provider's location: Office    Principle Diagnosis: 48 year old with poorly differentiated cancer originating from a presumed lung primary in May 2018.  He was found to have stage IV disease with brain metastasis.   Prior Therapy:Status post craniotomy and resection in May 2018. This was followed by [facility] completed in June 2018.  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  Carboplatin and paclitaxel with Pembrolizumab cycle 1 given on 06/18/2017.  He completed 4 cycles of carboplatin and paclitaxel and completed 6 cycles of Pembrolizumab total.  He is status post repeat radiation to L5 presumed tumor ablated in June 2020.  Current therapy: Active surveillance.   Interim History: Mr. Guterrez reports no major changes in his health.  He denies any recent complaints at this time.  He continues to have chronic pain related to his previous spinal and brain metastasis but manageable with the current pain regimen.  Denies any recent hospitalization or illnesses.  Denies any neurological deficits.  His  performance status and quality of life remains unchanged.     Medications: I have reviewed the patient's current medications.  Current Outpatient Medications  Medication Sig Dispense Refill  . albuterol (VENTOLIN HFA) 108 (90 Base) MCG/ACT inhaler Inhale 2 puffs into the lungs every 6 (six) hours as needed for wheezing or shortness of breath.(200/8=25) 8.5 g 6  . amitriptyline (ELAVIL) 75 MG tablet Take 1 tablet (75 mg total) by mouth at bedtime. 30 tablet 3  . B Complex-C (B-COMPLEX WITH VITAMIN C) tablet Take 1 tablet by mouth daily.    . bisacodyl (DULCOLAX) 5 MG EC tablet Take 2 tablets (10 mg total) by mouth daily as needed for moderate constipation. 30 tablet 0  . Calcium Carb-Cholecalciferol (CALCIUM 500 +D) 500-400 MG-UNIT TABS 1 tab PO BID 60 tablet 2  . dexamethasone (DECADRON) 2 MG tablet Take 1 tablet (2 mg total) by mouth 2 (two) times daily with a meal. 30 tablet 0  . DULoxetine (CYMBALTA) 30 MG capsule Take 1 capsule (30 mg total) by mouth daily. 30 capsule 5  . folic acid (FOLVITE) 1 MG tablet Take 1 tablet (1 mg total) by mouth daily. 100 tablet 1  . gabapentin (NEURONTIN) 600 MG tablet Take 1 tablet (600 mg total) by mouth 3 (three) times daily. 90 tablet 3  . levETIRAcetam (KEPPRA) 1000 MG tablet Take 2 tablets (2,000 mg total) by mouth 2 (two) times daily. 120 tablet 3  . lidocaine-prilocaine (EMLA) cream Apply 1 application topically as needed. 30 g 0  . LORazepam (ATIVAN) 1 MG tablet Take 2 tablets (2 mg total) by mouth every 8 (eight) hours as needed for seizure (emergency med for focal seizure).  20 tablet 0  . Multiple Vitamin (MULTIVITAMIN) tablet Take 1 tablet by mouth daily.    . nicotine (NICODERM CQ - DOSED IN MG/24 HOURS) 21 mg/24hr patch Place 1 patch (21 mg total) onto the skin daily. (Patient not taking: Reported on 09/24/2018) 28 patch 3  . nystatin (MYCOSTATIN) 100000 UNIT/ML suspension Take 5 mLs (500,000 Units total) by mouth 4 (four) times daily. 120 mL 2   . oxyCODONE (ROXICODONE) 15 MG immediate release tablet Take 1 tablet (15 mg total) by mouth every 4 (four) hours as needed for pain. 126 tablet 0  . pantoprazole (PROTONIX) 40 MG tablet Take 1 tablet(s) by mouth TWO TIMES DAILY 60 tablet 5  . polyethylene glycol (MIRALAX / GLYCOLAX) 17 g packet Take 17 g by mouth daily as needed. 14 each 0  . promethazine (PHENERGAN) 25 MG tablet Take 1 tablet (25 mg total) by mouth every 6 (six) hours as needed for nausea or vomiting. 30 tablet 1  . senna-docusate (SENOKOT-S) 8.6-50 MG tablet Take 2 tablets by mouth 2 (two) times daily. 30 tablet 1  . thiamine (VITAMIN B-1) 100 MG tablet Take 1 tablet (100 mg total) by mouth daily. 100 tablet 0   No current facility-administered medications for this visit.    Facility-Administered Medications Ordered in Other Visits  Medication Dose Route Frequency Provider Last Rate Last Dose  . sodium chloride flush (NS) 0.9 % injection 10 mL  10 mL Intracatheter PRN Wyatt Portela, MD   10 mL at 06/18/17 1815     Allergies:  Allergies  Allergen Reactions  . Latex Itching and Rash    Past Medical History, Surgical history, Social history, and Family History were reviewed and updated.       Lab Results: Lab Results  Component Value Date   WBC 9.1 02/03/2019   HGB 11.4 (L) 02/03/2019   HCT 34.3 (L) 02/03/2019   MCV 77.4 (L) 02/03/2019   PLT 370 02/03/2019     Chemistry      Component Value Date/Time   NA 136 02/03/2019 0839   NA 137 10/30/2016 0909   K 3.7 02/03/2019 0839   K 3.3 (L) 10/30/2016 0909   CL 102 02/03/2019 0839   CO2 26 02/03/2019 0839   CO2 27 10/30/2016 0909   BUN 14 02/03/2019 0839   BUN 4.8 (L) 10/30/2016 0909   CREATININE 0.83 02/03/2019 0839   CREATININE 0.9 10/30/2016 0909      Component Value Date/Time   CALCIUM 9.1 02/03/2019 0839   CALCIUM 9.3 10/30/2016 0909   ALKPHOS 95 02/03/2019 0839   ALKPHOS 93 10/30/2016 0909   AST 14 (L) 02/03/2019 0839   AST 35 (H)  10/30/2016 0909   ALT 11 02/03/2019 0839   ALT 25 10/30/2016 0909   BILITOT 0.4 02/03/2019 0839   BILITOT 0.41 10/30/2016 0909         Impression and Plan:  48 year old man with:  1.    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  He has a documented brain metastasis.  CT scan obtained on 02/03/2019 was personally reviewed and showed no evidence of progression of disease at this time.  The natural course of this disease was reviewed at this time as well as a salvage therapy options were discussed he develops documented metastatic disease.  At this time I recommended active surveillance.  We will repeat imaging studies in 6 months.   2.  CNS and  spinal metastasis: Repeat MRI obtained in October 2020 which showed no clear metastatic disease noted.  3.  IV access: Port-A-Cath will continue to be flushed as needed at this time.  4.  Pain: Related to his malignancy and previous treatments.  He is currently on oxycodone which is managing his pain adequately at this time.   5. Follow-up:  In 6 months for repeat imaging studies.     I discussed the assessment and treatment plan with the patient. The patient was provided an opportunity to ask questions and all were answered. The patient agreed with the plan and demonstrated an understanding of the instructions.   The patient was advised to call back or seek an in-person evaluation if the symptoms worsen or if the condition fails to improve as anticipated.  I provided 20 minutes of non face-to-face telephone visit time during this encounter, and > 50% was spent spent on updating his disease status including reviewing imaging studies as well as coordinating future plan of care.  Zola Button, MD 02/10/2019 2:59 PM

## 2019-02-11 ENCOUNTER — Telehealth: Payer: Self-pay | Admitting: Oncology

## 2019-02-11 NOTE — Telephone Encounter (Signed)
Scheduled appt per 12/1 los.  Spoke with pt and they are aware of the appt date and time.

## 2019-02-19 ENCOUNTER — Other Ambulatory Visit: Payer: Self-pay | Admitting: Oncology

## 2019-02-19 DIAGNOSIS — C7931 Secondary malignant neoplasm of brain: Secondary | ICD-10-CM

## 2019-02-19 DIAGNOSIS — C763 Malignant neoplasm of pelvis: Secondary | ICD-10-CM

## 2019-02-19 DIAGNOSIS — M79604 Pain in right leg: Secondary | ICD-10-CM

## 2019-02-19 DIAGNOSIS — C3490 Malignant neoplasm of unspecified part of unspecified bronchus or lung: Secondary | ICD-10-CM

## 2019-02-19 MED ORDER — OXYCODONE HCL 15 MG PO TABS: 15.0000 mg | ORAL_TABLET | ORAL | 0 refills | Status: DC | PRN

## 2019-02-20 ENCOUNTER — Other Ambulatory Visit: Payer: Self-pay | Admitting: Oncology

## 2019-02-20 DIAGNOSIS — C763 Malignant neoplasm of pelvis: Secondary | ICD-10-CM

## 2019-02-20 DIAGNOSIS — C7931 Secondary malignant neoplasm of brain: Secondary | ICD-10-CM

## 2019-02-20 DIAGNOSIS — M79604 Pain in right leg: Secondary | ICD-10-CM

## 2019-02-20 DIAGNOSIS — M79605 Pain in left leg: Secondary | ICD-10-CM

## 2019-02-20 DIAGNOSIS — C3490 Malignant neoplasm of unspecified part of unspecified bronchus or lung: Secondary | ICD-10-CM

## 2019-02-27 ENCOUNTER — Telehealth: Payer: Self-pay

## 2019-02-27 NOTE — Telephone Encounter (Signed)
Received an on call message that patient called on-call to report concerning symptoms. Called patient back and he stated that for the past 2 weeks he has been spontaneously breaking out into sweats, increased SOB with activity, and worsened balance with occasional dizziness related to change of position. Discussed use of the albuterol inhaler and patient verbalized understanding and stated that he will utilize. He then explained his medication management has been difficult since his spouse has been in the hospital since 11/13 for leg amputation r/t diabetes. He also stated that he was on 1mg  dexamethasone per Dr. Mickeal Skinner r/t changes of his brain on the MRI but stopped this yesterday as instructed.  Spoke with Dr. Mickeal Skinner and received verbal order from Dr. Mickeal Skinner for an MD appointment and for patient to remain on Dex 1 mg until seen by MD. Contacted patient and scheduled and appointment with Dr. Mickeal Skinner for Monday, 12/21 and instructed the patient to continue to take 1 mg Dexamethasone po until he is seen by Dr. Mickeal Skinner. Patient verbalized understanding and had no other questions or concerns.

## 2019-03-02 ENCOUNTER — Other Ambulatory Visit: Payer: Self-pay

## 2019-03-02 ENCOUNTER — Inpatient Hospital Stay (HOSPITAL_BASED_OUTPATIENT_CLINIC_OR_DEPARTMENT_OTHER): Payer: Medicaid Other | Admitting: Internal Medicine

## 2019-03-02 VITALS — BP 107/79 | HR 78 | Temp 98.0°F | Resp 18 | Ht 70.0 in | Wt 163.9 lb

## 2019-03-02 DIAGNOSIS — Z9221 Personal history of antineoplastic chemotherapy: Secondary | ICD-10-CM | POA: Diagnosis not present

## 2019-03-02 DIAGNOSIS — C7931 Secondary malignant neoplasm of brain: Secondary | ICD-10-CM | POA: Diagnosis present

## 2019-03-02 DIAGNOSIS — Z923 Personal history of irradiation: Secondary | ICD-10-CM | POA: Diagnosis not present

## 2019-03-02 DIAGNOSIS — C801 Malignant (primary) neoplasm, unspecified: Secondary | ICD-10-CM | POA: Diagnosis not present

## 2019-03-02 DIAGNOSIS — C7951 Secondary malignant neoplasm of bone: Secondary | ICD-10-CM | POA: Diagnosis not present

## 2019-03-02 DIAGNOSIS — Z79899 Other long term (current) drug therapy: Secondary | ICD-10-CM | POA: Diagnosis not present

## 2019-03-02 DIAGNOSIS — G893 Neoplasm related pain (acute) (chronic): Secondary | ICD-10-CM | POA: Diagnosis not present

## 2019-03-02 MED ORDER — BACLOFEN 10 MG PO TABS: 10.0000 mg | ORAL_TABLET | Freq: Two times a day (BID) | ORAL | 1 refills | Status: DC

## 2019-03-02 MED ORDER — GABAPENTIN 800 MG PO TABS: 800.0000 mg | ORAL_TABLET | Freq: Three times a day (TID) | ORAL | 3 refills | Status: DC

## 2019-03-02 MED ORDER — DEXAMETHASONE 2 MG PO TABS: 1.0000 mg | ORAL_TABLET | Freq: Every day | ORAL | 0 refills | Status: DC
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
## 2019-03-03 ENCOUNTER — Telehealth: Payer: Self-pay | Admitting: Internal Medicine

## 2019-03-03 NOTE — Telephone Encounter (Signed)
No los per 12/21

## 2019-03-09 ENCOUNTER — Other Ambulatory Visit: Payer: Self-pay | Admitting: Oncology

## 2019-03-09 DIAGNOSIS — M79605 Pain in left leg: Secondary | ICD-10-CM

## 2019-03-09 DIAGNOSIS — C7931 Secondary malignant neoplasm of brain: Secondary | ICD-10-CM

## 2019-03-09 DIAGNOSIS — M79604 Pain in right leg: Secondary | ICD-10-CM

## 2019-03-09 DIAGNOSIS — C3490 Malignant neoplasm of unspecified part of unspecified bronchus or lung: Secondary | ICD-10-CM

## 2019-03-09 DIAGNOSIS — C763 Malignant neoplasm of pelvis: Secondary | ICD-10-CM

## 2019-03-16 ENCOUNTER — Other Ambulatory Visit: Payer: Self-pay | Admitting: Oncology

## 2019-03-16 DIAGNOSIS — G893 Neoplasm related pain (acute) (chronic): Secondary | ICD-10-CM

## 2019-03-26 ENCOUNTER — Other Ambulatory Visit: Payer: Self-pay | Admitting: Oncology

## 2019-03-26 DIAGNOSIS — M79604 Pain in right leg: Secondary | ICD-10-CM

## 2019-03-26 DIAGNOSIS — C3490 Malignant neoplasm of unspecified part of unspecified bronchus or lung: Secondary | ICD-10-CM

## 2019-03-26 DIAGNOSIS — C763 Malignant neoplasm of pelvis: Secondary | ICD-10-CM

## 2019-03-26 DIAGNOSIS — C7931 Secondary malignant neoplasm of brain: Secondary | ICD-10-CM

## 2019-03-26 MED ORDER — OXYCODONE HCL 15 MG PO TABS: 15.0000 mg | ORAL_TABLET | ORAL | 0 refills | Status: DC | PRN

## 2019-04-13 ENCOUNTER — Inpatient Hospital Stay: Payer: Medicaid Other | Attending: Internal Medicine

## 2019-04-13 ENCOUNTER — Other Ambulatory Visit: Payer: Self-pay

## 2019-04-13 ENCOUNTER — Other Ambulatory Visit: Payer: Self-pay | Admitting: Oncology

## 2019-04-13 DIAGNOSIS — M79604 Pain in right leg: Secondary | ICD-10-CM

## 2019-04-13 DIAGNOSIS — Z452 Encounter for adjustment and management of vascular access device: Secondary | ICD-10-CM | POA: Insufficient documentation

## 2019-04-13 DIAGNOSIS — C763 Malignant neoplasm of pelvis: Secondary | ICD-10-CM

## 2019-04-13 DIAGNOSIS — C3490 Malignant neoplasm of unspecified part of unspecified bronchus or lung: Secondary | ICD-10-CM | POA: Insufficient documentation

## 2019-04-13 DIAGNOSIS — C7931 Secondary malignant neoplasm of brain: Secondary | ICD-10-CM

## 2019-04-13 DIAGNOSIS — Z95828 Presence of other vascular implants and grafts: Secondary | ICD-10-CM

## 2019-04-13 MED ORDER — HEPARIN SOD (PORK) LOCK FLUSH 100 UNIT/ML IV SOLN
500.0000 [IU] | Freq: Once | INTRAVENOUS | Status: AC
  Administered 2019-04-13: 500 [IU]
  Filled 2019-04-13: qty 5

## 2019-04-13 MED ORDER — SODIUM CHLORIDE 0.9% FLUSH
10.0000 mL | Freq: Once | INTRAVENOUS | Status: AC
  Administered 2019-04-13: 09:00:00 10 mL
  Filled 2019-04-13: qty 10

## 2019-04-13 MED ORDER — OXYCODONE HCL 15 MG PO TABS: 15.0000 mg | ORAL_TABLET | ORAL | 0 refills | Status: DC | PRN

## 2019-04-13 NOTE — Patient Instructions (Signed)

## 2019-04-14 ENCOUNTER — Other Ambulatory Visit: Payer: Self-pay | Admitting: Oncology

## 2019-04-14 DIAGNOSIS — C3490 Malignant neoplasm of unspecified part of unspecified bronchus or lung: Secondary | ICD-10-CM

## 2019-04-14 DIAGNOSIS — C7931 Secondary malignant neoplasm of brain: Secondary | ICD-10-CM

## 2019-04-14 DIAGNOSIS — M79604 Pain in right leg: Secondary | ICD-10-CM

## 2019-04-14 DIAGNOSIS — C763 Malignant neoplasm of pelvis: Secondary | ICD-10-CM

## 2019-04-23 ENCOUNTER — Other Ambulatory Visit: Payer: Medicaid Other

## 2019-04-28 ENCOUNTER — Ambulatory Visit: Payer: Medicaid Other | Attending: Internal Medicine | Admitting: Internal Medicine

## 2019-04-28 ENCOUNTER — Encounter: Payer: Self-pay | Admitting: Internal Medicine

## 2019-04-28 ENCOUNTER — Telehealth: Payer: Self-pay | Admitting: Internal Medicine

## 2019-04-28 ENCOUNTER — Inpatient Hospital Stay: Payer: Medicaid Other | Admitting: Internal Medicine

## 2019-04-28 DIAGNOSIS — F17200 Nicotine dependence, unspecified, uncomplicated: Secondary | ICD-10-CM | POA: Diagnosis not present

## 2019-04-28 DIAGNOSIS — Z7952 Long term (current) use of systemic steroids: Secondary | ICD-10-CM | POA: Diagnosis not present

## 2019-04-28 DIAGNOSIS — Z79899 Other long term (current) drug therapy: Secondary | ICD-10-CM | POA: Insufficient documentation

## 2019-04-28 DIAGNOSIS — F419 Anxiety disorder, unspecified: Secondary | ICD-10-CM

## 2019-04-28 DIAGNOSIS — R569 Unspecified convulsions: Secondary | ICD-10-CM

## 2019-04-28 DIAGNOSIS — C7931 Secondary malignant neoplasm of brain: Secondary | ICD-10-CM

## 2019-04-28 DIAGNOSIS — I2510 Atherosclerotic heart disease of native coronary artery without angina pectoris: Secondary | ICD-10-CM | POA: Diagnosis not present

## 2019-04-28 DIAGNOSIS — Z87891 Personal history of nicotine dependence: Secondary | ICD-10-CM | POA: Insufficient documentation

## 2019-04-28 DIAGNOSIS — J439 Emphysema, unspecified: Secondary | ICD-10-CM | POA: Diagnosis not present

## 2019-04-28 MED ORDER — BUSPIRONE HCL 5 MG PO TABS: 5.0000 mg | ORAL_TABLET | Freq: Two times a day (BID) | ORAL | 1 refills | Status: DC

## 2019-04-28 NOTE — Telephone Encounter (Signed)
Per 2/16 sch msg. Patient aware of appt date and time

## 2019-04-28 NOTE — Progress Notes (Signed)
Virtual Visit via Telephone Note Due to current restrictions/limitations of in-office visits due to the COVID-19 pandemic, this scheduled clinical appointment was converted to a telehealth visit  I connected with Eric Mason on 04/28/19 at 2:31 p.m by telephone and verified that I am speaking with the correct person using two identifiers. I am in my office.  The patient is at home.  Only the patient and myself participated in this encounter.  I discussed the limitations, risks, security and privacy concerns of performing an evaluation and management service by telephone and the availability of in person appointments. I also discussed with the patient that there may be a patient responsible charge related to this service. The patient expressed understanding and agreed to proceed.   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  Last seen 01/2017.  Pt c/o feeling a bit jittery every once and a while. An anxious feeling.  Wife had one of her legs amputated 01/2019 and he has been trying to help her out. Feels that this adds to the anxious feeling. On Cymbalta to help with nerve pain in legs.  On Elavil to help with sleep.  Pt also on Oxycodone.  SZ:  On Keppra 2 gram BID.  Last sz about 1 yr ago.   COPD:  Using Albuterol mdi at least once a day. Trying to quit smoking; about 1 pk a day.  Patches did not help.  Vaping at times.  Brain mets:  Followed by oncology.  Outpatient Encounter Medications as of 04/28/2019  Medication Sig  . albuterol (VENTOLIN HFA) 108 (90 Base) MCG/ACT inhaler Inhale 2 puffs into the lungs every 6 (six) hours as needed for wheezing or shortness of breath.(200/8=25)  . amitriptyline (ELAVIL) 75 MG tablet Take 1 tablet (75 mg total) by mouth at bedtime.  . B Complex-C (B-COMPLEX WITH VITAMIN C)  tablet Take 1 tablet by mouth daily.  . baclofen (LIORESAL) 10 MG tablet Take 1 tablet (10 mg total) by mouth 2 (two) times daily.  . bisacodyl (DULCOLAX) 5 MG EC tablet Take 2 tablets (10 mg total) by mouth daily as needed for moderate constipation.  . Calcium Carb-Cholecalciferol (CALCIUM 500 +D) 500-400 MG-UNIT TABS 1 tab PO BID  . dexamethasone (DECADRON) 2 MG tablet Take 0.5 tablets (1 mg total) by mouth daily.  . DULoxetine (CYMBALTA) 30 MG capsule Take 1 capsule (30 mg total) by mouth daily.  . folic acid (FOLVITE) 1 MG tablet Take 1 tablet (1 mg total) by mouth daily.  [name] gabapentin (NEURONTIN) 800 MG tablet Take 1 tablet (800 mg total) by mouth 3 (three) times daily.  [name] levETIRAcetam (KEPPRA) 1000 MG tablet Take 2 tablets (2,000 mg total) by mouth 2 (two) times daily.  [name] lidocaine-prilocaine (EMLA) cream Apply 1 application topically as needed.  [name] LORazepam (ATIVAN) 1 MG tablet Take 2 tablets (2 mg total) by mouth every 8 (eight) hours as needed for seizure (emergency med for focal seizure).  . Multiple Vitamin (MULTIVITAMIN) tablet Take 1 tablet by mouth daily.  [name] nystatin (MYCOSTATIN) 100000 UNIT/ML suspension Take 5 mLs (500,000 Units total) by mouth 4 (four) times daily.  [name] oxyCODONE (ROXICODONE) 15 MG immediate release tablet Take 1 tablet (15 mg total) by mouth every 4 (four) hours as needed for pain.  . pantoprazole (PROTONIX) 40 MG tablet Take 1 tablet(s)  by mouth TWO TIMES DAILY  . polyethylene glycol (MIRALAX / GLYCOLAX) 17 g packet Take 17 g by mouth daily as needed.  . promethazine (PHENERGAN) 25 MG tablet Take 1 tablet (25 mg total) by mouth every 6 (six) hours as needed for nausea or vomiting.  . senna-docusate (SENOKOT-S) 8.6-50 MG tablet Take 2 tablets by mouth 2 (two) times daily.  [name] thiamine (VITAMIN B-1) 100 MG tablet Take 1 tablet (100 mg total) by mouth daily.  . nicotine (NICODERM CQ - DOSED IN MG/24 HOURS) 21 mg/24hr patch Place 1 patch (21 mg total) onto the skin  daily.   Facility-Administered Encounter Medications as of 04/28/2019  Medication  . sodium chloride flush (NS) 0.9 % injection 10 mL      Observations/Objective: No direct observation done as this was a telephone encounter.  Assessment and Plan: 1. Anxiety We will start him on a low-dose of BuSpar. Declines referral to mental health provider. - busPIRone (BUSPAR) 5 MG tablet; Take 1 tablet (5 mg total) by mouth 2 (two) times daily.  Dispense: 60 tablet; Refill: 1  2. Pulmonary emphysema, unspecified emphysema type ([place]) Continue to use albuterol inhaler as needed. Strongly encouraged him to discontinue smoking.  Advised against vaping as it can cause  lung injury Discussed methods to help him quit smoking.  He states he plans to continue trying to cut back on his own  3. Seizure ([place]) Stable on high-dose Keppra.  4. Tobacco dependence See #2 above.  Less than 5 minutes spent on counseling.  5. Brain metastasis ([place]) Followed by oncology   Follow Up Instructions: 3 mths   I discussed the assessment and treatment plan with the patient. The patient was provided an opportunity to ask questions and all were answered. The patient agreed with the plan and demonstrated an understanding of the instructions.   The patient was advised to call back or seek an in-person evaluation if the symptoms worsen or if the condition fails to improve as anticipated.  I provided 14 minutes of non-face-to-face time during this encounter.   Karle Plumber, MD

## 2019-04-30 ENCOUNTER — Other Ambulatory Visit: Payer: Self-pay | Admitting: Family Medicine

## 2019-04-30 ENCOUNTER — Other Ambulatory Visit: Payer: Self-pay | Admitting: Oncology

## 2019-04-30 DIAGNOSIS — C3490 Malignant neoplasm of unspecified part of unspecified bronchus or lung: Secondary | ICD-10-CM

## 2019-04-30 DIAGNOSIS — C763 Malignant neoplasm of pelvis: Secondary | ICD-10-CM

## 2019-04-30 DIAGNOSIS — M79604 Pain in right leg: Secondary | ICD-10-CM

## 2019-04-30 DIAGNOSIS — C7931 Secondary malignant neoplasm of brain: Secondary | ICD-10-CM

## 2019-04-30 DIAGNOSIS — M79605 Pain in left leg: Secondary | ICD-10-CM

## 2019-04-30 MED ORDER — OXYCODONE HCL 15 MG PO TABS: 15.0000 mg | ORAL_TABLET | ORAL | 0 refills | Status: DC | PRN

## 2019-05-01 ENCOUNTER — Other Ambulatory Visit: Payer: Self-pay | Admitting: *Deleted

## 2019-05-01 MED ORDER — BACLOFEN 10 MG PO TABS: 10.0000 mg | ORAL_TABLET | Freq: Two times a day (BID) | ORAL | 3 refills | Status: DC

## 2019-05-02 ENCOUNTER — Other Ambulatory Visit: Payer: Self-pay | Admitting: Oncology

## 2019-05-02 DIAGNOSIS — M79605 Pain in left leg: Secondary | ICD-10-CM

## 2019-05-02 DIAGNOSIS — C763 Malignant neoplasm of pelvis: Secondary | ICD-10-CM

## 2019-05-02 DIAGNOSIS — C7931 Secondary malignant neoplasm of brain: Secondary | ICD-10-CM

## 2019-05-02 DIAGNOSIS — C3490 Malignant neoplasm of unspecified part of unspecified bronchus or lung: Secondary | ICD-10-CM

## 2019-05-02 DIAGNOSIS — M79604 Pain in right leg: Secondary | ICD-10-CM

## 2019-05-06 ENCOUNTER — Other Ambulatory Visit: Payer: Self-pay | Admitting: Internal Medicine

## 2019-05-06 ENCOUNTER — Inpatient Hospital Stay: Admission: RE | Admit: 2019-05-06 | Payer: Medicaid Other | Source: Ambulatory Visit

## 2019-05-06 DIAGNOSIS — C7931 Secondary malignant neoplasm of brain: Secondary | ICD-10-CM

## 2019-05-12 ENCOUNTER — Other Ambulatory Visit: Payer: Self-pay | Admitting: *Deleted

## 2019-05-12 ENCOUNTER — Telehealth: Payer: Self-pay | Admitting: *Deleted

## 2019-05-12 ENCOUNTER — Inpatient Hospital Stay: Payer: Medicaid Other | Admitting: Internal Medicine

## 2019-05-12 DIAGNOSIS — C7931 Secondary malignant neoplasm of brain: Secondary | ICD-10-CM

## 2019-05-12 MED ORDER — AMITRIPTYLINE HCL 75 MG PO TABS: 75.0000 mg | ORAL_TABLET | Freq: Every day | ORAL | 3 refills | Status: DC

## 2019-05-12 NOTE — Telephone Encounter (Signed)
Phoned patient as he no showed for MRI of Brain and had visit today to review results.  Spoke with wife [name].  She states that patient has been unable to get off couch for over a week with nausea/vomiting every other day.  She states they would not be able to get MRI done until he gets better.  Notified Dr. Mickeal Skinner, canceled visit for today to review results since MRI hasn't occurred.  Spoke with Ruben Im, RN to see about getting patient evaluated by [facility] with Sandi Mealy, PA.  Due to transportation issues patient will come in tomorrow to be evaluated.  Labs CMP/CBC/MG ordered.  Scheduling message sent.

## 2019-05-13 ENCOUNTER — Other Ambulatory Visit: Payer: Medicaid Other

## 2019-05-13 ENCOUNTER — Encounter: Payer: Medicaid Other | Admitting: Medical

## 2019-05-19 ENCOUNTER — Other Ambulatory Visit: Payer: Self-pay | Admitting: Oncology

## 2019-05-19 DIAGNOSIS — C763 Malignant neoplasm of pelvis: Secondary | ICD-10-CM

## 2019-05-19 DIAGNOSIS — C7931 Secondary malignant neoplasm of brain: Secondary | ICD-10-CM

## 2019-05-19 DIAGNOSIS — M79604 Pain in right leg: Secondary | ICD-10-CM

## 2019-05-19 DIAGNOSIS — C3490 Malignant neoplasm of unspecified part of unspecified bronchus or lung: Secondary | ICD-10-CM

## 2019-05-19 MED ORDER — OXYCODONE HCL 15 MG PO TABS: 15.0000 mg | ORAL_TABLET | ORAL | 0 refills | Status: DC | PRN

## 2019-05-29 ENCOUNTER — Other Ambulatory Visit: Payer: Self-pay | Admitting: Internal Medicine

## 2019-05-29 MED ORDER — DEXAMETHASONE 2 MG PO TABS: 1.0000 mg | ORAL_TABLET | Freq: Every day | ORAL | 0 refills | Status: DC

## 2019-06-04 ENCOUNTER — Other Ambulatory Visit: Payer: Self-pay | Admitting: Radiation Therapy

## 2019-06-08 ENCOUNTER — Other Ambulatory Visit: Payer: Self-pay | Admitting: Oncology

## 2019-06-08 DIAGNOSIS — C763 Malignant neoplasm of pelvis: Secondary | ICD-10-CM

## 2019-06-08 DIAGNOSIS — C3490 Malignant neoplasm of unspecified part of unspecified bronchus or lung: Secondary | ICD-10-CM

## 2019-06-08 DIAGNOSIS — C7931 Secondary malignant neoplasm of brain: Secondary | ICD-10-CM

## 2019-06-08 DIAGNOSIS — M79604 Pain in right leg: Secondary | ICD-10-CM

## 2019-06-08 MED ORDER — OXYCODONE HCL 15 MG PO TABS: 15.0000 mg | ORAL_TABLET | ORAL | 0 refills | Status: DC | PRN

## 2019-06-09 ENCOUNTER — Other Ambulatory Visit: Payer: Self-pay

## 2019-06-09 ENCOUNTER — Telehealth: Payer: Self-pay

## 2019-06-09 DIAGNOSIS — R112 Nausea with vomiting, unspecified: Secondary | ICD-10-CM

## 2019-06-09 MED ORDER — PANTOPRAZOLE SODIUM 40 MG PO TBEC: DELAYED_RELEASE_TABLET | ORAL | 5 refills | Status: DC

## 2019-06-09 NOTE — Telephone Encounter (Signed)
Protonix refill sent to patient's pharmacy.

## 2019-06-09 NOTE — Telephone Encounter (Signed)
-----   Message from Wyatt Portela, MD sent at 06/09/2019  2:10 PM EDT ----- Yes, Thanks ----- Message ----- From: Tami Lin, RN Sent: 06/09/2019   1:51 PM EDT To: Wyatt Portela, MD  Ok to refill Protonix 40 mg bid with 5 refills for patient? Last filled by you 12/2018 with 5 refills. Lanelle Bal

## 2019-06-10 ENCOUNTER — Other Ambulatory Visit: Payer: Self-pay

## 2019-06-10 ENCOUNTER — Ambulatory Visit
Admission: RE | Admit: 2019-06-10 | Discharge: 2019-06-10 | Disposition: A | Discharge status: Home / Self Care | Payer: Medicaid Other | Source: Ambulatory Visit | Attending: Internal Medicine | Admitting: Internal Medicine

## 2019-06-10 DIAGNOSIS — C7931 Secondary malignant neoplasm of brain: Secondary | ICD-10-CM | POA: Diagnosis not present

## 2019-06-10 DIAGNOSIS — C801 Malignant (primary) neoplasm, unspecified: Secondary | ICD-10-CM | POA: Diagnosis not present

## 2019-06-10 MED ORDER — GADOBENATE DIMEGLUMINE 529 MG/ML IV SOLN
20.0000 mL | Freq: Once | INTRAVENOUS | Status: AC | PRN
  Administered 2019-06-10: 20 mL via INTRAVENOUS

## 2019-06-11 ENCOUNTER — Other Ambulatory Visit: Payer: Self-pay

## 2019-06-11 ENCOUNTER — Other Ambulatory Visit: Payer: Self-pay | Admitting: Internal Medicine

## 2019-06-11 ENCOUNTER — Inpatient Hospital Stay: Payer: Medicaid Other | Attending: Internal Medicine

## 2019-06-11 DIAGNOSIS — Z8249 Family history of ischemic heart disease and other diseases of the circulatory system: Secondary | ICD-10-CM | POA: Diagnosis not present

## 2019-06-11 DIAGNOSIS — C3490 Malignant neoplasm of unspecified part of unspecified bronchus or lung: Secondary | ICD-10-CM | POA: Diagnosis not present

## 2019-06-11 DIAGNOSIS — Z808 Family history of malignant neoplasm of other organs or systems: Secondary | ICD-10-CM | POA: Insufficient documentation

## 2019-06-11 DIAGNOSIS — Z7952 Long term (current) use of systemic steroids: Secondary | ICD-10-CM | POA: Insufficient documentation

## 2019-06-11 DIAGNOSIS — C7951 Secondary malignant neoplasm of bone: Secondary | ICD-10-CM | POA: Insufficient documentation

## 2019-06-11 DIAGNOSIS — F17210 Nicotine dependence, cigarettes, uncomplicated: Secondary | ICD-10-CM | POA: Diagnosis not present

## 2019-06-11 DIAGNOSIS — Z452 Encounter for adjustment and management of vascular access device: Secondary | ICD-10-CM | POA: Diagnosis not present

## 2019-06-11 DIAGNOSIS — Z800 Family history of malignant neoplasm of digestive organs: Secondary | ICD-10-CM | POA: Insufficient documentation

## 2019-06-11 DIAGNOSIS — Z95828 Presence of other vascular implants and grafts: Secondary | ICD-10-CM

## 2019-06-11 DIAGNOSIS — Z79899 Other long term (current) drug therapy: Secondary | ICD-10-CM | POA: Insufficient documentation

## 2019-06-11 DIAGNOSIS — C7931 Secondary malignant neoplasm of brain: Secondary | ICD-10-CM | POA: Diagnosis present

## 2019-06-11 DIAGNOSIS — Z803 Family history of malignant neoplasm of breast: Secondary | ICD-10-CM | POA: Insufficient documentation

## 2019-06-11 MED ORDER — HEPARIN SOD (PORK) LOCK FLUSH 100 UNIT/ML IV SOLN
250.0000 [IU] | Freq: Once | INTRAVENOUS | Status: AC
  Administered 2019-06-11: 09:00:00 250 [IU]
  Filled 2019-06-11: qty 5

## 2019-06-11 MED ORDER — DEXAMETHASONE 2 MG PO TABS: 1.0000 mg | ORAL_TABLET | Freq: Every day | ORAL | 0 refills | Status: DC

## 2019-06-11 MED ORDER — SODIUM CHLORIDE 0.9% FLUSH
10.0000 mL | Freq: Once | INTRAVENOUS | Status: AC
  Administered 2019-06-11: 09:00:00 10 mL
  Filled 2019-06-11: qty 10

## 2019-06-15 ENCOUNTER — Inpatient Hospital Stay: Payer: Medicaid Other | Admitting: Internal Medicine

## 2019-06-15 ENCOUNTER — Inpatient Hospital Stay: Payer: Medicaid Other

## 2019-06-16 ENCOUNTER — Other Ambulatory Visit: Payer: Self-pay | Admitting: Internal Medicine

## 2019-06-16 MED ORDER — DEXAMETHASONE 2 MG PO TABS: 1.0000 mg | ORAL_TABLET | Freq: Every day | ORAL | 0 refills | Status: DC

## 2019-06-19 ENCOUNTER — Other Ambulatory Visit: Payer: Self-pay

## 2019-06-19 ENCOUNTER — Inpatient Hospital Stay (HOSPITAL_BASED_OUTPATIENT_CLINIC_OR_DEPARTMENT_OTHER): Payer: Medicaid Other | Admitting: Internal Medicine

## 2019-06-19 VITALS — BP 137/77 | HR 67 | Temp 98.0°F | Resp 18 | Ht 70.0 in | Wt 166.1 lb

## 2019-06-19 DIAGNOSIS — C7931 Secondary malignant neoplasm of brain: Secondary | ICD-10-CM

## 2019-06-19 DIAGNOSIS — Z452 Encounter for adjustment and management of vascular access device: Secondary | ICD-10-CM | POA: Diagnosis not present
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
## 2019-06-22 ENCOUNTER — Telehealth: Payer: Self-pay | Admitting: Internal Medicine

## 2019-06-22 NOTE — Telephone Encounter (Signed)
Scheduled appt per 4/9 los.  Spoke with pt and they are aware of the scheduled appt date and time.

## 2019-06-24 ENCOUNTER — Other Ambulatory Visit: Payer: Self-pay | Admitting: Oncology

## 2019-06-24 DIAGNOSIS — M79604 Pain in right leg: Secondary | ICD-10-CM

## 2019-06-24 DIAGNOSIS — C763 Malignant neoplasm of pelvis: Secondary | ICD-10-CM

## 2019-06-24 DIAGNOSIS — C7931 Secondary malignant neoplasm of brain: Secondary | ICD-10-CM

## 2019-06-24 DIAGNOSIS — C3490 Malignant neoplasm of unspecified part of unspecified bronchus or lung: Secondary | ICD-10-CM

## 2019-06-24 MED ORDER — OXYCODONE HCL 15 MG PO TABS: 15.0000 mg | ORAL_TABLET | ORAL | 0 refills | Status: DC | PRN

## 2019-07-02 ENCOUNTER — Other Ambulatory Visit: Payer: Self-pay | Admitting: *Deleted

## 2019-07-02 MED ORDER — GABAPENTIN 800 MG PO TABS: 800.0000 mg | ORAL_TABLET | Freq: Three times a day (TID) | ORAL | 0 refills | Status: DC

## 2019-07-06 ENCOUNTER — Other Ambulatory Visit: Payer: Self-pay | Admitting: Medical

## 2019-07-06 DIAGNOSIS — C7931 Secondary malignant neoplasm of brain: Secondary | ICD-10-CM

## 2019-07-06 DIAGNOSIS — M79604 Pain in right leg: Secondary | ICD-10-CM

## 2019-07-06 DIAGNOSIS — G893 Neoplasm related pain (acute) (chronic): Secondary | ICD-10-CM

## 2019-07-06 DIAGNOSIS — C3490 Malignant neoplasm of unspecified part of unspecified bronchus or lung: Secondary | ICD-10-CM

## 2019-07-06 DIAGNOSIS — C763 Malignant neoplasm of pelvis: Secondary | ICD-10-CM

## 2019-07-06 DIAGNOSIS — M79605 Pain in left leg: Secondary | ICD-10-CM

## 2019-07-10 ENCOUNTER — Other Ambulatory Visit: Payer: Self-pay | Admitting: Oncology

## 2019-07-10 DIAGNOSIS — C7931 Secondary malignant neoplasm of brain: Secondary | ICD-10-CM

## 2019-07-10 DIAGNOSIS — C763 Malignant neoplasm of pelvis: Secondary | ICD-10-CM

## 2019-07-10 DIAGNOSIS — C3490 Malignant neoplasm of unspecified part of unspecified bronchus or lung: Secondary | ICD-10-CM

## 2019-07-10 DIAGNOSIS — M79605 Pain in left leg: Secondary | ICD-10-CM

## 2019-07-10 DIAGNOSIS — M79604 Pain in right leg: Secondary | ICD-10-CM

## 2019-07-10 MED ORDER — OXYCODONE HCL 15 MG PO TABS: 15.0000 mg | ORAL_TABLET | ORAL | 0 refills | Status: DC | PRN

## 2019-07-14 DIAGNOSIS — R918 Other nonspecific abnormal finding of lung field: Secondary | ICD-10-CM | POA: Diagnosis not present

## 2019-07-14 DIAGNOSIS — C3490 Malignant neoplasm of unspecified part of unspecified bronchus or lung: Secondary | ICD-10-CM | POA: Diagnosis not present

## 2019-07-14 DIAGNOSIS — Z20822 Contact with and (suspected) exposure to covid-19: Secondary | ICD-10-CM | POA: Diagnosis not present

## 2019-07-14 DIAGNOSIS — R0602 Shortness of breath: Secondary | ICD-10-CM | POA: Diagnosis not present

## 2019-07-14 DIAGNOSIS — R112 Nausea with vomiting, unspecified: Secondary | ICD-10-CM | POA: Diagnosis not present

## 2019-07-14 DIAGNOSIS — R197 Diarrhea, unspecified: Secondary | ICD-10-CM | POA: Diagnosis not present

## 2019-07-14 DIAGNOSIS — K429 Umbilical hernia without obstruction or gangrene: Secondary | ICD-10-CM | POA: Diagnosis not present

## 2019-07-14 DIAGNOSIS — R1084 Generalized abdominal pain: Secondary | ICD-10-CM | POA: Diagnosis not present

## 2019-07-14 DIAGNOSIS — D72829 Elevated white blood cell count, unspecified: Secondary | ICD-10-CM | POA: Diagnosis not present

## 2019-07-14 DIAGNOSIS — D509 Iron deficiency anemia, unspecified: Secondary | ICD-10-CM | POA: Diagnosis not present

## 2019-07-14 DIAGNOSIS — R109 Unspecified abdominal pain: Secondary | ICD-10-CM | POA: Diagnosis not present

## 2019-07-14 DIAGNOSIS — E876 Hypokalemia: Secondary | ICD-10-CM | POA: Diagnosis not present

## 2019-07-14 DIAGNOSIS — R1032 Left lower quadrant pain: Secondary | ICD-10-CM | POA: Diagnosis not present

## 2019-07-15 ENCOUNTER — Telehealth: Payer: Self-pay | Admitting: Emergency Medicine

## 2019-07-15 DIAGNOSIS — C3491 Malignant neoplasm of unspecified part of right bronchus or lung: Secondary | ICD-10-CM | POA: Diagnosis not present

## 2019-07-15 DIAGNOSIS — I517 Cardiomegaly: Secondary | ICD-10-CM | POA: Diagnosis not present

## 2019-07-15 DIAGNOSIS — R778 Other specified abnormalities of plasma proteins: Secondary | ICD-10-CM | POA: Diagnosis not present

## 2019-07-15 DIAGNOSIS — R109 Unspecified abdominal pain: Secondary | ICD-10-CM | POA: Diagnosis not present

## 2019-07-15 DIAGNOSIS — I5030 Unspecified diastolic (congestive) heart failure: Secondary | ICD-10-CM | POA: Diagnosis not present

## 2019-07-15 DIAGNOSIS — I10 Essential (primary) hypertension: Secondary | ICD-10-CM | POA: Diagnosis not present

## 2019-07-15 DIAGNOSIS — I459 Conduction disorder, unspecified: Secondary | ICD-10-CM | POA: Diagnosis not present

## 2019-07-15 DIAGNOSIS — R112 Nausea with vomiting, unspecified: Secondary | ICD-10-CM | POA: Diagnosis not present

## 2019-07-15 DIAGNOSIS — R000 Tachycardia, unspecified: Secondary | ICD-10-CM | POA: Diagnosis not present

## 2019-07-15 MED ORDER — DULOXETINE HCL 30 MG PO CPEP
30.00 | ORAL_CAPSULE | ORAL | Status: DC
Start: 2019-07-16 — End: 2019-07-15

## 2019-07-15 MED ORDER — POTASSIUM CHLORIDE IN NACL 40-0.9 MEQ/L-% IV SOLN
INTRAVENOUS | Status: DC
Start: ? — End: 2019-07-15

## 2019-07-15 MED ORDER — BUSPIRONE HCL 5 MG PO TABS
5.00 | ORAL_TABLET | ORAL | Status: DC
Start: 2019-07-15 — End: 2019-07-15

## 2019-07-15 MED ORDER — FOLIC ACID 1 MG PO TABS
1.00 | ORAL_TABLET | ORAL | Status: DC
Start: 2019-07-16 — End: 2019-07-15

## 2019-07-15 MED ORDER — LORAZEPAM 2 MG/ML IJ SOLN
1.00 | INTRAMUSCULAR | Status: DC
Start: ? — End: 2019-07-15

## 2019-07-15 MED ORDER — THIAMINE HCL 100 MG PO TABS
100.00 | ORAL_TABLET | ORAL | Status: DC
Start: 2019-07-15 — End: 2019-07-15

## 2019-07-15 MED ORDER — DEXAMETHASONE 4 MG PO TABS
2.00 | ORAL_TABLET | ORAL | Status: DC
Start: 2019-07-16 — End: 2019-07-15

## 2019-07-15 MED ORDER — GABAPENTIN 400 MG PO CAPS
400.00 | ORAL_CAPSULE | ORAL | Status: DC
Start: 2019-07-15 — End: 2019-07-15

## 2019-07-15 MED ORDER — LORAZEPAM 1 MG PO TABS
2.00 | ORAL_TABLET | ORAL | Status: DC
Start: ? — End: 2019-07-15

## 2019-07-15 MED ORDER — POTASSIUM CHLORIDE CRYS ER 20 MEQ PO TBCR
40.00 | EXTENDED_RELEASE_TABLET | ORAL | Status: DC
Start: 2019-07-16 — End: 2019-07-15

## 2019-07-15 MED ORDER — QUINTABS PO TABS
1.00 | ORAL_TABLET | ORAL | Status: DC
Start: 2019-07-15 — End: 2019-07-15

## 2019-07-15 MED ORDER — PANTOPRAZOLE SODIUM 40 MG PO TBEC
40.00 | DELAYED_RELEASE_TABLET | ORAL | Status: DC
Start: 2019-07-15 — End: 2019-07-15

## 2019-07-15 MED ORDER — OXYCODONE HCL 5 MG PO TABS
15.00 | ORAL_TABLET | ORAL | Status: DC
Start: ? — End: 2019-07-15

## 2019-07-15 MED ORDER — POLYETHYLENE GLYCOL 3350 17 GM/SCOOP PO POWD
238.00 | ORAL | Status: DC
Start: 2019-07-15 — End: 2019-07-15

## 2019-07-15 MED ORDER — LEVETIRACETAM 500 MG PO TABS
2000.00 | ORAL_TABLET | ORAL | Status: DC
Start: 2019-07-15 — End: 2019-07-15

## 2019-07-15 MED ORDER — HYDRALAZINE HCL 10 MG PO TABS
10.00 | ORAL_TABLET | ORAL | Status: DC
Start: ? — End: 2019-07-15

## 2019-07-15 MED ORDER — HYDRALAZINE HCL 25 MG PO TABS
25.00 | ORAL_TABLET | ORAL | Status: DC
Start: 2019-07-15 — End: 2019-07-15

## 2019-07-15 MED ORDER — LISINOPRIL 20 MG PO TABS
20.00 | ORAL_TABLET | ORAL | Status: DC
Start: 2019-07-16 — End: 2019-07-15

## 2019-07-15 MED ORDER — BISACODYL 5 MG PO TBEC
20.00 | DELAYED_RELEASE_TABLET | ORAL | Status: DC
Start: 2019-07-15 — End: 2019-07-15

## 2019-07-15 MED ORDER — ONDANSETRON HCL 4 MG/2ML IJ SOLN
4.00 | INTRAMUSCULAR | Status: DC
Start: ? — End: 2019-07-15

## 2019-07-15 MED ORDER — ALUM & MAG HYDROXIDE-SIMETH 200-200-20 MG/5ML PO SUSP
30.00 | ORAL | Status: DC
Start: ? — End: 2019-07-15

## 2019-07-15 MED ORDER — ALBUTEROL SULFATE HFA 108 (90 BASE) MCG/ACT IN AERS
2.00 | INHALATION_SPRAY | RESPIRATORY_TRACT | Status: DC
Start: ? — End: 2019-07-15

## 2019-07-15 MED ORDER — MAGNESIUM OXIDE 400 MG PO TABS
400.00 | ORAL_TABLET | ORAL | Status: DC
Start: 2019-07-15 — End: 2019-07-15

## 2019-07-15 MED ORDER — AMLODIPINE BESYLATE 5 MG PO TABS
5.00 | ORAL_TABLET | ORAL | Status: DC
Start: 2019-07-16 — End: 2019-07-15

## 2019-07-15 MED ORDER — MELATONIN 3 MG PO TABS
3.00 | ORAL_TABLET | ORAL | Status: DC
Start: ? — End: 2019-07-15

## 2019-07-15 MED ORDER — ENOXAPARIN SODIUM 40 MG/0.4ML ~~LOC~~ SOLN
40.00 | SUBCUTANEOUS | Status: DC
Start: 2019-07-15 — End: 2019-07-15

## 2019-07-15 MED ORDER — ACETAMINOPHEN 325 MG PO TABS
650.00 | ORAL_TABLET | ORAL | Status: DC
Start: ? — End: 2019-07-15

## 2019-07-15 NOTE — Telephone Encounter (Signed)
Received call from [place] (GI) with [facility] requesting last chemo administration date and patient's current tx plan.  Informed PA that pt's last documented infusion (Keytruda) was infused on 10/02/2017 and that per last MD note ([place]- 06/19/2019) he is stable and instructed to return for f/u in three months.  Also provided per PA request pt's two most recent Hgb levels on record.  PA declined any further questions at this time.  Per PA pt is demonstrating iron deficiency anemia with bloody stools, with a Hgb level of 9.6 yesterday and positive urine drug screening tests for cocaine, oxycodone, and marijuana.  Routed phone call note to MD [place] office.

## 2019-07-16 DIAGNOSIS — K648 Other hemorrhoids: Secondary | ICD-10-CM | POA: Diagnosis not present

## 2019-07-16 DIAGNOSIS — I10 Essential (primary) hypertension: Secondary | ICD-10-CM | POA: Diagnosis not present

## 2019-07-16 DIAGNOSIS — R195 Other fecal abnormalities: Secondary | ICD-10-CM | POA: Diagnosis not present

## 2019-07-16 DIAGNOSIS — R112 Nausea with vomiting, unspecified: Secondary | ICD-10-CM | POA: Diagnosis not present

## 2019-07-16 DIAGNOSIS — R778 Other specified abnormalities of plasma proteins: Secondary | ICD-10-CM | POA: Diagnosis not present

## 2019-07-16 DIAGNOSIS — C3491 Malignant neoplasm of unspecified part of right bronchus or lung: Secondary | ICD-10-CM | POA: Diagnosis not present

## 2019-07-16 DIAGNOSIS — R1032 Left lower quadrant pain: Secondary | ICD-10-CM | POA: Diagnosis not present

## 2019-07-16 DIAGNOSIS — R109 Unspecified abdominal pain: Secondary | ICD-10-CM | POA: Diagnosis not present

## 2019-07-16 DIAGNOSIS — D509 Iron deficiency anemia, unspecified: Secondary | ICD-10-CM | POA: Diagnosis not present

## 2019-07-16 DIAGNOSIS — K222 Esophageal obstruction: Secondary | ICD-10-CM | POA: Diagnosis not present

## 2019-07-16 DIAGNOSIS — K449 Diaphragmatic hernia without obstruction or gangrene: Secondary | ICD-10-CM | POA: Diagnosis not present

## 2019-07-16 DIAGNOSIS — R1031 Right lower quadrant pain: Secondary | ICD-10-CM | POA: Diagnosis not present

## 2019-07-16 LAB — HM COLONOSCOPY

## 2019-07-16 NOTE — Telephone Encounter (Signed)
Refill request

## 2019-07-17 DIAGNOSIS — C3491 Malignant neoplasm of unspecified part of right bronchus or lung: Secondary | ICD-10-CM | POA: Diagnosis not present

## 2019-07-17 DIAGNOSIS — R109 Unspecified abdominal pain: Secondary | ICD-10-CM | POA: Diagnosis not present

## 2019-07-17 DIAGNOSIS — R112 Nausea with vomiting, unspecified: Secondary | ICD-10-CM | POA: Diagnosis not present

## 2019-07-17 DIAGNOSIS — I10 Essential (primary) hypertension: Secondary | ICD-10-CM | POA: Diagnosis not present

## 2019-07-17 DIAGNOSIS — R778 Other specified abnormalities of plasma proteins: Secondary | ICD-10-CM | POA: Diagnosis not present

## 2019-07-20 ENCOUNTER — Telehealth: Payer: Self-pay | Admitting: Oncology

## 2019-07-20 NOTE — Telephone Encounter (Signed)
Called pt per 5/10 sch message - no answer called pt and spouse. Unable to leave message

## 2019-07-21 ENCOUNTER — Telehealth: Payer: Self-pay | Admitting: Oncology

## 2019-07-21 ENCOUNTER — Telehealth: Payer: Self-pay

## 2019-07-21 NOTE — Telephone Encounter (Signed)
Scheduled appt per 5/11 sch message - unable to reach pt . Left message with appt date and time

## 2019-07-21 NOTE — Telephone Encounter (Signed)
-----   Message from Wyatt Portela, MD sent at 07/21/2019 12:26 PM EDT ----- I will send a message to scheduling. We need cancel scans on 6/4. Thanks ----- Message ----- From: Tami Lin, RN Sent: 07/21/2019  12:03 PM EDT To: Wyatt Portela, MD  Patient's wife called and stated patient was seen in the ED at [facility] a couple of days ago and was advised to follow-up with primary oncologist at [facility] within 1 week's time given new findings on chest CT indicative of recurrence of malignancy. Patient's next scheduled appointment is 08/14/19. Do you want him scheduled sooner?

## 2019-07-21 NOTE — Telephone Encounter (Signed)
Called patient and let him know that Dr. Alen Blew will send a scheduling message to get him scheduled sooner and that scans on 6/4 will be canceled. Patient verbalized understanding.

## 2019-07-24 ENCOUNTER — Inpatient Hospital Stay: Payer: Medicaid Other | Attending: Internal Medicine | Admitting: Oncology

## 2019-07-24 ENCOUNTER — Other Ambulatory Visit: Payer: Self-pay

## 2019-07-24 DIAGNOSIS — C7800 Secondary malignant neoplasm of unspecified lung: Secondary | ICD-10-CM | POA: Diagnosis not present

## 2019-07-24 DIAGNOSIS — C3490 Malignant neoplasm of unspecified part of unspecified bronchus or lung: Secondary | ICD-10-CM | POA: Diagnosis not present

## 2019-07-24 DIAGNOSIS — C763 Malignant neoplasm of pelvis: Secondary | ICD-10-CM

## 2019-07-24 DIAGNOSIS — C7951 Secondary malignant neoplasm of bone: Secondary | ICD-10-CM | POA: Insufficient documentation

## 2019-07-24 DIAGNOSIS — M79604 Pain in right leg: Secondary | ICD-10-CM | POA: Diagnosis not present

## 2019-07-24 DIAGNOSIS — Z7952 Long term (current) use of systemic steroids: Secondary | ICD-10-CM | POA: Diagnosis not present

## 2019-07-24 DIAGNOSIS — C801 Malignant (primary) neoplasm, unspecified: Secondary | ICD-10-CM | POA: Insufficient documentation

## 2019-07-24 DIAGNOSIS — Z79899 Other long term (current) drug therapy: Secondary | ICD-10-CM | POA: Insufficient documentation

## 2019-07-24 DIAGNOSIS — C7931 Secondary malignant neoplasm of brain: Secondary | ICD-10-CM

## 2019-07-24 DIAGNOSIS — M79605 Pain in left leg: Secondary | ICD-10-CM

## 2019-07-24 DIAGNOSIS — Z923 Personal history of irradiation: Secondary | ICD-10-CM | POA: Insufficient documentation

## 2019-07-24 MED ORDER — OXYCODONE HCL 15 MG PO TABS: 15.0000 mg | ORAL_TABLET | ORAL | 0 refills | Status: DC | PRN

## 2019-07-24 NOTE — Progress Notes (Signed)
Hematology and Oncology Follow Up   Eric Mason [id number] 01/14/1971 49 y.o. 07/24/2019 1:04 PM Eric Mason, [name], Eric Batman, MD       Principle Diagnosis: 49 year old with stage IV poorly differentiated tumor diagnosed in 2018.  He was found to have pulmonary lesions and CNS metastasis.    Prior Therapy:Status post craniotomy and resection in May 2018. This was followed by [facility] completed in June 2018.  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  Carboplatin and paclitaxel with Pembrolizumab cycle 1 given on 06/18/2017.  He completed 4 cycles of carboplatin and paclitaxel and completed 6 cycles of Pembrolizumab total.  He is status post repeat radiation to L5 presumed tumor ablated in June 2020.  Current therapy: Active surveillance.   Interim History: [name] returns today for a follow-up visit.  Since her last visit, he was hospitalized at [facility] with complaints of nausea and vomiting and back pain.  During his hospitalization he underwent imaging studies which showed bilateral hilar and mediastinal adenopathy with increase in the size of some pulmonary nodules without any embolism.  This is discharge she has felt better but does have constellation of symptoms including fatigue and chronic pain.  He is currently on oxycodone which is managing his pain at this time.     Medications: Updated on review. Current Outpatient Medications  Medication Sig Dispense Refill  . albuterol (VENTOLIN HFA) 108 (90 Base) MCG/ACT inhaler Inhale 2 puffs into the lungs every 6 (six) hours as needed for wheezing or shortness of breath.(200/8=25) 8.5 g 6  . amitriptyline (ELAVIL) 75 MG tablet Take 1 tablet (75 mg total) by mouth at bedtime. 30 tablet 3  . B Complex-C (B-COMPLEX WITH VITAMIN C) tablet Take 1 tablet by mouth daily.    .  baclofen (LIORESAL) 10 MG tablet Take 1 tablet (10 mg total) by mouth 2 (two) times daily. 60 each 3  . bisacodyl (DULCOLAX) 5 MG EC tablet Take 2 tablets (10 mg total) by mouth daily as needed for moderate constipation. 30 tablet 0  . busPIRone (BUSPAR) 5 MG tablet Take 1 tablet (5 mg total) by mouth 2 (two) times daily. 60 tablet 1  . Calcium Carb-Cholecalciferol (CALCIUM 500 +D) 500-400 MG-UNIT TABS 1 tab PO BID 60 tablet 2  . dexamethasone (DECADRON) 2 MG tablet Take 0.5 tablets (1 mg total) by mouth daily. 30 tablet 0  . DULoxetine (CYMBALTA) 30 MG capsule Take 1 capsule (30 mg total) by mouth daily. 30 capsule 5  . folic acid (FOLVITE) 1 MG tablet Take 1 tablet (1 mg total) by mouth daily. 100 tablet 1  . gabapentin (NEURONTIN) 800 MG tablet Take 1 tablet (800 mg total) by mouth 3 (three) times daily. 90 tablet 0  . levETIRAcetam (KEPPRA) 1000 MG tablet Take 2 tablets (2,000 mg total) by mouth 2 (two) times daily. 120 tablet 3  . lidocaine-prilocaine (EMLA) cream Apply 1 application topically as needed. 30 g 0  . Multiple Vitamin (MULTIVITAMIN) tablet Take 1 tablet by mouth daily.    . nicotine (NICODERM CQ - DOSED IN MG/24 HOURS) 21 mg/24hr patch Place 1 patch (21 mg total) onto the skin daily. 28 patch 3  . nystatin (MYCOSTATIN) 100000 UNIT/ML suspension Take 5 mLs (500,000 Units total) by mouth 4 (four) times daily. 120 mL 2  . oxyCODONE (ROXICODONE) 15 MG immediate release tablet Take 1 tablet (15  mg total) by mouth every 4 (four) hours as needed for pain. 126 tablet 0  . pantoprazole (PROTONIX) 40 MG tablet Take 1 tablet(s) by mouth TWO TIMES DAILY 60 tablet 5  . polyethylene glycol (MIRALAX / GLYCOLAX) 17 g packet Take 17 g by mouth daily as needed. 14 each 0  . promethazine (PHENERGAN) 25 MG tablet Take 1 tablet (25 mg total) by mouth every 6 (six) hours as needed for nausea or vomiting. 30 tablet 1  . senna-docusate (SENOKOT-S) 8.6-50 MG tablet Take 2 tablets by mouth 2 (two) times  daily. 30 tablet 1  . thiamine (VITAMIN B-1) 100 MG tablet Take 1 tablet (100 mg total) by mouth daily. 100 tablet 0   No current facility-administered medications for this visit.   Facility-Administered Medications Ordered in Other Visits  Medication Dose Route Frequency Provider Last Rate Last Admin  . sodium chloride flush (NS) 0.9 % injection 10 mL  10 mL Intracatheter PRN Wyatt Portela, MD   10 mL at 06/18/17 1815     Allergies:  Allergies  Allergen Reactions  . Latex Itching and Rash     General appearance: Comfortable appearing without any discomfort Head: Normocephalic without any trauma Oropharynx: Mucous membranes are moist and pink without any thrush or ulcers. Eyes: Pupils are equal and round reactive to light. Lymph nodes: No cervical, supraclavicular, inguinal or axillary lymphadenopathy.   Heart:regular rate and rhythm.  S1 and S2 without leg edema. Lung: Clear without any rhonchi or wheezes.  No dullness to percussion. Abdomin: Soft, nontender, nondistended with good bowel sounds.  No hepatosplenomegaly. Musculoskeletal: No joint deformity or effusion.  Full range of motion noted. Neurological: No deficits noted on motor, sensory and deep tendon reflex exam. Skin: No petechial rash or dryness.  Appeared moist.         Lab Results: Lab Results  Component Value Date   WBC 9.1 02/03/2019   HGB 11.4 (L) 02/03/2019   HCT 34.3 (L) 02/03/2019   MCV 77.4 (L) 02/03/2019   PLT 370 02/03/2019     Chemistry      Component Value Date/Time   NA 136 02/03/2019 0839   NA 137 10/30/2016 0909   K 3.7 02/03/2019 0839   K 3.3 (L) 10/30/2016 0909   CL 102 02/03/2019 0839   CO2 26 02/03/2019 0839   CO2 27 10/30/2016 0909   BUN 14 02/03/2019 0839   BUN 4.8 (L) 10/30/2016 0909   CREATININE 0.83 02/03/2019 0839   CREATININE 0.9 10/30/2016 0909      Component Value Date/Time   CALCIUM 9.1 02/03/2019 0839   CALCIUM 9.3 10/30/2016 0909   ALKPHOS 95 02/03/2019 0839    ALKPHOS 93 10/30/2016 0909   AST 14 (L) 02/03/2019 0839   AST 35 (H) 10/30/2016 0909   ALT 11 02/03/2019 0839   ALT 25 10/30/2016 0909   BILITOT 0.4 02/03/2019 0839   BILITOT 0.41 10/30/2016 0909     IMPRESSION: 1. Stable appearance of the chest. Stable 1.0 x 0.9 cm nodule of the posterior right upper lobe (series 6, image 69). Stable 5 mm nodule of the anterior right upper lobe (series 6, image 57). Stable 3 mm nodule of the left lower lobe (series 6, image 118). Unchanged bandlike scarring of the lateral segment right middle lung (series 6, image 112).  2. Stable small sclerotic lesion of the left ilium (series 2, image 92). No evidence of new osseous metastatic disease.  3. Left L5 nerve root lesion better  demonstrated by prior MRI is not well appreciated by CT.  4. No evidence of new metastatic disease in the chest, abdomen, or pelvis.  5.  Emphysema (ICD10-J43.9).  6.  Coronary artery disease.  7.  Aortic Atherosclerosis (ICD10-I70.0).   IMPRESSION: Bilateral hilar and mediastinal adenopathy, new since the prior CT. Increase in the size of several pulmonary nodules as well as interval development of an ill-defined area of nodularity in the left upper lobe. No CT evidence of pulmonary embolism. No acute intra-abdominal or pelvic pathology. No bowel obstruction or active inflammation. Normal appendix. No evidence of metastatic disease in the abdomen or pelvis. Bilateral femoral head avascular necrosis. No acute fracture or cortical collapse. Aortic Atherosclerosis (ICD10-I70.0) and Emphysema (ICD10-J43.9).  Impression and Plan:  49 year old man with:  1.    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    He is currently on active surveillance after completed systemic therapy outlined above.  CT scan obtained on May 4 of 2021 was personally reviewed and discussed with the patient and showed  slight progression of disease with ill-defined area of nodularity in the left upper lobe that are new since the prior CT scan.  6 mm nodule in the lingula which also new.  A 16 mm right middle lobe nodule is relatively similar.  A 5 mm right upper lobe nodule similar or minimally increased.  Stable appearance of the posterior right upper lobe nodule at this time.  He is right hilar lymph node measuring 10 mm with mildly enlarged lymph node in the aortopulmonary window measuring 12 mm.  These findings are around worrisome for systemic disease progression.  Treatment options were reviewed at this time which include systemic chemotherapy versus oral targeted therapy if his tumor harbors an appropriate mutation.  I favor obtaining tissue biopsy at this time before proceeding with any systemic therapy.  I will refer him to pulmonary medicine after obtaining a PET scan to update his staging.  2.  CNS and spinal metastasis: He is following with Dr. Mickeal Skinner regarding this issue.  3.  IV access: Port-A-Cath remains in place and will be flushed periodically.  4.  Pain: He is currently on oxycodone with pain is manageable.  His pain is related to his malignancy and previous treatment.   5. Follow-up:  In the next few weeks to follow his progress.  30  minutes were dedicated to this visit. The time was spent on reviewing laboratory data, imaging studies, discussing treatment options, discussing differential diagnosis and answering questions regarding future plan.    Zola Button, MD 07/24/2019 1:04 PM

## 2019-07-27 ENCOUNTER — Ambulatory Visit: Payer: Medicaid Other | Admitting: Internal Medicine

## 2019-08-04 ENCOUNTER — Other Ambulatory Visit: Payer: Self-pay | Admitting: Oncology

## 2019-08-04 DIAGNOSIS — C7931 Secondary malignant neoplasm of brain: Secondary | ICD-10-CM

## 2019-08-04 DIAGNOSIS — C3490 Malignant neoplasm of unspecified part of unspecified bronchus or lung: Secondary | ICD-10-CM

## 2019-08-04 DIAGNOSIS — C763 Malignant neoplasm of pelvis: Secondary | ICD-10-CM

## 2019-08-04 DIAGNOSIS — G893 Neoplasm related pain (acute) (chronic): Secondary | ICD-10-CM

## 2019-08-04 DIAGNOSIS — M79604 Pain in right leg: Secondary | ICD-10-CM

## 2019-08-04 MED ORDER — OXYCODONE HCL 15 MG PO TABS: 15.0000 mg | ORAL_TABLET | ORAL | 0 refills | Status: DC | PRN

## 2019-08-06 ENCOUNTER — Other Ambulatory Visit: Payer: Self-pay | Admitting: Internal Medicine

## 2019-08-06 ENCOUNTER — Other Ambulatory Visit: Payer: Self-pay | Admitting: *Deleted

## 2019-08-06 MED ORDER — BACLOFEN 10 MG PO TABS: 10.0000 mg | ORAL_TABLET | Freq: Two times a day (BID) | ORAL | 3 refills | Status: DC

## 2019-08-06 MED ORDER — AMITRIPTYLINE HCL 75 MG PO TABS: 75.0000 mg | ORAL_TABLET | Freq: Every day | ORAL | 3 refills | Status: DC

## 2019-08-06 MED ORDER — LEVETIRACETAM 1000 MG PO TABS: 2000.0000 mg | ORAL_TABLET | Freq: Two times a day (BID) | ORAL | 3 refills | Status: DC

## 2019-08-06 MED ORDER — DEXAMETHASONE 2 MG PO TABS: 1.0000 mg | ORAL_TABLET | Freq: Every day | ORAL | 0 refills | Status: DC

## 2019-08-07 ENCOUNTER — Other Ambulatory Visit: Payer: Self-pay | Admitting: *Deleted

## 2019-08-07 MED ORDER — DEXAMETHASONE 2 MG PO TABS: 1.0000 mg | ORAL_TABLET | Freq: Every day | ORAL | 0 refills | Status: DC

## 2019-08-14 ENCOUNTER — Ambulatory Visit (HOSPITAL_COMMUNITY): Payer: Medicaid Other

## 2019-08-14 ENCOUNTER — Inpatient Hospital Stay: Payer: Medicaid Other

## 2019-08-14 ENCOUNTER — Inpatient Hospital Stay: Payer: Medicaid Other | Attending: Internal Medicine

## 2019-08-14 DIAGNOSIS — Z79899 Other long term (current) drug therapy: Secondary | ICD-10-CM | POA: Insufficient documentation

## 2019-08-14 DIAGNOSIS — C7951 Secondary malignant neoplasm of bone: Secondary | ICD-10-CM | POA: Insufficient documentation

## 2019-08-14 DIAGNOSIS — G893 Neoplasm related pain (acute) (chronic): Secondary | ICD-10-CM | POA: Insufficient documentation

## 2019-08-14 DIAGNOSIS — Z9221 Personal history of antineoplastic chemotherapy: Secondary | ICD-10-CM | POA: Insufficient documentation

## 2019-08-14 DIAGNOSIS — Z7982 Long term (current) use of aspirin: Secondary | ICD-10-CM | POA: Insufficient documentation

## 2019-08-14 DIAGNOSIS — C801 Malignant (primary) neoplasm, unspecified: Secondary | ICD-10-CM | POA: Insufficient documentation

## 2019-08-14 DIAGNOSIS — Z7952 Long term (current) use of systemic steroids: Secondary | ICD-10-CM | POA: Insufficient documentation

## 2019-08-14 DIAGNOSIS — Z923 Personal history of irradiation: Secondary | ICD-10-CM | POA: Insufficient documentation

## 2019-08-14 DIAGNOSIS — C7931 Secondary malignant neoplasm of brain: Secondary | ICD-10-CM | POA: Insufficient documentation

## 2019-08-17 ENCOUNTER — Telehealth: Payer: Self-pay

## 2019-08-17 ENCOUNTER — Encounter (HOSPITAL_COMMUNITY): Payer: Self-pay | Admitting: Internal Medicine

## 2019-08-17 ENCOUNTER — Inpatient Hospital Stay (HOSPITAL_COMMUNITY)
Admission: EM | Admit: 2019-08-17 | Discharge: 2019-08-20 | DRG: 682 | Disposition: A | Discharge status: Home / Self Care | Payer: Medicaid Other | Attending: Internal Medicine | Admitting: Internal Medicine

## 2019-08-17 ENCOUNTER — Emergency Department (HOSPITAL_COMMUNITY): Payer: Medicaid Other

## 2019-08-17 ENCOUNTER — Other Ambulatory Visit: Payer: Self-pay

## 2019-08-17 DIAGNOSIS — R627 Adult failure to thrive: Secondary | ICD-10-CM | POA: Diagnosis present

## 2019-08-17 DIAGNOSIS — R195 Other fecal abnormalities: Secondary | ICD-10-CM | POA: Diagnosis present

## 2019-08-17 DIAGNOSIS — Z720 Tobacco use: Secondary | ICD-10-CM | POA: Diagnosis not present

## 2019-08-17 DIAGNOSIS — K2100 Gastro-esophageal reflux disease with esophagitis, without bleeding: Secondary | ICD-10-CM | POA: Diagnosis present

## 2019-08-17 DIAGNOSIS — K449 Diaphragmatic hernia without obstruction or gangrene: Secondary | ICD-10-CM | POA: Diagnosis present

## 2019-08-17 DIAGNOSIS — K224 Dyskinesia of esophagus: Secondary | ICD-10-CM | POA: Diagnosis not present

## 2019-08-17 DIAGNOSIS — R4020 Unspecified coma: Secondary | ICD-10-CM | POA: Diagnosis not present

## 2019-08-17 DIAGNOSIS — G9341 Metabolic encephalopathy: Secondary | ICD-10-CM | POA: Diagnosis present

## 2019-08-17 DIAGNOSIS — Z7189 Other specified counseling: Secondary | ICD-10-CM

## 2019-08-17 DIAGNOSIS — Z6822 Body mass index (BMI) 22.0-22.9, adult: Secondary | ICD-10-CM | POA: Diagnosis not present

## 2019-08-17 DIAGNOSIS — K648 Other hemorrhoids: Secondary | ICD-10-CM | POA: Diagnosis present

## 2019-08-17 DIAGNOSIS — D72829 Elevated white blood cell count, unspecified: Secondary | ICD-10-CM | POA: Diagnosis present

## 2019-08-17 DIAGNOSIS — D509 Iron deficiency anemia, unspecified: Secondary | ICD-10-CM | POA: Diagnosis present

## 2019-08-17 DIAGNOSIS — R1310 Dysphagia, unspecified: Secondary | ICD-10-CM | POA: Diagnosis not present

## 2019-08-17 DIAGNOSIS — B3781 Candidal esophagitis: Secondary | ICD-10-CM | POA: Diagnosis present

## 2019-08-17 DIAGNOSIS — Z20822 Contact with and (suspected) exposure to covid-19: Secondary | ICD-10-CM | POA: Diagnosis present

## 2019-08-17 DIAGNOSIS — C3490 Malignant neoplasm of unspecified part of unspecified bronchus or lung: Secondary | ICD-10-CM | POA: Diagnosis present

## 2019-08-17 DIAGNOSIS — Z515 Encounter for palliative care: Secondary | ICD-10-CM | POA: Diagnosis not present

## 2019-08-17 DIAGNOSIS — R531 Weakness: Secondary | ICD-10-CM | POA: Diagnosis not present

## 2019-08-17 DIAGNOSIS — R569 Unspecified convulsions: Secondary | ICD-10-CM | POA: Diagnosis present

## 2019-08-17 DIAGNOSIS — C7931 Secondary malignant neoplasm of brain: Secondary | ICD-10-CM | POA: Diagnosis present

## 2019-08-17 DIAGNOSIS — N179 Acute kidney failure, unspecified: Secondary | ICD-10-CM | POA: Diagnosis not present

## 2019-08-17 DIAGNOSIS — E860 Dehydration: Secondary | ICD-10-CM | POA: Diagnosis present

## 2019-08-17 DIAGNOSIS — Z9221 Personal history of antineoplastic chemotherapy: Secondary | ICD-10-CM | POA: Diagnosis not present

## 2019-08-17 DIAGNOSIS — K219 Gastro-esophageal reflux disease without esophagitis: Secondary | ICD-10-CM | POA: Diagnosis not present

## 2019-08-17 DIAGNOSIS — J439 Emphysema, unspecified: Secondary | ICD-10-CM | POA: Diagnosis present

## 2019-08-17 DIAGNOSIS — F17200 Nicotine dependence, unspecified, uncomplicated: Secondary | ICD-10-CM | POA: Diagnosis present

## 2019-08-17 DIAGNOSIS — E440 Moderate protein-calorie malnutrition: Secondary | ICD-10-CM | POA: Diagnosis present

## 2019-08-17 DIAGNOSIS — R112 Nausea with vomiting, unspecified: Secondary | ICD-10-CM | POA: Diagnosis present

## 2019-08-17 DIAGNOSIS — R109 Unspecified abdominal pain: Secondary | ICD-10-CM | POA: Diagnosis not present

## 2019-08-17 DIAGNOSIS — Z923 Personal history of irradiation: Secondary | ICD-10-CM

## 2019-08-17 DIAGNOSIS — R4182 Altered mental status, unspecified: Secondary | ICD-10-CM | POA: Diagnosis present

## 2019-08-17 DIAGNOSIS — Z79899 Other long term (current) drug therapy: Secondary | ICD-10-CM

## 2019-08-17 DIAGNOSIS — C7951 Secondary malignant neoplasm of bone: Secondary | ICD-10-CM | POA: Diagnosis present

## 2019-08-17 DIAGNOSIS — Z7982 Long term (current) use of aspirin: Secondary | ICD-10-CM

## 2019-08-17 DIAGNOSIS — G8929 Other chronic pain: Secondary | ICD-10-CM | POA: Diagnosis present

## 2019-08-17 DIAGNOSIS — Z91040 Latex allergy status: Secondary | ICD-10-CM

## 2019-08-17 LAB — URINALYSIS, ROUTINE W REFLEX MICROSCOPIC
Bacteria, UA: NONE SEEN
Bilirubin Urine: NEGATIVE
Glucose, UA: NEGATIVE mg/dL
Ketones, ur: NEGATIVE mg/dL
Leukocytes,Ua: NEGATIVE
Nitrite: NEGATIVE
Protein, ur: 30 mg/dL — AB
Specific Gravity, Urine: 1.012 (ref 1.005–1.030)
pH: 5 (ref 5.0–8.0)

## 2019-08-17 LAB — TROPONIN I (HIGH SENSITIVITY)
Troponin I (High Sensitivity): 10 ng/L (ref <18)
Troponin I (High Sensitivity): 8 ng/L (ref <18)

## 2019-08-17 LAB — CBC WITH DIFFERENTIAL/PLATELET
Abs Immature Granulocytes: 0.07 10*3/uL (ref 0.00–0.07)
Basophils Absolute: 0 10*3/uL (ref 0.0–0.1)
Basophils Relative: 0 %
Eosinophils Absolute: 0 10*3/uL (ref 0.0–0.5)
Eosinophils Relative: 0 %
HCT: 26.7 % — ABNORMAL LOW (ref 39.0–52.0)
Hemoglobin: 8.3 g/dL — ABNORMAL LOW (ref 13.0–17.0)
Immature Granulocytes: 1 %
Lymphocytes Relative: 21 %
Lymphs Abs: 3 10*3/uL (ref 0.7–4.0)
MCH: 22.4 pg — ABNORMAL LOW (ref 26.0–34.0)
MCHC: 31.1 g/dL (ref 30.0–36.0)
MCV: 72.2 fL — ABNORMAL LOW (ref 80.0–100.0)
Monocytes Absolute: 0.9 10*3/uL (ref 0.1–1.0)
Monocytes Relative: 6 %
Neutro Abs: 10.5 10*3/uL — ABNORMAL HIGH (ref 1.7–7.7)
Neutrophils Relative %: 72 %
Platelets: 438 10*3/uL — ABNORMAL HIGH (ref 150–400)
RBC: 3.7 MIL/uL — ABNORMAL LOW (ref 4.22–5.81)
RDW: 17.7 % — ABNORMAL HIGH (ref 11.5–15.5)
WBC: 14.6 10*3/uL — ABNORMAL HIGH (ref 4.0–10.5)
nRBC: 0 % (ref 0.0–0.2)

## 2019-08-17 LAB — POC OCCULT BLOOD, ED: Fecal Occult Bld: NEGATIVE

## 2019-08-17 LAB — COMPREHENSIVE METABOLIC PANEL
ALT: 11 U/L (ref 0–44)
AST: 7 U/L — ABNORMAL LOW (ref 15–41)
Albumin: 4.5 g/dL (ref 3.5–5.0)
Alkaline Phosphatase: 71 U/L (ref 38–126)
Anion gap: 14 (ref 5–15)
BUN: 96 mg/dL — ABNORMAL HIGH (ref 6–20)
CO2: 18 mmol/L — ABNORMAL LOW (ref 22–32)
Calcium: 9.7 mg/dL (ref 8.9–10.3)
Chloride: 103 mmol/L (ref 98–111)
Creatinine, Ser: 6.31 mg/dL — ABNORMAL HIGH (ref 0.61–1.24)
GFR calc Af Amer: 11 mL/min — ABNORMAL LOW (ref >60)
GFR calc non Af Amer: 9 mL/min — ABNORMAL LOW (ref >60)
Glucose, Bld: 113 mg/dL — ABNORMAL HIGH (ref 70–99)
Potassium: 4 mmol/L (ref 3.5–5.1)
Sodium: 135 mmol/L (ref 135–145)
Total Bilirubin: 0.4 mg/dL (ref 0.3–1.2)
Total Protein: 9.3 g/dL — ABNORMAL HIGH (ref 6.5–8.1)

## 2019-08-17 LAB — SARS CORONAVIRUS 2 BY RT PCR (HOSPITAL ORDER, PERFORMED IN ~~LOC~~ HOSPITAL LAB): SARS Coronavirus 2: NEGATIVE

## 2019-08-17 MED ORDER — SODIUM CHLORIDE 0.9 % IV BOLUS
1000.0000 mL | Freq: Once | INTRAVENOUS | Status: AC
  Administered 2019-08-17: 1000 mL via INTRAVENOUS

## 2019-08-17 MED ORDER — ONDANSETRON HCL 4 MG/2ML IJ SOLN
4.0000 mg | Freq: Once | INTRAMUSCULAR | Status: DC
  Filled 2019-08-17: qty 2

## 2019-08-17 MED ORDER — PROMETHAZINE HCL 25 MG/ML IJ SOLN
25.0000 mg | Freq: Once | INTRAMUSCULAR | Status: AC
  Administered 2019-08-17: 25 mg via INTRAVENOUS
  Filled 2019-08-17: qty 1

## 2019-08-17 NOTE — Telephone Encounter (Signed)
Vm message  from Pt's wife stating that Pt. Is being taken to [facility] by his daughter. Pt's wife states he has been vomiting, confused, weak, and his balance is off. Tried returning call to number left but the message said phone number not in service.

## 2019-08-17 NOTE — ED Triage Notes (Signed)
Per pt daughter; pt has not been himself for a week and has progressively gotten worse. Pt has not been able to keep solid food down because of n/v and difficulty swallowing. Pt is cancer pt who has completed with chemo and radiation.
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
## 2019-08-18 DIAGNOSIS — C7931 Secondary malignant neoplasm of brain: Secondary | ICD-10-CM

## 2019-08-18 DIAGNOSIS — R112 Nausea with vomiting, unspecified: Secondary | ICD-10-CM

## 2019-08-18 DIAGNOSIS — Z720 Tobacco use: Secondary | ICD-10-CM

## 2019-08-18 DIAGNOSIS — K219 Gastro-esophageal reflux disease without esophagitis: Secondary | ICD-10-CM

## 2019-08-18 DIAGNOSIS — C3490 Malignant neoplasm of unspecified part of unspecified bronchus or lung: Secondary | ICD-10-CM

## 2019-08-18 DIAGNOSIS — N179 Acute kidney failure, unspecified: Principal | ICD-10-CM

## 2019-08-18 LAB — COMPREHENSIVE METABOLIC PANEL
ALT: 9 U/L (ref 0–44)
AST: 10 U/L — ABNORMAL LOW (ref 15–41)
Albumin: 3.4 g/dL — ABNORMAL LOW (ref 3.5–5.0)
Alkaline Phosphatase: 62 U/L (ref 38–126)
Anion gap: 12 (ref 5–15)
BUN: 67 mg/dL — ABNORMAL HIGH (ref 6–20)
CO2: 16 mmol/L — ABNORMAL LOW (ref 22–32)
Calcium: 9 mg/dL (ref 8.9–10.3)
Chloride: 112 mmol/L — ABNORMAL HIGH (ref 98–111)
Creatinine, Ser: 2.34 mg/dL — ABNORMAL HIGH (ref 0.61–1.24)
GFR calc Af Amer: 36 mL/min — ABNORMAL LOW (ref >60)
GFR calc non Af Amer: 31 mL/min — ABNORMAL LOW (ref >60)
Glucose, Bld: 88 mg/dL (ref 70–99)
Potassium: 3.7 mmol/L (ref 3.5–5.1)
Sodium: 140 mmol/L (ref 135–145)
Total Bilirubin: 0.7 mg/dL (ref 0.3–1.2)
Total Protein: 7.4 g/dL (ref 6.5–8.1)

## 2019-08-18 LAB — CBC
HCT: 22 % — ABNORMAL LOW (ref 39.0–52.0)
Hemoglobin: 7 g/dL — ABNORMAL LOW (ref 13.0–17.0)
MCH: 22.8 pg — ABNORMAL LOW (ref 26.0–34.0)
MCHC: 31.8 g/dL (ref 30.0–36.0)
MCV: 71.7 fL — ABNORMAL LOW (ref 80.0–100.0)
Platelets: 396 10*3/uL (ref 150–400)
RBC: 3.07 MIL/uL — ABNORMAL LOW (ref 4.22–5.81)
RDW: 17.2 % — ABNORMAL HIGH (ref 11.5–15.5)
WBC: 10.2 10*3/uL (ref 4.0–10.5)
nRBC: 0 % (ref 0.0–0.2)

## 2019-08-18 LAB — PHOSPHORUS: Phosphorus: 4.1 mg/dL (ref 2.5–4.6)

## 2019-08-18 MED ORDER — FERROUS SULFATE 325 (65 FE) MG PO TABS
325.0000 mg | ORAL_TABLET | Freq: Every day | ORAL | Status: DC
  Administered 2019-08-18 – 2019-08-20 (×3): 325 mg via ORAL
  Filled 2019-08-18 (×3): qty 1

## 2019-08-18 MED ORDER — SODIUM BICARBONATE 8.4 % IV SOLN
INTRAVENOUS | Status: DC
  Filled 2019-08-18 (×4): qty 100

## 2019-08-18 MED ORDER — THIAMINE HCL 100 MG PO TABS
100.0000 mg | ORAL_TABLET | Freq: Every day | ORAL | Status: DC
  Administered 2019-08-18 – 2019-08-20 (×4): 100 mg via ORAL
  Filled 2019-08-18 (×2): qty 1

## 2019-08-18 MED ORDER — LEVETIRACETAM 500 MG PO TABS
2000.0000 mg | ORAL_TABLET | Freq: Two times a day (BID) | ORAL | Status: DC
  Administered 2019-08-18 – 2019-08-20 (×6): 2000 mg via ORAL
  Filled 2019-08-18 (×6): qty 4

## 2019-08-18 MED ORDER — CAMPHOR-MENTHOL 0.5-0.5 % EX LOTN
1.0000 "application " | TOPICAL_LOTION | Freq: Three times a day (TID) | CUTANEOUS | Status: DC | PRN
  Filled 2019-08-18: qty 222

## 2019-08-18 MED ORDER — KATE FARMS STANDARD 1.4 PO LIQD
325.0000 mL | Freq: Two times a day (BID) | ORAL | Status: DC
  Administered 2019-08-19: 325 mL via ORAL
  Filled 2019-08-18 (×5): qty 325

## 2019-08-18 MED ORDER — OXYCODONE HCL 5 MG PO TABS
15.0000 mg | ORAL_TABLET | ORAL | Status: DC | PRN
  Administered 2019-08-18 – 2019-08-20 (×7): 15 mg via ORAL
  Filled 2019-08-18 (×7): qty 3

## 2019-08-18 MED ORDER — FOLIC ACID 1 MG PO TABS
1.0000 mg | ORAL_TABLET | Freq: Every day | ORAL | Status: DC
  Administered 2019-08-18 – 2019-08-20 (×3): 1 mg via ORAL
  Filled 2019-08-18 (×3): qty 1

## 2019-08-18 MED ORDER — BACLOFEN 10 MG PO TABS
10.0000 mg | ORAL_TABLET | Freq: Two times a day (BID) | ORAL | Status: DC
  Administered 2019-08-18 – 2019-08-20 (×6): 10 mg via ORAL
  Filled 2019-08-18 (×6): qty 1

## 2019-08-18 MED ORDER — SODIUM CHLORIDE 0.9% FLUSH
10.0000 mL | INTRAVENOUS | Status: DC | PRN
  Administered 2019-08-20: 10 mL

## 2019-08-18 MED ORDER — PANTOPRAZOLE SODIUM 40 MG PO TBEC
40.0000 mg | DELAYED_RELEASE_TABLET | Freq: Two times a day (BID) | ORAL | Status: DC
  Administered 2019-08-18 – 2019-08-19 (×4): 40 mg via ORAL
  Filled 2019-08-18 (×4): qty 1

## 2019-08-18 MED ORDER — PHENOL 1.4 % MT LIQD
1.0000 | OROMUCOSAL | Status: DC | PRN
  Administered 2019-08-18: 1 via OROMUCOSAL
  Filled 2019-08-18: qty 177

## 2019-08-18 MED ORDER — ONDANSETRON HCL 4 MG PO TABS: 4.0000 mg | ORAL_TABLET | Freq: Four times a day (QID) | ORAL | Status: DC | PRN

## 2019-08-18 MED ORDER — ACETAMINOPHEN 325 MG PO TABS: 650.0000 mg | ORAL_TABLET | Freq: Four times a day (QID) | ORAL | Status: DC | PRN

## 2019-08-18 MED ORDER — SORBITOL 70 % SOLN: 30.0000 mL | Status: DC | PRN

## 2019-08-18 MED ORDER — CALCIUM CARBONATE ANTACID 1250 MG/5ML PO SUSP
500.0000 mg | Freq: Four times a day (QID) | ORAL | Status: DC | PRN
  Filled 2019-08-18: qty 5

## 2019-08-18 MED ORDER — DOCUSATE SODIUM 283 MG RE ENEM
1.0000 | ENEMA | RECTAL | Status: DC | PRN
  Filled 2019-08-18: qty 1

## 2019-08-18 MED ORDER — BOOST / RESOURCE BREEZE PO LIQD CUSTOM
1.0000 | ORAL | Status: DC
  Administered 2019-08-19 – 2019-08-20 (×2): 1 via ORAL

## 2019-08-18 MED ORDER — HYDROXYZINE HCL 25 MG PO TABS: 25.0000 mg | ORAL_TABLET | Freq: Three times a day (TID) | ORAL | Status: DC | PRN

## 2019-08-18 MED ORDER — SODIUM CHLORIDE 0.9 % IV SOLN: INTRAVENOUS | Status: DC

## 2019-08-18 MED ORDER — GABAPENTIN 400 MG PO CAPS
800.0000 mg | ORAL_CAPSULE | Freq: Three times a day (TID) | ORAL | Status: DC
  Administered 2019-08-18 – 2019-08-20 (×7): 800 mg via ORAL
  Filled 2019-08-18 (×7): qty 2

## 2019-08-18 MED ORDER — ZOLPIDEM TARTRATE 5 MG PO TABS
5.0000 mg | ORAL_TABLET | Freq: Every evening | ORAL | Status: DC | PRN
  Administered 2019-08-18: 5 mg via ORAL
  Filled 2019-08-18: qty 1

## 2019-08-18 MED ORDER — ACETAMINOPHEN 650 MG RE SUPP: 650.0000 mg | Freq: Four times a day (QID) | RECTAL | Status: DC | PRN

## 2019-08-18 MED ORDER — NEPRO/CARBSTEADY PO LIQD
237.0000 mL | Freq: Three times a day (TID) | ORAL | Status: DC | PRN
  Filled 2019-08-18: qty 237

## 2019-08-18 MED ORDER — ONDANSETRON HCL 4 MG/2ML IJ SOLN
4.0000 mg | Freq: Four times a day (QID) | INTRAMUSCULAR | Status: DC | PRN
  Administered 2019-08-19: 4 mg via INTRAVENOUS
  Filled 2019-08-18: qty 2

## 2019-08-18 MED ORDER — HEPARIN SODIUM (PORCINE) 5000 UNIT/ML IJ SOLN
5000.0000 [IU] | Freq: Three times a day (TID) | INTRAMUSCULAR | Status: DC
  Administered 2019-08-18 – 2019-08-20 (×6): 5000 [IU] via SUBCUTANEOUS
  Filled 2019-08-18 (×4): qty 1

## 2019-08-18 MED ORDER — CHLORHEXIDINE GLUCONATE CLOTH 2 % EX PADS
6.0000 | MEDICATED_PAD | Freq: Every day | CUTANEOUS | Status: DC
  Administered 2019-08-18 – 2019-08-20 (×3): 6 via TOPICAL

## 2019-08-18 NOTE — Progress Notes (Signed)
Patient ID: Eric Mason, male   DOB: 06/03/1970, 49 y.o.   MRN: [id number] Patient was admitted early this morning for altered mental status and was found to have acute kidney injury with creatinine of 6.31.  He was started on IV fluids.  Patient seen and examined at bedside and plan of care discussed with him.  He is much more awake and responsive.  States that he is hungry and wants to eat.  I have reviewed patient's medical records including this morning's H&P, current vitals, labs and medications myself.  Creatinine has improved to 2.34 this morning.  Bicarb is 16.  We will switch to bicarb drip for 24 hours.  We will put him on a regular diet.  I have added Dr. Alen Blew to the care teams.  Palliative care consult for goals of care discussion.  Hemoglobin is 7.  No signs of bleeding.  Transfuse if hemoglobin is less than 7.  Repeat a.m. labs.
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
## 2019-08-18 NOTE — Progress Notes (Signed)
Initial Nutrition Assessment  DOCUMENTATION CODES:   Not applicable  INTERVENTION:  - will order Boost Breeze once/day, each supplement provides 250 kcal and 9 grams of protein. - will order [employer] BID, each supplement provides 455 kcal and 20 grams protein. - will order Magic Cup with dinner meals, each supplement provides 290 kcal and 9 grams of protein. - will order 1 tablet multivitamin with minerals/day.    NUTRITION DIAGNOSIS:   Moderate Malnutrition related to chronic illness, catabolic illness, cancer and cancer related treatments as evidenced by moderate fat depletion, moderate muscle depletion.  GOAL:   Patient will meet greater than or equal to 90% of their needs  MONITOR:   PO intake, Supplement acceptance, Labs, Weight trends  REASON FOR ASSESSMENT:   Malnutrition Screening Tool  ASSESSMENT:   49 y.o. male with medical history of metastatic lung cancer with bone and brain mets and traumatic pneumothorax. He presented to the ED due to progressive confusion x1 week. Notes from the ED indicate he was having vomiting episodes PTA and that he was having difficulty eating and drinking because of this.  Diet advanced from NPO to Regular today at 0920. Patient reports that he ate breakfast: grits, toast, and scrambled eggs and that he was able to eat without issue. Lunch tray was delivered a short time before RD visit and patient had not yet started to eat. He stated he would try to eat as well as he could.   Discussion with patient was short as he needed to use the restroom at the first attempted visit and received a phone call during the second visit.   He states that his stomach is not bothering him today and he is not having any abdominal pain or nausea. His appetite has been decreased/poor at home. He often prepares foods for himself while his wife is not home.   Per chart review, weight yesterday was documented as 160 lb and weight on 4/9 was 166 lb. This  indicates 6 lb weight loss (3.6% body weight) in the past 2 months; not significant for time frame.    Labs reviewed; Cl: 112 mmol/l, BUN: 67 mg/dl, creatinine: 2.34 mg/dl. Medications reviewed; 325 mg ferrous sulfate/day, 1 mg folvite/day, 100 mg thiamine/day. IVF; D5-150 mEq sodium bicarb @ 125 ml/hr (510 kcal).      NUTRITION - FOCUSED PHYSICAL EXAM:    Most Recent Value  Orbital Region  Mild depletion  Upper Arm Region  Moderate depletion  Thoracic and Lumbar Region  Unable to assess  Buccal Region  Moderate depletion  Temple Region  Mild depletion  Clavicle Bone Region  Moderate depletion  Clavicle and Acromion Bone Region  Moderate depletion  Scapular Bone Region  Mild depletion  Dorsal Hand  Mild depletion  Patellar Region  Unable to assess  Anterior Thigh Region  Unable to assess  Posterior Calf Region  Unable to assess  Edema (RD Assessment)  Unable to assess  Hair  Reviewed  Eyes  Reviewed  Mouth  Unable to assess  Skin  Reviewed  Nails  Reviewed       Diet Order:   Diet Order            Diet regular Room service appropriate? Yes; Fluid consistency: Thin  Diet effective now              EDUCATION NEEDS:   No education needs have been identified at this time  Skin:  Skin Assessment: Reviewed RN Assessment  Last BM:  PTA/unknown  Height:   Ht Readings from Last 1 Encounters:  08/17/19 5' 10.5" (1.791 m)    Weight:   Wt Readings from Last 1 Encounters:  08/17/19 72.6 kg    Estimated Nutritional Needs:  Kcal:  2400-2600 kcal Protein:  120-135 grams Fluid:  >/= 2.4 L/day     Jarome Matin, MS, RD, LDN, CNSC Inpatient Clinical Dietitian RD pager # available in AMION  After hours/weekend pager # available in [facility]

## 2019-08-19 ENCOUNTER — Encounter (HOSPITAL_COMMUNITY): Payer: Self-pay | Admitting: Internal Medicine

## 2019-08-19 DIAGNOSIS — D509 Iron deficiency anemia, unspecified: Secondary | ICD-10-CM

## 2019-08-19 DIAGNOSIS — R1310 Dysphagia, unspecified: Secondary | ICD-10-CM

## 2019-08-19 DIAGNOSIS — Z515 Encounter for palliative care: Secondary | ICD-10-CM

## 2019-08-19 DIAGNOSIS — Z7189 Other specified counseling: Secondary | ICD-10-CM

## 2019-08-19 LAB — CBC WITH DIFFERENTIAL/PLATELET
Abs Immature Granulocytes: 0.03 10*3/uL (ref 0.00–0.07)
Basophils Absolute: 0 10*3/uL (ref 0.0–0.1)
Basophils Relative: 0 %
Eosinophils Absolute: 0 10*3/uL (ref 0.0–0.5)
Eosinophils Relative: 0 %
HCT: 20.1 % — ABNORMAL LOW (ref 39.0–52.0)
Hemoglobin: 6.2 g/dL — CL (ref 13.0–17.0)
Immature Granulocytes: 0 %
Lymphocytes Relative: 50 %
Lymphs Abs: 4.5 10*3/uL — ABNORMAL HIGH (ref 0.7–4.0)
MCH: 22.1 pg — ABNORMAL LOW (ref 26.0–34.0)
MCHC: 30.8 g/dL (ref 30.0–36.0)
MCV: 71.8 fL — ABNORMAL LOW (ref 80.0–100.0)
Monocytes Absolute: 0.7 10*3/uL (ref 0.1–1.0)
Monocytes Relative: 7 %
Neutro Abs: 3.9 10*3/uL (ref 1.7–7.7)
Neutrophils Relative %: 43 %
Platelets: 341 10*3/uL (ref 150–400)
RBC: 2.8 MIL/uL — ABNORMAL LOW (ref 4.22–5.81)
RDW: 17.2 % — ABNORMAL HIGH (ref 11.5–15.5)
WBC: 9.1 10*3/uL (ref 4.0–10.5)
nRBC: 0 % (ref 0.0–0.2)

## 2019-08-19 LAB — COMPREHENSIVE METABOLIC PANEL
ALT: 12 U/L (ref 0–44)
AST: 14 U/L — ABNORMAL LOW (ref 15–41)
Albumin: 3.2 g/dL — ABNORMAL LOW (ref 3.5–5.0)
Alkaline Phosphatase: 60 U/L (ref 38–126)
Anion gap: 9 (ref 5–15)
BUN: 27 mg/dL — ABNORMAL HIGH (ref 6–20)
CO2: 22 mmol/L (ref 22–32)
Calcium: 8.6 mg/dL — ABNORMAL LOW (ref 8.9–10.3)
Chloride: 107 mmol/L (ref 98–111)
Creatinine, Ser: 1.04 mg/dL (ref 0.61–1.24)
GFR calc Af Amer: >60 mL/min (ref >60)
GFR calc non Af Amer: >60 mL/min (ref >60)
Glucose, Bld: 91 mg/dL (ref 70–99)
Potassium: 3.4 mmol/L — ABNORMAL LOW (ref 3.5–5.1)
Sodium: 138 mmol/L (ref 135–145)
Total Bilirubin: 0.6 mg/dL (ref 0.3–1.2)
Total Protein: 7 g/dL (ref 6.5–8.1)

## 2019-08-19 LAB — FOLATE: Folate: 14.9 ng/mL (ref >5.9)

## 2019-08-19 LAB — FERRITIN: Ferritin: 39 ng/mL (ref 24–336)

## 2019-08-19 LAB — IRON AND TIBC
Iron: 19 ug/dL — ABNORMAL LOW (ref 45–182)
Saturation Ratios: 5 % — ABNORMAL LOW (ref 17.9–39.5)
TIBC: 362 ug/dL (ref 250–450)
UIBC: 343 ug/dL

## 2019-08-19 LAB — VITAMIN B12: Vitamin B-12: 722 pg/mL (ref 180–914)

## 2019-08-19 LAB — ABO/RH: ABO/RH(D): O POS

## 2019-08-19 LAB — RETICULOCYTES
Immature Retic Fract: 14.1 % (ref 2.3–15.9)
RBC.: 2.76 MIL/uL — ABNORMAL LOW (ref 4.22–5.81)
Retic Count, Absolute: 17.9 10*3/uL — ABNORMAL LOW (ref 19.0–186.0)
Retic Ct Pct: 0.7 % (ref 0.4–3.1)

## 2019-08-19 LAB — PREPARE RBC (CROSSMATCH)

## 2019-08-19 LAB — MAGNESIUM: Magnesium: 1.9 mg/dL (ref 1.7–2.4)

## 2019-08-19 MED ORDER — SODIUM CHLORIDE 0.9 % IV SOLN
510.0000 mg | Freq: Once | INTRAVENOUS | Status: AC
  Administered 2019-08-19: 510 mg via INTRAVENOUS
  Filled 2019-08-19: qty 510

## 2019-08-19 MED ORDER — PROMETHAZINE HCL 25 MG/ML IJ SOLN
12.5000 mg | Freq: Four times a day (QID) | INTRAMUSCULAR | Status: DC | PRN
  Administered 2019-08-19: 12.5 mg via INTRAVENOUS
  Filled 2019-08-19: qty 1

## 2019-08-19 MED ORDER — PANTOPRAZOLE SODIUM 40 MG IV SOLR
40.0000 mg | INTRAVENOUS | Status: DC
  Administered 2019-08-20: 40 mg via INTRAVENOUS
  Filled 2019-08-19: qty 40

## 2019-08-19 MED ORDER — SODIUM CHLORIDE 0.9% IV SOLUTION: Freq: Once | INTRAVENOUS | Status: AC

## 2019-08-19 MED ORDER — FLUCONAZOLE IN SODIUM CHLORIDE 400-0.9 MG/200ML-% IV SOLN
400.0000 mg | Freq: Once | INTRAVENOUS | Status: AC
  Administered 2019-08-19: 400 mg via INTRAVENOUS
  Filled 2019-08-19: qty 200

## 2019-08-19 MED ORDER — SODIUM CHLORIDE 0.9% IV SOLUTION: Freq: Once | INTRAVENOUS | Status: DC

## 2019-08-19 MED ORDER — FLUCONAZOLE 100 MG PO TABS
200.0000 mg | ORAL_TABLET | Freq: Every day | ORAL | Status: DC
  Administered 2019-08-20: 200 mg via ORAL
  Filled 2019-08-19: qty 2

## 2019-08-19 NOTE — Plan of Care (Signed)

## 2019-08-19 NOTE — Progress Notes (Signed)
PT Cancellation Note  Patient Details Name: Eric Mason MRN: [id number] DOB: 1970/11/03   Cancelled Treatment:    Reason Eval/Treat Not Completed: Medical issues which prohibited therapy, HGB 6.2, to get 2 units will check back after blood as schedule allows.   Claretha Cooper 08/19/2019, 7:42 AM [place] Pager [phone] Office [phone]

## 2019-08-19 NOTE — Progress Notes (Signed)
Chaplain visited patient.  "I'm not having a good day....spilled by food all over myself. And then had problems in the bathroom." Patient then explained his wife Eric Mason is at [facility].  "She's been there with a varient.of MRSA that called [facility].  Doctors say they can make her better but they can't."  They talk every day.  Patient says he has been ill since  2018.  Chaplain offered ministry of presence and prayer. When asked how he was dealing with all this, he said "Eric Mason has been dealing with this better than me....she's strong like her mother. Chaplain will try to follow up if patient is here Friday. Rev. Tamsen Snider

## 2019-08-19 NOTE — Progress Notes (Signed)
Critical Lab Hemoglobin 6.2 Provider Notified
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
## 2019-08-19 NOTE — Progress Notes (Signed)
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  Patient was having poor oral intake, multiple episode of vomiting, diarrhea.  On presentation he was hypotensive, tachycardic.  He had leukocytosis.  Hemoglobin of 8.3.  He was found to have severe AKI with creatinine of 6.31.  FOBT was negative.  CT abdomen/pelvis did not show any acute abnormalities.  Head CT showed hypodensity in the left insula consistent with known metastatic disease. Patient reported black tarry stools today.  His hemoglobin dropped to the range of 6 and was transfused units of PRBC.  Complains of throat pain.GI consulted for strong suspicion for upper GI bleed/esophagitis.  Assessment & Plan:   Principal Problem:   ARF (acute renal failure) (HCC) Active Problems:   Brain metastasis (HCC)   Tobacco abuse   Gastroesophageal reflux disease   Lung cancer (HCC)   Intractable nausea and vomiting   Bone metastasis (HCC)   Goals of care, counseling/discussion   Palliative care by specialist   Altered mental status: Most likely secondary to metabolic encephalopathy from severe uremia from acute kidney injury and metabolic derangements.  Currently alert and oriented.  AKI: Patient had AKI due to decreased oral intake, dehydration.  AKI resolved with IV fluids.  Microcytic anemia/Iron deficiency anemia: FOBT negative.  Hb dropped to 6.2 today.He reported large volume black tarry stool today.Iron studies showed low iron.  He has been transfused with 2 units of PRBC today.  Also will be given IV iron.  Continue iron supplements on discharge. I have requested for  GI consultation.  Patient had a EGD endoscopy procedure done for same reason in May this year at [facility] without finding of any bleeding source.  At that time he was recommended to have a small bowel evaluation but he declined. Started on PPI IV.  Odynophagia: He gives history of esophageal candidiasis.  He was taking nystatin at home.  Will start on fluconazole.  Complains of burning pain while swallowing food.  Metastatic lung cancer: Metastasis to bones and brain.  Follows with oncology.  Oncology will follow here.  Palliative care consulted for goals of care discussion.  Patient wants to continue full scope of treatment and not interested to discuss about goals of care.  Intractable nausea/vomiting: Continue IV fluids, antiemetics.  Is largely arising from his cancer.  GERD: Continue PPI  Tobacco abuse: Continue nicotine patch  Failure to thrive: Severe malnutrition secondary to malignancy.  He has severe anorexia.  Dietitian following.  Nutrition Problem: Moderate Malnutrition Etiology: chronic illness, catabolic illness, cancer and cancer related treatments      DVT prophylaxis:Heparin [place] Code Status: Full Family Communication: None  Status is: Inpatient  Remains inpatient appropriate because:Inpatient level of care appropriate due to severity of illness   Dispo: The patient is from: Home              Anticipated d/c is to: Home              Anticipated d/c date is: 2 days              Patient currently is not medically stable to d/c.    Consultants: Palliative care,oncology  Procedures:None  Antimicrobials:  Anti-infectives (From admission, onward)   None      Subjective: Patient seen and examined the bedside this afternoon.  Hemodynamically stable.  Currently alert and oriented.  Complains of pain on odynophagia.  He reported large volume black tarry stool this morning.  Objective: Vitals:   08/19/19 0456 08/19/19 0458 08/19/19 1310 08/19/19 1335   BP: 95/61  110/80 114/67  Pulse: 82  72 64  Resp: 18  18 18   Temp: 100 F (37.8 C)  99.2 F (37.3 C) 99.1 F (37.3 C)  TempSrc:   Oral Oral  SpO2: 96%  100% 99%  Weight:  71.2 kg    Height:        Intake/Output Summary (Last 24 hours) at 08/19/2019 1441 Last data filed at 08/19/2019 0900 Gross per 24 hour  Intake 633.77 ml  Output --  Net 633.77 ml   Filed Weights   08/17/19 1515 08/19/19 0458  Weight: 72.6 kg 71.2 kg    Examination:  General exam: Appears calm and comfortable ,Not in distress,average built HEENT:PERRL,Oral mucosa moist, Ear/Nose normal on gross exam Respiratory system: Bilateral equal air entry, normal vesicular breath sounds, no wheezes or crackles  Cardiovascular system: S1 & S2 heard, RRR. No JVD, murmurs, rubs, gallops or clicks. No pedal edema.  Chemo-Port on the right chest Gastrointestinal system: Abdomen is nondistended, soft and has mild generalized tenderness. No organomegaly or masses felt. Normal bowel sounds heard. Central nervous system: Alert and oriented. No focal neurological deficits. Extremities: No edema, no clubbing ,no cyanosis, distal peripheral pulses palpable. Skin: No rashes, lesions or ulcers,no icterus ,no pallor   Data Reviewed: I have personally reviewed following labs and imaging studies  CBC: Recent Labs  Lab 08/17/19 1631 08/18/19 0545 08/19/19 0308  WBC 14.6* 10.2 9.1  NEUTROABS 10.5*  --  3.9  HGB 8.3* 7.0* 6.2*  HCT 26.7* 22.0* 20.1*  MCV 72.2* 71.7* 71.8*  PLT 438* 396 741   Basic Metabolic Panel: Recent Labs  Lab 08/17/19 1631 08/18/19 0545 08/19/19 0308  NA 135 140 138  K 4.0 3.7 3.4*  CL 103 112* 107  CO2 18* 16* 22  GLUCOSE 113* 88 91  BUN 96* 67* 27*  CREATININE 6.31* 2.34* 1.04  CALCIUM 9.7 9.0 8.6*  MG  --   --  1.9  PHOS  --  4.1  --    GFR: Estimated Creatinine Clearance: 86.5 mL/min (by C-G formula based on SCr of 1.04 mg/dL). Liver Function Tests: Recent Labs  Lab 08/17/19 1631  08/18/19 0545 08/19/19 0308  AST 7* 10* 14*  ALT 11 9 12   ALKPHOS 71 62 60  BILITOT 0.4 0.7 0.6  PROT 9.3* 7.4 7.0  ALBUMIN 4.5 3.4* 3.2*   No results for input(s): LIPASE, AMYLASE in the last 168 hours. No results for input(s): AMMONIA in the last 168 hours. Coagulation Profile: No results for input(s): INR, PROTIME in the last 168 hours. Cardiac Enzymes: No results for input(s): CKTOTAL, CKMB, CKMBINDEX, TROPONINI in the last 168 hours. BNP (last 3 results) No results for input(s): PROBNP in the last 8760 hours. HbA1C: No results for input(s): HGBA1C in the last 72 hours. CBG: No results for input(s): GLUCAP in the last 168 hours. Lipid Profile: No results for input(s): CHOL, HDL, LDLCALC, TRIG, CHOLHDL, LDLDIRECT in the last 72 hours. Thyroid Function Tests: No results for input(s): TSH, T4TOTAL, FREET4, T3FREE, THYROIDAB in the last 72 hours. Anemia Panel: Recent Labs    08/19/19  8099 08/19/19 0853  VITAMINB12  --  722  FOLATE  --  14.9  FERRITIN  --  39  TIBC  --  362  IRON  --  19*  RETICCTPCT 0.7  --    Sepsis Labs: No results for input(s): PROCALCITON, LATICACIDVEN in the last 168 hours.  Recent Results (from the past 240 hour(s))  SARS Coronavirus 2 by RT PCR (hospital order, performed in [facility] hospital lab) Nasopharyngeal Nasopharyngeal Swab     Status: None   Collection Time: 08/17/19  8:43 PM   Specimen: Nasopharyngeal Swab  Result Value Ref Range Status   SARS Coronavirus 2 NEGATIVE NEGATIVE Final    Comment: (NOTE) SARS-CoV-2 target nucleic acids are NOT DETECTED. The SARS-CoV-2 RNA is generally detectable in upper and lower respiratory specimens during the acute phase of infection. The lowest concentration of SARS-CoV-2 viral copies this assay can detect is 250 copies / mL. A negative result does not preclude SARS-CoV-2 infection and should not be used as the sole basis for treatment or other patient management decisions.  A negative result  may occur with improper specimen collection / handling, submission of specimen other than nasopharyngeal swab, presence of viral mutation(s) within the areas targeted by this assay, and inadequate number of viral copies (<250 copies / mL). A negative result must be combined with clinical observations, patient history, and epidemiological information. Fact Sheet for Patients:   StrictlyIdeas.no Fact Sheet for Healthcare Providers: BankingDealers.co.za This test is not yet approved or cleared  by the [place] FDA and has been authorized for detection and/or diagnosis of SARS-CoV-2 by FDA under an Emergency Use Authorization (EUA).  This EUA will remain in effect (meaning this test can be used) for the duration of the COVID-19 declaration under Section 564(b)(1) of the Act, 21 U.S.C. section 360bbb-3(b)(1), unless the authorization is terminated or revoked sooner. Performed at [facility], [place] [address] [place], [place] [address]          Radiology Studies: CT ABDOMEN PELVIS WO CONTRAST  Result Date: 08/17/2019 CLINICAL DATA:  Abdominal pain.  History of metastatic lung cancer. EXAM: CT ABDOMEN AND PELVIS WITHOUT CONTRAST TECHNIQUE: Multidetector CT imaging of the abdomen and pelvis was performed following the standard protocol without IV contrast. COMPARISON:  CT dated February 03, 2019. FINDINGS: Lower chest: Emphysematous changes are again noted. There is some chronic scarring and atelectasis in the right middle lobe and lingula.The heart size is normal. Hepatobiliary: The liver is normal. Normal gallbladder.There is no biliary ductal dilation. Pancreas: Normal contours without ductal dilatation. No peripancreatic fluid collection. Spleen: Unremarkable. Adrenals/Urinary Tract: --Adrenal glands: Unremarkable. --Right kidney/ureter: No hydronephrosis or radiopaque kidney stones. --Left kidney/ureter: No hydronephrosis  or radiopaque kidney stones. --Urinary bladder: Unremarkable. Stomach/Bowel: --Stomach/Duodenum: No hiatal hernia or other gastric abnormality. Normal duodenal course and caliber. --Small bowel: Unremarkable. --Colon: Unremarkable. --Appendix: Normal. Vascular/Lymphatic: Atherosclerotic calcification is present within the non-aneurysmal abdominal aorta, without hemodynamically significant stenosis. --No retroperitoneal lymphadenopathy. --No mesenteric lymphadenopathy. --No pelvic or inguinal lymphadenopathy. Reproductive: Unremarkable Other: No ascites or free air. The abdominal wall is normal. Musculoskeletal. No acute displaced fractures. IMPRESSION: No acute abnormality.  Chronic findings as detailed above. Electronically Signed   By: Constance Holster M.D.   On: 08/17/2019 20:05   CT Head Wo Contrast  Result Date: 08/17/2019 CLINICAL DATA:  Altered level of consciousness, history of metastatic lung cancer with brain metastases EXAM: CT HEAD WITHOUT CONTRAST TECHNIQUE: Contiguous axial images were obtained from the base of  the skull through the vertex without intravenous contrast. COMPARISON:  06/10/2019 FINDINGS: Brain: Hypodensity within the insula, with areas of cortical calcification, compatible with known intracranial metastatic disease. Please refer to recent MRI findings. Postsurgical changes are seen from right parietal craniotomy with underlying encephalomalacia, compatible with prior resection of metastatic disease. No acute infarct or hemorrhage. Lateral ventricles and midline structures are unremarkable. No acute extra-axial fluid collections. Vascular: No hyperdense vessel or unexpected calcification. Skull: Postsurgical changes from right parietal craniotomy. No acute bony abnormalities. Sinuses/Orbits: No acute finding. Other: None. IMPRESSION: 1. Hypodensity left insula consistent with known intracranial metastatic disease. Please refer to prior MRI findings. 2. No acute infarct or hemorrhage.  Electronically Signed   By: Randa Ngo M.D.   On: 08/17/2019 20:04        Scheduled Meds:  sodium chloride   Intravenous Once   baclofen  10 mg Oral BID   Chlorhexidine Gluconate Cloth  6 each Topical Daily   feeding supplement  1 Container Oral Q24H   feeding supplement (KATE FARMS STANDARD 1.4)  325 mL Oral BID BM   ferrous sulfate  325 mg Oral Daily   folic acid  1 mg Oral Daily   gabapentin  800 mg Oral TID   heparin  5,000 Units Subcutaneous Q8H   levETIRAcetam  2,000 mg Oral BID   pantoprazole  40 mg Oral BID   thiamine  100 mg Oral Daily   Continuous Infusions:  ferumoxytol      sodium bicarbonate  infusion 1000 mL 125 mL/hr at 08/18/19 1239     LOS: 2 days    Time spent: 35 mins.More than 50% of that time was spent in counseling and/or coordination of care.      Shelly Coss, MD [facility] P6/11/2019, 2:41 PM
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
## 2019-08-20 ENCOUNTER — Inpatient Hospital Stay (HOSPITAL_COMMUNITY): Payer: Medicaid Other

## 2019-08-20 ENCOUNTER — Other Ambulatory Visit: Payer: Self-pay | Admitting: *Deleted

## 2019-08-20 DIAGNOSIS — Z95828 Presence of other vascular implants and grafts: Secondary | ICD-10-CM

## 2019-08-20 LAB — TYPE AND SCREEN
ABO/RH(D): O POS
Antibody Screen: NEGATIVE
Unit division: 0
Unit division: 0

## 2019-08-20 LAB — BASIC METABOLIC PANEL
Anion gap: 10 (ref 5–15)
BUN: 8 mg/dL (ref 6–20)
CO2: 22 mmol/L (ref 22–32)
Calcium: 9.1 mg/dL (ref 8.9–10.3)
Chloride: 106 mmol/L (ref 98–111)
Creatinine, Ser: 0.8 mg/dL (ref 0.61–1.24)
GFR calc Af Amer: >60 mL/min (ref >60)
GFR calc non Af Amer: >60 mL/min (ref >60)
Glucose, Bld: 90 mg/dL (ref 70–99)
Potassium: 3.7 mmol/L (ref 3.5–5.1)
Sodium: 138 mmol/L (ref 135–145)

## 2019-08-20 LAB — CBC WITH DIFFERENTIAL/PLATELET
Abs Immature Granulocytes: 0.04 10*3/uL (ref 0.00–0.07)
Basophils Absolute: 0 10*3/uL (ref 0.0–0.1)
Basophils Relative: 0 %
Eosinophils Absolute: 0.1 10*3/uL (ref 0.0–0.5)
Eosinophils Relative: 1 %
HCT: 28.1 % — ABNORMAL LOW (ref 39.0–52.0)
Hemoglobin: 8.8 g/dL — ABNORMAL LOW (ref 13.0–17.0)
Immature Granulocytes: 0 %
Lymphocytes Relative: 51 %
Lymphs Abs: 5 10*3/uL — ABNORMAL HIGH (ref 0.7–4.0)
MCH: 24 pg — ABNORMAL LOW (ref 26.0–34.0)
MCHC: 31.3 g/dL (ref 30.0–36.0)
MCV: 76.8 fL — ABNORMAL LOW (ref 80.0–100.0)
Monocytes Absolute: 0.6 10*3/uL (ref 0.1–1.0)
Monocytes Relative: 6 %
Neutro Abs: 4.2 10*3/uL (ref 1.7–7.7)
Neutrophils Relative %: 42 %
Platelets: 351 10*3/uL (ref 150–400)
RBC: 3.66 MIL/uL — ABNORMAL LOW (ref 4.22–5.81)
RDW: 18.6 % — ABNORMAL HIGH (ref 11.5–15.5)
WBC: 9.9 10*3/uL (ref 4.0–10.5)
nRBC: 0 % (ref 0.0–0.2)

## 2019-08-20 LAB — BPAM RBC
Blood Product Expiration Date: 202107092359
Blood Product Expiration Date: 202107092359
ISSUE DATE / TIME: 202106091317
ISSUE DATE / TIME: 202106092340
Unit Type and Rh: 5100
Unit Type and Rh: 5100

## 2019-08-20 MED ORDER — PANTOPRAZOLE SODIUM 40 MG IV SOLR: 40.0000 mg | Freq: Two times a day (BID) | INTRAVENOUS | Status: DC

## 2019-08-20 MED ORDER — PANTOPRAZOLE SODIUM 40 MG PO TBEC: 40.0000 mg | DELAYED_RELEASE_TABLET | Freq: Two times a day (BID) | ORAL | 1 refills | Status: DC

## 2019-08-20 MED ORDER — HEPARIN SOD (PORK) LOCK FLUSH 100 UNIT/ML IV SOLN
500.0000 [IU] | Freq: Once | INTRAVENOUS | Status: DC
  Filled 2019-08-20: qty 5

## 2019-08-20 MED ORDER — HEPARIN SOD (PORK) LOCK FLUSH 100 UNIT/ML IV SOLN
500.0000 [IU] | INTRAVENOUS | Status: AC | PRN
  Administered 2019-08-20: 500 [IU]
  Filled 2019-08-20: qty 5

## 2019-08-20 MED ORDER — FLUCONAZOLE 200 MG PO TABS: 200.0000 mg | ORAL_TABLET | Freq: Every day | ORAL | 0 refills | Status: DC

## 2019-08-20 MED ORDER — SODIUM CHLORIDE 0.9% FLUSH
10.0000 mL | INTRAVENOUS | Status: DC | PRN
  Filled 2019-08-20: qty 10

## 2019-08-20 NOTE — Progress Notes (Addendum)
     [facility] Progress Note  CC:  Dysphagia/odynophagia and down-trending Hgb  Subjective:  Still waiting to have esophagram.  Feels the same.  No new complaints.  Objective:  Vital signs in last 24 hours: Temp:  [98 F (36.7 C)-99.6 F (37.6 C)] 99.6 F (37.6 C) (06/10 0524) Pulse Rate:  [62-72] 62 (06/10 0524) Resp:  [16-21] 17 (06/10 0524) BP: (101-137)/(61-88) 137/83 (06/10 0524) SpO2:  [93 %-100 %] 98 % (06/10 0524) Weight:  [69.3 kg] 69.3 kg (06/10 0531) Last BM Date: 08/19/19 General:  Alert, Well-developed, in NAD Heart:  Regular rate and rhythm; no murmurs Pulm:  CTAB.  No increased WOB. Abdomen:  Soft, non-distended.  BS present.  Non-tender. Extremities:  Without edema. Neurologic:  Alert and oriented x 4;  grossly normal neurologically. Psych:  Alert and cooperative. Normal mood and affect.  Intake/Output from previous day: 06/09 0701 - 06/10 0700 In: 2477 [P.O.:1560; Blood:625; IV Piggyback:292] Out: 8280 [Urine:1550] Intake/Output this shift: Total I/O In: -  Out: 300 [Urine:300]  Lab Results: Recent Labs    08/18/19 0545 08/19/19 0308 08/20/19 0440  WBC 10.2 9.1 9.9  HGB 7.0* 6.2* 8.8*  HCT 22.0* 20.1* 28.1*  PLT 396 341 351   BMET Recent Labs    08/18/19 0545 08/19/19 0308 08/20/19 0440  NA 140 138 138  K 3.7 3.4* 3.7  CL 112* 107 106  CO2 16* 22 22  GLUCOSE 88 91 90  BUN 67* 27* 8  CREATININE 2.34* 1.04 0.80  CALCIUM 9.0 8.6* 9.1   LFT Recent Labs    08/19/19 0308  PROT 7.0  ALBUMIN 3.2*  AST 14*  ALT 12  ALKPHOS 60  BILITOT 0.6   Assessment / Plan: *    Dysphagia/odynophagia for 2 weeks. EGD 07/16/2019 showed small HH, nonocclusive Schatzki's ring. Day 1 empiric Diflucan in case this is Candida esophagitis.  Received IV formulation today, p.o. will be initiated today, 6/10.  Continues on Protonix 40 IV/day, takes this 40 mg po bid at home.    *    Acute on chronic anemia.  FOBT negative.  Component of  IDA. Taking oral iron at home.  Received 2 PRBCs 6/9 with nice response to 8.8 grams this AM.  No history of bloody or melenic stool. In addition to EGD of 07/16/2019, also had colonoscopy that same day which showed medium sized internal hemorrhoids (per dc summary, procedure reports not recovered).  *    Progression of lung cancer per CT last month.  History of brain mets and bone mets, craniotomy/resection 07/2016.  Recurrent brain mets treated with stereotactic radiosurgery 03/2017.  Previous chemotherapy.  08/2018 radiation to presumed tumor L5.  Dr Alen Blew ordered PET scan (not completed or yet scheduled) and by referral to Dr Lamonte Sakai (date set for 6/11). Chronic Decadron 1mg /day at home, not currently in place.  -Will increase pantoprazole 40 mg IV to BID. -Continue empiric diflucan for possible esophageal candidiasis. -Awaiting results of barium esophagram.   LOS: 3 days   Laban Emperor. Zehr  08/20/2019, 11:05 AM     Attending Physician Note   I have taken an interval history, reviewed the chart and examined the patient. I agree with the Advanced Practitioner's note, impression and recommendations.   Lucio Edward, MD [facility]

## 2019-08-20 NOTE — Evaluation (Signed)
Physical Therapy Evaluation Only Patient Details Name: Eric Mason MRN: [id number] DOB: [birth year] Today's Date: 08/20/2019   History of Present Illness  49 y.o. male with medical history significant of metastatic lung cancer to the brain, multiple family history of cancers, history of right temporal lobe mass and traumatic pneumothorax was brought in by his daughter secondary to progressive confusion over the last week.  Patient has had problems was eating drinking and multiple episodes of vomiting.  He is able to tolerate a little bit of fluids.  No diarrhea today but he has had multiple episodes.  He was so confused that he could not identify family members.  He was found on the floor this morning fumbling with the broom.  Patient reported some type of abdominal pain.  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  No dysuria.  No other significant complaint.  He is normally fully awake and alert at baseline.  Patient was now noted to have acute renal failure with creatinine up to 6.  Appears to be prerenal.  Nephrology consulted and recommended hydration to see improvement.  If no improvement nephrology will be seeing patient.  Patient being admitted to the hospital for evaluation and treatment..    Clinical Impression  Physical therapy evaluation completed, patient is at baseline and no further PT services recommended at this time. Pt is modified Independent to Independent with all OOB mobility and has good family support. Patient discharged to care of nursing for ambulation daily as tolerated for length of stay.     Follow Up Recommendations No PT follow up    Equipment Recommendations  None recommended by PT    Recommendations for Other Services       Precautions / Restrictions        Mobility  Bed Mobility Overal bed mobility: Modified Independent  General bed mobility comments: slightly increased time  Transfers Overall  transfer level: Modified independent Equipment used: None  General transfer comment: BUE assisting to rise, good steadinesss upon rising  Ambulation/Gait Ambulation/Gait assistance: Independent Gait Distance (Feet): 120 Feet Assistive device: None Gait Pattern/deviations: WFL(Within Functional Limits);Step-through pattern Gait velocity: slightly decreased   General Gait Details: good steadiness with straightline gait and turns in hallway, able to navigate past obstacles in room and safety manuever to bed, no loss of balance, no unsteadiness, denies dizziness/lightheadedness  Stairs            Wheelchair Mobility    Modified Rankin (Stroke Patients Only)       Balance Overall balance assessment: Modified Independent          Pertinent Vitals/Pain Pain Assessment: No/denies pain    Home Living Family/patient expects to be discharged to:: Private residence Living Arrangements: Spouse/significant other;Children;Other relatives (wife, son and brother in law) Available Help at Discharge: Family;Available PRN/intermittently (son and brother in law work 5 minutes from the house during the day) Type of Home: House Home Access: [place]: One [place]: Environmental consultant - 2 wheels;Cane - single point;Bedside commode;Wheelchair - manual      Prior Function Level of Independence: Independent         Comments: Pt reports ind with community and household ambulation and ind with ADLs. Pt reports spouse has been in hospital since March 2021.     Hand Dominance        Extremity/Trunk Assessment   Upper Extremity Assessment Upper Extremity Assessment: Overall  WFL for tasks assessed    Lower Extremity Assessment Lower Extremity Assessment: Overall WFL for tasks assessed (AROM WNL, strength 4+/5, denies numbness/tingling)    Cervical / Trunk Assessment Cervical / Trunk Assessment: Normal  Communication   Communication: No difficulties   Cognition Arousal/Alertness: Awake/alert Behavior During Therapy: WFL for tasks assessed/performed Overall Cognitive Status: Within Functional Limits for tasks assessed      General Comments      Exercises     Assessment/Plan    PT Assessment Patent does not need any further PT services  PT Problem List         PT Treatment Interventions      PT Goals (Current goals can be found in the Care Plan section)  Acute Rehab PT Goals Patient Stated Goal: return home with family support PT Goal Formulation: With patient Time For Goal Achievement: 08/27/19 Potential to Achieve Goals: Good    Frequency     Barriers to discharge        Co-evaluation               AM-PAC PT "6 Clicks" Mobility  Outcome Measure Help needed turning from your back to your side while in a flat bed without using bedrails?: None Help needed moving from lying on your back to sitting on the side of a flat bed without using bedrails?: None Help needed moving to and from a bed to a chair (including a wheelchair)?: None Help needed standing up from a chair using your arms (e.g., wheelchair or bedside chair)?: None Help needed to walk in hospital room?: None Help needed climbing 3-5 steps with a railing? : None 6 Click Score: 24    End of Session   Activity Tolerance: Patient tolerated treatment well Patient left: in bed;with call bell/phone within reach Nurse Communication: Mobility status PT Visit Diagnosis: Other abnormalities of gait and mobility (R26.89)    Time: 1050-1104 PT Time Calculation (min) (ACUTE ONLY): 14 min   Charges:   PT Evaluation $PT Eval Low Complexity: 1 Low           Tori Hajra Port PT, DPT 08/20/19, 12:51 PM

## 2019-08-20 NOTE — Progress Notes (Signed)
Pt to be discharged to home this afternoon. Discharge instructions including Medications and schedule for these Medications reviewed with the Pt. Pt verbalized understanding of all discharge instructions including Medications and schedules. Discharge Packet with Pt at time of discharge.

## 2019-08-20 NOTE — Discharge Summary (Signed)
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  Patient was having poor oral intake, multiple episode of vomiting, diarrhea.  On presentation he was hypotensive, tachycardic.  He had leukocytosis.  Hemoglobin of 8.3.  He was found to have severe AKI with creatinine of 6.31.  FOBT was negative.  CT abdomen/pelvis did not show any acute abnormalities.  Head CT showed hypodensity in the left insula consistent with known metastatic disease. Patient reported black  stools .  His hemoglobin dropped to the range of 6 and was transfused with 2  units of PRBC.  Complaint of odynophagia.  GI consulted for strong suspicion for upper GI bleed/esophagitis.  He was just admitted in [facility] and underwent EGD and colonoscopy in May which did not show any significant finding of bleeding source.  GI recommended twice a day PPI and fluconazole prophylactically for candidal esophagitis.  He is hemodynamically stable for discharge home today.  Following problems were addressed during his hospitalization:   Altered mental status: Most likely secondary to metabolic encephalopathy from severe uremia from acute kidney injury and metabolic derangements.  Currently alert and oriented.  AKI: Patient had AKI due to decreased oral intake, dehydration.  AKI resolved with IV fluids.  Microcytic anemia/Iron deficiency anemia: FOBT negative.  Hb dropped to 6.2  .He reported large volume black tarry stool .Iron studies showed low iron.  He was transfused with 2 units of PRBC today.  Also given IV iron.  Continue iron supplements on discharge. Patient had a EGD endoscopy procedure done for same reason in May this year at [facility] without finding of any bleeding source.  At that time he was recommended to have a small bowel evaluation but he declined. Started on PPI IV and fluconazole prophylactically.  Barium esophagram showed mild distal esophageal fold thickening which might suggest esophagitis.  Odynophagia: He gives history of esophageal candidiasis.  He was taking nystatin at home.  We started on fluconazole.  Complains of burning pain while swallowing food.  Metastatic lung cancer: Metastasis to bones and brain.  Follows with oncology.  Oncology will follow here.  Palliative care consulted for goals of care discussion.  Patient wants to continue full scope of treatment and not interested to discuss about goals of care.  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  He has severe anorexia.  Dietitian was consulted  Discharge Diagnoses:  Principal Problem:   ARF (acute renal failure) ([place]) Active Problems:   Brain metastasis (HCC)   Tobacco abuse   Gastroesophageal reflux disease   Lung cancer (HCC)   Intractable nausea and vomiting   Bone metastasis (HCC)   Goals of care, counseling/discussion   Palliative care by specialist    Discharge Instructions  Discharge Instructions    Diet - low sodium heart healthy   Complete by: As directed    Discharge instructions   Complete by: As directed    1)Please take prescribed medications  as instructed. 2)Follow up with your PCP in a week.  Do a CBC test during the follow-up 3)Follow up with your oncologist as an outpatient.   Increase activity slowly    Complete by: As directed      Allergies as of 08/20/2019      Reactions   Latex Itching, Rash      Medication List    STOP taking these medications   lidocaine-prilocaine cream Commonly known as: EMLA   nicotine 21 mg/24hr patch Commonly known as: NICODERM CQ - dosed in mg/24 hours   nystatin 100000 UNIT/ML suspension Commonly known as: MYCOSTATIN     TAKE these medications   albuterol 108 (90 Base) MCG/ACT inhaler Commonly known as: VENTOLIN HFA Inhale 2 puffs into the lungs every 6 (six) hours as needed for wheezing or shortness of breath.(200/8=25)   amitriptyline 75 MG tablet Commonly known as: ELAVIL Take 1 tablet (75 mg total) by mouth at bedtime.   amLODipine 5 MG tablet Commonly known as: NORVASC Take 5 mg by mouth daily.   aspirin 81 MG chewable tablet Chew 81 mg by mouth daily.   B-complex with vitamin C tablet Take 1 tablet by mouth daily.   baclofen 10 MG tablet Commonly known as: LIORESAL Take 1 tablet (10 mg total) by mouth 2 (two) times daily.   bisacodyl 5 MG EC tablet Commonly known as: DULCOLAX Take 2 tablets (10 mg total) by mouth daily as needed for moderate constipation.   busPIRone 5 MG tablet Commonly known as: BUSPAR Take 1 tablet (5 mg total) by mouth 2 (two) times daily.   Calcium Carb-Cholecalciferol 500-400 MG-UNIT Tabs Commonly known as: Calcium 500 +D 1 tab PO BID   dexamethasone 2 MG tablet Commonly known as: DECADRON Take 0.5 tablets (1 mg total) by mouth daily.   DULoxetine 30 MG capsule Commonly known as: CYMBALTA Take 1 capsule (30 mg total) by mouth daily.   ferrous sulfate 325 (65 FE) MG tablet Take 325 mg by mouth daily.   fluconazole 200 MG tablet Commonly known as: DIFLUCAN Take 1 tablet (200 mg total) by mouth daily. Start taking on: August 21, 5730   folic acid 1 MG tablet Commonly known as: FOLVITE Take 1 tablet (1 mg total) by mouth daily.   gabapentin 800 MG tablet Commonly known as:  NEURONTIN Take 1 tablet (800 mg total) by mouth 3 (three) times daily.   levETIRAcetam 1000 MG tablet Commonly known as: KEPPRA Take 2 tablets (2,000 mg total) by mouth 2 (two) times daily.   lisinopril 20 MG tablet Commonly known as: ZESTRIL Take 20 mg by mouth daily.   multivitamin tablet Take 1 tablet by mouth daily.   oxyCODONE 15 MG immediate release tablet Commonly known as: ROXICODONE Take 1 tablet (15 mg total) by mouth every 4 (four) hours as needed for pain.   pantoprazole 40 MG tablet Commonly known as: PROTONIX Take 1 tablet (40 mg total) by mouth 2 (two) times daily.   polyethylene glycol 17 g packet Commonly known as: MIRALAX / GLYCOLAX Take 17 g by mouth daily as needed.   promethazine 25 MG tablet Commonly known as: PHENERGAN Take 1 tablet (25 mg total) by mouth every 6 (six) hours as needed for nausea or vomiting.   senna-docusate 8.6-50 MG tablet Commonly known as: Senokot-S Take 2 tablets by mouth 2 (two) times daily.   thiamine 100 MG tablet Commonly known as: Vitamin B-1 Take 1 tablet (100 mg total) by mouth daily.  Follow-up Information    [name], MD. Schedule an appointment as soon as possible for a visit in 1 week(s).   Specialty: Internal Medicine Contact information: [address] [place] [address] [phone]              Allergies  Allergen Reactions  . Latex Itching and Rash    Consultations:  GI   Procedures/Studies: CT ABDOMEN PELVIS WO CONTRAST  Result Date: 08/17/2019 CLINICAL DATA:  Abdominal pain.  History of metastatic lung cancer. EXAM: CT ABDOMEN AND PELVIS WITHOUT CONTRAST TECHNIQUE: Multidetector CT imaging of the abdomen and pelvis was performed following the standard protocol without IV contrast. COMPARISON:  CT dated February 03, 2019. FINDINGS: Lower chest: Emphysematous changes are again noted. There is some chronic scarring and atelectasis in the right middle lobe and lingula.The  heart size is normal. Hepatobiliary: The liver is normal. Normal gallbladder.There is no biliary ductal dilation. Pancreas: Normal contours without ductal dilatation. No peripancreatic fluid collection. Spleen: Unremarkable. Adrenals/Urinary Tract: --Adrenal glands: Unremarkable. --Right kidney/ureter: No hydronephrosis or radiopaque kidney stones. --Left kidney/ureter: No hydronephrosis or radiopaque kidney stones. --Urinary bladder: Unremarkable. Stomach/Bowel: --Stomach/Duodenum: No hiatal hernia or other gastric abnormality. Normal duodenal course and caliber. --Small bowel: Unremarkable. --Colon: Unremarkable. --Appendix: Normal. Vascular/Lymphatic: Atherosclerotic calcification is present within the non-aneurysmal abdominal aorta, without hemodynamically significant stenosis. --No retroperitoneal lymphadenopathy. --No mesenteric lymphadenopathy. --No pelvic or inguinal lymphadenopathy. Reproductive: Unremarkable Other: No ascites or free air. The abdominal wall is normal. Musculoskeletal. No acute displaced fractures. IMPRESSION: No acute abnormality.  Chronic findings as detailed above. Electronically Signed   By: Constance Holster M.D.   On: 08/17/2019 20:05   CT Head Wo Contrast  Result Date: 08/17/2019 CLINICAL DATA:  Altered level of consciousness, history of metastatic lung cancer with brain metastases EXAM: CT HEAD WITHOUT CONTRAST TECHNIQUE: Contiguous axial images were obtained from the base of the skull through the vertex without intravenous contrast. COMPARISON:  06/10/2019 FINDINGS: Brain: Hypodensity within the insula, with areas of cortical calcification, compatible with known intracranial metastatic disease. Please refer to recent MRI findings. Postsurgical changes are seen from right parietal craniotomy with underlying encephalomalacia, compatible with prior resection of metastatic disease. No acute infarct or hemorrhage. Lateral ventricles and midline structures are unremarkable. No acute  extra-axial fluid collections. Vascular: No hyperdense vessel or unexpected calcification. Skull: Postsurgical changes from right parietal craniotomy. No acute bony abnormalities. Sinuses/Orbits: No acute finding. Other: None. IMPRESSION: 1. Hypodensity left insula consistent with known intracranial metastatic disease. Please refer to prior MRI findings. 2. No acute infarct or hemorrhage. Electronically Signed   By: Randa Ngo M.D.   On: 08/17/2019 20:04   DG ESOPHAGUS W DOUBLE CM (HD)  Result Date: 08/20/2019 CLINICAL DATA:  Food sticking in the mid esophagus EXAM: ESOPHOGRAM / BARIUM SWALLOW / BARIUM TABLET STUDY TECHNIQUE: Combined double contrast and single contrast examination performed using effervescent crystals, thick barium liquid, and thin barium liquid. The patient was observed with fluoroscopy swallowing a 13 mm barium sulphate tablet. FLUOROSCOPY TIME:  Fluoroscopy Time:  2.2 minutes Radiation Exposure Index (if provided by the fluoroscopic device): 17.7 mGy Number of Acquired Spot Images: 0 COMPARISON:  CT abdomen 08/17/2019 FINDINGS: During the pharyngeal phase of swallowing, there was flash penetration of the larynx without overt tracheal aspiration. Mild distal esophageal fold thickening on the mucosal relief images. No esophageal stricture or ulceration is identified. Distal esophageal secondary and tertiary contractions are noted. There is proximal escape of contrast on all swallows, an  appearance suggesting mild but nonspecific esophageal dysmotility. A 13 mm barium tablet passed briskly into the stomach. IMPRESSION: 1. Mild distal esophageal fold thickening may reflect esophagitis. No ulceration or stricture identified. 2. Mild nonspecific esophageal dysmotility, with proximal escape contrast on all swallows, and distal esophageal secondary and tertiary contractions. 3. Flash laryngeal penetration, without tracheal aspiration. Electronically Signed   By: Van Clines M.D.   On:  08/20/2019 12:41      Subjective: Patient seen and examined at the bedside this morning.  Hemodynamically stable for discharge to home today.  Discharge Exam: Vitals:   08/20/19 0524 08/20/19 1358  BP: 137/83 (!) 130/44  Pulse: 62 62  Resp: 17 18  Temp: 99.6 F (37.6 C) 99 F (37.2 C)  SpO2: 98% 100%   Vitals:   08/20/19 0238 08/20/19 0524 08/20/19 0531 08/20/19 1358  BP: 133/77 137/83  (!) 130/44  Pulse: 67 62  62  Resp: (!) 21 17  18   Temp: 98 F (36.7 C) 99.6 F (37.6 C)  99 F (37.2 C)  TempSrc: Oral Oral  Oral  SpO2: 97% 98%  100%  Weight:   69.3 kg   Height:        General: Pt is alert, awake, not in acute distress Cardiovascular: RRR, S1/S2 +, no rubs, no gallops Respiratory: CTA bilaterally, no wheezing, no rhonchi Abdominal: Soft, NT, ND, bowel sounds + Extremities: no edema, no cyanosis    The results of significant diagnostics from this hospitalization (including imaging, microbiology, ancillary and laboratory) are listed below for reference.     Microbiology: Recent Results (from the past 240 hour(s))  SARS Coronavirus 2 by RT PCR (hospital order, performed in [facility] hospital lab) Nasopharyngeal Nasopharyngeal Swab     Status: None   Collection Time: 08/17/19  8:43 PM   Specimen: Nasopharyngeal Swab  Result Value Ref Range Status   SARS Coronavirus 2 NEGATIVE NEGATIVE Final    Comment: (NOTE) SARS-CoV-2 target nucleic acids are NOT DETECTED. The SARS-CoV-2 RNA is generally detectable in upper and lower respiratory specimens during the acute phase of infection. The lowest concentration of SARS-CoV-2 viral copies this assay can detect is 250 copies / mL. A negative result does not preclude SARS-CoV-2 infection and should not be used as the sole basis for treatment or other patient management decisions.  A negative result may occur with improper specimen collection / handling, submission of specimen other than nasopharyngeal swab, presence of  viral mutation(s) within the areas targeted by this assay, and inadequate number of viral copies (<250 copies / mL). A negative result must be combined with clinical observations, patient history, and epidemiological information. Fact Sheet for Patients:   StrictlyIdeas.no Fact Sheet for Healthcare Providers: BankingDealers.co.za This test is not yet approved or cleared  by the [place] FDA and has been authorized for detection and/or diagnosis of SARS-CoV-2 by FDA under an Emergency Use Authorization (EUA).  This EUA will remain in effect (meaning this test can be used) for the duration of the COVID-19 declaration under Section 564(b)(1) of the Act, 21 U.S.C. section 360bbb-3(b)(1), unless the authorization is terminated or revoked sooner. Performed at [facility], [place] [address], [place], [place] [address]      Labs: BNP (last 3 results) No results for input(s): BNP in the last 8760 hours. Basic Metabolic Panel: Recent Labs  Lab 08/17/19 1631 08/18/19 0545 08/19/19 0308 08/20/19 0440  NA 135 140 138 138  K 4.0 3.7 3.4* 3.7  CL  103 112* 107 106  CO2 18* 16* 22 22  GLUCOSE 113* 88 91 90  BUN 96* 67* 27* 8  CREATININE 6.31* 2.34* 1.04 0.80  CALCIUM 9.7 9.0 8.6* 9.1  MG  --   --  1.9  --   PHOS  --  4.1  --   --    Liver Function Tests: Recent Labs  Lab 08/17/19 1631 08/18/19 0545 08/19/19 0308  AST 7* 10* 14*  ALT 11 9 12   ALKPHOS 71 62 60  BILITOT 0.4 0.7 0.6  PROT 9.3* 7.4 7.0  ALBUMIN 4.5 3.4* 3.2*   No results for input(s): LIPASE, AMYLASE in the last 168 hours. No results for input(s): AMMONIA in the last 168 hours. CBC: Recent Labs  Lab 08/17/19 1631 08/18/19 0545 08/19/19 0308 08/20/19 0440  WBC 14.6* 10.2 9.1 9.9  NEUTROABS 10.5*  --  3.9 4.2  HGB 8.3* 7.0* 6.2* 8.8*  HCT 26.7* 22.0* 20.1* 28.1*  MCV 72.2* 71.7* 71.8* 76.8*  PLT 438* 396 341 351   Cardiac Enzymes: No  results for input(s): CKTOTAL, CKMB, CKMBINDEX, TROPONINI in the last 168 hours. BNP: Invalid input(s): POCBNP CBG: No results for input(s): GLUCAP in the last 168 hours. D-Dimer No results for input(s): DDIMER in the last 72 hours. Hgb A1c No results for input(s): HGBA1C in the last 72 hours. Lipid Profile No results for input(s): CHOL, HDL, LDLCALC, TRIG, CHOLHDL, LDLDIRECT in the last 72 hours. Thyroid function studies No results for input(s): TSH, T4TOTAL, T3FREE, THYROIDAB in the last 72 hours.  Invalid input(s): FREET3 Anemia work up Recent Labs    08/19/19 0316 08/19/19 0853  VITAMINB12  --  722  FOLATE  --  14.9  FERRITIN  --  39  TIBC  --  362  IRON  --  19*  RETICCTPCT 0.7  --    Urinalysis    Component Value Date/Time   COLORURINE YELLOW 08/17/2019 1830   APPEARANCEUR CLEAR 08/17/2019 1830   LABSPEC 1.012 08/17/2019 1830   PHURINE 5.0 08/17/2019 1830   GLUCOSEU NEGATIVE 08/17/2019 1830   HGBUR SMALL (A) 08/17/2019 1830   [place] NEGATIVE 08/17/2019 1830   [place] NEGATIVE 08/17/2019 1830   PROTEINUR 30 (A) 08/17/2019 1830   NITRITE NEGATIVE 08/17/2019 1830   LEUKOCYTESUR NEGATIVE 08/17/2019 1830   Sepsis Labs Invalid input(s): PROCALCITONIN,  WBC,  LACTICIDVEN Microbiology Recent Results (from the past 240 hour(s))  SARS Coronavirus 2 by RT PCR (hospital order, performed in [facility] hospital lab) Nasopharyngeal Nasopharyngeal Swab     Status: None   Collection Time: 08/17/19  8:43 PM   Specimen: Nasopharyngeal Swab  Result Value Ref Range Status   SARS Coronavirus 2 NEGATIVE NEGATIVE Final    Comment: (NOTE) SARS-CoV-2 target nucleic acids are NOT DETECTED. The SARS-CoV-2 RNA is generally detectable in upper and lower respiratory specimens during the acute phase of infection. The lowest concentration of SARS-CoV-2 viral copies this assay can detect is 250 copies / mL. A negative result does not preclude SARS-CoV-2 infection and should not  be used as the sole basis for treatment or other patient management decisions.  A negative result may occur with improper specimen collection / handling, submission of specimen other than nasopharyngeal swab, presence of viral mutation(s) within the areas targeted by this assay, and inadequate number of viral copies (<250 copies / mL). A negative result must be combined with clinical observations, patient history, and epidemiological information. Fact Sheet for Patients:   StrictlyIdeas.no Fact Sheet for  Healthcare Providers: BankingDealers.co.za This test is not yet approved or cleared  by the [place] and has been authorized for detection and/or diagnosis of SARS-CoV-2 by FDA under an Emergency Use Authorization (EUA).  This EUA will remain in effect (meaning this test can be used) for the duration of the COVID-19 declaration under Section 564(b)(1) of the Act, 21 U.S.C. section 360bbb-3(b)(1), unless the authorization is terminated or revoked sooner. Performed at [facility], [place] [address], [place], [place] [address]     Please note: You were cared for by a hospitalist during your hospital stay. Once you are discharged, your primary care physician will handle any further medical issues. Please note that NO REFILLS for any discharge medications will be authorized once you are discharged, as it is imperative that you return to your primary care physician (or establish a relationship with a primary care physician if you do not have one) for your post hospital discharge needs so that they can reassess your need for medications and monitor your lab values.    Time coordinating discharge: 40 minutes  SIGNED:   Shelly Coss, MD  [facility] 08/20/2019, 3:10 PM Pager [phone]  If 7PM-7AM, please contact night-coverage [email or web address] Password [email or web address]

## 2019-08-20 NOTE — Progress Notes (Signed)
Added off site orders for [facility] to access and deaccess port acath

## 2019-08-20 NOTE — Evaluation (Signed)
SLP Cancellation Note  Patient Details Name: Eric Mason MRN: [id number] DOB: [birth year]   Cancelled treatment:       Reason Eval/Treat Not Completed: Other (comment) (pt npo for esophagram) Kathleen Lime, MS [facility] SLP Acute Rehab Services Office [phone]   Macario Golds 08/20/2019, 7:31 AM

## 2019-08-21 ENCOUNTER — Telehealth: Payer: Self-pay | Admitting: Oncology

## 2019-08-21 ENCOUNTER — Ambulatory Visit: Payer: Medicaid Other | Admitting: Emergency Medicine

## 2019-08-21 ENCOUNTER — Inpatient Hospital Stay (HOSPITAL_BASED_OUTPATIENT_CLINIC_OR_DEPARTMENT_OTHER): Payer: Medicaid Other | Admitting: Oncology

## 2019-08-21 ENCOUNTER — Encounter: Payer: Self-pay | Admitting: Emergency Medicine

## 2019-08-21 ENCOUNTER — Telehealth: Payer: Self-pay

## 2019-08-21 ENCOUNTER — Other Ambulatory Visit: Payer: Self-pay

## 2019-08-21 VITALS — BP 118/62 | HR 93 | Temp 98.4°F | Ht 70.0 in | Wt 156.0 lb

## 2019-08-21 VITALS — BP 111/62 | HR 91 | Temp 98.1°F | Resp 18 | Wt 156.7 lb

## 2019-08-21 DIAGNOSIS — C3490 Malignant neoplasm of unspecified part of unspecified bronchus or lung: Secondary | ICD-10-CM

## 2019-08-21 DIAGNOSIS — C801 Malignant (primary) neoplasm, unspecified: Secondary | ICD-10-CM | POA: Diagnosis not present

## 2019-08-21 DIAGNOSIS — Z9221 Personal history of antineoplastic chemotherapy: Secondary | ICD-10-CM | POA: Diagnosis not present

## 2019-08-21 DIAGNOSIS — Z7982 Long term (current) use of aspirin: Secondary | ICD-10-CM | POA: Diagnosis not present

## 2019-08-21 DIAGNOSIS — R590 Localized enlarged lymph nodes: Secondary | ICD-10-CM | POA: Diagnosis not present

## 2019-08-21 DIAGNOSIS — C7931 Secondary malignant neoplasm of brain: Secondary | ICD-10-CM | POA: Diagnosis present

## 2019-08-21 DIAGNOSIS — Z7952 Long term (current) use of systemic steroids: Secondary | ICD-10-CM | POA: Diagnosis not present

## 2019-08-21 DIAGNOSIS — C7951 Secondary malignant neoplasm of bone: Secondary | ICD-10-CM | POA: Diagnosis not present

## 2019-08-21 DIAGNOSIS — Z79899 Other long term (current) drug therapy: Secondary | ICD-10-CM | POA: Diagnosis not present

## 2019-08-21 DIAGNOSIS — Z923 Personal history of irradiation: Secondary | ICD-10-CM | POA: Diagnosis not present

## 2019-08-21 DIAGNOSIS — G893 Neoplasm related pain (acute) (chronic): Secondary | ICD-10-CM | POA: Diagnosis not present

## 2019-08-21 NOTE — Patient Instructions (Signed)
We will work on setting up bronchoscopy with ultrasound to sample your lymph nodes.  Hopefully 09/01/2019. Follow-up with Dr. Alen Blew today as planned.  Get your PET scan as planned, hopefully soon as possible.  It would be helpful to review before we perform your bronchoscopy. Follow with Dr Lamonte Sakai in 1 month

## 2019-08-21 NOTE — Telephone Encounter (Signed)
Scheduled appt per 6/11 los.  Printed and mailed appt calendar

## 2019-08-21 NOTE — Progress Notes (Signed)
Hematology and Oncology Follow Up   Eric Mason [id number] 1970-10-17 49 y.o. 08/21/2019 11:11 AM Eric Mason, [name], Eric Batman, MD       Principle Diagnosis: 49 year old with stage IV advanced malignancy diagnosed in 2018.  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    Prior Therapy:Status post craniotomy and resection in May 2018. This was followed by [facility] completed in June 2018.  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  Carboplatin and paclitaxel with Pembrolizumab cycle 1 given on 06/18/2017.  He completed 4 cycles of carboplatin and paclitaxel and completed 6 cycles of Pembrolizumab total.  He is status post repeat radiation to L5 presumed tumor ablated in June 2020.  Current therapy: Active surveillance.   Interim History: Eric Mason is here for return evaluation.  Since the last visit, he was hospitalized between June 7 and August 20, 2019 after presenting with altered mental status and encephalopathy for presumed uremia and acute kidney injury.  At that time his creatinine was 6.3 with a BUN of 96 and responded to hydration with normalization of his BUN and creatinine upon discharge.  His hemoglobin was 6.2 and received transfusion with hemoglobin was 8.8 on discharge.  Since his discharge, he is feeling better at this time is able to eat and drink and maintain adequate hydration.  He denies any worsening pain or respiratory complaints at this time.     Medications: Reviewed without changes. Current Outpatient Medications  Medication Sig Dispense Refill  . albuterol (VENTOLIN HFA) 108 (90 Base) MCG/ACT inhaler Inhale 2 puffs into the lungs every 6 (six) hours as needed for wheezing or shortness of breath.(200/8=25) 8.5 g 6  . amitriptyline (ELAVIL) 75 MG tablet Take 1 tablet (75 mg total) by  mouth at bedtime. 30 tablet 3  . amLODipine (NORVASC) 5 MG tablet Take 5 mg by mouth daily.     [name] aspirin 81 MG chewable tablet Chew 81 mg by mouth daily.     . B Complex-C (B-COMPLEX WITH VITAMIN C) tablet Take 1 tablet by mouth daily.    . baclofen (LIORESAL) 10 MG tablet Take 1 tablet (10 mg total) by mouth 2 (two) times daily. 60 each 3  . bisacodyl (DULCOLAX) 5 MG EC tablet Take 2 tablets (10 mg total) by mouth daily as needed for moderate constipation. 30 tablet 0  . busPIRone (BUSPAR) 5 MG tablet Take 1 tablet (5 mg total) by mouth 2 (two) times daily. 60 tablet 1  . Calcium Carb-Cholecalciferol (CALCIUM 500 +D) 500-400 MG-UNIT TABS 1 tab PO BID 60 tablet 2  . dexamethasone (DECADRON) 2 MG tablet Take 0.5 tablets (1 mg total) by mouth daily. 30 tablet 0  . DULoxetine (CYMBALTA) 30 MG capsule Take 1 capsule (30 mg total) by mouth daily. 30 capsule 5  . ferrous sulfate 325 (65 FE) MG tablet Take 325 mg by mouth daily.     . fluconazole (DIFLUCAN) 200 MG tablet Take 1 tablet (200 mg total) by mouth daily. 10 tablet 0  . folic acid (FOLVITE) 1 MG tablet Take 1 tablet (1 mg total) by mouth daily. 100 tablet 1  . gabapentin (NEURONTIN) 800 MG tablet Take 1 tablet (800 mg total) by mouth 3 (three) times daily. 90 tablet 0  . levETIRAcetam (KEPPRA) 1000 MG tablet Take 2 tablets (2,000 mg total) by mouth 2 (two) times daily.  120 tablet 3  . lisinopril (ZESTRIL) 20 MG tablet Take 20 mg by mouth daily.     . Multiple Vitamin (MULTIVITAMIN) tablet Take 1 tablet by mouth daily.    [name] oxyCODONE (ROXICODONE) 15 MG immediate release tablet Take 1 tablet (15 mg total) by mouth every 4 (four) hours as needed for pain. 126 tablet 0  . pantoprazole (PROTONIX) 40 MG tablet Take 1 tablet (40 mg total) by mouth 2 (two) times daily. 60 tablet 1  . polyethylene glycol (MIRALAX / GLYCOLAX) 17 g packet Take 17 g by mouth daily as needed. 14 each 0  . promethazine (PHENERGAN) 25 MG tablet Take 1 tablet (25 mg total)  by mouth every 6 (six) hours as needed for nausea or vomiting. 30 tablet 1  . senna-docusate (SENOKOT-S) 8.6-50 MG tablet Take 2 tablets by mouth 2 (two) times daily. 30 tablet 1  . thiamine (VITAMIN B-1) 100 MG tablet Take 1 tablet (100 mg total) by mouth daily. 100 tablet 0   No current facility-administered medications for this visit.   Facility-Administered Medications Ordered in Other Visits  Medication Dose Route Frequency Provider Last Rate Last Admin  . sodium chloride flush (NS) 0.9 % injection 10 mL  10 mL Intracatheter PRN Wyatt Portela, MD   10 mL at 06/18/17 1815     Allergies:  Allergies  Allergen Reactions  . Latex Itching and Rash     Physical examination:   Blood pressure 111/62, pulse 91, temperature 98.1 F (36.7 C), temperature source Temporal, resp. rate 18, weight 156 lb 11.2 oz (71.1 kg), SpO2 100 %.   ECOG 1  General appearance: Alert, awake without any distress. Head: Atraumatic without abnormalities Oropharynx: Without any thrush or ulcers. Eyes: No scleral icterus. Lymph nodes: No lymphadenopathy noted in the cervical, supraclavicular, or axillary nodes Heart:regular rate and rhythm, without any murmurs or gallops.   Lung: Clear to auscultation without any rhonchi, wheezes or dullness to percussion. Abdomin: Soft, nontender without any shifting dullness or ascites. Musculoskeletal: No clubbing or cyanosis. Neurological: No motor or sensory deficits. Skin: No rashes or lesions. Psychiatric: Mood and affect appeared normal.         Lab Results: Lab Results  Component Value Date   WBC 9.9 08/20/2019   HGB 8.8 (L) 08/20/2019   HCT 28.1 (L) 08/20/2019   MCV 76.8 (L) 08/20/2019   PLT 351 08/20/2019     Chemistry      Component Value Date/Time   NA 138 08/20/2019 0440   NA 137 10/30/2016 0909   K 3.7 08/20/2019 0440   K 3.3 (L) 10/30/2016 0909   CL 106 08/20/2019 0440   CO2 22 08/20/2019 0440   CO2 27 10/30/2016 0909   BUN 8  08/20/2019 0440   BUN 4.8 (L) 10/30/2016 0909   CREATININE 0.80 08/20/2019 0440   CREATININE 0.83 02/03/2019 0839   CREATININE 0.9 10/30/2016 0909      Component Value Date/Time   CALCIUM 9.1 08/20/2019 0440   CALCIUM 9.3 10/30/2016 0909   ALKPHOS 60 08/19/2019 0308   ALKPHOS 93 10/30/2016 0909   AST 14 (L) 08/19/2019 0308   AST 14 (L) 02/03/2019 0839   AST 35 (H) 10/30/2016 0909   ALT 12 08/19/2019 0308   ALT 11 02/03/2019 0839   ALT 25 10/30/2016 0909   BILITOT 0.6 08/19/2019 0308   BILITOT 0.4 02/03/2019 0839   BILITOT 0.41 10/30/2016 0909     I  EXAM: CT ABDOMEN AND PELVIS  WITHOUT CONTRAST  TECHNIQUE: Multidetector CT imaging of the abdomen and pelvis was performed following the standard protocol without IV contrast.  COMPARISON:  CT dated February 03, 2019.  FINDINGS: Lower chest: Emphysematous changes are again noted. There is some chronic scarring and atelectasis in the right middle lobe and lingula.The heart size is normal.  Hepatobiliary: The liver is normal. Normal gallbladder.There is no biliary ductal dilation.  Pancreas: Normal contours without ductal dilatation. No peripancreatic fluid collection.  Spleen: Unremarkable.  Adrenals/Urinary Tract:  --Adrenal glands: Unremarkable.  --Right kidney/ureter: No hydronephrosis or radiopaque kidney stones.  --Left kidney/ureter: No hydronephrosis or radiopaque kidney stones.  --Urinary bladder: Unremarkable.  Stomach/Bowel:  --Stomach/Duodenum: No hiatal hernia or other gastric abnormality. Normal duodenal course and caliber.  --Small bowel: Unremarkable.  --Colon: Unremarkable.  --Appendix: Normal.  Vascular/Lymphatic: Atherosclerotic calcification is present within the non-aneurysmal abdominal aorta, without hemodynamically significant stenosis.  --No retroperitoneal lymphadenopathy.  --No mesenteric lymphadenopathy.  --No pelvic or inguinal  lymphadenopathy.  Reproductive: Unremarkable  Other: No ascites or free air. The abdominal wall is normal.  Musculoskeletal. No acute displaced fractures.  IMPRESSION: No acute abnormality.  Chronic findings as detailed above.  IMPRESSION: 1. Hypodensity left insula consistent with known intracranial metastatic disease. Please refer to prior MRI findings. 2. No acute infarct or hemorrhage.     Impression and Plan:  49 year old man with:  1.    Advanced malignancy diagnosed in 2018.  He was found to have stage IV poorly differentiated tumor.  The natural course of this disease was updated at this time and his recent imaging studies of the chest showed mediastinal adenopathy that warrants further evaluation.  He was evaluated by Dr. Lamonte Sakai today and scheduled to have a bronchoscopy and EBUS for tissue biopsy not only to revisit the diagnosis but also obtain tissue for molecular testing which will dictate his treatment moving forward.  His PET scan has not been scheduled and we will allow follow-up on scheduling that soon as possible.  Treatment options at this time will be dictated by his biopsy and specifically molecular testing to identify specific genetic mutation that could be driving his malignancy.  2.  CNS and spinal metastasis: No recent metastatic disease noted.  Continues to follow with Dr. Mickeal Skinner.  3.  Acute renal failure: Resolved at this time with adequate hydration.  Continue to encourage appropriate hydration and fluid intake.  4.  Pain: Related to his advanced malignancy and currently manageable on oxycodone.   5. Follow-up:  After biopsy to discuss treatment options moving forward.  30  minutes were spent on this encounter.  The time was dedicated to reviewing imaging studies, discussing treatment options and coordinating future plan of care.    Zola Button, MD 08/21/2019 11:11 AM
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
## 2019-08-21 NOTE — Telephone Encounter (Signed)
Called and scheduled patient for a PET scan on 08/31/19 with arrival at 12:30. Left voicemail making patient aware of appointment day and time and  NPO 6 hours prior to scan. Instructed patient to call [phone] with any questions or concerns.

## 2019-08-21 NOTE — Assessment & Plan Note (Signed)
History of metastatic poorly differentiated carcinoma, presumed primary lung cancer.  Now with interval change on CT chest that include some small nodular disease and prominent mediastinal hilar and subcarinal lymphadenopathy.  The new pulmonary nodules are too small to reach with navigation but we should be able to sample the mediastinal adenopathy by endobronchial ultrasound.  Discussed this with him today and he is willing to proceed.  He has a PET scan planned, not yet scheduled.  Hopefully he can get this quickly so that we can review in preparation for the EBUS.  Will try to schedule 6/22.
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
## 2019-08-21 NOTE — Telephone Encounter (Signed)
Transition Care Management Follow-up Telephone Call Date of discharge and from where: 08/20/2019 from [facility] pt at [phone] [facility] Phone)   [phone] (Mobile)  unable to reach/ Left voice message to call back. Name and phone nr provided.

## 2019-08-24 ENCOUNTER — Telehealth: Payer: Self-pay

## 2019-08-24 DIAGNOSIS — F419 Anxiety disorder, unspecified: Secondary | ICD-10-CM

## 2019-08-24 MED ORDER — BUSPIRONE HCL 5 MG PO TABS: 5.0000 mg | ORAL_TABLET | Freq: Two times a day (BID) | ORAL | 1 refills | Status: DC

## 2019-08-24 NOTE — Telephone Encounter (Signed)
Transition Care Management Follow-up Telephone Call Date of discharge and from where: 06/10//2021 Eric Mason How have you been since you were released from the hospital? Not the best but OK   Any questions or concerns? REQUESTED  BUSPAR MEDS REFILLS   Items Reviewed: Did the pt receive and understand the discharge instructions provided? YES Medications obtained and verified? YES  Any new allergies since your discharge? NONE Dietary orders reviewed? Yes  Do you have support at home? Daughter   Functional Questionnaire: (I = Independent and D = Dependent) ADLs: I   Follow up appointments reviewed:  PCP Hospital f/u appt confirmed?  Scheduled to see Eric Mason on 09/07/2019  Specialist Hospital f/u appt confirmed? Pulmonologist 08/21/2019 and oncologist on 09/21/2019  Are transportation arrangements needed? NO If their condition worsens, /is the pt aware to call PCP or go to the Emergency Dept.?   Pt is aware if condition is worsening or start experiencing any of diff breathing, SOB, dizziness, slurred speech, chest pain, extreme fatigue,  Persistent nausea and vomiting, bleeding , rapid weight gain, severe uncontrolled pain, or visual disturbances to return to ED  Was the patient provided with contact information for the PCP's office or ED? YES given.  Was to pt encouraged to call back with questions or concerns?YES name and contact information .   Reminded pt to increase activity slowly and have lab done at the clinic in a week as sugested my Hospital MD. Stated that he has enough to do at this time and will not do llab at this time.

## 2019-08-24 NOTE — Addendum Note (Signed)
Addended by: Karle Plumber B on: 08/24/2019 09:41 PM   Modules accepted: Orders

## 2019-08-24 NOTE — Telephone Encounter (Signed)
Spoke with patient said he is in need of his busPIRone (BUSPAR) 5 MG tablet refills and ask if is possible a refill as he is going through a lot with his health condition. Please refill if indicated ! Thank you

## 2019-08-25 NOTE — Telephone Encounter (Signed)
Called pt made aware

## 2019-08-26 ENCOUNTER — Other Ambulatory Visit: Payer: Self-pay | Admitting: Oncology

## 2019-08-26 DIAGNOSIS — C763 Malignant neoplasm of pelvis: Secondary | ICD-10-CM

## 2019-08-26 DIAGNOSIS — M79605 Pain in left leg: Secondary | ICD-10-CM

## 2019-08-26 DIAGNOSIS — C7931 Secondary malignant neoplasm of brain: Secondary | ICD-10-CM

## 2019-08-26 DIAGNOSIS — C3490 Malignant neoplasm of unspecified part of unspecified bronchus or lung: Secondary | ICD-10-CM

## 2019-08-26 MED ORDER — OXYCODONE HCL 15 MG PO TABS: 15.0000 mg | ORAL_TABLET | ORAL | 0 refills | Status: DC | PRN

## 2019-08-29 ENCOUNTER — Other Ambulatory Visit (HOSPITAL_COMMUNITY): Payer: Medicaid Other

## 2019-08-31 ENCOUNTER — Other Ambulatory Visit (HOSPITAL_COMMUNITY)
Admission: RE | Admit: 2019-08-31 | Discharge: 2019-08-31 | Disposition: A | Discharge status: Home / Self Care | Payer: Medicaid Other | Source: Ambulatory Visit | Attending: Emergency Medicine | Admitting: Emergency Medicine

## 2019-08-31 ENCOUNTER — Encounter (HOSPITAL_COMMUNITY): Payer: Self-pay | Admitting: Emergency Medicine

## 2019-08-31 ENCOUNTER — Ambulatory Visit (HOSPITAL_COMMUNITY): Payer: Medicaid Other

## 2019-08-31 DIAGNOSIS — Z01812 Encounter for preprocedural laboratory examination: Secondary | ICD-10-CM | POA: Insufficient documentation

## 2019-08-31 DIAGNOSIS — Z20822 Contact with and (suspected) exposure to covid-19: Secondary | ICD-10-CM | POA: Diagnosis not present

## 2019-08-31 LAB — SARS CORONAVIRUS 2 (TAT 6-24 HRS): SARS Coronavirus 2: NEGATIVE

## 2019-08-31 NOTE — Anesthesia Preprocedure Evaluation (Addendum)
Anesthesia Evaluation  Patient identified by MRN, date of birth, ID band Patient awake    Reviewed: Allergy & Precautions, NPO status , Patient's Chart, lab work & pertinent test results  Airway Mallampati: II  TM Distance: >3 FB     Dental   Pulmonary Current Smoker,    breath sounds clear to auscultation       Cardiovascular hypertension,  Rhythm:Regular Rate:Normal     Neuro/Psych    GI/Hepatic GERD  ,  Endo/Other    Renal/GU Renal disease     Musculoskeletal   Abdominal   Peds  Hematology   Anesthesia Other Findings   Reproductive/Obstetrics                           Anesthesia Physical Anesthesia Plan  ASA: III  Anesthesia Plan: General   Post-op Pain Management:    Induction: Intravenous  PONV Risk Score and Plan: 2 and Ondansetron, Dexamethasone and Midazolam  Airway Management Planned: Oral ETT  Additional Equipment:   Intra-op Plan:   Post-operative Plan:   Informed Consent: I have reviewed the patients History and Physical, chart, labs and discussed the procedure including the risks, benefits and alternatives for the proposed anesthesia with the patient or authorized representative who has indicated his/her understanding and acceptance.     Dental advisory given  Plan Discussed with: CRNA and Anesthesiologist  Anesthesia Plan Comments: (See APP note by Durel Salts, FNP)       Anesthesia Quick Evaluation

## 2019-08-31 NOTE — Progress Notes (Addendum)
Denies chest pain, shortness of breath, or cardiology visit. Patient reports being in quarentine since COVID test. Educated on visitation policy. Requested anesthesia review labs. Left message with EKG about missing EKG tracings. If EKG tracings not loaded in Epic before surgery tomorrow will need DOS.
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
## 2019-09-01 ENCOUNTER — Ambulatory Visit (HOSPITAL_COMMUNITY)
Admission: RE | Admit: 2019-09-01 | Discharge: 2019-09-01 | Disposition: A | Discharge status: Home / Self Care | Payer: Medicaid Other | Attending: Emergency Medicine | Admitting: Emergency Medicine

## 2019-09-01 ENCOUNTER — Other Ambulatory Visit: Payer: Self-pay

## 2019-09-01 ENCOUNTER — Encounter (HOSPITAL_COMMUNITY): Payer: Self-pay | Admitting: Emergency Medicine

## 2019-09-01 ENCOUNTER — Ambulatory Visit (HOSPITAL_COMMUNITY): Payer: Medicaid Other | Admitting: Emergency Medicine

## 2019-09-01 ENCOUNTER — Encounter (HOSPITAL_COMMUNITY)
Admission: RE | Disposition: A | Discharge status: Home / Self Care | Payer: Medicaid Other | Source: Home / Self Care | Attending: Emergency Medicine

## 2019-09-01 DIAGNOSIS — K219 Gastro-esophageal reflux disease without esophagitis: Secondary | ICD-10-CM | POA: Insufficient documentation

## 2019-09-01 DIAGNOSIS — I10 Essential (primary) hypertension: Secondary | ICD-10-CM | POA: Insufficient documentation

## 2019-09-01 DIAGNOSIS — R590 Localized enlarged lymph nodes: Secondary | ICD-10-CM | POA: Diagnosis present

## 2019-09-01 DIAGNOSIS — F17210 Nicotine dependence, cigarettes, uncomplicated: Secondary | ICD-10-CM | POA: Diagnosis not present

## 2019-09-01 DIAGNOSIS — Z7982 Long term (current) use of aspirin: Secondary | ICD-10-CM | POA: Diagnosis not present

## 2019-09-01 DIAGNOSIS — Z79899 Other long term (current) drug therapy: Secondary | ICD-10-CM | POA: Insufficient documentation

## 2019-09-01 DIAGNOSIS — Z85118 Personal history of other malignant neoplasm of bronchus and lung: Secondary | ICD-10-CM | POA: Insufficient documentation

## 2019-09-01 DIAGNOSIS — Z85841 Personal history of malignant neoplasm of brain: Secondary | ICD-10-CM | POA: Insufficient documentation

## 2019-09-01 DIAGNOSIS — R911 Solitary pulmonary nodule: Secondary | ICD-10-CM | POA: Diagnosis present

## 2019-09-01 DIAGNOSIS — Z85830 Personal history of malignant neoplasm of bone: Secondary | ICD-10-CM | POA: Diagnosis not present

## 2019-09-01 DIAGNOSIS — Z7952 Long term (current) use of systemic steroids: Secondary | ICD-10-CM | POA: Diagnosis not present

## 2019-09-01 HISTORY — PX: VIDEO BRONCHOSCOPY WITH ENDOBRONCHIAL ULTRASOUND: SHX6177

## 2019-09-01 HISTORY — DX: Essential (primary) hypertension: I10

## 2019-09-01 HISTORY — PX: BRONCHIAL WASHINGS: SHX5105

## 2019-09-01 HISTORY — DX: Acute kidney failure, unspecified: N17.9

## 2019-09-01 HISTORY — DX: Chronic obstructive pulmonary disease, unspecified: J44.9

## 2019-09-01 HISTORY — PX: BRONCHIAL NEEDLE ASPIRATION BIOPSY: SHX5106

## 2019-09-01 HISTORY — DX: Anemia, unspecified: D64.9

## 2019-09-01 HISTORY — DX: Anxiety disorder, unspecified: F41.9

## 2019-09-01 SURGERY — BRONCHOSCOPY, WITH EBUS
Anesthesia: General

## 2019-09-01 MED ORDER — SUGAMMADEX SODIUM 200 MG/2ML IV SOLN
INTRAVENOUS | Status: DC | PRN
  Administered 2019-09-01: 150 mg via INTRAVENOUS

## 2019-09-01 MED ORDER — FENTANYL CITRATE (PF) 100 MCG/2ML IJ SOLN
INTRAMUSCULAR | Status: DC | PRN
  Administered 2019-09-01: 100 ug via INTRAVENOUS

## 2019-09-01 MED ORDER — PROMETHAZINE HCL 25 MG/ML IJ SOLN: 12.5000 mg | Freq: Once | INTRAMUSCULAR | Status: AC

## 2019-09-01 MED ORDER — EPHEDRINE SULFATE-NACL 50-0.9 MG/10ML-% IV SOSY
PREFILLED_SYRINGE | INTRAVENOUS | Status: DC | PRN
  Administered 2019-09-01: 5 mg via INTRAVENOUS
  Administered 2019-09-01: 10 mg via INTRAVENOUS

## 2019-09-01 MED ORDER — MIDAZOLAM HCL 2 MG/2ML IJ SOLN
INTRAMUSCULAR | Status: DC | PRN
  Administered 2019-09-01: 2 mg via INTRAVENOUS

## 2019-09-01 MED ORDER — LIDOCAINE HCL (CARDIAC) PF 100 MG/5ML IV SOSY
PREFILLED_SYRINGE | INTRAVENOUS | Status: DC | PRN
  Administered 2019-09-01: 70 mg via INTRAVENOUS

## 2019-09-01 MED ORDER — PHENYLEPHRINE HCL-NACL 10-0.9 MG/250ML-% IV SOLN
INTRAVENOUS | Status: DC | PRN
  Administered 2019-09-01: 40 ug/min via INTRAVENOUS

## 2019-09-01 MED ORDER — CHLORHEXIDINE GLUCONATE 0.12 % MT SOLN
OROMUCOSAL | Status: AC
  Administered 2019-09-01: 15 mL
  Filled 2019-09-01: qty 15

## 2019-09-01 MED ORDER — LACTATED RINGERS IV SOLN: INTRAVENOUS | Status: DC | PRN

## 2019-09-01 MED ORDER — PHENYLEPHRINE 40 MCG/ML (10ML) SYRINGE FOR IV PUSH (FOR BLOOD PRESSURE SUPPORT)
PREFILLED_SYRINGE | INTRAVENOUS | Status: DC | PRN
  Administered 2019-09-01: 120 ug via INTRAVENOUS
  Administered 2019-09-01 (×5): 80 ug via INTRAVENOUS
  Administered 2019-09-01: 120 ug via INTRAVENOUS
  Administered 2019-09-01: 80 ug via INTRAVENOUS

## 2019-09-01 MED ORDER — ONDANSETRON HCL 4 MG/2ML IJ SOLN
INTRAMUSCULAR | Status: DC | PRN
  Administered 2019-09-01: 4 mg via INTRAVENOUS

## 2019-09-01 MED ORDER — DEXAMETHASONE SODIUM PHOSPHATE 10 MG/ML IJ SOLN
INTRAMUSCULAR | Status: DC | PRN
  Administered 2019-09-01: 8 mg via INTRAVENOUS

## 2019-09-01 MED ORDER — HYDRALAZINE HCL 20 MG/ML IJ SOLN
10.0000 mg | Freq: Once | INTRAMUSCULAR | Status: AC
  Administered 2019-09-01: 10 mg via INTRAVENOUS
  Filled 2019-09-01: qty 1

## 2019-09-01 MED ORDER — PROPOFOL 10 MG/ML IV BOLUS
INTRAVENOUS | Status: DC | PRN
  Administered 2019-09-01: 150 mg via INTRAVENOUS
  Administered 2019-09-01: 50 mg via INTRAVENOUS

## 2019-09-01 MED ORDER — HYDROMORPHONE HCL 1 MG/ML IJ SOLN: 0.2500 mg | INTRAMUSCULAR | Status: DC | PRN

## 2019-09-01 MED ORDER — ENALAPRILAT 1.25 MG/ML IV SOLN: 1.2500 mg | Freq: Once | INTRAVENOUS | Status: DC

## 2019-09-01 MED ORDER — POLYETHYLENE GLYCOL 3350 17 G PO PACK: 17.0000 g | PACK | Freq: Every day | ORAL | Status: AC | PRN

## 2019-09-01 MED ORDER — ROCURONIUM BROMIDE 10 MG/ML (PF) SYRINGE
PREFILLED_SYRINGE | INTRAVENOUS | Status: DC | PRN
  Administered 2019-09-01: 60 mg via INTRAVENOUS

## 2019-09-01 MED ORDER — PROMETHAZINE HCL 25 MG PO TABS: 25.0000 mg | ORAL_TABLET | Freq: Four times a day (QID) | ORAL | 0 refills | Status: DC | PRN

## 2019-09-01 MED ORDER — PROMETHAZINE HCL 25 MG/ML IJ SOLN
INTRAMUSCULAR | Status: AC
  Administered 2019-09-01: 12.5 mg via INTRAVENOUS
  Filled 2019-09-01: qty 1

## 2019-09-01 NOTE — Interval H&P Note (Signed)
History and Physical Interval Note:  09/01/2019 7:18 AM  [name]  has presented today for surgery, with the diagnosis of MEDESTINAL LYMPHADENOPATHY.  The various methods of treatment have been discussed with the patient and family. After consideration of risks, benefits and other options for treatment, the patient has consented to  Procedure(s): Grace (N/A) as a surgical intervention.  The patient's history has been reviewed, patient examined, no change in status, stable for surgery.  I have reviewed the patient's chart and labs.  Questions were answered to the patient's satisfaction.    He is having nausea this morning.  Also hypertensive 190/110.  He states that he ran out of his amlodipine 2 days ago, has not taken yesterday or today.  He has taken his lisinopril, other medications.  He does not have any Phenergan at home which he uses as needed.  We will give hydralazine 10 mg x 1 now, Phenergan 12.5 mg x 1 now.   Collene Gobble

## 2019-09-01 NOTE — Discharge Instructions (Signed)
Flexible Bronchoscopy, Care After This sheet gives you information about how to care for yourself after your test. Your doctor may also give you more specific instructions. If you have problems or questions, contact your doctor. Follow these instructions at home: Eating and drinking  Do not eat or drink anything (not even water) for 2 hours after your test, or until your numbing medicine (local anesthetic) wears off.  When your numbness is gone and your cough and gag reflexes have come back, you may: ? Eat only soft foods. ? Slowly drink liquids.  The day after the test, go back to your normal diet. Driving  Do not drive for 24 hours if you were given a medicine to help you relax (sedative).  Do not drive or use heavy machinery while taking prescription pain medicine. General instructions   Take over-the-counter and prescription medicines only as told by your doctor.  Return to your normal activities as told. Ask what activities are safe for you.  Do not use any products that have nicotine or tobacco in them. This includes cigarettes and e-cigarettes. If you need help quitting, ask your doctor.  Keep all follow-up visits as told by your doctor. This is important. It is very important if you had a tissue sample (biopsy) taken. Get help right away if:  You have shortness of breath that gets worse.  You get light-headed.  You feel like you are going to pass out (faint).  You have chest pain.  You cough up: ? More than a little blood. ? More blood than before. Summary  Do not eat or drink anything (not even water) for 2 hours after your test, or until your numbing medicine wears off.  Do not use cigarettes. Do not use e-cigarettes.  Get help right away if you have chest pain.  Please call our office for any questions or concerns.  [phone].  This information is not intended to replace advice given to you by your health care provider. Make sure you discuss any  questions you have with your health care provider. Document Revised: 02/08/2017 Document Reviewed: 03/16/2016 Elsevier Patient Education  2020 [employer].

## 2019-09-01 NOTE — Anesthesia Postprocedure Evaluation (Signed)
Anesthesia Post Note  Patient: Eric Mason  Procedure(s) Performed: VIDEO BRONCHOSCOPY WITH ENDOBRONCHIAL ULTRASOUND (N/A ) BRONCHIAL WASHINGS     Patient location during evaluation: PACU Anesthesia Type: General Level of consciousness: awake Pain management: pain level controlled Vital Signs Assessment: post-procedure vital signs reviewed and stable Respiratory status: spontaneous breathing Cardiovascular status: stable Postop Assessment: no apparent nausea or vomiting Anesthetic complications: no   No complications documented.  Last Vitals:  Vitals:   09/01/19 0945 09/01/19 1000  BP: 105/66 101/62  Pulse: 70 65  Resp: (!) 24 20  Temp: (!) 36.3 C   SpO2: 100% 100%    Last Pain:  Vitals:   09/01/19 0945  TempSrc:   PainSc: Asleep                 Frank Novelo

## 2019-09-01 NOTE — Op Note (Signed)
Video Bronchoscopy with Endobronchial Ultrasound Procedure Note  Date of Operation: 09/01/2019  Pre-op Diagnosis: Mediastinal lymphadenopathy, history of lung cancer  Post-op Diagnosis: Same  Surgeon: Baltazar Apo  Assistants: None  Anesthesia: General endotracheal anesthesia  Operation: Flexible video fiberoptic bronchoscopy with endobronchial ultrasound and biopsies.  Estimated Blood Loss: Minimal  Complications: None apparent  Indications and History: Eric Mason is a 49 y.o. male with history of poorly differentiated metastatic carcinoma, presumed primary lung cancer.  On his surveillance CT scans he has been found to have new mediastinal lymphadenopathy concerning for active disease.  Recommendation was made to achieve tissue diagnosis via endobronchial ultrasound with biopsies.  The risks, benefits, complications, treatment options and expected outcomes were discussed with the patient.  The possibilities of pneumothorax, pneumonia, reaction to medication, pulmonary aspiration, perforation of a viscus, bleeding, failure to diagnose a condition and creating a complication requiring transfusion or operation were discussed with the patient who freely signed the consent.    Description of Procedure: The patient was examined in the preoperative area and history and data from the preprocedure consultation were reviewed. It was deemed appropriate to proceed.  The patient was taken to [facility] endoscopy room 2, identified as Eric Mason and the procedure verified as Flexible Video Fiberoptic Bronchoscopy.  A Time Out was held and the above information confirmed. After being taken to the operating room general anesthesia was initiated and the patient  was orally intubated. The video fiberoptic bronchoscope was introduced via the endotracheal tube and a general inspection was performed which showed normal airways throughout.  There were no endobronchial lesions or abnormal  secretions seen. The standard scope was then withdrawn and the endobronchial ultrasound was used to identify and characterize the peritracheal, hilar and bronchial lymph nodes. Inspection showed enlarged nodes at station 4L and 11 L.  Also noted was some irregular enlarged tissue, with vascularity, at station 7. Using real-time ultrasound guidance Wang needle biopsies were take from Station 4L, 11 L, 7 nodes and were sent for cytology. The patient tolerated the procedure well without apparent complications. There was no significant blood loss.  Finally the standard scope was reintroduced and a left upper lobe BAL was performed to be sent for microbiology.  The bronchoscope was withdrawn. Anesthesia was reversed and the patient was taken to the PACU for recovery.   Samples: 1. Wang needle biopsies from 4L node 2. Wang needle biopsies from 11 L node 3. Wang needle biopsies from 7 node 4.  Bronchoalveolar lavage from the left upper lobe  Plans:  The patient will be discharged from the PACU to home when recovered from anesthesia. We will review the cytology, pathology and microbiology results with the patient when they become available. Outpatient followup will be with Dr. Lamonte Sakai.    Baltazar Apo, MD, PhD 09/01/2019, 9:40 AM [facility] [phone] or if no answer [phone]

## 2019-09-01 NOTE — Progress Notes (Signed)
Patient resting comfortably in bed.  Blood pressure 185/99.  Dr. Lamonte Sakai at bedside and states that we can proceed with procedure.  Dr. Nyoka Cowden notified of patient's blood pressure and also stated we can proceed with procedure.

## 2019-09-01 NOTE — Transfer of Care (Signed)
Immediate Anesthesia Transfer of Care Note  Patient: Eric Mason  Procedure(s) Performed: VIDEO BRONCHOSCOPY WITH ENDOBRONCHIAL ULTRASOUND (N/A ) BRONCHIAL WASHINGS  Patient Location: PACU  Anesthesia Type:General  Level of Consciousness: awake, alert , oriented, drowsy and patient cooperative  Airway & Oxygen Therapy: Patient Spontanous Breathing and Patient connected to nasal cannula oxygen  Post-op Assessment: Report given to RN and Post -op Vital signs reviewed and stable  Post vital signs: Reviewed and stable  Last Vitals:  Vitals Value Taken Time  BP 105/66 09/01/19 0945  Temp 36.3 C 09/01/19 0945  Pulse 67 09/01/19 0947  Resp 24 09/01/19 0947  SpO2 100 % 09/01/19 0947  Vitals shown include unvalidated device data.  Last Pain:  Vitals:   09/01/19 0709  TempSrc: Oral         Complications: No complications documented.

## 2019-09-01 NOTE — Anesthesia Procedure Notes (Signed)
Procedure Name: Intubation Date/Time: 09/01/2019 8:06 AM Performed by: Raenette Rover, CRNA Pre-anesthesia Checklist: Patient identified, Emergency Drugs available, Suction available and Patient being monitored Patient Re-evaluated:Patient Re-evaluated prior to induction Oxygen Delivery Method: Circle system utilized Preoxygenation: Pre-oxygenation with 100% oxygen Induction Type: IV induction Ventilation: Mask ventilation without difficulty Laryngoscope Size: Miller and 3 Grade View: Grade I Tube type: Oral Tube size: 9.0 mm Number of attempts: 1 Airway Equipment and Method: Stylet Placement Confirmation: ETT inserted through vocal cords under direct vision,  positive ETCO2 and breath sounds checked- equal and bilateral Secured at: 23 cm Tube secured with: Tape Dental Injury: Teeth and Oropharynx as per pre-operative assessment

## 2019-09-02 LAB — CYTOLOGY - NON PAP

## 2019-09-02 LAB — ACID FAST SMEAR (AFB, MYCOBACTERIA): Acid Fast Smear: NEGATIVE

## 2019-09-03 ENCOUNTER — Encounter (HOSPITAL_COMMUNITY): Payer: Self-pay | Admitting: Emergency Medicine

## 2019-09-03 LAB — CULTURE, RESPIRATORY W GRAM STAIN: Culture: 2000 — AB

## 2019-09-04 ENCOUNTER — Telehealth: Payer: Self-pay | Admitting: Emergency Medicine

## 2019-09-04 ENCOUNTER — Ambulatory Visit (HOSPITAL_COMMUNITY): Admission: RE | Admit: 2019-09-04 | Payer: Medicaid Other | Source: Ambulatory Visit

## 2019-09-04 ENCOUNTER — Other Ambulatory Visit: Payer: Self-pay

## 2019-09-04 MED ORDER — GABAPENTIN 800 MG PO TABS: 800.0000 mg | ORAL_TABLET | Freq: Three times a day (TID) | ORAL | 0 refills | Status: DC

## 2019-09-04 NOTE — Telephone Encounter (Signed)
I was able to discuss with the patient's spouse [name]. Gave her the cytology results - all negative. Will need to follow up to determine appropriate timing of repeat imaging.

## 2019-09-04 NOTE — Telephone Encounter (Signed)
Tried to call again - he couldn't hear me. Unable to connect. Will have to try him again

## 2019-09-04 NOTE — Telephone Encounter (Signed)
Called the patient to discuss pathology results from bronchoscopy.  All of his nodal biopsies are negative on cytology for any evidence of malignancy.  He was unavailable and I left a brief message.  I will try to call him back.

## 2019-09-07 ENCOUNTER — Ambulatory Visit: Payer: Medicaid Other | Admitting: Internal Medicine

## 2019-09-08 ENCOUNTER — Telehealth: Payer: Self-pay | Admitting: *Deleted

## 2019-09-08 ENCOUNTER — Other Ambulatory Visit: Payer: Self-pay | Admitting: Radiation Therapy

## 2019-09-08 DIAGNOSIS — G9341 Metabolic encephalopathy: Secondary | ICD-10-CM | POA: Diagnosis not present

## 2019-09-08 DIAGNOSIS — R4182 Altered mental status, unspecified: Secondary | ICD-10-CM | POA: Diagnosis not present

## 2019-09-08 DIAGNOSIS — R531 Weakness: Secondary | ICD-10-CM | POA: Diagnosis not present

## 2019-09-08 DIAGNOSIS — R112 Nausea with vomiting, unspecified: Secondary | ICD-10-CM | POA: Diagnosis not present

## 2019-09-08 NOTE — Telephone Encounter (Signed)
Patients wife called and requested a call back to her to [phone] to discuss her concerns for spouse and his changes in his ability to express himself clearly.  She requested call back directly to her and not patient to number previously mentioned, attempted twice both times number no in service.  Attempted to call patient directly left message for returned call.

## 2019-09-09 ENCOUNTER — Observation Stay (HOSPITAL_COMMUNITY)
Admission: AD | Admit: 2019-09-09 | Discharge: 2019-09-10 | Disposition: A | Discharge status: Home / Self Care | Payer: Medicaid Other | Source: Other Acute Inpatient Hospital | Attending: Internal Medicine | Admitting: Internal Medicine

## 2019-09-09 DIAGNOSIS — F419 Anxiety disorder, unspecified: Secondary | ICD-10-CM | POA: Insufficient documentation

## 2019-09-09 DIAGNOSIS — G9340 Encephalopathy, unspecified: Secondary | ICD-10-CM

## 2019-09-09 DIAGNOSIS — D509 Iron deficiency anemia, unspecified: Secondary | ICD-10-CM | POA: Insufficient documentation

## 2019-09-09 DIAGNOSIS — Z20822 Contact with and (suspected) exposure to covid-19: Secondary | ICD-10-CM | POA: Diagnosis not present

## 2019-09-09 DIAGNOSIS — F1910 Other psychoactive substance abuse, uncomplicated: Secondary | ICD-10-CM | POA: Diagnosis not present

## 2019-09-09 DIAGNOSIS — K2100 Gastro-esophageal reflux disease with esophagitis, without bleeding: Secondary | ICD-10-CM | POA: Insufficient documentation

## 2019-09-09 DIAGNOSIS — F17210 Nicotine dependence, cigarettes, uncomplicated: Secondary | ICD-10-CM | POA: Diagnosis not present

## 2019-09-09 DIAGNOSIS — Z79899 Other long term (current) drug therapy: Secondary | ICD-10-CM | POA: Insufficient documentation

## 2019-09-09 DIAGNOSIS — C3490 Malignant neoplasm of unspecified part of unspecified bronchus or lung: Secondary | ICD-10-CM | POA: Diagnosis not present

## 2019-09-09 DIAGNOSIS — J439 Emphysema, unspecified: Secondary | ICD-10-CM | POA: Insufficient documentation

## 2019-09-09 DIAGNOSIS — R590 Localized enlarged lymph nodes: Secondary | ICD-10-CM | POA: Diagnosis not present

## 2019-09-09 DIAGNOSIS — Z7952 Long term (current) use of systemic steroids: Secondary | ICD-10-CM | POA: Insufficient documentation

## 2019-09-09 DIAGNOSIS — Z8249 Family history of ischemic heart disease and other diseases of the circulatory system: Secondary | ICD-10-CM | POA: Insufficient documentation

## 2019-09-09 DIAGNOSIS — I10 Essential (primary) hypertension: Secondary | ICD-10-CM | POA: Diagnosis not present

## 2019-09-09 DIAGNOSIS — F329 Major depressive disorder, single episode, unspecified: Secondary | ICD-10-CM | POA: Insufficient documentation

## 2019-09-09 DIAGNOSIS — Z515 Encounter for palliative care: Secondary | ICD-10-CM

## 2019-09-09 DIAGNOSIS — C7951 Secondary malignant neoplasm of bone: Secondary | ICD-10-CM | POA: Insufficient documentation

## 2019-09-09 DIAGNOSIS — G131 Other systemic atrophy primarily affecting central nervous system in neoplastic disease: Secondary | ICD-10-CM | POA: Diagnosis not present

## 2019-09-09 DIAGNOSIS — C7931 Secondary malignant neoplasm of brain: Principal | ICD-10-CM | POA: Insufficient documentation

## 2019-09-09 DIAGNOSIS — N179 Acute kidney failure, unspecified: Secondary | ICD-10-CM | POA: Diagnosis not present

## 2019-09-09 DIAGNOSIS — Z720 Tobacco use: Secondary | ICD-10-CM | POA: Diagnosis present

## 2019-09-09 LAB — CBC WITH DIFFERENTIAL/PLATELET
Abs Immature Granulocytes: 0.02 10*3/uL (ref 0.00–0.07)
Basophils Absolute: 0 10*3/uL (ref 0.0–0.1)
Basophils Relative: 0 %
Eosinophils Absolute: 0 10*3/uL (ref 0.0–0.5)
Eosinophils Relative: 0 %
HCT: 34.6 % — ABNORMAL LOW (ref 39.0–52.0)
Hemoglobin: 10.9 g/dL — ABNORMAL LOW (ref 13.0–17.0)
Immature Granulocytes: 0 %
Lymphocytes Relative: 52 %
Lymphs Abs: 5.2 10*3/uL — ABNORMAL HIGH (ref 0.7–4.0)
MCH: 24.8 pg — ABNORMAL LOW (ref 26.0–34.0)
MCHC: 31.5 g/dL (ref 30.0–36.0)
MCV: 78.8 fL — ABNORMAL LOW (ref 80.0–100.0)
Monocytes Absolute: 0.6 10*3/uL (ref 0.1–1.0)
Monocytes Relative: 6 %
Neutro Abs: 4.1 10*3/uL (ref 1.7–7.7)
Neutrophils Relative %: 42 %
Platelets: 269 10*3/uL (ref 150–400)
RBC: 4.39 MIL/uL (ref 4.22–5.81)
RDW: 22.4 % — ABNORMAL HIGH (ref 11.5–15.5)
WBC: 9.9 10*3/uL (ref 4.0–10.5)
nRBC: 0 % (ref 0.0–0.2)

## 2019-09-09 LAB — COMPREHENSIVE METABOLIC PANEL
ALT: 22 U/L (ref 0–44)
AST: 19 U/L (ref 15–41)
Albumin: 3.7 g/dL (ref 3.5–5.0)
Alkaline Phosphatase: 56 U/L (ref 38–126)
Anion gap: 9 (ref 5–15)
BUN: 35 mg/dL — ABNORMAL HIGH (ref 6–20)
CO2: 23 mmol/L (ref 22–32)
Calcium: 9.1 mg/dL (ref 8.9–10.3)
Chloride: 104 mmol/L (ref 98–111)
Creatinine, Ser: 1.1 mg/dL (ref 0.61–1.24)
GFR calc Af Amer: >60 mL/min (ref >60)
GFR calc non Af Amer: >60 mL/min (ref >60)
Glucose, Bld: 104 mg/dL — ABNORMAL HIGH (ref 70–99)
Potassium: 4.5 mmol/L (ref 3.5–5.1)
Sodium: 136 mmol/L (ref 135–145)
Total Bilirubin: 0.3 mg/dL (ref 0.3–1.2)
Total Protein: 7.2 g/dL (ref 6.5–8.1)

## 2019-09-09 LAB — URINALYSIS, ROUTINE W REFLEX MICROSCOPIC
Bilirubin Urine: NEGATIVE
Glucose, UA: 50 mg/dL — AB
Hgb urine dipstick: NEGATIVE
Ketones, ur: NEGATIVE mg/dL
Leukocytes,Ua: NEGATIVE
Nitrite: NEGATIVE
Protein, ur: NEGATIVE mg/dL
Specific Gravity, Urine: 1.014 (ref 1.005–1.030)
pH: 5 (ref 5.0–8.0)

## 2019-09-09 LAB — MAGNESIUM: Magnesium: 1.8 mg/dL (ref 1.7–2.4)

## 2019-09-09 LAB — PHOSPHORUS: Phosphorus: 2.8 mg/dL (ref 2.5–4.6)

## 2019-09-09 LAB — SARS CORONAVIRUS 2 BY RT PCR (HOSPITAL ORDER, PERFORMED IN ~~LOC~~ HOSPITAL LAB): SARS Coronavirus 2: NEGATIVE

## 2019-09-09 LAB — TSH: TSH: 1.31 u[IU]/mL (ref 0.350–4.500)

## 2019-09-09 MED ORDER — SODIUM CHLORIDE 0.9% FLUSH
10.0000 mL | INTRAVENOUS | Status: DC | PRN
  Administered 2019-09-10: 10 mL

## 2019-09-09 MED ORDER — DEXAMETHASONE 2 MG PO TABS
1.0000 mg | ORAL_TABLET | Freq: Every day | ORAL | Status: DC
  Administered 2019-09-10: 1 mg via ORAL
  Filled 2019-09-09: qty 1

## 2019-09-09 MED ORDER — B COMPLEX-C PO TABS
1.0000 | ORAL_TABLET | Freq: Every day | ORAL | Status: DC
  Administered 2019-09-10: 1 via ORAL
  Filled 2019-09-09: qty 1

## 2019-09-09 MED ORDER — POLYETHYLENE GLYCOL 3350 17 G PO PACK: 17.0000 g | PACK | Freq: Every day | ORAL | Status: DC | PRN

## 2019-09-09 MED ORDER — ONDANSETRON HCL 4 MG PO TABS: 4.0000 mg | ORAL_TABLET | Freq: Four times a day (QID) | ORAL | Status: DC | PRN

## 2019-09-09 MED ORDER — PANTOPRAZOLE SODIUM 40 MG PO TBEC
40.0000 mg | DELAYED_RELEASE_TABLET | Freq: Two times a day (BID) | ORAL | Status: DC
  Administered 2019-09-09 – 2019-09-10 (×2): 40 mg via ORAL
  Filled 2019-09-09 (×2): qty 1

## 2019-09-09 MED ORDER — AMITRIPTYLINE HCL 25 MG PO TABS
75.0000 mg | ORAL_TABLET | Freq: Every day | ORAL | Status: DC
  Administered 2019-09-09: 75 mg via ORAL
  Filled 2019-09-09: qty 3

## 2019-09-09 MED ORDER — ACETAMINOPHEN 650 MG RE SUPP: 650.0000 mg | Freq: Four times a day (QID) | RECTAL | Status: DC | PRN

## 2019-09-09 MED ORDER — DULOXETINE HCL 30 MG PO CPEP
30.0000 mg | ORAL_CAPSULE | Freq: Every day | ORAL | Status: DC
  Administered 2019-09-10: 30 mg via ORAL
  Filled 2019-09-09: qty 1

## 2019-09-09 MED ORDER — PROMETHAZINE HCL 25 MG PO TABS: 25.0000 mg | ORAL_TABLET | Freq: Four times a day (QID) | ORAL | Status: DC | PRN

## 2019-09-09 MED ORDER — ALBUTEROL SULFATE (2.5 MG/3ML) 0.083% IN NEBU: 2.5000 mg | INHALATION_SOLUTION | Freq: Four times a day (QID) | RESPIRATORY_TRACT | Status: DC | PRN

## 2019-09-09 MED ORDER — FOLIC ACID 1 MG PO TABS
1.0000 mg | ORAL_TABLET | Freq: Every day | ORAL | Status: DC
  Administered 2019-09-10: 1 mg via ORAL
  Filled 2019-09-09: qty 1

## 2019-09-09 MED ORDER — ADULT MULTIVITAMIN W/MINERALS CH
1.0000 | ORAL_TABLET | Freq: Every day | ORAL | Status: DC
  Administered 2019-09-10: 1 via ORAL
  Filled 2019-09-09: qty 1

## 2019-09-09 MED ORDER — OXYCODONE HCL 5 MG PO TABS
15.0000 mg | ORAL_TABLET | ORAL | Status: DC | PRN
  Administered 2019-09-09 – 2019-09-10 (×2): 15 mg via ORAL
  Filled 2019-09-09 (×2): qty 3

## 2019-09-09 MED ORDER — VITAMIN B-1 100 MG PO TABS
100.0000 mg | ORAL_TABLET | Freq: Every day | ORAL | Status: DC
  Filled 2019-09-09: qty 1

## 2019-09-09 MED ORDER — HEPARIN SODIUM (PORCINE) 5000 UNIT/ML IJ SOLN
5000.0000 [IU] | Freq: Three times a day (TID) | INTRAMUSCULAR | Status: DC
  Administered 2019-09-09 – 2019-09-10 (×2): 5000 [IU] via SUBCUTANEOUS
  Filled 2019-09-09 (×2): qty 1

## 2019-09-09 MED ORDER — SODIUM CHLORIDE 0.9% FLUSH
10.0000 mL | Freq: Two times a day (BID) | INTRAVENOUS | Status: DC
  Administered 2019-09-09: 10 mL

## 2019-09-09 MED ORDER — GABAPENTIN 400 MG PO CAPS
800.0000 mg | ORAL_CAPSULE | Freq: Three times a day (TID) | ORAL | Status: DC
  Administered 2019-09-09 – 2019-09-10 (×2): 800 mg via ORAL
  Filled 2019-09-09 (×2): qty 2

## 2019-09-09 MED ORDER — GABAPENTIN 800 MG PO TABS
800.0000 mg | ORAL_TABLET | Freq: Three times a day (TID) | ORAL | Status: DC
  Filled 2019-09-09: qty 1

## 2019-09-09 MED ORDER — LEVETIRACETAM 500 MG PO TABS
2000.0000 mg | ORAL_TABLET | Freq: Two times a day (BID) | ORAL | Status: DC
  Administered 2019-09-09 – 2019-09-10 (×2): 2000 mg via ORAL
  Filled 2019-09-09 (×2): qty 4

## 2019-09-09 MED ORDER — BACLOFEN 10 MG PO TABS
10.0000 mg | ORAL_TABLET | Freq: Two times a day (BID) | ORAL | Status: DC
  Administered 2019-09-09 – 2019-09-10 (×2): 10 mg via ORAL
  Filled 2019-09-09 (×2): qty 1

## 2019-09-09 MED ORDER — FLUCONAZOLE 100 MG PO TABS
200.0000 mg | ORAL_TABLET | Freq: Every day | ORAL | Status: DC
  Administered 2019-09-10: 200 mg via ORAL
  Filled 2019-09-09: qty 2

## 2019-09-09 MED ORDER — SODIUM CHLORIDE 0.9 % IV SOLN: INTRAVENOUS | Status: DC

## 2019-09-09 MED ORDER — BUSPIRONE HCL 5 MG PO TABS
5.0000 mg | ORAL_TABLET | Freq: Two times a day (BID) | ORAL | Status: DC
  Administered 2019-09-09 – 2019-09-10 (×2): 5 mg via ORAL
  Filled 2019-09-09 (×2): qty 1

## 2019-09-09 MED ORDER — FERROUS SULFATE 325 (65 FE) MG PO TABS
325.0000 mg | ORAL_TABLET | Freq: Every day | ORAL | Status: DC
  Administered 2019-09-10: 325 mg via ORAL
  Filled 2019-09-09: qty 1

## 2019-09-09 MED ORDER — ACETAMINOPHEN 325 MG PO TABS: 650.0000 mg | ORAL_TABLET | Freq: Four times a day (QID) | ORAL | Status: DC | PRN

## 2019-09-09 MED ORDER — CHLORHEXIDINE GLUCONATE CLOTH 2 % EX PADS
6.0000 | MEDICATED_PAD | Freq: Every day | CUTANEOUS | Status: DC
  Administered 2019-09-10: 6 via TOPICAL

## 2019-09-09 MED ORDER — ONDANSETRON HCL 4 MG/2ML IJ SOLN: 4.0000 mg | Freq: Four times a day (QID) | INTRAMUSCULAR | Status: DC | PRN

## 2019-09-09 MED ORDER — SENNOSIDES-DOCUSATE SODIUM 8.6-50 MG PO TABS
2.0000 | ORAL_TABLET | Freq: Two times a day (BID) | ORAL | Status: DC
  Filled 2019-09-09 (×2): qty 2

## 2019-09-09 NOTE — Plan of Care (Signed)
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
## 2019-09-10 ENCOUNTER — Other Ambulatory Visit: Payer: Self-pay | Admitting: Oncology

## 2019-09-10 DIAGNOSIS — K2100 Gastro-esophageal reflux disease with esophagitis, without bleeding: Secondary | ICD-10-CM | POA: Diagnosis not present

## 2019-09-10 DIAGNOSIS — D509 Iron deficiency anemia, unspecified: Secondary | ICD-10-CM | POA: Diagnosis not present

## 2019-09-10 DIAGNOSIS — C7931 Secondary malignant neoplasm of brain: Secondary | ICD-10-CM

## 2019-09-10 DIAGNOSIS — Z20822 Contact with and (suspected) exposure to covid-19: Secondary | ICD-10-CM | POA: Diagnosis not present

## 2019-09-10 DIAGNOSIS — M79604 Pain in right leg: Secondary | ICD-10-CM

## 2019-09-10 DIAGNOSIS — J439 Emphysema, unspecified: Secondary | ICD-10-CM | POA: Diagnosis not present

## 2019-09-10 DIAGNOSIS — F17210 Nicotine dependence, cigarettes, uncomplicated: Secondary | ICD-10-CM | POA: Diagnosis not present

## 2019-09-10 DIAGNOSIS — C3490 Malignant neoplasm of unspecified part of unspecified bronchus or lung: Secondary | ICD-10-CM

## 2019-09-10 DIAGNOSIS — Z7952 Long term (current) use of systemic steroids: Secondary | ICD-10-CM | POA: Diagnosis not present

## 2019-09-10 DIAGNOSIS — Z79899 Other long term (current) drug therapy: Secondary | ICD-10-CM | POA: Diagnosis not present

## 2019-09-10 DIAGNOSIS — R590 Localized enlarged lymph nodes: Secondary | ICD-10-CM | POA: Diagnosis not present

## 2019-09-10 DIAGNOSIS — F329 Major depressive disorder, single episode, unspecified: Secondary | ICD-10-CM | POA: Diagnosis not present

## 2019-09-10 DIAGNOSIS — F419 Anxiety disorder, unspecified: Secondary | ICD-10-CM | POA: Diagnosis not present

## 2019-09-10 DIAGNOSIS — G131 Other systemic atrophy primarily affecting central nervous system in neoplastic disease: Secondary | ICD-10-CM | POA: Diagnosis not present

## 2019-09-10 DIAGNOSIS — Z8249 Family history of ischemic heart disease and other diseases of the circulatory system: Secondary | ICD-10-CM | POA: Diagnosis not present

## 2019-09-10 DIAGNOSIS — N179 Acute kidney failure, unspecified: Secondary | ICD-10-CM | POA: Diagnosis present

## 2019-09-10 DIAGNOSIS — K921 Melena: Secondary | ICD-10-CM

## 2019-09-10 DIAGNOSIS — I10 Essential (primary) hypertension: Secondary | ICD-10-CM | POA: Diagnosis not present

## 2019-09-10 DIAGNOSIS — C7951 Secondary malignant neoplasm of bone: Secondary | ICD-10-CM | POA: Diagnosis not present

## 2019-09-10 DIAGNOSIS — F1910 Other psychoactive substance abuse, uncomplicated: Secondary | ICD-10-CM | POA: Diagnosis not present

## 2019-09-10 DIAGNOSIS — C763 Malignant neoplasm of pelvis: Secondary | ICD-10-CM

## 2019-09-10 LAB — COMPREHENSIVE METABOLIC PANEL
ALT: 20 U/L (ref 0–44)
AST: 15 U/L (ref 15–41)
Albumin: 3.3 g/dL — ABNORMAL LOW (ref 3.5–5.0)
Alkaline Phosphatase: 52 U/L (ref 38–126)
Anion gap: 6 (ref 5–15)
BUN: 24 mg/dL — ABNORMAL HIGH (ref 6–20)
CO2: 24 mmol/L (ref 22–32)
Calcium: 8.5 mg/dL — ABNORMAL LOW (ref 8.9–10.3)
Chloride: 106 mmol/L (ref 98–111)
Creatinine, Ser: 0.96 mg/dL (ref 0.61–1.24)
GFR calc Af Amer: >60 mL/min (ref >60)
GFR calc non Af Amer: >60 mL/min (ref >60)
Glucose, Bld: 88 mg/dL (ref 70–99)
Potassium: 4.2 mmol/L (ref 3.5–5.1)
Sodium: 136 mmol/L (ref 135–145)
Total Bilirubin: 0.3 mg/dL (ref 0.3–1.2)
Total Protein: 6.4 g/dL — ABNORMAL LOW (ref 6.5–8.1)

## 2019-09-10 LAB — CBC
HCT: 32.7 % — ABNORMAL LOW (ref 39.0–52.0)
Hemoglobin: 10.1 g/dL — ABNORMAL LOW (ref 13.0–17.0)
MCH: 24.7 pg — ABNORMAL LOW (ref 26.0–34.0)
MCHC: 30.9 g/dL (ref 30.0–36.0)
MCV: 80 fL (ref 80.0–100.0)
Platelets: 278 10*3/uL (ref 150–400)
RBC: 4.09 MIL/uL — ABNORMAL LOW (ref 4.22–5.81)
RDW: 22.7 % — ABNORMAL HIGH (ref 11.5–15.5)
WBC: 8 10*3/uL (ref 4.0–10.5)
nRBC: 0 % (ref 0.0–0.2)

## 2019-09-10 LAB — GLUCOSE, CAPILLARY: Glucose-Capillary: 82 mg/dL (ref 70–99)

## 2019-09-10 MED ORDER — THIAMINE HCL 100 MG PO TABS
100.0000 mg | ORAL_TABLET | Freq: Every day | ORAL | Status: DC
  Administered 2019-09-10: 100 mg via ORAL
  Filled 2019-09-10: qty 1

## 2019-09-10 MED ORDER — HEPARIN SOD (PORK) LOCK FLUSH 100 UNIT/ML IV SOLN
500.0000 [IU] | INTRAVENOUS | Status: AC | PRN
  Administered 2019-09-10: 500 [IU]
  Filled 2019-09-10: qty 5

## 2019-09-10 MED ORDER — ONE-DAILY MULTI VITAMINS PO TABS: 1.0000 | ORAL_TABLET | Freq: Every day | ORAL | 0 refills | Status: AC

## 2019-09-10 NOTE — Evaluation (Signed)
Occupational Therapy Evaluation Patient Details Name: Eric Mason MRN: [id number] DOB: 1970/07/11 Today's Date: 09/10/2019    History of Present Illness Morrie Daywalt is a 49 y.o. 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 This time patient admitted for altered mental status and AKI.   Clinical Impression   Mr. Daimon Kean is a 49 year old man who presents with normal ROM and strength of upper extremities, ability to perform bed mobility, transfers and ambulation in room without assistance. Patient demonstrated ability to donn shoes and stand at sink to perform grooming task. Patient is independent with ADLs and reports toileting without assistance. No OT needs at this time.    Follow Up Recommendations  No OT follow up    Equipment Recommendations  None recommended by OT    Recommendations for Other Services       Precautions / Restrictions Precautions Precautions: None      Mobility Bed Mobility Overal bed mobility: Modified Independent             General bed mobility comments: increased time to perform bed transfer.  Transfers Overall transfer level: Modified independent               General transfer comment: BUE assisting to rise, good steadinesss upon rising. Slow gait and patient reporting feeling "stiff."    Balance Overall balance assessment: No apparent balance deficits (not formally assessed)                                         ADL either performed or assessed with clinical judgement   ADL Overall ADL's : Independent                                        General ADL Comments: Patient demonstrates ability to perform mobility, donn shoes, ambulate to bathroom and perform grooming standing at sink.     Vision   Vision Assessment?: No apparent visual deficits     Perception     Praxis      Pertinent Vitals/Pain Pain Assessment: No/denies pain     Hand Dominance     Extremity/Trunk Assessment Upper Extremity Assessment Upper Extremity Assessment: Overall WFL for tasks assessed   Lower Extremity Assessment Lower Extremity Assessment: Defer to PT evaluation   Cervical / Trunk Assessment Cervical / Trunk Assessment: Normal   Communication Communication Communication: No difficulties   Cognition Arousal/Alertness: Awake/alert Behavior During Therapy: WFL for tasks assessed/performed Overall Cognitive Status: Within Functional Limits for tasks assessed                                     General Comments       Exercises     Shoulder Instructions      Home Living Family/patient expects to be discharged to:: Private residence Living Arrangements: Spouse/significant other Available Help at Discharge: Family;Available PRN/intermittently Type of Home: House Home Access: Ramped entrance     Home Layout:  One level     Bathroom Shower/Tub: Walk-in shower         Home Equipment: Environmental consultant - 2 wheels;Cane - single point;Bedside commode;Wheelchair - manual          Prior Functioning/Environment Level of Independence: Independent        Comments: Reports he doesn't drive.        OT Problem List:        OT Treatment/Interventions:      OT Goals(Current goals can be found in the care plan section) Acute Rehab OT Goals OT Goal Formulation: Patient unable to participate in goal setting  OT Frequency:     Barriers to D/C:            Co-evaluation              AM-PAC OT "6 Clicks" Daily Activity     Outcome Measure Help from another person eating meals?: None Help from another person  taking care of personal grooming?: None Help from another person toileting, which includes using toliet, bedpan, or urinal?: None Help from another person bathing (including washing, rinsing, drying)?: None Help from another person to put on and taking off regular upper body clothing?: None Help from another person to put on and taking off regular lower body clothing?: None 6 Click Score: 24   End of Session Nurse Communication:  (okay to see patient per RN)  Activity Tolerance: Patient tolerated treatment well Patient left: in bed  OT Visit Diagnosis: Other symptoms and signs involving cognitive function                Time: [id number] OT Time Calculation (min): 8 min Charges:  OT General Charges $OT Visit: 1 Visit  Derl Barrow, OTR/L [place]  Office [phone] Pager: [phone]   Lenward Chancellor 09/10/2019, 10:53 AM

## 2019-09-10 NOTE — Evaluation (Signed)
Physical Therapy One Time Evaluation Patient Details Name: Eric Mason MRN: [id number] DOB: 1970/08/08 Today's Date: 09/10/2019   History of Present Illness  Eric Mason is a 49 y.o. 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 This time patient admitted for altered mental status and AKI.  Clinical Impression  Patient evaluated by Physical Therapy with no further acute PT needs identified. All education has been completed and the patient has no further questions.  Pt mobilizing very well and no skilled PT needs identified at this time. See below for any follow-up Physical Therapy or equipment needs. PT is signing off. Thank you for this referral.     Follow Up Recommendations No PT follow up    Equipment Recommendations  None recommended by PT    Recommendations for Other Services       Precautions / Restrictions Precautions Precautions: None      Mobility  Bed Mobility Overal bed mobility: Modified Independent             General bed mobility comments: increased time to perform bed transfer.  Transfers Overall transfer level: Modified independent               General transfer comment: BUE assisting to rise, good steadinesss upon rising. Slow gait and patient reporting feeling "stiff."  Ambulation/Gait Ambulation/Gait assistance: Independent;Modified independent (Device/Increase time) Gait Distance (Feet): 350 Feet Assistive device: IV Pole Gait Pattern/deviations: WFL(Within Functional Limits)     General Gait Details: pt pushed IV pole however not required for UE support, HR 67-72 bpm, pt denies any  symptoms  Stairs            Wheelchair Mobility    Modified Rankin (Stroke Patients Only)       Balance Overall balance assessment: No apparent balance deficits (not formally assessed)                                           Pertinent Vitals/Pain Pain Assessment: No/denies pain    Home Living Family/patient expects to be discharged to:: Private residence Living Arrangements: Spouse/significant other Available Help at Discharge: Family;Available PRN/intermittently Type of Home: House Home Access: Ramped entrance     Home Layout: One level Home Equipment: Walker - 2 wheels;Cane - single point;Bedside commode;Wheelchair - manual      Prior Function Level of Independence: Independent         Comments: Reports he doesn't drive.     Hand Dominance        Extremity/Trunk Assessment   Upper Extremity Assessment Upper Extremity Assessment: Overall WFL for tasks assessed    Lower Extremity Assessment Lower Extremity Assessment: Overall WFL for tasks assessed    Cervical / Trunk Assessment Cervical / Trunk Assessment: Normal  Communication   Communication: No difficulties  Cognition Arousal/Alertness: Awake/alert Behavior During Therapy: WFL for tasks assessed/performed Overall Cognitive Status: Within Functional Limits for tasks assessed  General Comments      Exercises     Assessment/Plan    PT Assessment Patent does not need any further PT services  PT Problem List         PT Treatment Interventions      PT Goals (Current goals can be found in the Care Plan section)  Acute Rehab PT Goals PT Goal Formulation: All assessment and education complete, DC therapy    Frequency     Barriers to discharge        Co-evaluation               AM-PAC PT "6 Clicks" Mobility  Outcome Measure Help needed turning from your back to your side while in a flat bed  without using bedrails?: None Help needed moving from lying on your back to sitting on the side of a flat bed without using bedrails?: None Help needed moving to and from a bed to a chair (including a wheelchair)?: None Help needed standing up from a chair using your arms (e.g., wheelchair or bedside chair)?: None Help needed to walk in hospital room?: None Help needed climbing 3-5 steps with a railing? : None 6 Click Score: 24    End of Session   Activity Tolerance: Patient tolerated treatment well Patient left: in bed;with call bell/phone within reach   PT Visit Diagnosis: Other abnormalities of gait and mobility (R26.89)    Time: 0272-5366 PT Time Calculation (min) (ACUTE ONLY): 9 min   Charges:   PT Evaluation $PT Eval Low Complexity: 1 Low        Kati PT, DPT Acute Rehabilitation Services Pager: [phone] Office: [phone]  Thelbert Gartin,KATHrine E 09/10/2019, 11:29 AM

## 2019-09-10 NOTE — Progress Notes (Signed)
Port de-access: pt with questions about port site care. Informed him it is OK to swim and take baths as long as port is de-accessed.

## 2019-09-10 NOTE — Discharge Summary (Signed)
Physician Discharge Summary  Dontrae Morini XLK:[id number] DOB: 03-04-1971 DOA: 09/09/2019  PCP: [name], MD  Admit date: 09/09/2019 Discharge date: 09/10/2019  Admitted From: Home Disposition: Home  Recommendations for Outpatient Follow-up:  1. Follow up with PCP in 1-2 weeks 2. Follow up with Medical and Neuro-Oncology within 1-2 weeks 3. Follow up with Gastroenterology within 1-2 weeks 4. Please obtain CMP/CBC, Mag, Phos in one week 5. Please follow up on the following pending results:  Home Health: No Equipment/Devices: None  Discharge Condition: Stable  CODE STATUS: FULL CODE Diet recommendation: Heart Healthy Diet  Brief/Interim Summary: Richrd Kuzniar is a 49 y.o. 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  At that time was having poor oral intake, multiple episodes of vomiting and diarrhea and he was found to have a severe AKI with a creatinine of 6.31.  Subsequently after that discharge he states that he has not been eating well and states that he started "vaping heavily "and states that he is to hang out in a wax house.  He was found by his stepdaughter who stated that he was extremely confused and upon presentation to the [facility] he was found to have an AKI again and his UDS was positive for amphetamines, cannabis, cocaine, as well as oxycodone.  He was transferred to [facility] for further evaluation management of his acute encephalopathy as well as AKI and subsequently he started improving and by the time he got to the [facility] he is alert and oriented x3.  TRH was asked admit this patient for his  acute encephalopathy as well as AKI.  At the time of my evaluation patient denies any chest pain, lightheadedness or dizziness.  Burning or discomfort.  States that he has had diminished urinary output and is complaining of some lower abdominal pain and fatigue.  States that he has not been eating very well.  No other concerns or complaints noted  ED Course: In the [facility] ED he had a head CT scan done.  He was given 2 L of normal saline boluses, but was placed on LR as well as normal saline, and had basic blood work done.  **Interim History His renal function recovered well and he improved significantly.  His mentation status was back to baseline and PT OT evaluated and recommending no home health.  Prior to discharge he mentioned that he had a dark stool yesterday but has not had any since.  Hemoglobin slightly dropped however likely is a dilutional drop.  I spoke with the gastroenterologist Dr. Paulita Fujita who feels no need for inpatient evaluation and recommends the patient follow-up with his primary gastroenterologist for a small bowel evaluation as well given that he recently had endoscopy and colonoscopy back in May.  Patient at this time is medically stable to be discharged and he was advised to follow-up with his primary gastroenterologist if he continues to have black tarry stools.  Patient understands agrees and he is stable for discharge at this time and will need to follow-up with PCP, gastroenterology, medical oncology as well as neuro oncology in outpatient setting.  Discharge Diagnoses:  Active Problems:   Brain metastasis ([place])   Tobacco abuse   Lung cancer (  [place])   Pulmonary emphysema ([place])   ARF (acute renal failure) ([place])   Palliative care by specialist   AKI (acute kidney injury) ([place])  Acute encephalopathy in the setting of uremia and polysubstance abuse, improved significantly -Improving -Patient had a BUN/creatinine of 81/4.95 at [facility] and repeat this  a.m. showed a BUN/creatinine of 54/1.82 -Is given 2 L of IV fluid and placed on LR as well as normal saline at [facility] -He had a head CT scan done which was read as "prior right parietal craniotomy with a small underlying area of encephalomalacia.  The low-attenuation in the substernal region of the left frontal lobe also likely representative of encephalomalacia..  No acute intracranial abnormality noted. -On presentation he is awake and alert and oriented x4 -Of note his UDS was positive for amphetamines, cannabis, cocaine, oxycodone -Patient states that he was vaping and that is how he thinks the cocaine got in. -Patient's mental status is improved from what was reported and will continue IV fluid hydration.  Patient is asking for diet now. -We will check TSH this was 1.310 -Check COVID Test this is negative -C/w Levitiracetam 2000 mg po BID -Consider obtaining EEG but he is back to baseline -We will obtain PT OT to further evaluate and treat and they have no record recommendations  AKI, improved -In the setting of poor p.o. intake -Patient states that he has not been eating or drinking well since he left the hospital last -Patient BUN/creatinine on arrival to [facility] was 81/4.95.  Repeat this morning at [facility] was 54/1.82.  Repeat labs here -Avoid nephrotoxic medications, contrast dyes, hypotension and will hold his amlodipine and lisinopril -Continue IV fluid hydration with normal saline at 100 mL's per hour while hospitalized -Was complaining of some abdominal Discomfort; Consider obtaining further imaging  -Continue monitor and trend renal function repeat CMP in a.m.  -Check Urinalysis and this was normal -Since his renal function improved and he tolerated diet without issues he will be discharged home and follow-up with PCP  Polysubstance Abuse -Counseling given -Patient states its from "heavy vaping" -Tested Positive for Amphetamines, Cannabis,  Cocaine, and Oxycodone -Social Work for Resources  -Follow-up in outpatient setting  Black stool -Could be from iron supplementation however patient had black tarry stool prior to discharge last time is transfused 2 units of PRBCs  -Underwent recent endoscopy and colonoscopy with no evidence of bleeding found so he is made referral for small bowel evaluation at that point which she declined -I ran the case by gastroenterologist Dr. Arta Silence who feels no need for inpatient work-up and feels it could be a combination of things likely dilutional drop or possible ischemic in the setting of his cocaine use -Patient is BUN slightly was elevated compared to his creatinine and was 24 but improved in the setting of his renal failure -Dr. Arta Silence recommends outpatient follow-up with his primary gastroenterologist at [facility] and if necessary if the patient does not want to go see them then he can follow-up with [facility] gastroenterology as an outpatient  -Repeat CBC and CMP within 1 week and avoid NSAIDs   Microcytic Anemia -Patient's Hgb/Hct was 10.9/34.6 on Admission and was down from 12.1 back in the [facility].  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 Alen Blew -Will notify Dr. Alen Blew via Epic as a Courtesy -Recently underwent Lung Bx done by Dr Lamonte Sakai for a Flexible Video Fiberoptic Bronchoscopy with Endobronchial U/S and Bx for new Mediastinal Lymphadenopathy -Cytology is still  pending from that Bx -C/w Baclofen, Dexamethasone, and Oxycodone prn along with Bowel Regimen -C/w Levetiracetam 2000 mg po BID  -Follow-up with pulmonary in outpatient setting  GERD/GI bleed/esophagitis -C/w Pantoprazole 40 mg po BID and with fluconazole for candidal esophagitis: His fluconazole was for 10-day treatment.  Given that he is completed 10 days since his last discharge from 08/21/2019  COPD -Currently not in exacerbation -C/w 2.5 mg Neb IH q6hprn Wheezing and SOB while hospitalized room home inhaler discharge  Depression and Anxiety -C/w Amitriptyline 75 mg po qHS, Buspirone 5 mg po BID   Discharge Instructions  Discharge Instructions    Call MD for:  difficulty breathing, headache or visual disturbances   Complete by: As directed    Call MD for:  extreme fatigue   Complete by: As directed    Call MD for:  hives   Complete by: As directed    Call MD for:  persistant dizziness or light-headedness   Complete by: As directed    Call MD for:  persistant nausea and vomiting   Complete by: As directed    Call MD for:  redness, tenderness, or signs of infection (pain, swelling, redness, odor or green/yellow discharge around incision site)   Complete by: As directed    Call MD for:  severe uncontrolled pain   Complete by: As directed    Call MD for:  temperature >100.4   Complete by: As directed    Diet - low sodium heart healthy   Complete by: As directed    Diet - low sodium heart healthy   Complete by: As directed    Discharge instructions   Complete by: As directed    You were cared for by a hospitalist during your hospital stay. If you have any questions about your discharge medications or the care you received while you were in the hospital after you are discharged, you can call the unit and ask to speak with the hospitalist on call if the hospitalist that took care of you is not available. Once you are discharged, your primary care physician will handle any  further medical issues. Please note that NO REFILLS for any discharge medications will be authorized once you are discharged, as it is imperative that you return to your primary care physician (or establish a relationship with a primary care physician if you do not have one) for your aftercare needs so that they can reassess your need for medications and monitor your lab values.  Follow up with PCP, Medical Oncology, and Gastroenterology within 1-2 weeks. Take all medications as prescribed. If symptoms change or worsen please return to the ED for evaluation   Increase activity slowly   Complete by: As directed    Increase activity slowly   Complete by: As directed      Allergies as of 09/10/2019      Reactions   Latex Itching, Rash      Medication List    STOP taking these medications  amLODipine 5 MG tablet Commonly known as: NORVASC   Calcium Carb-Cholecalciferol 500-400 MG-UNIT Tabs Commonly known as: Calcium 500 +D   fluconazole 200 MG tablet Commonly known as: DIFLUCAN   lisinopril 20 MG tablet Commonly known as: ZESTRIL     TAKE these medications   albuterol 108 (90 Base) MCG/ACT inhaler Commonly known as: VENTOLIN HFA Inhale 2 puffs into the lungs every 6 (six) hours as needed for wheezing or shortness of breath.(200/8=25) What changed: See the new instructions.   amitriptyline 75 MG tablet Commonly known as: ELAVIL Take 1 tablet (75 mg total) by mouth at bedtime.   B-complex with vitamin C tablet Take 1 tablet by mouth daily.   baclofen 10 MG tablet Commonly known as: LIORESAL Take 1 tablet (10 mg total) by mouth 2 (two) times daily.   bisacodyl 5 MG EC tablet Commonly known as: DULCOLAX Take 2 tablets (10 mg total) by mouth daily as needed for moderate constipation.   busPIRone 5 MG tablet Commonly known as: BUSPAR Take 1 tablet (5 mg total) by mouth 2 (two) times daily.   dexamethasone 2 MG tablet Commonly known as: DECADRON Take 0.5 tablets (1 mg  total) by mouth daily.   DULoxetine 30 MG capsule Commonly known as: CYMBALTA Take 1 capsule (30 mg total) by mouth daily.   folic acid 1 MG tablet Commonly known as: FOLVITE Take 1 tablet (1 mg total) by mouth daily.   gabapentin 800 MG tablet Commonly known as: NEURONTIN Take 1 tablet (800 mg total) by mouth 3 (three) times daily.   levETIRAcetam 1000 MG tablet Commonly known as: KEPPRA Take 2 tablets (2,000 mg total) by mouth 2 (two) times daily.   multivitamin tablet Take 1 tablet by mouth daily.   oxyCODONE 15 MG immediate release tablet Commonly known as: ROXICODONE Take 1 tablet (15 mg total) by mouth every 4 (four) hours as needed for pain.   pantoprazole 40 MG tablet Commonly known as: PROTONIX Take 1 tablet (40 mg total) by mouth 2 (two) times daily.   polyethylene glycol 17 g packet Commonly known as: MIRALAX / GLYCOLAX Take 17 g by mouth daily as needed for moderate constipation.   promethazine 25 MG tablet Commonly known as: PHENERGAN Take 1 tablet (25 mg total) by mouth every 6 (six) hours as needed for nausea or vomiting.   senna-docusate 8.6-50 MG tablet Commonly known as: Senokot-S Take 2 tablets by mouth 2 (two) times daily.   thiamine 100 MG tablet Commonly known as: Vitamin B-1 Take 1 tablet (100 mg total) by mouth daily.   Vitamin D 50 MCG (2000 UT) Caps Take 2,000 Units by mouth daily.       Follow-up Information    [name], MD. Call.   Specialty: Internal Medicine Why: Follow up within 1 week  Contact information: [place] [address] [phone]        Gastroenterology, [facility]. Call.   Specialty: Gastroenterology Why: Follow up within 1-2 weeks Contact information: [place] [address] [phone]              Allergies  Allergen Reactions  . Latex Itching and Rash   Consultations:  None  Procedures/Studies: CT ABDOMEN PELVIS WO CONTRAST  Result  Date: 08/17/2019 CLINICAL DATA:  Abdominal pain.  History of metastatic lung cancer. EXAM: CT ABDOMEN AND PELVIS WITHOUT CONTRAST TECHNIQUE: Multidetector CT imaging of the abdomen and pelvis was performed following the standard protocol  without IV contrast. COMPARISON:  CT dated February 03, 2019. FINDINGS: Lower chest: Emphysematous changes are again noted. There is some chronic scarring and atelectasis in the right middle lobe and lingula.The heart size is normal. Hepatobiliary: The liver is normal. Normal gallbladder.There is no biliary ductal dilation. Pancreas: Normal contours without ductal dilatation. No peripancreatic fluid collection. Spleen: Unremarkable. Adrenals/Urinary Tract: --Adrenal glands: Unremarkable. --Right kidney/ureter: No hydronephrosis or radiopaque kidney stones. --Left kidney/ureter: No hydronephrosis or radiopaque kidney stones. --Urinary bladder: Unremarkable. Stomach/Bowel: --Stomach/Duodenum: No hiatal hernia or other gastric abnormality. Normal duodenal course and caliber. --Small bowel: Unremarkable. --Colon: Unremarkable. --Appendix: Normal. Vascular/Lymphatic: Atherosclerotic calcification is present within the non-aneurysmal abdominal aorta, without hemodynamically significant stenosis. --No retroperitoneal lymphadenopathy. --No mesenteric lymphadenopathy. --No pelvic or inguinal lymphadenopathy. Reproductive: Unremarkable Other: No ascites or free air. The abdominal wall is normal. Musculoskeletal. No acute displaced fractures. IMPRESSION: No acute abnormality.  Chronic findings as detailed above. Electronically Signed   By: Constance Holster M.D.   On: 08/17/2019 20:05   CT Head Wo Contrast  Result Date: 08/17/2019 CLINICAL DATA:  Altered level of consciousness, history of metastatic lung cancer with brain metastases EXAM: CT HEAD WITHOUT CONTRAST TECHNIQUE: Contiguous axial images were obtained from the base of the skull through the vertex without intravenous contrast.  COMPARISON:  06/10/2019 FINDINGS: Brain: Hypodensity within the insula, with areas of cortical calcification, compatible with known intracranial metastatic disease. Please refer to recent MRI findings. Postsurgical changes are seen from right parietal craniotomy with underlying encephalomalacia, compatible with prior resection of metastatic disease. No acute infarct or hemorrhage. Lateral ventricles and midline structures are unremarkable. No acute extra-axial fluid collections. Vascular: No hyperdense vessel or unexpected calcification. Skull: Postsurgical changes from right parietal craniotomy. No acute bony abnormalities. Sinuses/Orbits: No acute finding. Other: None. IMPRESSION: 1. Hypodensity left insula consistent with known intracranial metastatic disease. Please refer to prior MRI findings. 2. No acute infarct or hemorrhage. Electronically Signed   By: Randa Ngo M.D.   On: 08/17/2019 20:04   DG ESOPHAGUS W DOUBLE CM (HD)  Result Date: 08/20/2019 CLINICAL DATA:  Food sticking in the mid esophagus EXAM: ESOPHOGRAM / BARIUM SWALLOW / BARIUM TABLET STUDY TECHNIQUE: Combined double contrast and single contrast examination performed using effervescent crystals, thick barium liquid, and thin barium liquid. The patient was observed with fluoroscopy swallowing a 13 mm barium sulphate tablet. FLUOROSCOPY TIME:  Fluoroscopy Time:  2.2 minutes Radiation Exposure Index (if provided by the fluoroscopic device): 17.7 mGy Number of Acquired Spot Images: 0 COMPARISON:  CT abdomen 08/17/2019 FINDINGS: During the pharyngeal phase of swallowing, there was flash penetration of the larynx without overt tracheal aspiration. Mild distal esophageal fold thickening on the mucosal relief images. No esophageal stricture or ulceration is identified. Distal esophageal secondary and tertiary contractions are noted. There is proximal escape of contrast on all swallows, an appearance suggesting mild but nonspecific esophageal  dysmotility. A 13 mm barium tablet passed briskly into the stomach. IMPRESSION: 1. Mild distal esophageal fold thickening may reflect esophagitis. No ulceration or stricture identified. 2. Mild nonspecific esophageal dysmotility, with proximal escape contrast on all swallows, and distal esophageal secondary and tertiary contractions. 3. Flash laryngeal penetration, without tracheal aspiration. Electronically Signed   By: Van Clines M.D.   On: 08/20/2019 12:41     Subjective: Examined at bedside and felt well.  Had no issues.  Had a black stool yesterday but states that he is not complain of any pain.  I spoke with gastroenterology and they recommend  outpatient follow-up with his primary gastroenterologist [facility].  Note lightheadedness or dizziness.  No other concerns or complaints at this time.  Discharge Exam: Vitals:   09/10/19 0208 09/10/19 0503  BP: 100/66 95/65  Pulse: 75 76  Resp: 19 19  Temp: 98.7 F (37.1 C) 98.9 F (37.2 C)  SpO2: 100% 99%   Vitals:   09/09/19 1604 09/09/19 2136 09/10/19 0208 09/10/19 0503  BP: 108/82 97/62 100/66 95/65  Pulse: 74 72 75 76  Resp: 20 20 19 19   Temp: 98.8 F (37.1 C) 98.9 F (37.2 C) 98.7 F (37.1 C) 98.9 F (37.2 C)  TempSrc: Oral Oral Oral Oral  SpO2: 100% 100% 100% 99%  Weight:    68.2 kg   General: Pt is alert, awake, not in acute distress Cardiovascular: RRR, S1/S2 +, no rubs, no gallops Respiratory: Diminished bilaterally, no wheezing, no rhonchi Abdominal: Soft, NT, ND, bowel sounds + Extremities: no edema, no cyanosis  The results of significant diagnostics from this hospitalization (including imaging, microbiology, ancillary and laboratory) are listed below for reference.    Microbiology: Recent Results (from the past 240 hour(s))  SARS CORONAVIRUS 2 (TAT 6-24 HRS) Nasopharyngeal Nasopharyngeal Swab     Status: None   Collection Time: 08/31/19  1:28 PM   Specimen: Nasopharyngeal Swab  Result Value Ref Range  Status   SARS Coronavirus 2 NEGATIVE NEGATIVE Final    Comment: (NOTE) SARS-CoV-2 target nucleic acids are NOT DETECTED.  The SARS-CoV-2 RNA is generally detectable in upper and lower respiratory specimens during the acute phase of infection. Negative results do not preclude SARS-CoV-2 infection, do not rule out co-infections with other pathogens, and should not be used as the sole basis for treatment or other patient management decisions. Negative results must be combined with clinical observations, patient history, and epidemiological information. The expected result is Negative.  Fact Sheet for Patients: SugarRoll.be  Fact Sheet for Healthcare Providers: https://www.woods-mathews.com/  This test is not yet approved or cleared by the [place] FDA and  has been authorized for detection and/or diagnosis of SARS-CoV-2 by FDA under an Emergency Use Authorization (EUA). This EUA will remain  in effect (meaning this test can be used) for the duration of the COVID-19 declaration under Se ction 564(b)(1) of the Act, 21 U.S.C. section 360bbb-3(b)(1), unless the authorization is terminated or revoked sooner.  Performed at [facility], [place] [address], [place], [place] [address]   Culture, respiratory     Status: Abnormal   Collection Time: 09/01/19  9:31 AM   Specimen: Bronchial Alveolar Lavage; Respiratory  Result Value Ref Range Status   Specimen Description Bronch Lavag  Final   Special Requests NONE  Final   Gram Stain   Final    FEW WBC PRESENT, PREDOMINANTLY MONONUCLEAR NO ORGANISMS SEEN    Culture (A)  Final    2,000 COLONIES/mL GROUP B STREP(S.AGALACTIAE)ISOLATED TESTING AGAINST S. AGALACTIAE NOT ROUTINELY PERFORMED DUE TO PREDICTABILITY OF AMP/PEN/VAN SUSCEPTIBILITY. Performed at [facility], [place] [address], [place], [place] [address]    Report Status 09/03/2019 FINAL  Final  Acid Fast Smear (AFB)     Status:  None   Collection Time: 09/01/19  9:31 AM   Specimen: Bronchial Alveolar Lavage; Respiratory  Result Value Ref Range Status   AFB Specimen Processing Concentration  Final   Acid Fast Smear Negative  Final    Comment: (NOTE) Performed At: [facility], [place] [id number] Rush Farmer MD  JO:[id number]    Source (AFB) BRONCHIAL ALVEOLAR LAVAGE  Final    Comment: Performed at [facility], [place] [address] [place], [place] [address]  SARS Coronavirus 2 by RT PCR (hospital order, performed in [facility] hospital lab) Nasopharyngeal Nasopharyngeal Swab     Status: None   Collection Time: 09/09/19  6:31 PM   Specimen: Nasopharyngeal Swab  Result Value Ref Range Status   SARS Coronavirus 2 NEGATIVE NEGATIVE Final    Comment: (NOTE) SARS-CoV-2 target nucleic acids are NOT DETECTED.  The SARS-CoV-2 RNA is generally detectable in upper and lower respiratory specimens during the acute phase of infection. The lowest concentration of SARS-CoV-2 viral copies this assay can detect is 250 copies / mL. A negative result does not preclude SARS-CoV-2 infection and should not be used as the sole basis for treatment or other patient management decisions.  A negative result may occur with improper specimen collection / handling, submission of specimen other than nasopharyngeal swab, presence of viral mutation(s) within the areas targeted by this assay, and inadequate number of viral copies (<250 copies / mL). A negative result must be combined with clinical observations, patient history, and epidemiological information.  Fact Sheet for Patients:   StrictlyIdeas.no  Fact Sheet for Healthcare Providers: BankingDealers.co.za  This test is not yet approved or  cleared by the [place] FDA and has been authorized for detection and/or diagnosis of SARS-CoV-2 by FDA under an Emergency Use Authorization (EUA).  This  EUA will remain in effect (meaning this test can be used) for the duration of the COVID-19 declaration under Section 564(b)(1) of the Act, 21 U.S.C. section 360bbb-3(b)(1), unless the authorization is terminated or revoked sooner.  Performed at [facility], [place] [address], [place], [place] [address]     Labs BNP (last 3 results) No results for input(s): BNP in the last 8760 hours. Basic Metabolic Panel: Recent Labs  Lab 09/09/19 1658 09/10/19 0425  NA 136 136  K 4.5 4.2  CL 104 106  CO2 23 24  GLUCOSE 104* 88  BUN 35* 24*  CREATININE 1.10 0.96  CALCIUM 9.1 8.5*  MG 1.8  --   PHOS 2.8  --    Liver Function Tests: Recent Labs  Lab 09/09/19 1658 09/10/19 0425  AST 19 15  ALT 22 20  ALKPHOS 56 52  BILITOT 0.3 0.3  PROT 7.2 6.4*  ALBUMIN 3.7 3.3*   No results for input(s): LIPASE, AMYLASE in the last 168 hours. No results for input(s): AMMONIA in the last 168 hours. CBC: Recent Labs  Lab 09/09/19 1658 09/10/19 0425  WBC 9.9 8.0  NEUTROABS 4.1  --   HGB 10.9* 10.1*  HCT 34.6* 32.7*  MCV 78.8* 80.0  PLT 269 278   Cardiac Enzymes: No results for input(s): CKTOTAL, CKMB, CKMBINDEX, TROPONINI in the last 168 hours. BNP: Invalid input(s): POCBNP CBG: Recent Labs  Lab 09/10/19 0736  GLUCAP 82   D-Dimer No results for input(s): DDIMER in the last 72 hours. Hgb A1c No results for input(s): HGBA1C in the last 72 hours. Lipid Profile No results for input(s): CHOL, HDL, LDLCALC, TRIG, CHOLHDL, LDLDIRECT in the last 72 hours. Thyroid function studies Recent Labs    09/09/19 1658  TSH 1.310   Anemia work up No results for input(s): VITAMINB12, FOLATE, FERRITIN, TIBC, IRON, RETICCTPCT in the last 72 hours. Urinalysis    Component Value Date/Time   COLORURINE YELLOW 09/09/2019 1930   APPEARANCEUR CLEAR 09/09/2019 1930  LABSPEC 1.014 09/09/2019 1930   PHURINE 5.0 09/09/2019 1930   GLUCOSEU 50 (A) 09/09/2019 1930   HGBUR NEGATIVE  09/09/2019 1930   BILIRUBINUR NEGATIVE 09/09/2019 1930   KETONESUR NEGATIVE 09/09/2019 1930   PROTEINUR NEGATIVE 09/09/2019 1930   NITRITE NEGATIVE 09/09/2019 1930   LEUKOCYTESUR NEGATIVE 09/09/2019 1930   Sepsis Labs Invalid input(s): PROCALCITONIN,  WBC,  LACTICIDVEN Microbiology Recent Results (from the past 240 hour(s))  SARS CORONAVIRUS 2 (TAT 6-24 HRS) Nasopharyngeal Nasopharyngeal Swab     Status: None   Collection Time: 08/31/19  1:28 PM   Specimen: Nasopharyngeal Swab  Result Value Ref Range Status   SARS Coronavirus 2 NEGATIVE NEGATIVE Final    Comment: (NOTE) SARS-CoV-2 target nucleic acids are NOT DETECTED.  The SARS-CoV-2 RNA is generally detectable in upper and lower respiratory specimens during the acute phase of infection. Negative results do not preclude SARS-CoV-2 infection, do not rule out co-infections with other pathogens, and should not be used as the sole basis for treatment or other patient management decisions. Negative results must be combined with clinical observations, patient history, and epidemiological information. The expected result is Negative.  Fact Sheet for Patients: SugarRoll.be  Fact Sheet for Healthcare Providers: https://www.woods-mathews.com/  This test is not yet approved or cleared by the [place] FDA and  has been authorized for detection and/or diagnosis of SARS-CoV-2 by FDA under an Emergency Use Authorization (EUA). This EUA will remain  in effect (meaning this test can be used) for the duration of the COVID-19 declaration under Se ction 564(b)(1) of the Act, 21 U.S.C. section 360bbb-3(b)(1), unless the authorization is terminated or revoked sooner.  Performed at [facility], [place] [address] [place], [place] [address]   Culture, respiratory     Status: Abnormal   Collection Time: 09/01/19  9:31 AM   Specimen: Bronchial Alveolar Lavage; Respiratory  Result Value Ref Range  Status   Specimen Description Bronch Lavag  Final   Special Requests NONE  Final   Gram Stain   Final    FEW WBC PRESENT, PREDOMINANTLY MONONUCLEAR NO ORGANISMS SEEN    Culture (A)  Final    2,000 COLONIES/mL GROUP B STREP(S.AGALACTIAE)ISOLATED TESTING AGAINST S. AGALACTIAE NOT ROUTINELY PERFORMED DUE TO PREDICTABILITY OF AMP/PEN/VAN SUSCEPTIBILITY. Performed at [facility], [place] [address] [place], [place] [address]    Report Status 09/03/2019 FINAL  Final  Acid Fast Smear (AFB)     Status: None   Collection Time: 09/01/19  9:31 AM   Specimen: Bronchial Alveolar Lavage; Respiratory  Result Value Ref Range Status   AFB Specimen Processing Concentration  Final   Acid Fast Smear Negative  Final    Comment: (NOTE) Performed At: [facility] [place], [place] [id number] Rush Farmer MD BO:[id number]    Source (AFB) BRONCHIAL ALVEOLAR LAVAGE  Final    Comment: Performed at [facility], [place] [address] [place], [place] [address]  SARS Coronavirus 2 by RT PCR (hospital order, performed in [facility] hospital lab) Nasopharyngeal Nasopharyngeal Swab     Status: None   Collection Time: 09/09/19  6:31 PM   Specimen: Nasopharyngeal Swab  Result Value Ref Range Status   SARS Coronavirus 2 NEGATIVE NEGATIVE Final    Comment: (NOTE) SARS-CoV-2 target nucleic acids are NOT DETECTED.  The SARS-CoV-2 RNA is generally detectable in upper and lower respiratory specimens during the acute phase of infection. The lowest concentration of SARS-CoV-2 viral copies this assay can detect is 250  copies / mL. A negative result does not preclude SARS-CoV-2 infection and should not be used as the sole basis for treatment or other patient management decisions.  A negative result may occur with improper specimen collection / handling, submission of specimen other than nasopharyngeal swab, presence of viral mutation(s) within the areas targeted by this assay, and  inadequate number of viral copies (<250 copies / mL). A negative result must be combined with clinical observations, patient history, and epidemiological information.  Fact Sheet for Patients:   StrictlyIdeas.no  Fact Sheet for Healthcare Providers: BankingDealers.co.za  This test is not yet approved or  cleared by the [place] FDA and has been authorized for detection and/or diagnosis of SARS-CoV-2 by FDA under an Emergency Use Authorization (EUA).  This EUA will remain in effect (meaning this test can be used) for the duration of the COVID-19 declaration under Section 564(b)(1) of the Act, 21 U.S.C. section 360bbb-3(b)(1), unless the authorization is terminated or revoked sooner.  Performed at [facility], [place] [address], [place], [place] [address]    Time coordinating discharge: 35 minutes  SIGNED:  Kerney Elbe, DO [facility] 09/10/2019, 12:01 PM Pager is on [place]  If 7PM-7AM, please contact night-coverage [email or web address]

## 2019-09-11 ENCOUNTER — Other Ambulatory Visit: Payer: Self-pay | Admitting: *Deleted

## 2019-09-11 MED ORDER — DEXAMETHASONE 2 MG PO TABS: 1.0000 mg | ORAL_TABLET | Freq: Every day | ORAL | 0 refills | Status: DC

## 2019-09-11 NOTE — Telephone Encounter (Signed)
Received vm message from pt requesting refill of his steroids.  Refill sent in. VM message left for pt to advise of this refill.

## 2019-09-15 ENCOUNTER — Telehealth: Payer: Self-pay

## 2019-09-15 NOTE — Telephone Encounter (Signed)
Transition Care Management Follow-up Telephone Call Date of discharge and from where:09/10/2019, [facility]   Call placed to patient, his wife answered the phone.  She said that she was with him and he was driving.  Instructed him to return the call to this CM when he has a chance.  He has an appt with [name] 09/22/2019.

## 2019-09-17 ENCOUNTER — Telehealth: Payer: Self-pay | Admitting: Internal Medicine

## 2019-09-17 ENCOUNTER — Inpatient Hospital Stay: Admission: RE | Admit: 2019-09-17 | Payer: Medicaid Other | Source: Ambulatory Visit

## 2019-09-17 NOTE — Telephone Encounter (Signed)
Please f/u   Copied from [place] [phone]. Topic: General - Other >> Sep 17, 2019  4:04 PM Rainey Pines A wrote: [facility] case management is requesting a callback from Dr Bard Herbert nurse at [phone].

## 2019-09-18 NOTE — Telephone Encounter (Signed)
Returned call to [facility] and spoke to [place]. Per stephanie she wanted pcp to know that pt has been to the ED ca couple of times. She wanted to confirm that pt has a f/u appt with pcp. Pt is scheduled to see pcp on 7/13. Per stephanie she wants pcp to know that pt is requesting Nicoderm patches. Also she is wanting to know if pcp can write prescriptions for pt otc vitamins. Per stephanie pt has picked up medications but he didn't have his Thiamine.

## 2019-09-20 MED ORDER — NICOTINE 21 MG/24HR TD PT24: 21.0000 mg | MEDICATED_PATCH | Freq: Every day | TRANSDERMAL | 0 refills | Status: DC

## 2019-09-21 ENCOUNTER — Inpatient Hospital Stay: Payer: Medicaid Other

## 2019-09-21 ENCOUNTER — Inpatient Hospital Stay: Payer: Medicaid Other | Admitting: Internal Medicine

## 2019-09-21 ENCOUNTER — Telehealth: Payer: Self-pay | Admitting: Internal Medicine

## 2019-09-21 NOTE — Telephone Encounter (Signed)
Scheduled appt per 7/12 sch msg - pt is aware of appt date and time

## 2019-09-22 ENCOUNTER — Ambulatory Visit: Payer: Medicaid Other | Admitting: Internal Medicine

## 2019-09-23 ENCOUNTER — Telehealth: Payer: Self-pay

## 2019-09-23 NOTE — Telephone Encounter (Signed)
Nutrition Assessment:   Patient identified on Malnutrition Screening report for weight loss and poor appetite.  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  Recently underwent lung biopsy.  Noted multiple hospital admissions recently   Spoke with patient via phone and introduce self and service at [facility].  Patient reports that he is throwing up right now.  Not feeling well.  Reports that he has taken some phenergan.  Reports that he ate a big supper last night and has been throwing today.  Reports that he has a "cold" and coughing up green stuff.      Medications: reviewed  Labs: reviewed  Anthropometrics:   Height: 70 inches Weight: 150 lb 5.7 on 7/1 Noted 159 lb on 5/14 BMI: 21  6% weight loss in the last month and half, significant   NUTRITION DIAGNOSIS: Inadequate oral intake related to altered GI function (nausea/vomting) and multiple hospital admissions as evidenced by 6% weight loss in the last month and half and decreased appetite.    INTERVENTION:  Discussed briefly strategies to help with nausea. Stressed importance of calling MD's office with symptoms.  Patient states that he has an appointment tomorrow. Noted seeing pulmonology tomorrow (7/15)     MONITORING, EVALUATION, GOAL: weight trends, intake   NEXT VISIT: as needed  Eric Mason, [place], [place] Registered Dietitian [phone] (pager)

## 2019-09-24 ENCOUNTER — Ambulatory Visit: Payer: Medicaid Other | Admitting: Emergency Medicine

## 2019-09-24 ENCOUNTER — Encounter: Payer: Self-pay | Admitting: Genetic Counselor

## 2019-09-29 ENCOUNTER — Other Ambulatory Visit: Payer: Self-pay | Admitting: Medical

## 2019-09-29 DIAGNOSIS — M79605 Pain in left leg: Secondary | ICD-10-CM

## 2019-09-29 DIAGNOSIS — C763 Malignant neoplasm of pelvis: Secondary | ICD-10-CM

## 2019-09-29 DIAGNOSIS — C3490 Malignant neoplasm of unspecified part of unspecified bronchus or lung: Secondary | ICD-10-CM

## 2019-09-29 DIAGNOSIS — C7931 Secondary malignant neoplasm of brain: Secondary | ICD-10-CM

## 2019-09-29 DIAGNOSIS — G893 Neoplasm related pain (acute) (chronic): Secondary | ICD-10-CM

## 2019-10-02 ENCOUNTER — Telehealth: Payer: Self-pay | Admitting: Internal Medicine

## 2019-10-02 NOTE — Telephone Encounter (Signed)
Please advise.   Copied from [place] [phone]. Topic: General - Inquiry >> Oct 01, 2019  3:03 PM Alease Frame wrote: Reason for CRM: Frankey Poot called from [facility] called wanting to speak with someone regrding pts recent medication. Please call  Call back [phone]

## 2019-10-02 NOTE — Telephone Encounter (Signed)
Returned Frankey Poot call regarding pt. Per Larkin Ina he wanted to see if pt had folic acid 1 mg on med list. Made him aware that the rx was last prescribed back in 2018.

## 2019-10-06 ENCOUNTER — Telehealth: Payer: Self-pay

## 2019-10-06 ENCOUNTER — Other Ambulatory Visit: Payer: Self-pay | Admitting: Oncology

## 2019-10-06 DIAGNOSIS — M79604 Pain in right leg: Secondary | ICD-10-CM

## 2019-10-06 DIAGNOSIS — C3490 Malignant neoplasm of unspecified part of unspecified bronchus or lung: Secondary | ICD-10-CM

## 2019-10-06 DIAGNOSIS — C763 Malignant neoplasm of pelvis: Secondary | ICD-10-CM

## 2019-10-06 DIAGNOSIS — C7931 Secondary malignant neoplasm of brain: Secondary | ICD-10-CM

## 2019-10-06 MED ORDER — OXYCODONE HCL 15 MG PO TABS: 15.0000 mg | ORAL_TABLET | ORAL | 0 refills | Status: DC | PRN

## 2019-10-06 NOTE — Telephone Encounter (Signed)
Nutrition  Received message from Karmen Stabs with [facility].  RD called Juliann Pulse back today. She is patient's case worker.  She was requesting assistance with oral nutrition supplements for patient if possible.  Informed Juliann Pulse that RD would leave a case of ensure in [facility] for patient to pick up tomorrow after MD appointment. Juliann Pulse said she would call and tell patient.   Yvette Roark B. Zenia Resides, [place], [place] Registered Dietitian [phone] (mobile)

## 2019-10-07 ENCOUNTER — Other Ambulatory Visit: Payer: Self-pay

## 2019-10-07 ENCOUNTER — Inpatient Hospital Stay: Payer: Medicaid Other | Attending: Internal Medicine | Admitting: Oncology

## 2019-10-07 VITALS — BP 125/80 | HR 83 | Temp 97.9°F | Resp 18 | Ht 70.0 in | Wt 157.7 lb

## 2019-10-07 DIAGNOSIS — C3490 Malignant neoplasm of unspecified part of unspecified bronchus or lung: Secondary | ICD-10-CM

## 2019-10-07 DIAGNOSIS — Z923 Personal history of irradiation: Secondary | ICD-10-CM | POA: Diagnosis not present

## 2019-10-07 DIAGNOSIS — Z79899 Other long term (current) drug therapy: Secondary | ICD-10-CM | POA: Insufficient documentation

## 2019-10-07 DIAGNOSIS — C7800 Secondary malignant neoplasm of unspecified lung: Secondary | ICD-10-CM | POA: Diagnosis present

## 2019-10-07 DIAGNOSIS — C7931 Secondary malignant neoplasm of brain: Secondary | ICD-10-CM | POA: Diagnosis not present

## 2019-10-07 DIAGNOSIS — Z7952 Long term (current) use of systemic steroids: Secondary | ICD-10-CM | POA: Insufficient documentation

## 2019-10-07 DIAGNOSIS — G893 Neoplasm related pain (acute) (chronic): Secondary | ICD-10-CM | POA: Insufficient documentation

## 2019-10-07 DIAGNOSIS — C801 Malignant (primary) neoplasm, unspecified: Secondary | ICD-10-CM | POA: Diagnosis present

## 2019-10-07 DIAGNOSIS — N179 Acute kidney failure, unspecified: Secondary | ICD-10-CM | POA: Diagnosis not present

## 2019-10-07 DIAGNOSIS — C7951 Secondary malignant neoplasm of bone: Secondary | ICD-10-CM | POA: Insufficient documentation

## 2019-10-07 NOTE — Progress Notes (Signed)
Hematology and Oncology Follow Up   Eric Mason [id number] 08/21/1970 49 y.o. 10/07/2019 12:41 PM Eric Mason, [name], Eric Batman, MD       Principle Diagnosis: 49 year old with poorly differentiated cancer with pulmonary and CNS involvement diagnosed in 2018.     Prior Therapy:Status post craniotomy and resection in May 2018. This was followed by [facility] completed in June 2018.  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  Carboplatin and paclitaxel with Pembrolizumab cycle 1 given on 06/18/2017.  He completed 4 cycles of carboplatin and paclitaxel and completed 6 cycles of Pembrolizumab total.  He is status post repeat radiation to L5 presumed tumor ablated in June 2020.  Current therapy: Active surveillance.   Interim History: Eric Mason presents today for a follow-up visit.  Since the last visit, he underwent pulmonary evaluation including endoscopy and biopsy which did not show any malignancy in his thoracic lymphadenopathy.  He has been hospitalized on few occasions related to poor p.o. intake nausea vomiting and dehydration.  He is doing better at this time maintaining adequate oral intake.     Medications: Updated without changes. Current Outpatient Medications  Medication Sig Dispense Refill  . albuterol (VENTOLIN HFA) 108 (90 Base) MCG/ACT inhaler Inhale 2 puffs into the lungs every 6 (six) hours as needed for wheezing or shortness of breath.(200/8=25) (Patient taking differently: Inhale 2 puffs into the lungs every 6 (six) hours as needed for wheezing or shortness of breath. ) 8.5 g 6  . amitriptyline (ELAVIL) 75 MG tablet Take 1 tablet (75 mg total) by mouth at bedtime. 30 tablet 3  . B Complex-C (B-COMPLEX WITH VITAMIN C) tablet Take 1 tablet by mouth daily. (Patient not taking: Reported on 09/09/2019)    . baclofen (LIORESAL) 10 MG  tablet Take 1 tablet (10 mg total) by mouth 2 (two) times daily. 60 each 3  . bisacodyl (DULCOLAX) 5 MG EC tablet Take 2 tablets (10 mg total) by mouth daily as needed for moderate constipation. 30 tablet 0  . busPIRone (BUSPAR) 5 MG tablet Take 1 tablet (5 mg total) by mouth 2 (two) times daily. 60 tablet 1  . Cholecalciferol (VITAMIN D) 50 MCG (2000 UT) CAPS Take 2,000 Units by mouth daily.    [name] dexamethasone (DECADRON) 2 MG tablet Take 0.5 tablets (1 mg total) by mouth daily. 30 tablet 0  . DULoxetine (CYMBALTA) 30 MG capsule Take 1 capsule (30 mg total) by mouth daily. 30 capsule 5  . folic acid (FOLVITE) 1 MG tablet Take 1 tablet (1 mg total) by mouth daily. 100 tablet 1  . gabapentin (NEURONTIN) 800 MG tablet Take 1 tablet (800 mg total) by mouth 3 (three) times daily. 90 tablet 0  . levETIRAcetam (KEPPRA) 1000 MG tablet Take 2 tablets (2,000 mg total) by mouth 2 (two) times daily. 120 tablet 3  . Multiple Vitamin (MULTIVITAMIN) tablet Take 1 tablet by mouth daily. 30 tablet 0  . nicotine (NICODERM CQ - DOSED IN MG/24 HOURS) 21 mg/24hr patch Place 1 patch (21 mg total) onto the skin daily. 28 patch 0  . oxyCODONE (ROXICODONE) 15 MG immediate release tablet Take 1 tablet (15 mg total) by mouth every 4 (four) hours as needed for pain. 126 tablet 0  . pantoprazole (PROTONIX) 40 MG tablet Take 1 tablet (40 mg total) by mouth 2 (two) times daily. 60 tablet 1  . polyethylene glycol (MIRALAX /  GLYCOLAX) 17 g packet Take 17 g by mouth daily as needed for moderate constipation.    . promethazine (PHENERGAN) 25 MG tablet Take 1 tablet (25 mg total) by mouth every 6 (six) hours as needed for nausea or vomiting. 30 tablet 0  . senna-docusate (SENOKOT-S) 8.6-50 MG tablet Take 2 tablets by mouth 2 (two) times daily. (Patient not taking: Reported on 09/09/2019) 30 tablet 1  . thiamine (VITAMIN B-1) 100 MG tablet Take 1 tablet (100 mg total) by mouth daily. (Patient not taking: Reported on 09/09/2019) 100 tablet  0   No current facility-administered medications for this visit.   Facility-Administered Medications Ordered in Other Visits  Medication Dose Route Frequency Provider Last Rate Last Admin  . sodium chloride flush (NS) 0.9 % injection 10 mL  10 mL Intracatheter PRN Eric Portela, MD   10 mL at 06/18/17 1815     Allergies:  Allergies  Allergen Reactions  . Latex Itching and Rash     Physical examination:   Blood pressure 125/80, pulse 83, temperature 97.9 F (36.6 C), temperature source Temporal, resp. rate 18, height 5\' 10"  (1.778 m), weight 157 lb 11.2 oz (71.5 kg), SpO2 99 %.   ECOG 1   General appearance: Comfortable appearing without any discomfort Head: Normocephalic without any trauma Oropharynx: Mucous membranes are moist and pink without any thrush or ulcers. Eyes: Pupils are equal and round reactive to light. Lymph nodes: No cervical, supraclavicular, inguinal or axillary lymphadenopathy.   Heart:regular rate and rhythm.  S1 and S2 without leg edema. Lung: Clear without any rhonchi or wheezes.  No dullness to percussion. Abdomin: Soft, nontender, nondistended with good bowel sounds.  No hepatosplenomegaly. Musculoskeletal: No joint deformity or effusion.  Full range of motion noted. Neurological: No deficits noted on motor, sensory and deep tendon reflex exam. Skin: No petechial rash or dryness.  Appeared moist.           Lab Results: Lab Results  Component Value Date   WBC 8.0 09/10/2019   HGB 10.1 (L) 09/10/2019   HCT 32.7 (L) 09/10/2019   MCV 80.0 09/10/2019   PLT 278 09/10/2019     Chemistry      Component Value Date/Time   NA 136 09/10/2019 0425   NA 137 10/30/2016 0909   K 4.2 09/10/2019 0425   K 3.3 (L) 10/30/2016 0909   CL 106 09/10/2019 0425   CO2 24 09/10/2019 0425   CO2 27 10/30/2016 0909   BUN 24 (H) 09/10/2019 0425   BUN 4.8 (L) 10/30/2016 0909   CREATININE 0.96 09/10/2019 0425   CREATININE 0.83 02/03/2019 0839   CREATININE  0.9 10/30/2016 0909      Component Value Date/Time   CALCIUM 8.5 (L) 09/10/2019 0425   CALCIUM 9.3 10/30/2016 0909   ALKPHOS 52 09/10/2019 0425   ALKPHOS 93 10/30/2016 0909   AST 15 09/10/2019 0425   AST 14 (L) 02/03/2019 0839   AST 35 (H) 10/30/2016 0909   ALT 20 09/10/2019 0425   ALT 11 02/03/2019 0839   ALT 25 10/30/2016 0909   BILITOT 0.3 09/10/2019 0425   BILITOT 0.4 02/03/2019 0839   BILITOT 0.41 10/30/2016 0909     I     Impression and Plan:  49 year old man with:  1.    Poorly differentiated malignancy diagnosed in 2018 with pulmonary and CNS involvement.   Imaging studies in May 2021 showed enlargement of his thoracic adenopathy that has been biopsied in June 2021 which showed  no malignancy.  The differential diagnosis of these findings were reviewed today.  Metastatic malignancy versus reactive findings are consideration.  Given his negative biopsy I agree with repeat imaging studies down the line.  I will continue to follow him closely and I anticipate that the repeat lung imaging will be done by pulmonary medicine and will make sure that happens in the future.  2.  CNS and spinal metastasis: No evidence of metastatic disease progression at this time.  Continues to follow with neuro-oncology.  3.  Acute renal failure: No issues reported at this time.  He is able to eat and drink better.  4.  Pain: Diffuse and predominantly in his spine related to his malignancy.  Oxycodone was refilled for him monthly.   5. Follow-up:  He will return in 3 months for repeat evaluation.  30  minutes were dedicated to this encounter.  The time was spent on reviewing his disease status, discussing treatment options and future plan of care review.    Zola Button, MD 10/07/2019 12:41 PM

## 2019-10-12 ENCOUNTER — Telehealth: Payer: Self-pay | Admitting: Oncology

## 2019-10-12 ENCOUNTER — Ambulatory Visit
Admission: RE | Admit: 2019-10-12 | Discharge: 2019-10-12 | Disposition: A | Discharge status: Home / Self Care | Payer: Medicaid Other | Source: Ambulatory Visit | Attending: Internal Medicine | Admitting: Internal Medicine

## 2019-10-12 ENCOUNTER — Other Ambulatory Visit: Payer: Self-pay

## 2019-10-12 DIAGNOSIS — C7931 Secondary malignant neoplasm of brain: Secondary | ICD-10-CM | POA: Diagnosis not present

## 2019-10-12 DIAGNOSIS — C3490 Malignant neoplasm of unspecified part of unspecified bronchus or lung: Secondary | ICD-10-CM | POA: Diagnosis not present

## 2019-10-12 MED ORDER — GADOBENATE DIMEGLUMINE 529 MG/ML IV SOLN
15.0000 mL | Freq: Once | INTRAVENOUS | Status: AC | PRN
  Administered 2019-10-12: 15 mL via INTRAVENOUS

## 2019-10-12 NOTE — Telephone Encounter (Signed)
Scheduled per 07/28 los, patient has been called and notified.

## 2019-10-14 LAB — ACID FAST CULTURE WITH REFLEXED SENSITIVITIES (MYCOBACTERIA): Acid Fast Culture: NEGATIVE

## 2019-10-19 ENCOUNTER — Telehealth: Payer: Self-pay

## 2019-10-19 ENCOUNTER — Inpatient Hospital Stay: Payer: Medicaid Other | Attending: Internal Medicine

## 2019-10-19 ENCOUNTER — Telehealth: Payer: Self-pay | Admitting: Internal Medicine

## 2019-10-19 ENCOUNTER — Inpatient Hospital Stay: Payer: Medicaid Other | Admitting: Internal Medicine

## 2019-10-19 DIAGNOSIS — C7931 Secondary malignant neoplasm of brain: Secondary | ICD-10-CM | POA: Insufficient documentation

## 2019-10-19 DIAGNOSIS — Z803 Family history of malignant neoplasm of breast: Secondary | ICD-10-CM | POA: Insufficient documentation

## 2019-10-19 DIAGNOSIS — F17210 Nicotine dependence, cigarettes, uncomplicated: Secondary | ICD-10-CM | POA: Insufficient documentation

## 2019-10-19 DIAGNOSIS — R519 Headache, unspecified: Secondary | ICD-10-CM | POA: Insufficient documentation

## 2019-10-19 DIAGNOSIS — C7800 Secondary malignant neoplasm of unspecified lung: Secondary | ICD-10-CM | POA: Insufficient documentation

## 2019-10-19 DIAGNOSIS — J449 Chronic obstructive pulmonary disease, unspecified: Secondary | ICD-10-CM | POA: Insufficient documentation

## 2019-10-19 DIAGNOSIS — Z79899 Other long term (current) drug therapy: Secondary | ICD-10-CM | POA: Insufficient documentation

## 2019-10-19 DIAGNOSIS — C801 Malignant (primary) neoplasm, unspecified: Secondary | ICD-10-CM | POA: Insufficient documentation

## 2019-10-19 NOTE — Telephone Encounter (Signed)
Received TC from patient wife stating that patient needs to cancel appointment for today due to transportation and would like to be rescheduled this week. Canceled appointment and sent schedule message to get appointment rescheduled. Dr Mickeal Skinner also made aware

## 2019-10-19 NOTE — Telephone Encounter (Signed)
R/s 89 appt per sch msg. Called and confirmed appt with patient.

## 2019-10-20 NOTE — Progress Notes (Signed)
Nutrition  Received message from Karmen Stabs, caseworker for patient.  Returned [employer] phone call and she will give patient RD's contact information so he can contact RD directly with nutrition questions, ensure assistance.    Miyoko Hashimi B. Zenia Resides, [place], [place] Registered Dietitian [phone] (mobile)

## 2019-10-21 ENCOUNTER — Other Ambulatory Visit: Payer: Self-pay | Admitting: Oncology

## 2019-10-26 ENCOUNTER — Other Ambulatory Visit: Payer: Self-pay | Admitting: Oncology

## 2019-10-26 DIAGNOSIS — C7931 Secondary malignant neoplasm of brain: Secondary | ICD-10-CM

## 2019-10-26 DIAGNOSIS — C763 Malignant neoplasm of pelvis: Secondary | ICD-10-CM

## 2019-10-26 DIAGNOSIS — M79604 Pain in right leg: Secondary | ICD-10-CM

## 2019-10-26 DIAGNOSIS — M79605 Pain in left leg: Secondary | ICD-10-CM

## 2019-10-26 DIAGNOSIS — C3490 Malignant neoplasm of unspecified part of unspecified bronchus or lung: Secondary | ICD-10-CM

## 2019-10-26 MED ORDER — OXYCODONE HCL 15 MG PO TABS: 15.0000 mg | ORAL_TABLET | ORAL | 0 refills | Status: DC | PRN

## 2019-10-27 ENCOUNTER — Telehealth: Payer: Self-pay

## 2019-10-27 NOTE — Telephone Encounter (Signed)
PROGRESS NOTE: Faxed sign pt form from Dr Alen Blew to [employer] for pharmacy request for prior approval- standard drug request form to fax# [phone]. Fax came back as approved and placed in bin at nurse desk

## 2019-10-27 NOTE — Telephone Encounter (Signed)
Sending to you for refusal.  Thank you! Eric Mason

## 2019-10-29 ENCOUNTER — Telehealth: Payer: Self-pay | Admitting: Internal Medicine

## 2019-10-29 ENCOUNTER — Other Ambulatory Visit: Payer: Self-pay

## 2019-10-29 ENCOUNTER — Inpatient Hospital Stay: Payer: Medicaid Other | Admitting: Internal Medicine

## 2019-10-29 NOTE — Telephone Encounter (Signed)
R/s todays appt per patient request. Moved to 8/20

## 2019-10-30 ENCOUNTER — Other Ambulatory Visit: Payer: Self-pay

## 2019-10-30 ENCOUNTER — Inpatient Hospital Stay (HOSPITAL_BASED_OUTPATIENT_CLINIC_OR_DEPARTMENT_OTHER): Payer: Medicaid Other | Admitting: Internal Medicine

## 2019-10-30 VITALS — BP 140/62 | HR 84 | Temp 98.7°F | Resp 20 | Ht 70.0 in | Wt 161.7 lb

## 2019-10-30 DIAGNOSIS — C7800 Secondary malignant neoplasm of unspecified lung: Secondary | ICD-10-CM | POA: Diagnosis not present

## 2019-10-30 DIAGNOSIS — R519 Headache, unspecified: Secondary | ICD-10-CM | POA: Diagnosis not present

## 2019-10-30 DIAGNOSIS — C7931 Secondary malignant neoplasm of brain: Secondary | ICD-10-CM | POA: Diagnosis not present

## 2019-10-30 DIAGNOSIS — Z803 Family history of malignant neoplasm of breast: Secondary | ICD-10-CM | POA: Diagnosis not present

## 2019-10-30 DIAGNOSIS — C801 Malignant (primary) neoplasm, unspecified: Secondary | ICD-10-CM | POA: Diagnosis not present

## 2019-10-30 DIAGNOSIS — Z79899 Other long term (current) drug therapy: Secondary | ICD-10-CM | POA: Diagnosis not present

## 2019-10-30 DIAGNOSIS — F17210 Nicotine dependence, cigarettes, uncomplicated: Secondary | ICD-10-CM | POA: Diagnosis not present

## 2019-10-30 DIAGNOSIS — J449 Chronic obstructive pulmonary disease, unspecified: Secondary | ICD-10-CM | POA: Diagnosis not present
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
## 2019-11-02 ENCOUNTER — Telehealth: Payer: Self-pay | Admitting: Internal Medicine

## 2019-11-02 NOTE — Telephone Encounter (Signed)
Scheduled appointment per 8/20 los. Patient is aware of appointment date and time.

## 2019-11-05 ENCOUNTER — Other Ambulatory Visit: Payer: Self-pay | Admitting: Radiation Therapy

## 2019-11-05 ENCOUNTER — Telehealth: Payer: Self-pay | Admitting: Internal Medicine

## 2019-11-05 NOTE — Telephone Encounter (Signed)
R/s appt on 10/22 to 10/21. Provider on PAL

## 2019-11-06 ENCOUNTER — Other Ambulatory Visit: Payer: Self-pay | Admitting: Medical

## 2019-11-06 DIAGNOSIS — C763 Malignant neoplasm of pelvis: Secondary | ICD-10-CM

## 2019-11-06 DIAGNOSIS — C7931 Secondary malignant neoplasm of brain: Secondary | ICD-10-CM

## 2019-11-06 DIAGNOSIS — M79604 Pain in right leg: Secondary | ICD-10-CM

## 2019-11-06 DIAGNOSIS — C3490 Malignant neoplasm of unspecified part of unspecified bronchus or lung: Secondary | ICD-10-CM

## 2019-11-06 MED ORDER — DEXAMETHASONE 2 MG PO TABS: 1.0000 mg | ORAL_TABLET | Freq: Every day | ORAL | 0 refills | Status: DC

## 2019-11-06 NOTE — Telephone Encounter (Signed)
Refill request

## 2019-11-09 ENCOUNTER — Other Ambulatory Visit: Payer: Self-pay | Admitting: Oncology

## 2019-11-09 DIAGNOSIS — C763 Malignant neoplasm of pelvis: Secondary | ICD-10-CM

## 2019-11-09 DIAGNOSIS — C3490 Malignant neoplasm of unspecified part of unspecified bronchus or lung: Secondary | ICD-10-CM

## 2019-11-09 DIAGNOSIS — C7931 Secondary malignant neoplasm of brain: Secondary | ICD-10-CM

## 2019-11-09 DIAGNOSIS — M79604 Pain in right leg: Secondary | ICD-10-CM

## 2019-11-09 MED ORDER — OXYCODONE HCL 15 MG PO TABS: 15.0000 mg | ORAL_TABLET | ORAL | 0 refills | Status: DC | PRN

## 2019-11-12 ENCOUNTER — Ambulatory Visit: Payer: Medicaid Other | Admitting: Internal Medicine

## 2019-11-13 ENCOUNTER — Encounter: Payer: Self-pay | Admitting: Internal Medicine

## 2019-11-13 ENCOUNTER — Other Ambulatory Visit: Payer: Self-pay

## 2019-11-13 ENCOUNTER — Ambulatory Visit: Payer: Medicaid Other | Attending: Internal Medicine | Admitting: Internal Medicine

## 2019-11-13 VITALS — BP 102/65 | HR 86 | Ht 70.0 in | Wt 160.0 lb

## 2019-11-13 DIAGNOSIS — F419 Anxiety disorder, unspecified: Secondary | ICD-10-CM

## 2019-11-13 DIAGNOSIS — F17200 Nicotine dependence, unspecified, uncomplicated: Secondary | ICD-10-CM

## 2019-11-13 DIAGNOSIS — F1910 Other psychoactive substance abuse, uncomplicated: Secondary | ICD-10-CM | POA: Diagnosis not present

## 2019-11-13 DIAGNOSIS — C3490 Malignant neoplasm of unspecified part of unspecified bronchus or lung: Secondary | ICD-10-CM

## 2019-11-13 DIAGNOSIS — R569 Unspecified convulsions: Secondary | ICD-10-CM

## 2019-11-13 MED ORDER — BUSPIRONE HCL 10 MG PO TABS: 10.0000 mg | ORAL_TABLET | Freq: Two times a day (BID) | ORAL | 6 refills | Status: DC

## 2019-11-13 NOTE — Patient Instructions (Signed)
We have increased the BuSpar to 10 mg daily. I will find out from the oncologist whether it is okay for [place] to give you the flu vaccine and will let you know. Consider getting into a treatment program to try keep yourself clean of street drug use. I advised against vaping.  You should use the nicotine patches to help decrease your cravings for cigarettes.

## 2019-11-13 NOTE — Progress Notes (Signed)
Patient ID: Eric Mason, male    DOB: 08/15/1970  MRN: [id number]  CC: Hospitalization Follow-up   Subjective: Eric Mason is a 49 y.o. 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  Patient was hospitalized in June and July with encephalopathy and acute kidney injury associated with poor oral intake, diarrhea and substance use.  Urine drug screen was positive for amphetamines, cannabis, cocaine and oxycodone. Appetite better but not as it was when he was on high dose steriod  CA:  Being observed followed by Dr. Alen Blew and neuro oncology.  He tells me that recently they found 2 new lesions in the brain but the plan is to observe with follow-up imaging. Having a lot of back pain associated with lesions in his spine.  On oxycodone through his oncologist.  SZ:  No seizures.  Compliant with Keppra  Anxiety:  Still feels on edge.  Would like to try higher dose of Buspar  Tob dep: tells me he has been vaping on "liquid wax" that has THC in it.  He discontinue using after hosp in June with dehydration and ARI.  He states that his urine drug screen was positive for amphetamines which he does not use any thinks it may have been in the liquid wax. He does have nicotine patches and has started using  Patient Active Problem List   Diagnosis Date Noted  . AKI (acute kidney injury) ([place]) 09/09/2019  . Mediastinal lymphadenopathy 09/01/2019  . Goals of care, counseling/discussion   . Palliative care by specialist   . ARF (acute renal failure) ([place]) 08/17/2019  . Pulmonary emphysema ([place]) 04/28/2019  . Anxiety 04/28/2019  . Bone metastasis ([place]) 10/24/2018  . Intractable nausea and vomiting 09/24/2018  . 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 Encounter for antineoplastic chemotherapy 07/30/2017  . Encounter for antineoplastic immunotherapy 07/30/2017  . Port-A-Cath in place 06/18/2017  . Lung cancer ([place]) 06/03/2017  . Genetic testing 05/27/2017  . Family history of colon cancer 05/08/2017  . Family history of cholangiocarcinoma 05/08/2017  . Family history of breast cancer 05/08/2017  . Family history of renal cancer 05/08/2017  . Family history of thyroid cancer 05/08/2017  . Abnormal stress test 11/08/2016  . Bilateral leg pain 11/08/2016  . Gastroesophageal reflux disease 10/10/2016  . Atypical chest pain 10/10/2016  . Tobacco abuse 07/04/2016  . Brain metastasis ([place]) 07/02/2016  . Pulmonary nodule 06/19/2016  . Seizure ([place]) 06/17/2016     Current Outpatient Medications on File Prior to Visit  Medication Sig Dispense Refill  . amitriptyline (ELAVIL) 75 MG tablet Take 1 tablet (75 mg total) by mouth at bedtime. 30 tablet 3  . baclofen (LIORESAL) 10 MG tablet Take 1 tablet (10 mg total) by mouth 2 (two) times daily. 60 each 3  . bisacodyl (DULCOLAX) 5 MG EC tablet Take 2 tablets (10 mg total) by mouth daily as needed for moderate constipation. 30 tablet 0  . Cholecalciferol (VITAMIN D) 50 MCG (2000 UT) CAPS Take 2,000 Units by mouth daily.    [name] dexamethasone (DECADRON) 2 MG tablet Take 0.5 tablets (1 mg total) by mouth daily. 30 tablet 0  . DULoxetine (CYMBALTA) 30 MG capsule Take 1 capsule (30 mg total) by mouth daily. 30 capsule  5  . folic acid (FOLVITE) 1 MG tablet Take 1 tablet (1 mg total) by mouth daily. 100 tablet 1  . gabapentin (NEURONTIN) 800 MG tablet Take 1 tablet (800 mg total) by mouth 3 (three) times daily. 90 tablet 0  . levETIRAcetam (KEPPRA) 1000 MG tablet Take 2 tablets (2,000 mg total) by mouth 2 (two) times daily. 120 tablet 3  . Multiple Vitamin (MULTIVITAMIN) tablet Take 1 tablet by mouth daily. 30 tablet 0  . oxyCODONE (ROXICODONE) 15 MG immediate  release tablet Take 1 tablet (15 mg total) by mouth every 4 (four) hours as needed for pain. 126 tablet 0  . pantoprazole (PROTONIX) 40 MG tablet Take 1 tablet (40 mg total) by mouth 2 (two) times daily. 60 tablet 1  . polyethylene glycol (MIRALAX / GLYCOLAX) 17 g packet Take 17 g by mouth daily as needed for moderate constipation.    [name] PROAIR HFA 108 (90 Base) MCG/ACT inhaler Inhale 2 puffs into the lungs every 6 (six) hours as needed for wheezing or shortness of breath.(200/8=25) 8.5 g 6  . promethazine (PHENERGAN) 25 MG tablet Take 1 tablet (25 mg total) by mouth every 6 (six) hours as needed for nausea or vomiting. 30 tablet 0  . B Complex-C (B-COMPLEX WITH VITAMIN C) tablet Take 1 tablet by mouth daily. (Patient not taking: Reported on 09/09/2019)    . nicotine (NICODERM CQ - DOSED IN MG/24 HOURS) 21 mg/24hr patch Place 1 patch (21 mg total) onto the skin daily. (Patient not taking: Reported on 11/13/2019) 28 patch 0  . senna-docusate (SENOKOT-S) 8.6-50 MG tablet Take 2 tablets by mouth 2 (two) times daily. (Patient not taking: Reported on 09/09/2019) 30 tablet 1  . thiamine (VITAMIN B-1) 100 MG tablet Take 1 tablet (100 mg total) by mouth daily. (Patient not taking: Reported on 09/09/2019) 100 tablet 0   Current Facility-Administered Medications on File Prior to Visit  Medication Dose Route Frequency Provider Last Rate Last Admin  . sodium chloride flush (NS) 0.9 % injection 10 mL  10 mL Intracatheter PRN Wyatt Portela, MD   10 mL at 06/18/17 1815    Allergies  Allergen Reactions  . Latex Itching and Rash    Social History   Socioeconomic History  . Marital status: Married    Spouse name: Not on file  . Number of children: Not on file  . Years of education: Not on file  . Highest education level: Not on file  Occupational History  . Not on file  Tobacco Use  . Smoking status: Current Every Day Smoker    Packs/day: 1.00    Years: 40.00    Pack years: 40.00    Types: Cigarettes   . Smokeless tobacco: Never Used  . Tobacco comment: 8 cigarettes daily 08/21/19 [facility]   Vaping Use  . Vaping Use: Some days  Substance and Sexual Activity  . Alcohol use: Yes    Alcohol/week: 14.0 - 20.0 standard drinks    Types: 14 - 20 Cans of beer per week  . Drug use: Yes    Types: Marijuana    Comment: smoke marijuana - 08/17/19  . Sexual activity: Not on file  Other Topics Concern  . Not on file  Social History Narrative  . Not on file   Social Determinants of Health   Financial Resource Strain:   . Difficulty of Paying Living Expenses: Not on file  Food Insecurity:   . Worried About Charity fundraiser in the  Last Year: Not on file  . Ran Out of Food in the Last Year: Not on file  Transportation Needs:   . Lack of Transportation (Medical): Not on file  . Lack of Transportation (Non-Medical): Not on file  Physical Activity:   . Days of Exercise per Week: Not on file  . Minutes of Exercise per Session: Not on file  Stress:   . Feeling of Stress : Not on file  Social Connections:   . Frequency of Communication with Friends and Family: Not on file  . Frequency of Social Gatherings with Friends and Family: Not on file  . Attends Religious Services: Not on file  . Active Member of Clubs or Organizations: Not on file  . Attends Archivist Meetings: Not on file  . Marital Status: Not on file  Intimate Partner Violence:   . Fear of Current or Ex-Partner: Not on file  . Emotionally Abused: Not on file  . Physically Abused: Not on file  . Sexually Abused: Not on file    Family History  Problem Relation Age of Onset  . Hypertension Mother   . Thyroid cancer Mother 31  . Hypertension Father   . Stomach cancer Father        mets to brain  . Renal cancer Paternal Grandmother 20  . Cancer - Other Paternal Aunt 44       cholangiocarcinoma  . Breast cancer Paternal Aunt 17  . Colon cancer Paternal Uncle   . Breast cancer Maternal Aunt   . Breast cancer Maternal  Grandmother        dx >50    Past Surgical History:  Procedure Laterality Date  . APPLICATION OF CRANIAL NAVIGATION N/A 07/11/2016   Procedure: APPLICATION OF CRANIAL NAVIGATION;  Surgeon: Kevan Ny Ditty, MD;  Location: [facility];  Service: Neurosurgery;  Laterality: N/A;  . BRONCHIAL NEEDLE ASPIRATION BIOPSY  09/01/2019   Procedure: BRONCHIAL NEEDLE ASPIRATION BIOPSIES;  Surgeon: Collene Gobble, MD;  Location: [facility] ENDOSCOPY;  Service: Pulmonary;;  . BRONCHIAL WASHINGS  09/01/2019   Procedure: BRONCHIAL WASHINGS;  Surgeon: Collene Gobble, MD;  Location: [facility] ENDOSCOPY;  Service: Pulmonary;;  . CRANIOTOMY N/A 07/11/2016   Procedure: Right Temporal craniotomy with brainlab;  Surgeon: Kevan Ny Ditty, MD;  Location: [facility];  Service: Neurosurgery;  Laterality: N/A;  Right Temporal   . ESOPHAGOGASTRODUODENOSCOPY N/A 07/09/2016   Procedure: ESOPHAGOGASTRODUODENOSCOPY (EGD);  Surgeon: Jerene Bears, MD;  Location: [facility] ENDOSCOPY;  Service: Endoscopy;  Laterality: N/A;  . IR FLUORO GUIDE PORT INSERTION RIGHT  06/12/2017  . IR US GUIDE VASC ACCESS RIGHT  06/12/2017  . SHOULDER SURGERY Right   . VIDEO BRONCHOSCOPY WITH ENDOBRONCHIAL NAVIGATION N/A 06/20/2016   Procedure: VIDEO BRONCHOSCOPY WITH ENDOBRONCHIAL NAVIGATION;  Surgeon: Collene Gobble, MD;  Location: [facility];  Service: Thoracic;  Laterality: N/A;  . VIDEO BRONCHOSCOPY WITH ENDOBRONCHIAL ULTRASOUND N/A 09/01/2019   Procedure: VIDEO BRONCHOSCOPY WITH ENDOBRONCHIAL ULTRASOUND;  Surgeon: Collene Gobble, MD;  Location: [facility] ENDOSCOPY;  Service: Pulmonary;  Laterality: N/A;    ROS: Review of Systems Negative except as stated above  PHYSICAL EXAM: BP 102/65   Pulse 86   Ht 5\' 10"  (1.778 m)   Wt 160 lb (72.6 kg)   SpO2 98%   BMI 22.96 kg/m   Wt Readings from Last 3 Encounters:  11/13/19 160 lb (72.6 kg)  10/30/19 161 lb 11.2 oz (73.3 kg)  10/07/19 157 lb 11.2 oz (71.5 kg)   Physical Exam  General appearance - alert, well appearing, middle-aged  African-American male and in no distress Mental status - normal mood, behavior, speech, dress, motor activity, and thought processes Neck - supple, no significant adenopathy Chest - clear to auscultation, no wheezes, rales or rhonchi, symmetric air entry Heart - normal rate, regular rhythm, normal S1, S2, no murmurs, rubs, clicks or gallops Extremities -no lower extremity edema CMP Latest Ref Rng & Units 09/10/2019 09/09/2019 08/20/2019  Glucose 70 - 99 mg/dL 88 104(H) 90  BUN 6 - 20 mg/dL 24(H) 35(H) 8  Creatinine 0.61 - 1.24 mg/dL 0.96 1.10 0.80  Sodium 135 - 145 mmol/L 136 136 138  Potassium 3.5 - 5.1 mmol/L 4.2 4.5 3.7  Chloride 98 - 111 mmol/L 106 104 106  CO2 22 - 32 mmol/L 24 23 22   Calcium 8.9 - 10.3 mg/dL 8.5(L) 9.1 9.1  Total Protein 6.5 - 8.1 g/dL 6.4(L) 7.2 -  Total Bilirubin 0.3 - 1.2 mg/dL 0.3 0.3 -  Alkaline Phos 38 - 126 U/L 52 56 -  AST 15 - 41 U/L 15 19 -  ALT 0 - 44 U/L 20 22 -   Lipid Panel  No results found for: CHOL, TRIG, HDL, CHOLHDL, VLDL, LDLCALC, LDLDIRECT  CBC    Component Value Date/Time   WBC 8.0 09/10/2019 0425   RBC 4.09 (L) 09/10/2019 0425   HGB 10.1 (L) 09/10/2019 0425   HGB 11.4 (L) 02/03/2019 0839   HGB 11.1 (L) 10/30/2016 0908   HCT 32.7 (L) 09/10/2019 0425   HCT 33.8 (L) 10/30/2016 0908   PLT 278 09/10/2019 0425   PLT 370 02/03/2019 0839   PLT 339 10/30/2016 0908   PLT 345 09/25/2016 1520   MCV 80.0 09/10/2019 0425   MCV 91.5 10/30/2016 0908   MCH 24.7 (L) 09/10/2019 0425   MCHC 30.9 09/10/2019 0425   RDW 22.7 (H) 09/10/2019 0425   RDW 17.4 (H) 10/30/2016 0908   LYMPHSABS 5.2 (H) 09/09/2019 1658   LYMPHSABS 2.5 10/30/2016 0908   MONOABS 0.6 09/09/2019 1658   MONOABS 1.1 (H) 10/30/2016 0908   EOSABS 0.0 09/09/2019 1658   EOSABS 0.1 10/30/2016 0908   EOSABS 0.1 09/25/2016 1520   BASOSABS 0.0 09/09/2019 1658   BASOSABS 0.0 10/30/2016 0908    ASSESSMENT AND PLAN: 1. Anxiety We agreed to increase the BuSpar to 10 mg twice a day -  busPIRone (BUSPAR) 10 MG tablet; Take 1 tablet (10 mg total) by mouth 2 (two) times daily.  Dispense: 60 tablet; Refill: 6  2. Seizure ([place]) No recent seizures.  He continues on Keppra  3. Tobacco dependence Encouraged him to quit.  Patient states he is working on doing so.  He has nicotine patches at home and states he is started using them.  Advised against vaping.  Less than 5 minutes spent on counseling.  4. Polysubstance abuse ([place]) Encouraged him to stay away from street drugs.  Encouraged to get into a treatment program if he has to.  5. Primary malignant neoplasm of lung metastatic to other site, unspecified laterality [facility]) Managed by oncology   Patient was given the opportunity to ask questions.  Patient verbalized understanding of the plan and was able to repeat key elements of the plan.   No orders of the defined types were placed in this encounter.    Requested Prescriptions   Signed Prescriptions Disp Refills  . busPIRone (BUSPAR) 10 MG tablet 60 tablet 6    Sig: Take 1 tablet (10 mg total) by  mouth 2 (two) times daily.    Return in about 3 months (around 02/12/2020).  Karle Plumber, MD, FACP

## 2019-11-17 ENCOUNTER — Other Ambulatory Visit: Payer: Self-pay | Admitting: Oncology

## 2019-11-17 DIAGNOSIS — R112 Nausea with vomiting, unspecified: Secondary | ICD-10-CM

## 2019-11-19 ENCOUNTER — Other Ambulatory Visit: Payer: Self-pay | Admitting: Medical

## 2019-11-19 ENCOUNTER — Other Ambulatory Visit: Payer: Self-pay | Admitting: *Deleted

## 2019-11-19 DIAGNOSIS — G893 Neoplasm related pain (acute) (chronic): Secondary | ICD-10-CM

## 2019-11-19 MED ORDER — LEVETIRACETAM 1000 MG PO TABS: 2000.0000 mg | ORAL_TABLET | Freq: Two times a day (BID) | ORAL | 3 refills | Status: DC

## 2019-11-20 ENCOUNTER — Emergency Department (HOSPITAL_COMMUNITY): Payer: Medicaid Other

## 2019-11-20 ENCOUNTER — Other Ambulatory Visit: Payer: Self-pay

## 2019-11-20 ENCOUNTER — Emergency Department (HOSPITAL_COMMUNITY)
Admission: EM | Admit: 2019-11-20 | Discharge: 2019-11-20 | Disposition: A | Discharge status: Home / Self Care | Payer: Medicaid Other | Attending: Emergency Medicine | Admitting: Emergency Medicine

## 2019-11-20 ENCOUNTER — Encounter (HOSPITAL_COMMUNITY): Payer: Self-pay

## 2019-11-20 DIAGNOSIS — I10 Essential (primary) hypertension: Secondary | ICD-10-CM | POA: Insufficient documentation

## 2019-11-20 DIAGNOSIS — R112 Nausea with vomiting, unspecified: Secondary | ICD-10-CM | POA: Diagnosis not present

## 2019-11-20 DIAGNOSIS — R1084 Generalized abdominal pain: Secondary | ICD-10-CM | POA: Diagnosis not present

## 2019-11-20 DIAGNOSIS — J449 Chronic obstructive pulmonary disease, unspecified: Secondary | ICD-10-CM | POA: Diagnosis not present

## 2019-11-20 DIAGNOSIS — F17210 Nicotine dependence, cigarettes, uncomplicated: Secondary | ICD-10-CM | POA: Insufficient documentation

## 2019-11-20 DIAGNOSIS — R109 Unspecified abdominal pain: Secondary | ICD-10-CM | POA: Diagnosis not present

## 2019-11-20 DIAGNOSIS — Z79899 Other long term (current) drug therapy: Secondary | ICD-10-CM | POA: Diagnosis not present

## 2019-11-20 DIAGNOSIS — R197 Diarrhea, unspecified: Secondary | ICD-10-CM | POA: Diagnosis not present

## 2019-11-20 DIAGNOSIS — R1110 Vomiting, unspecified: Secondary | ICD-10-CM | POA: Diagnosis not present

## 2019-11-20 DIAGNOSIS — E876 Hypokalemia: Secondary | ICD-10-CM

## 2019-11-20 DIAGNOSIS — Z85118 Personal history of other malignant neoplasm of bronchus and lung: Secondary | ICD-10-CM | POA: Diagnosis not present

## 2019-11-20 LAB — COMPREHENSIVE METABOLIC PANEL
ALT: 18 U/L (ref 0–44)
AST: 19 U/L (ref 15–41)
Albumin: 4.1 g/dL (ref 3.5–5.0)
Alkaline Phosphatase: 56 U/L (ref 38–126)
Anion gap: 13 (ref 5–15)
BUN: 12 mg/dL (ref 6–20)
CO2: 27 mmol/L (ref 22–32)
Calcium: 9.4 mg/dL (ref 8.9–10.3)
Chloride: 104 mmol/L (ref 98–111)
Creatinine, Ser: 0.78 mg/dL (ref 0.61–1.24)
GFR calc Af Amer: >60 mL/min (ref >60)
GFR calc non Af Amer: >60 mL/min (ref >60)
Glucose, Bld: 118 mg/dL — ABNORMAL HIGH (ref 70–99)
Potassium: 3.1 mmol/L — ABNORMAL LOW (ref 3.5–5.1)
Sodium: 144 mmol/L (ref 135–145)
Total Bilirubin: 0.5 mg/dL (ref 0.3–1.2)
Total Protein: 7.9 g/dL (ref 6.5–8.1)

## 2019-11-20 LAB — CBC WITH DIFFERENTIAL/PLATELET
Abs Immature Granulocytes: 0.07 10*3/uL (ref 0.00–0.07)
Basophils Absolute: 0 10*3/uL (ref 0.0–0.1)
Basophils Relative: 0 %
Eosinophils Absolute: 0 10*3/uL (ref 0.0–0.5)
Eosinophils Relative: 0 %
HCT: 40 % (ref 39.0–52.0)
Hemoglobin: 12.5 g/dL — ABNORMAL LOW (ref 13.0–17.0)
Immature Granulocytes: 1 %
Lymphocytes Relative: 28 %
Lymphs Abs: 4.1 10*3/uL — ABNORMAL HIGH (ref 0.7–4.0)
MCH: 28.9 pg (ref 26.0–34.0)
MCHC: 31.3 g/dL (ref 30.0–36.0)
MCV: 92.4 fL (ref 80.0–100.0)
Monocytes Absolute: 1 10*3/uL (ref 0.1–1.0)
Monocytes Relative: 6 %
Neutro Abs: 9.8 10*3/uL — ABNORMAL HIGH (ref 1.7–7.7)
Neutrophils Relative %: 65 %
Platelets: 261 10*3/uL (ref 150–400)
RBC: 4.33 MIL/uL (ref 4.22–5.81)
RDW: 16 % — ABNORMAL HIGH (ref 11.5–15.5)
WBC: 15 10*3/uL — ABNORMAL HIGH (ref 4.0–10.5)
nRBC: 0 % (ref 0.0–0.2)

## 2019-11-20 LAB — LIPASE, BLOOD: Lipase: 27 U/L (ref 11–51)

## 2019-11-20 LAB — URINALYSIS, ROUTINE W REFLEX MICROSCOPIC
Bilirubin Urine: NEGATIVE
Glucose, UA: NEGATIVE mg/dL
Hgb urine dipstick: NEGATIVE
Ketones, ur: NEGATIVE mg/dL
Leukocytes,Ua: NEGATIVE
Nitrite: NEGATIVE
Protein, ur: NEGATIVE mg/dL
Specific Gravity, Urine: 1.014 (ref 1.005–1.030)
pH: 6 (ref 5.0–8.0)

## 2019-11-20 MED ORDER — ONDANSETRON HCL 4 MG/2ML IJ SOLN
4.0000 mg | Freq: Once | INTRAMUSCULAR | Status: AC
  Administered 2019-11-20: 4 mg via INTRAVENOUS
  Filled 2019-11-20: qty 2

## 2019-11-20 MED ORDER — PROMETHAZINE HCL 25 MG/ML IJ SOLN
25.0000 mg | Freq: Once | INTRAMUSCULAR | Status: AC
  Administered 2019-11-20: 25 mg via INTRAVENOUS
  Filled 2019-11-20: qty 1

## 2019-11-20 MED ORDER — HYDROMORPHONE HCL 1 MG/ML IJ SOLN
1.0000 mg | Freq: Once | INTRAMUSCULAR | Status: AC
  Administered 2019-11-20: 1 mg via INTRAVENOUS
  Filled 2019-11-20: qty 1

## 2019-11-20 MED ORDER — IOHEXOL 300 MG/ML  SOLN
100.0000 mL | Freq: Once | INTRAMUSCULAR | Status: AC | PRN
  Administered 2019-11-20: 100 mL via INTRAVENOUS

## 2019-11-20 MED ORDER — HYDROMORPHONE HCL 2 MG/ML IJ SOLN
2.0000 mg | Freq: Once | INTRAMUSCULAR | Status: AC
  Administered 2019-11-20: 2 mg via INTRAVENOUS
  Filled 2019-11-20: qty 1

## 2019-11-20 MED ORDER — POTASSIUM CHLORIDE CRYS ER 20 MEQ PO TBCR
40.0000 meq | EXTENDED_RELEASE_TABLET | Freq: Once | ORAL | Status: AC
  Administered 2019-11-20: 40 meq via ORAL
  Filled 2019-11-20: qty 2

## 2019-11-20 MED ORDER — SODIUM CHLORIDE 0.9 % IV BOLUS
1000.0000 mL | Freq: Once | INTRAVENOUS | Status: AC
  Administered 2019-11-20: 1000 mL via INTRAVENOUS

## 2019-11-20 NOTE — ED Notes (Signed)
Patient was able to tolerate PO well.

## 2019-11-20 NOTE — Discharge Instructions (Signed)
Follow up with Oncology. Take home pain medications at this time.

## 2019-11-20 NOTE — ED Notes (Signed)
Sister, Delene Ruffini, would like you to call when he needs a ride home, [phone].

## 2019-11-20 NOTE — Telephone Encounter (Signed)
Dr. Alen Blew, This went to [facility] but he is your patient.   Sending to your queue. .mec

## 2019-11-20 NOTE — ED Provider Notes (Signed)
Care assumed from [place], [place] See note for full HPI.  In summation, 49 year old with CA with Mets presents for N/V/D and back pain. Back pain chronic in nature. Hx of polysubstance use however denies IVDU.  No emesis here in ED per patient.   Plan to follow up on CT AP and reassess.  Physical Exam  BP (!) 154/92   Pulse 68   Temp 99.1 F (37.3 C) (Oral)   Resp 16   Ht 5\' 10"  (1.778 m)   Wt 72.6 kg   SpO2 100%   BMI 22.96 kg/m   Physical Exam Vitals and nursing note reviewed.  Constitutional:      General: He is not in acute distress.    Appearance: He is well-developed. He is not ill-appearing, toxic-appearing or diaphoretic.  HENT:     Head: Normocephalic and atraumatic.     Mouth/Throat:     Mouth: Mucous membranes are moist.  Eyes:     Pupils: Pupils are equal, round, and reactive to light.  Cardiovascular:     Rate and Rhythm: Normal rate and regular rhythm.     Heart sounds: Normal heart sounds.  Pulmonary:     Effort: Pulmonary effort is normal. No respiratory distress.  Abdominal:     General: Bowel sounds are normal. There is no distension.     Palpations: Abdomen is soft.     Tenderness: There is no abdominal tenderness. There is right CVA tenderness. There is no left CVA tenderness, guarding or rebound.     Hernia: No hernia is present.  Musculoskeletal:        General: Normal range of motion.     Cervical back: Normal range of motion and neck supple.     Comments: No midline spinal tenderness. Negative straight leg raise bilaterally. Moves all 4 extremities without difficulty. Able to flex, and extend at all major joints.  Skin:    General: Skin is warm and dry.     Capillary Refill: Capillary refill takes less than 2 seconds.  Neurological:     General: No focal deficit present.     Mental Status: He is alert and oriented to person, place, and time.     Sensory: Sensation is intact.     Motor: Motor function is intact.     Coordination: Coordination  is intact.     Gait: Gait is intact.     Comments: Intact sensation. Ambulatory without difficulty Equal strength bilaterally     ED Course/Procedures     Procedures Labs Reviewed  CBC WITH DIFFERENTIAL/PLATELET - Abnormal; Notable for the following components:      Result Value   WBC 15.0 (*)    Hemoglobin 12.5 (*)    RDW 16.0 (*)    Neutro Abs 9.8 (*)    Lymphs Abs 4.1 (*)    All other components within normal limits  COMPREHENSIVE METABOLIC PANEL - Abnormal; Notable for the following components:   Potassium 3.1 (*)    Glucose, Bld 118 (*)    All other components within normal limits  LIPASE, BLOOD  URINALYSIS, ROUTINE W REFLEX MICROSCOPIC  CT ABDOMEN PELVIS W CONTRAST  Result Date: 11/20/2019 CLINICAL DATA:  Abdominal pain, nausea, vomiting, diarrhea, hematemesis, history of metastatic lung cancer EXAM: CT ABDOMEN AND PELVIS WITH CONTRAST TECHNIQUE: Multidetector CT imaging of the abdomen and pelvis was performed using the standard protocol following bolus administration of intravenous contrast. CONTRAST:  156mL OMNIPAQUE IOHEXOL 300 MG/ML  SOLN COMPARISON:  08/17/2019  FINDINGS: Lower chest: Emphysematous changes are seen at the lung bases. No acute pleural or parenchymal lung disease. Hepatobiliary: No focal liver abnormality is seen. No gallstones, gallbladder wall thickening, or biliary dilatation. Pancreas: Unremarkable. No pancreatic ductal dilatation or surrounding inflammatory changes. Spleen: Normal in size without focal abnormality. Adrenals/Urinary Tract: Adrenal glands are unremarkable. Kidneys are normal, without renal calculi, focal lesion, or hydronephrosis. Bladder is unremarkable. Stomach/Bowel: No bowel obstruction or ileus. Normal appendix right lower quadrant. No wall thickening or inflammatory change. Vascular/Lymphatic: Aortic atherosclerosis. No enlarged abdominal or pelvic lymph nodes. Reproductive: Prostate is unremarkable. Other: No free fluid or free gas.  No  abdominal wall hernia. Musculoskeletal: No acute or destructive bony lesions. Reconstructed images demonstrate no additional findings. IMPRESSION: 1. No acute intra-abdominal or intrapelvic process. 2. Aortic Atherosclerosis (ICD10-I70.0) and Emphysema (ICD10-J43.9). Electronically Signed   By: Randa Ngo M.D.   On: 11/20/2019 15:49   MDM    Patient reassessed.  Has not had any emesis since he was here in the emergency department.  Does feel nauseous.  Patient states the Dilaudid had worked previously however has "worn off."  States his pain is not located to his abdomen however is more so his back where he has a known tumor.  He denies any saddle paresthesias, increased weakness to legs, paresthesias to the legs.  No urinary or bowel complaints.  Plan to give additional medication for pain control here in the emergency department.  Patient states his pain feels like his chronic pain however has had increased pain to his back over the last few months.  He was seen by Dr. Mickeal Skinner with neuro oncology who thought his pain was likely due to to his tumor which was per seen on MRI in 2020.   Patient does not meet the SIRS or Sepsis criteria.  On repeat exam patient does not have a surgical abdomin and there are no peritoneal signs.  No indication of appendicitis, bowel obstruction, bowel perforation, cholecystitis, diverticulitis, AAA, dissection.  Patient was given additional pain medication here in the emergency department as well as antiemetics.  He is tolerating p.o. intake without difficulty, has not any emesis throughout his stay in the emergency department.  He is ambulatory with out difficulty.  States his pain is at his baseline pain.  I do not feel patient needs emergent MRI for his back pain.  I have low suspicion for acute neurosurgical emergency such as cauda equina, discitis, osteomyelitis, transverse myelitis, psoas abscess, acute cord impingement, bacterial infectious process.  Stressed close  follow-up with oncology.  Patient is agreeable to this.  Patient is nontoxic, nonseptic appearing, in no apparent distress.  Patient's pain and other symptoms adequately managed in emergency department.  Fluid bolus given.  Labs, imaging and vitals reviewed.  Patient does not meet the SIRS or Sepsis criteria.  On repeat exam patient does not have a surgical abdomin and there are no peritoneal signs.  No indication of appendicitis, bowel obstruction, bowel perforation, cholecystitis, diverticulitis.  The patient has been appropriately medically screened and/or stabilized in the ED. I have low suspicion for any other emergent medical condition which would require further screening, evaluation or treatment in the ED or require inpatient management.  Patient is hemodynamically stable and in no acute distress.  Patient able to ambulate in department prior to ED.  Evaluation does not show acute pathology that would require ongoing or additional emergent interventions while in the emergency department or further inpatient treatment.  I have discussed the  diagnosis with the patient and answered all questions.  Pain is been managed while in the emergency department and patient has no further complaints prior to discharge.  Patient is comfortable with plan discussed in room and is stable for discharge at this time.  I have discussed strict return precautions for returning to the emergency department.  Patient was encouraged to follow-up with PCP/specialist refer to at discharge.     Hawthorne Day A, PA-C 11/20/19 1913    Dorie Rank, MD 11/21/19 1659
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
## 2019-11-20 NOTE — ED Triage Notes (Signed)
Patient from home complaining of abdominal pain, n/v. Pt reports blood found in vomit and stool this morning. History of kidney failure, cancer. 12.5 mg phenergan and 60 cc NS given by EMS. BP 173/105, HR 61, NSR, 96 % RA, CBG 154, Pain 7/10. Lower central abdominal tenderness.

## 2019-11-24 ENCOUNTER — Other Ambulatory Visit: Payer: Self-pay | Admitting: Oncology

## 2019-11-24 DIAGNOSIS — C763 Malignant neoplasm of pelvis: Secondary | ICD-10-CM

## 2019-11-24 DIAGNOSIS — M79605 Pain in left leg: Secondary | ICD-10-CM

## 2019-11-24 DIAGNOSIS — C7931 Secondary malignant neoplasm of brain: Secondary | ICD-10-CM

## 2019-11-24 DIAGNOSIS — C3490 Malignant neoplasm of unspecified part of unspecified bronchus or lung: Secondary | ICD-10-CM

## 2019-11-24 MED ORDER — OXYCODONE HCL 15 MG PO TABS: 15.0000 mg | ORAL_TABLET | ORAL | 0 refills | Status: DC | PRN

## 2019-12-08 ENCOUNTER — Other Ambulatory Visit: Payer: Self-pay | Admitting: Oncology

## 2019-12-09 ENCOUNTER — Other Ambulatory Visit: Payer: Self-pay

## 2019-12-09 ENCOUNTER — Inpatient Hospital Stay: Payer: Medicaid Other | Attending: Internal Medicine

## 2019-12-09 DIAGNOSIS — C7931 Secondary malignant neoplasm of brain: Secondary | ICD-10-CM | POA: Diagnosis not present

## 2019-12-09 DIAGNOSIS — Z452 Encounter for adjustment and management of vascular access device: Secondary | ICD-10-CM | POA: Diagnosis not present

## 2019-12-09 DIAGNOSIS — C3490 Malignant neoplasm of unspecified part of unspecified bronchus or lung: Secondary | ICD-10-CM | POA: Diagnosis not present

## 2019-12-09 DIAGNOSIS — Z95828 Presence of other vascular implants and grafts: Secondary | ICD-10-CM

## 2019-12-09 MED ORDER — SODIUM CHLORIDE 0.9% FLUSH
10.0000 mL | Freq: Once | INTRAVENOUS | Status: AC
  Administered 2019-12-09: 10 mL
  Filled 2019-12-09: qty 10

## 2019-12-09 MED ORDER — HEPARIN SOD (PORK) LOCK FLUSH 100 UNIT/ML IV SOLN
500.0000 [IU] | Freq: Once | INTRAVENOUS | Status: AC
  Administered 2019-12-09: 500 [IU]
  Filled 2019-12-09: qty 5

## 2019-12-15 ENCOUNTER — Other Ambulatory Visit: Payer: Self-pay | Admitting: Oncology

## 2019-12-15 DIAGNOSIS — C3490 Malignant neoplasm of unspecified part of unspecified bronchus or lung: Secondary | ICD-10-CM

## 2019-12-15 DIAGNOSIS — M79604 Pain in right leg: Secondary | ICD-10-CM

## 2019-12-15 DIAGNOSIS — C7931 Secondary malignant neoplasm of brain: Secondary | ICD-10-CM

## 2019-12-15 DIAGNOSIS — C763 Malignant neoplasm of pelvis: Secondary | ICD-10-CM

## 2019-12-15 MED ORDER — OXYCODONE HCL 15 MG PO TABS: 15.0000 mg | ORAL_TABLET | ORAL | 0 refills | Status: DC | PRN

## 2019-12-25 ENCOUNTER — Inpatient Hospital Stay
Admission: RE | Admit: 2019-12-25 | Discharge: 2019-12-25 | Disposition: A | Discharge status: Home / Self Care | Payer: Medicaid Other | Source: Ambulatory Visit | Attending: Internal Medicine | Admitting: Internal Medicine

## 2019-12-28 ENCOUNTER — Inpatient Hospital Stay: Payer: Medicaid Other

## 2019-12-30 ENCOUNTER — Ambulatory Visit
Admission: RE | Admit: 2019-12-30 | Discharge: 2019-12-30 | Disposition: A | Discharge status: Home / Self Care | Payer: Medicaid Other | Source: Ambulatory Visit | Attending: Internal Medicine | Admitting: Internal Medicine

## 2019-12-30 ENCOUNTER — Other Ambulatory Visit: Payer: Self-pay

## 2019-12-30 DIAGNOSIS — C7931 Secondary malignant neoplasm of brain: Secondary | ICD-10-CM

## 2019-12-30 DIAGNOSIS — C3490 Malignant neoplasm of unspecified part of unspecified bronchus or lung: Secondary | ICD-10-CM | POA: Diagnosis not present

## 2019-12-30 DIAGNOSIS — R9082 White matter disease, unspecified: Secondary | ICD-10-CM | POA: Diagnosis not present

## 2019-12-30 DIAGNOSIS — J3489 Other specified disorders of nose and nasal sinuses: Secondary | ICD-10-CM | POA: Diagnosis not present

## 2019-12-30 DIAGNOSIS — G939 Disorder of brain, unspecified: Secondary | ICD-10-CM | POA: Diagnosis not present

## 2019-12-30 MED ORDER — GADOBENATE DIMEGLUMINE 529 MG/ML IV SOLN
18.0000 mL | Freq: Once | INTRAVENOUS | Status: AC | PRN
  Administered 2019-12-30: 18 mL via INTRAVENOUS

## 2019-12-30 MED ORDER — SODIUM CHLORIDE 0.9% FLUSH: 10.0000 mL | INTRAVENOUS | Status: DC | PRN

## 2019-12-30 MED ORDER — HEPARIN SOD (PORK) LOCK FLUSH 100 UNIT/ML IV SOLN
500.0000 [IU] | Freq: Once | INTRAVENOUS | Status: AC
  Administered 2019-12-30: 500 [IU] via INTRAVENOUS

## 2019-12-31 ENCOUNTER — Other Ambulatory Visit: Payer: Self-pay

## 2019-12-31 ENCOUNTER — Inpatient Hospital Stay: Payer: Medicaid Other | Attending: Internal Medicine | Admitting: Internal Medicine

## 2019-12-31 VITALS — BP 117/84 | HR 86 | Temp 97.7°F | Resp 16 | Ht 70.0 in | Wt 157.8 lb

## 2019-12-31 DIAGNOSIS — Z800 Family history of malignant neoplasm of digestive organs: Secondary | ICD-10-CM | POA: Diagnosis not present

## 2019-12-31 DIAGNOSIS — C3490 Malignant neoplasm of unspecified part of unspecified bronchus or lung: Secondary | ICD-10-CM | POA: Insufficient documentation

## 2019-12-31 DIAGNOSIS — Z803 Family history of malignant neoplasm of breast: Secondary | ICD-10-CM | POA: Diagnosis not present

## 2019-12-31 DIAGNOSIS — F419 Anxiety disorder, unspecified: Secondary | ICD-10-CM | POA: Insufficient documentation

## 2019-12-31 DIAGNOSIS — R569 Unspecified convulsions: Secondary | ICD-10-CM | POA: Diagnosis not present

## 2019-12-31 DIAGNOSIS — Z8051 Family history of malignant neoplasm of kidney: Secondary | ICD-10-CM | POA: Insufficient documentation

## 2019-12-31 DIAGNOSIS — I10 Essential (primary) hypertension: Secondary | ICD-10-CM | POA: Insufficient documentation

## 2019-12-31 DIAGNOSIS — C7931 Secondary malignant neoplasm of brain: Secondary | ICD-10-CM

## 2019-12-31 DIAGNOSIS — Z8249 Family history of ischemic heart disease and other diseases of the circulatory system: Secondary | ICD-10-CM | POA: Insufficient documentation

## 2019-12-31 DIAGNOSIS — Z79899 Other long term (current) drug therapy: Secondary | ICD-10-CM | POA: Insufficient documentation

## 2019-12-31 DIAGNOSIS — F17210 Nicotine dependence, cigarettes, uncomplicated: Secondary | ICD-10-CM | POA: Insufficient documentation

## 2019-12-31 DIAGNOSIS — J449 Chronic obstructive pulmonary disease, unspecified: Secondary | ICD-10-CM | POA: Insufficient documentation

## 2019-12-31 MED ORDER — DEXAMETHASONE 1 MG PO TABS
1.0000 mg | ORAL_TABLET | Freq: Every day | ORAL | 4 refills | Status: DC
Start: 2019-12-31 — End: 2020-10-03

## 2019-12-31 MED ORDER — GABAPENTIN 800 MG PO TABS
800.0000 mg | ORAL_TABLET | Freq: Three times a day (TID) | ORAL | 3 refills | Status: DC
Start: 2019-12-31 — End: 2020-06-07
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
## 2020-01-01 ENCOUNTER — Telehealth: Payer: Self-pay | Admitting: *Deleted

## 2020-01-01 ENCOUNTER — Ambulatory Visit: Payer: Medicaid Other | Admitting: Internal Medicine

## 2020-01-01 ENCOUNTER — Other Ambulatory Visit: Payer: Self-pay | Admitting: Oncology

## 2020-01-01 DIAGNOSIS — C7931 Secondary malignant neoplasm of brain: Secondary | ICD-10-CM

## 2020-01-01 DIAGNOSIS — C763 Malignant neoplasm of pelvis: Secondary | ICD-10-CM

## 2020-01-01 DIAGNOSIS — C3490 Malignant neoplasm of unspecified part of unspecified bronchus or lung: Secondary | ICD-10-CM

## 2020-01-01 DIAGNOSIS — M79604 Pain in right leg: Secondary | ICD-10-CM

## 2020-01-01 MED ORDER — OXYCODONE HCL 15 MG PO TABS: 15.0000 mg | ORAL_TABLET | ORAL | 0 refills | Status: DC | PRN

## 2020-01-01 NOTE — Telephone Encounter (Signed)
Patient called requesting refill of Oxycodone 15 mg to be filled to [facility].  Last Rx supply valid through 01/05/2020.    Please advise.

## 2020-01-04 ENCOUNTER — Inpatient Hospital Stay: Payer: Medicaid Other

## 2020-01-14 ENCOUNTER — Other Ambulatory Visit: Payer: Self-pay | Admitting: Oncology

## 2020-01-14 DIAGNOSIS — G893 Neoplasm related pain (acute) (chronic): Secondary | ICD-10-CM

## 2020-01-18 ENCOUNTER — Other Ambulatory Visit: Payer: Self-pay | Admitting: Oncology

## 2020-01-18 DIAGNOSIS — C7931 Secondary malignant neoplasm of brain: Secondary | ICD-10-CM

## 2020-01-18 DIAGNOSIS — M79605 Pain in left leg: Secondary | ICD-10-CM

## 2020-01-18 DIAGNOSIS — C763 Malignant neoplasm of pelvis: Secondary | ICD-10-CM

## 2020-01-18 DIAGNOSIS — M79604 Pain in right leg: Secondary | ICD-10-CM

## 2020-01-18 DIAGNOSIS — C3490 Malignant neoplasm of unspecified part of unspecified bronchus or lung: Secondary | ICD-10-CM

## 2020-01-18 MED ORDER — OXYCODONE HCL 15 MG PO TABS: 15.0000 mg | ORAL_TABLET | ORAL | 0 refills | Status: DC | PRN

## 2020-02-01 ENCOUNTER — Telehealth: Payer: Self-pay | Admitting: *Deleted

## 2020-02-01 NOTE — Telephone Encounter (Signed)
Patient called to request pain medication, advised that he is supposed to have 1 week remaining of his last Rx.   Instructed him to call back closer to refill, he stated understanding.

## 2020-02-03 ENCOUNTER — Other Ambulatory Visit: Payer: Self-pay

## 2020-02-03 ENCOUNTER — Inpatient Hospital Stay: Payer: Medicaid Other

## 2020-02-03 ENCOUNTER — Inpatient Hospital Stay (HOSPITAL_BASED_OUTPATIENT_CLINIC_OR_DEPARTMENT_OTHER): Payer: Medicaid Other | Admitting: Oncology

## 2020-02-03 ENCOUNTER — Other Ambulatory Visit: Payer: Medicaid Other

## 2020-02-03 ENCOUNTER — Inpatient Hospital Stay: Payer: Medicaid Other | Attending: Internal Medicine

## 2020-02-03 DIAGNOSIS — M79605 Pain in left leg: Secondary | ICD-10-CM | POA: Diagnosis not present

## 2020-02-03 DIAGNOSIS — C763 Malignant neoplasm of pelvis: Secondary | ICD-10-CM | POA: Diagnosis not present

## 2020-02-03 DIAGNOSIS — G893 Neoplasm related pain (acute) (chronic): Secondary | ICD-10-CM | POA: Diagnosis not present

## 2020-02-03 DIAGNOSIS — C7931 Secondary malignant neoplasm of brain: Secondary | ICD-10-CM | POA: Diagnosis not present

## 2020-02-03 DIAGNOSIS — C3490 Malignant neoplasm of unspecified part of unspecified bronchus or lung: Secondary | ICD-10-CM | POA: Diagnosis not present

## 2020-02-03 DIAGNOSIS — C7951 Secondary malignant neoplasm of bone: Secondary | ICD-10-CM | POA: Diagnosis not present

## 2020-02-03 DIAGNOSIS — M79604 Pain in right leg: Secondary | ICD-10-CM | POA: Diagnosis not present

## 2020-02-03 DIAGNOSIS — Z95828 Presence of other vascular implants and grafts: Secondary | ICD-10-CM

## 2020-02-03 LAB — CBC WITH DIFFERENTIAL (CANCER CENTER ONLY)
Abs Immature Granulocytes: 0.03 10*3/uL (ref 0.00–0.07)
Basophils Absolute: 0 10*3/uL (ref 0.0–0.1)
Basophils Relative: 0 %
Eosinophils Absolute: 0.1 10*3/uL (ref 0.0–0.5)
Eosinophils Relative: 1 %
HCT: 36 % — ABNORMAL LOW (ref 39.0–52.0)
Hemoglobin: 12 g/dL — ABNORMAL LOW (ref 13.0–17.0)
Immature Granulocytes: 0 %
Lymphocytes Relative: 38 %
Lymphs Abs: 3.2 10*3/uL (ref 0.7–4.0)
MCH: 28.7 pg (ref 26.0–34.0)
MCHC: 33.3 g/dL (ref 30.0–36.0)
MCV: 86.1 fL (ref 80.0–100.0)
Monocytes Absolute: 0.6 10*3/uL (ref 0.1–1.0)
Monocytes Relative: 8 %
Neutro Abs: 4.5 10*3/uL (ref 1.7–7.7)
Neutrophils Relative %: 53 %
Platelet Count: 206 10*3/uL (ref 150–400)
RBC: 4.18 MIL/uL — ABNORMAL LOW (ref 4.22–5.81)
RDW: 14.1 % (ref 11.5–15.5)
WBC Count: 8.4 10*3/uL (ref 4.0–10.5)
nRBC: 0 % (ref 0.0–0.2)

## 2020-02-03 LAB — CMP (CANCER CENTER ONLY)
ALT: 13 U/L (ref 0–44)
AST: 16 U/L (ref 15–41)
Albumin: 3.4 g/dL — ABNORMAL LOW (ref 3.5–5.0)
Alkaline Phosphatase: 55 U/L (ref 38–126)
Anion gap: 10 (ref 5–15)
BUN: 13 mg/dL (ref 6–20)
CO2: 24 mmol/L (ref 22–32)
Calcium: 8.7 mg/dL — ABNORMAL LOW (ref 8.9–10.3)
Chloride: 104 mmol/L (ref 98–111)
Creatinine: 0.84 mg/dL (ref 0.61–1.24)
GFR, Estimated: >60 mL/min (ref >60)
Glucose, Bld: 104 mg/dL — ABNORMAL HIGH (ref 70–99)
Potassium: 3.5 mmol/L (ref 3.5–5.1)
Sodium: 138 mmol/L (ref 135–145)
Total Bilirubin: 0.4 mg/dL (ref 0.3–1.2)
Total Protein: 7.1 g/dL (ref 6.5–8.1)

## 2020-02-03 MED ORDER — OXYCODONE HCL 15 MG PO TABS: 15.0000 mg | ORAL_TABLET | ORAL | 0 refills | Status: DC | PRN

## 2020-02-03 MED ORDER — SODIUM CHLORIDE 0.9% FLUSH
10.0000 mL | Freq: Once | INTRAVENOUS | Status: AC
  Administered 2020-02-03: 10 mL
  Filled 2020-02-03: qty 10

## 2020-02-03 MED ORDER — HEPARIN SOD (PORK) LOCK FLUSH 100 UNIT/ML IV SOLN
500.0000 [IU] | Freq: Once | INTRAVENOUS | Status: AC
  Administered 2020-02-03: 500 [IU]
  Filled 2020-02-03: qty 5

## 2020-02-03 NOTE — Progress Notes (Signed)
Hematology and Oncology Follow Up   Eric Mason [id number] [birth year] 49 y.o. 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     Prior Therapy:Status post craniotomy and resection in May 2018. This was followed by [facility] completed in June 2018.  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  Carboplatin and paclitaxel with Pembrolizumab cycle 1 given on 06/18/2017.  He completed 4 cycles of carboplatin and paclitaxel and completed 6 cycles of Pembrolizumab total.  He is status post repeat radiation to L5 presumed tumor ablated in June 2020.  Current therapy: Active surveillance.   Interim History: Eric Mason returns today for repeat evaluation.  Since last visit,     Medications: Unchanged on review. Current Outpatient Medications  Medication Sig Dispense Refill  . amitriptyline (ELAVIL) 75 MG tablet Take 1 tablet (75 mg total) by mouth at bedtime. 30 tablet 3  . B Complex-C (B-COMPLEX WITH VITAMIN C) tablet Take 1 tablet by mouth daily.     . baclofen (LIORESAL) 10 MG tablet Take 1 tablet (10 mg total) by mouth 2 (two) times daily. 60 each 3  . bisacodyl (DULCOLAX) 5 MG EC tablet Take 2 tablets (10 mg total) by mouth daily as needed for moderate constipation. 30 tablet 0  . busPIRone (BUSPAR) 10 MG tablet Take 1 tablet (10 mg total) by mouth 2 (two) times daily. 60 tablet 6  . Cholecalciferol (VITAMIN D) 50 MCG (2000 UT) CAPS Take 2,000 Units by mouth daily.    [name] dexamethasone (DECADRON) 1 MG tablet Take 1 tablet (1 mg total) by mouth daily. 30 tablet 4  . DULoxetine (CYMBALTA) 30 MG capsule Take 1 capsule (30 mg total) by mouth daily. 30 capsule 5  . folic acid (FOLVITE) 1 MG  tablet Take 1 tablet (1 mg total) by mouth daily. 100 tablet 1  . gabapentin (NEURONTIN) 800 MG tablet Take 1 tablet (800 mg total) by mouth 3 (three) times daily. 90 tablet 3  . levETIRAcetam (KEPPRA) 1000 MG tablet Take 2 tablets (2,000 mg total) by mouth 2 (two) times daily. 120 tablet 3  . Multiple Vitamin (MULTIVITAMIN) tablet Take 1 tablet by mouth daily. 30 tablet 0  . nicotine (NICODERM CQ - DOSED IN MG/24 HOURS) 21 mg/24hr patch Place 1 patch (21 mg total) onto the skin daily. 28 patch 0  . oxyCODONE (ROXICODONE) 15 MG immediate release tablet Take 1 tablet (15 mg total) by mouth every 4 (four) hours as needed for pain. 126 tablet 0  . pantoprazole (PROTONIX) 40 MG tablet Take 1 tablet(s) by mouth TWO TIMES DAILY (Patient taking differently: Take 40 mg by mouth 2 (two) times daily. ) 60 tablet 5  . polyethylene glycol (MIRALAX / GLYCOLAX) 17 g packet Take 17 g by mouth daily as needed for moderate constipation.    [name] PROAIR HFA 108 (90 Base) MCG/ACT inhaler Inhale 2 puffs into the lungs every 6 (six) hours as needed for wheezing or shortness of breath.(200/8=25) (Patient taking differently: Inhale 2 puffs into the lungs every 6 (six) hours as needed for wheezing or shortness of breath. ) 8.5 g 6  . promethazine (PHENERGAN) 25 MG tablet Take 1 tablet (25 mg total) by mouth every 6 (six) hours as needed for nausea  or vomiting. 30 tablet 0  . senna-docusate (SENOKOT-S) 8.6-50 MG tablet Take 2 tablets by mouth 2 (two) times daily. 30 tablet 1  . thiamine (VITAMIN B-1) 100 MG tablet Take 1 tablet (100 mg total) by mouth daily. 100 tablet 0   No current facility-administered medications for this visit.   Facility-Administered Medications Ordered in Other Visits  Medication Dose Route Frequency Provider Last Rate Last Admin  . sodium chloride flush (NS) 0.9 % injection 10 mL  10 mL Intracatheter PRN Wyatt Portela, MD   10 mL at 06/18/17 1815     Allergies:  Allergies  Allergen Reactions  .  Latex Itching and Rash     Physical examination:     ECOG 1   General appearance: Alert, awake without any distress. Head: Atraumatic without abnormalities Oropharynx: Without any thrush or ulcers. Eyes: No scleral icterus. Lymph nodes: No lymphadenopathy noted in the cervical, supraclavicular, or axillary nodes Heart:regular rate and rhythm, without any murmurs or gallops.   Lung: Clear to auscultation without any rhonchi, wheezes or dullness to percussion. Abdomin: Soft, nontender without any shifting dullness or ascites. Musculoskeletal: No clubbing or cyanosis. Neurological: No motor or sensory deficits. Skin: No rashes or lesions.          Lab Results: Lab Results  Component Value Date   WBC 15.0 (H) 11/20/2019   HGB 12.5 (L) 11/20/2019   HCT 40.0 11/20/2019   MCV 92.4 11/20/2019   PLT 261 11/20/2019     Chemistry      Component Value Date/Time   NA 144 11/20/2019 1214   NA 137 10/30/2016 0909   K 3.1 (L) 11/20/2019 1214   K 3.3 (L) 10/30/2016 0909   CL 104 11/20/2019 1214   CO2 27 11/20/2019 1214   CO2 27 10/30/2016 0909   BUN 12 11/20/2019 1214   BUN 4.8 (L) 10/30/2016 0909   CREATININE 0.78 11/20/2019 1214   CREATININE 0.83 02/03/2019 0839   CREATININE 0.9 10/30/2016 0909      Component Value Date/Time   CALCIUM 9.4 11/20/2019 1214   CALCIUM 9.3 10/30/2016 0909   ALKPHOS 56 11/20/2019 1214   ALKPHOS 93 10/30/2016 0909   AST 19 11/20/2019 1214   AST 14 (L) 02/03/2019 0839   AST 35 (H) 10/30/2016 0909   ALT 18 11/20/2019 1214   ALT 11 02/03/2019 0839   ALT 25 10/30/2016 0909   BILITOT 0.5 11/20/2019 1214   BILITOT 0.4 02/03/2019 0839   BILITOT 0.41 10/30/2016 0909     I     Impression and Plan:  49 year old man with:  1.    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  He is currently on active surveillance without any documented progression.  It is recent  bronchoscopy and biopsy did not confirm disease progression.  I recommended continued active surveillance and reinstitute salvage chemotherapy if he has documented progression of disease.  Systemic chemotherapy would be his best option.  2.  CNS and spinal metastasis: His disease is stable and continues to follow with Dr. Mickeal Skinner from neuro-oncology.  MRI in October 2021 showed no major changes.  3.  Acute renal failure: Resolved with kidney function back to normal and September 2021.  4.  Pain: Related to his metastatic disease to the bone and CNS.  He is currently on oxycodone is managing his pain.   5. Follow-up:  Will be in 3 months to update his imaging studies.  30  minutes were spent on this visit.  The time was dedicated to reviewing his disease status, reviewing imaging studies and future plan of care with you.    Zola Button, MD 02/03/2020 10:08 AM

## 2020-02-09 ENCOUNTER — Other Ambulatory Visit: Payer: Self-pay | Admitting: *Deleted

## 2020-02-09 MED ORDER — BACLOFEN 10 MG PO TABS
10.0000 mg | ORAL_TABLET | Freq: Two times a day (BID) | ORAL | 3 refills | Status: DC
Start: 2020-02-09 — End: 2020-07-04

## 2020-02-22 ENCOUNTER — Other Ambulatory Visit: Payer: Self-pay | Admitting: Oncology

## 2020-02-22 ENCOUNTER — Telehealth: Payer: Self-pay | Admitting: *Deleted

## 2020-02-22 DIAGNOSIS — C7931 Secondary malignant neoplasm of brain: Secondary | ICD-10-CM

## 2020-02-22 DIAGNOSIS — C3490 Malignant neoplasm of unspecified part of unspecified bronchus or lung: Secondary | ICD-10-CM

## 2020-02-22 DIAGNOSIS — M79604 Pain in right leg: Secondary | ICD-10-CM

## 2020-02-22 DIAGNOSIS — C763 Malignant neoplasm of pelvis: Secondary | ICD-10-CM

## 2020-02-22 MED ORDER — OXYCODONE HCL 15 MG PO TABS: 15.0000 mg | ORAL_TABLET | ORAL | 0 refills | Status: DC | PRN

## 2020-02-22 NOTE — Telephone Encounter (Signed)
Patient called requesting refill of Oxycodone.  Last refill valid thru 02/24/2020.  Routed to Dr. Alen Blew to review.

## 2020-03-07 ENCOUNTER — Telehealth: Payer: Self-pay | Admitting: *Deleted

## 2020-03-07 ENCOUNTER — Other Ambulatory Visit: Payer: Self-pay | Admitting: Oncology

## 2020-03-07 DIAGNOSIS — M79605 Pain in left leg: Secondary | ICD-10-CM

## 2020-03-07 DIAGNOSIS — C763 Malignant neoplasm of pelvis: Secondary | ICD-10-CM

## 2020-03-07 DIAGNOSIS — C3490 Malignant neoplasm of unspecified part of unspecified bronchus or lung: Secondary | ICD-10-CM

## 2020-03-07 DIAGNOSIS — C7931 Secondary malignant neoplasm of brain: Secondary | ICD-10-CM

## 2020-03-07 MED ORDER — OXYCODONE HCL 15 MG PO TABS: 15.0000 mg | ORAL_TABLET | ORAL | 0 refills | Status: DC | PRN

## 2020-03-07 NOTE — Telephone Encounter (Signed)
Eric Mason left a message requesting a refill of his pain medication

## 2020-03-30 ENCOUNTER — Other Ambulatory Visit: Payer: Self-pay | Admitting: Oncology

## 2020-03-30 DIAGNOSIS — M79604 Pain in right leg: Secondary | ICD-10-CM

## 2020-03-30 DIAGNOSIS — C7931 Secondary malignant neoplasm of brain: Secondary | ICD-10-CM

## 2020-03-30 DIAGNOSIS — C3490 Malignant neoplasm of unspecified part of unspecified bronchus or lung: Secondary | ICD-10-CM

## 2020-03-30 DIAGNOSIS — C763 Malignant neoplasm of pelvis: Secondary | ICD-10-CM

## 2020-03-30 DIAGNOSIS — M79605 Pain in left leg: Secondary | ICD-10-CM

## 2020-03-30 MED ORDER — OXYCODONE HCL 15 MG PO TABS: 15.0000 mg | ORAL_TABLET | ORAL | 0 refills | Status: DC | PRN

## 2020-04-07 ENCOUNTER — Other Ambulatory Visit: Payer: Self-pay | Admitting: Radiation Therapy

## 2020-04-08 ENCOUNTER — Other Ambulatory Visit: Payer: Self-pay | Admitting: Radiation Therapy

## 2020-04-08 ENCOUNTER — Telehealth: Payer: Self-pay | Admitting: Radiation Therapy

## 2020-04-08 NOTE — Telephone Encounter (Signed)
Spoke with Eric Mason about his upcoming brain MRI and follow-up visit with Dr. Mickeal Skinner.   Mont Dutton R.T.(R)(T) Radiation Special Procedures Navigator

## 2020-04-18 ENCOUNTER — Other Ambulatory Visit: Payer: Self-pay | Admitting: Oncology

## 2020-04-18 DIAGNOSIS — C3490 Malignant neoplasm of unspecified part of unspecified bronchus or lung: Secondary | ICD-10-CM

## 2020-04-18 DIAGNOSIS — C763 Malignant neoplasm of pelvis: Secondary | ICD-10-CM

## 2020-04-18 DIAGNOSIS — M79604 Pain in right leg: Secondary | ICD-10-CM

## 2020-04-18 DIAGNOSIS — C7931 Secondary malignant neoplasm of brain: Secondary | ICD-10-CM

## 2020-04-18 DIAGNOSIS — M79605 Pain in left leg: Secondary | ICD-10-CM

## 2020-04-18 MED ORDER — OXYCODONE HCL 15 MG PO TABS: 15.0000 mg | ORAL_TABLET | ORAL | 0 refills | Status: DC | PRN

## 2020-04-26 ENCOUNTER — Other Ambulatory Visit: Payer: Self-pay | Admitting: Oncology

## 2020-04-26 DIAGNOSIS — R112 Nausea with vomiting, unspecified: Secondary | ICD-10-CM

## 2020-04-29 ENCOUNTER — Inpatient Hospital Stay: Admission: RE | Admit: 2020-04-29 | Payer: Medicaid Other | Source: Ambulatory Visit

## 2020-05-02 ENCOUNTER — Inpatient Hospital Stay: Payer: Medicaid Other

## 2020-05-04 ENCOUNTER — Other Ambulatory Visit: Payer: Self-pay | Admitting: Oncology

## 2020-05-04 ENCOUNTER — Ambulatory Visit
Admission: RE | Admit: 2020-05-04 | Discharge: 2020-05-04 | Disposition: A | Discharge status: Home / Self Care | Payer: Medicaid Other | Source: Ambulatory Visit | Attending: Internal Medicine | Admitting: Internal Medicine

## 2020-05-04 DIAGNOSIS — Z85118 Personal history of other malignant neoplasm of bronchus and lung: Secondary | ICD-10-CM | POA: Diagnosis not present

## 2020-05-04 DIAGNOSIS — C7931 Secondary malignant neoplasm of brain: Secondary | ICD-10-CM | POA: Diagnosis not present

## 2020-05-04 DIAGNOSIS — M79604 Pain in right leg: Secondary | ICD-10-CM

## 2020-05-04 DIAGNOSIS — G936 Cerebral edema: Secondary | ICD-10-CM | POA: Diagnosis not present

## 2020-05-04 DIAGNOSIS — G9389 Other specified disorders of brain: Secondary | ICD-10-CM | POA: Diagnosis not present

## 2020-05-04 DIAGNOSIS — C3490 Malignant neoplasm of unspecified part of unspecified bronchus or lung: Secondary | ICD-10-CM

## 2020-05-04 DIAGNOSIS — C763 Malignant neoplasm of pelvis: Secondary | ICD-10-CM

## 2020-05-04 MED ORDER — GADOBENATE DIMEGLUMINE 529 MG/ML IV SOLN
14.0000 mL | Freq: Once | INTRAVENOUS | Status: AC | PRN
  Administered 2020-05-04: 14 mL via INTRAVENOUS

## 2020-05-04 MED ORDER — OXYCODONE HCL 15 MG PO TABS: 15.0000 mg | ORAL_TABLET | ORAL | 0 refills | Status: DC | PRN

## 2020-05-05 ENCOUNTER — Inpatient Hospital Stay: Payer: Medicaid Other

## 2020-05-05 ENCOUNTER — Inpatient Hospital Stay: Payer: Medicaid Other | Attending: Internal Medicine | Admitting: Internal Medicine

## 2020-05-09 ENCOUNTER — Inpatient Hospital Stay: Payer: Medicaid Other

## 2020-05-11 ENCOUNTER — Ambulatory Visit (HOSPITAL_COMMUNITY): Payer: Medicaid Other

## 2020-05-12 ENCOUNTER — Other Ambulatory Visit: Payer: Self-pay | Admitting: Radiation Therapy

## 2020-05-12 ENCOUNTER — Inpatient Hospital Stay: Payer: Medicaid Other | Admitting: Oncology

## 2020-05-16 ENCOUNTER — Other Ambulatory Visit: Payer: Self-pay

## 2020-05-16 ENCOUNTER — Inpatient Hospital Stay: Payer: Medicaid Other | Attending: Internal Medicine | Admitting: Internal Medicine

## 2020-05-16 VITALS — BP 122/76 | HR 88 | Temp 98.2°F | Resp 18 | Ht 70.0 in | Wt 176.4 lb

## 2020-05-16 DIAGNOSIS — Z923 Personal history of irradiation: Secondary | ICD-10-CM | POA: Insufficient documentation

## 2020-05-16 DIAGNOSIS — Z7952 Long term (current) use of systemic steroids: Secondary | ICD-10-CM | POA: Insufficient documentation

## 2020-05-16 DIAGNOSIS — C7931 Secondary malignant neoplasm of brain: Secondary | ICD-10-CM | POA: Diagnosis not present

## 2020-05-16 DIAGNOSIS — G893 Neoplasm related pain (acute) (chronic): Secondary | ICD-10-CM | POA: Insufficient documentation

## 2020-05-16 DIAGNOSIS — Z79899 Other long term (current) drug therapy: Secondary | ICD-10-CM | POA: Diagnosis not present

## 2020-05-16 DIAGNOSIS — C801 Malignant (primary) neoplasm, unspecified: Secondary | ICD-10-CM | POA: Insufficient documentation

## 2020-05-16 DIAGNOSIS — F17210 Nicotine dependence, cigarettes, uncomplicated: Secondary | ICD-10-CM | POA: Insufficient documentation

## 2020-05-16 DIAGNOSIS — Z9221 Personal history of antineoplastic chemotherapy: Secondary | ICD-10-CM | POA: Diagnosis not present
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
## 2020-05-18 ENCOUNTER — Ambulatory Visit (HOSPITAL_COMMUNITY)
Admission: RE | Admit: 2020-05-18 | Discharge: 2020-05-18 | Disposition: A | Discharge status: Home / Self Care | Payer: Medicaid Other | Source: Ambulatory Visit | Attending: Oncology | Admitting: Oncology

## 2020-05-18 ENCOUNTER — Encounter (HOSPITAL_COMMUNITY): Payer: Self-pay

## 2020-05-18 ENCOUNTER — Other Ambulatory Visit: Payer: Self-pay | Admitting: Oncology

## 2020-05-18 ENCOUNTER — Inpatient Hospital Stay: Payer: Medicaid Other

## 2020-05-18 ENCOUNTER — Other Ambulatory Visit: Payer: Self-pay

## 2020-05-18 DIAGNOSIS — C3490 Malignant neoplasm of unspecified part of unspecified bronchus or lung: Secondary | ICD-10-CM | POA: Diagnosis not present

## 2020-05-18 DIAGNOSIS — M8789 Other osteonecrosis, multiple sites: Secondary | ICD-10-CM | POA: Diagnosis not present

## 2020-05-18 DIAGNOSIS — C7931 Secondary malignant neoplasm of brain: Secondary | ICD-10-CM | POA: Diagnosis not present

## 2020-05-18 DIAGNOSIS — M5126 Other intervertebral disc displacement, lumbar region: Secondary | ICD-10-CM | POA: Diagnosis not present

## 2020-05-18 DIAGNOSIS — Z5111 Encounter for antineoplastic chemotherapy: Secondary | ICD-10-CM | POA: Diagnosis not present

## 2020-05-18 DIAGNOSIS — I2510 Atherosclerotic heart disease of native coronary artery without angina pectoris: Secondary | ICD-10-CM | POA: Diagnosis not present

## 2020-05-18 LAB — CBC WITH DIFFERENTIAL (CANCER CENTER ONLY)
Abs Immature Granulocytes: 0.03 10*3/uL (ref 0.00–0.07)
Basophils Absolute: 0 10*3/uL (ref 0.0–0.1)
Basophils Relative: 0 %
Eosinophils Absolute: 0 10*3/uL (ref 0.0–0.5)
Eosinophils Relative: 0 %
HCT: 33.9 % — ABNORMAL LOW (ref 39.0–52.0)
Hemoglobin: 11.1 g/dL — ABNORMAL LOW (ref 13.0–17.0)
Immature Granulocytes: 0 %
Lymphocytes Relative: 27 %
Lymphs Abs: 2.5 10*3/uL (ref 0.7–4.0)
MCH: 27.5 pg (ref 26.0–34.0)
MCHC: 32.7 g/dL (ref 30.0–36.0)
MCV: 84.1 fL (ref 80.0–100.0)
Monocytes Absolute: 0.6 10*3/uL (ref 0.1–1.0)
Monocytes Relative: 7 %
Neutro Abs: 6.3 10*3/uL (ref 1.7–7.7)
Neutrophils Relative %: 66 %
Platelet Count: 291 10*3/uL (ref 150–400)
RBC: 4.03 MIL/uL — ABNORMAL LOW (ref 4.22–5.81)
RDW: 14.1 % (ref 11.5–15.5)
WBC Count: 9.5 10*3/uL (ref 4.0–10.5)
nRBC: 0 % (ref 0.0–0.2)

## 2020-05-18 LAB — CMP (CANCER CENTER ONLY)
ALT: 17 U/L (ref 0–44)
AST: 20 U/L (ref 15–41)
Albumin: 3.7 g/dL (ref 3.5–5.0)
Alkaline Phosphatase: 75 U/L (ref 38–126)
Anion gap: 10 (ref 5–15)
BUN: 5 mg/dL — ABNORMAL LOW (ref 6–20)
CO2: 24 mmol/L (ref 22–32)
Calcium: 8.9 mg/dL (ref 8.9–10.3)
Chloride: 104 mmol/L (ref 98–111)
Creatinine: 0.91 mg/dL (ref 0.61–1.24)
GFR, Estimated: >60 mL/min (ref >60)
Glucose, Bld: 117 mg/dL — ABNORMAL HIGH (ref 70–99)
Potassium: 3.6 mmol/L (ref 3.5–5.1)
Sodium: 138 mmol/L (ref 135–145)
Total Bilirubin: <0.2 mg/dL — ABNORMAL LOW (ref 0.3–1.2)
Total Protein: 8.1 g/dL (ref 6.5–8.1)

## 2020-05-18 MED ORDER — IOHEXOL 300 MG/ML  SOLN
100.0000 mL | Freq: Once | INTRAMUSCULAR | Status: AC | PRN
  Administered 2020-05-18: 100 mL via INTRAVENOUS

## 2020-05-25 ENCOUNTER — Other Ambulatory Visit: Payer: Self-pay

## 2020-05-25 ENCOUNTER — Inpatient Hospital Stay (HOSPITAL_BASED_OUTPATIENT_CLINIC_OR_DEPARTMENT_OTHER): Payer: Medicaid Other | Admitting: Oncology

## 2020-05-25 DIAGNOSIS — M79604 Pain in right leg: Secondary | ICD-10-CM

## 2020-05-25 DIAGNOSIS — M79605 Pain in left leg: Secondary | ICD-10-CM

## 2020-05-25 DIAGNOSIS — C7931 Secondary malignant neoplasm of brain: Secondary | ICD-10-CM

## 2020-05-25 DIAGNOSIS — C763 Malignant neoplasm of pelvis: Secondary | ICD-10-CM

## 2020-05-25 DIAGNOSIS — C3490 Malignant neoplasm of unspecified part of unspecified bronchus or lung: Secondary | ICD-10-CM | POA: Diagnosis not present

## 2020-05-25 MED ORDER — OXYCODONE HCL 15 MG PO TABS: 15.0000 mg | ORAL_TABLET | ORAL | 0 refills | Status: DC | PRN

## 2020-05-25 NOTE — Progress Notes (Signed)
Hematology and Oncology Follow Up   Eric Mason [id number] 24-Nov-1970 50 y.o. 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  Primary tumor is unknown likely originating from a pulmonary primary.   Prior Therapy:Status post craniotomy and resection in May 2018. This was followed by [facility] completed in June 2018.  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  Carboplatin and paclitaxel with Pembrolizumab cycle 1 given on 06/18/2017.  He completed 4 cycles of carboplatin and paclitaxel and completed 6 cycles of Pembrolizumab total.  He is status post repeat radiation to L5 presumed tumor ablated in June 2020.  Current therapy: Active surveillance.   Interim History: Eric Mason presents today for a follow-up visit.  Since the last visit, he reports no major changes in his health.  He denies any headaches, blurry vision, syncope or seizures.  He continues to have chronic lower back pain without any neurological deficits.  His appetite remained excellent and is gained more weight.     Medications: Unchanged on review. Current Outpatient Medications  Medication Sig Dispense Refill  . amitriptyline (ELAVIL) 75 MG tablet Take 1 tablet (75 mg total) by mouth at bedtime. 30 tablet 3  . B Complex-C (B-COMPLEX WITH VITAMIN C) tablet Take 1 tablet by mouth daily.     . baclofen (LIORESAL) 10 MG tablet Take 1 tablet (10 mg total) by mouth 2 (two) times daily. 60 each 3  . bisacodyl (DULCOLAX) 5 MG EC tablet Take 2 tablets (10 mg total) by mouth daily as needed for moderate constipation. 30 tablet 0  . busPIRone (BUSPAR) 10 MG tablet Take 1 tablet (10 mg total) by mouth 2 (two) times daily. 60 tablet 6  . Cholecalciferol  (VITAMIN D) 50 MCG (2000 UT) CAPS Take 2,000 Units by mouth daily.    [name] dexamethasone (DECADRON) 1 MG tablet Take 1 tablet (1 mg total) by mouth daily. 30 tablet 4  . DULoxetine (CYMBALTA) 30 MG capsule Take 1 capsule (30 mg total) by mouth daily. 30 capsule 5  . folic acid (FOLVITE) 1 MG tablet Take 1 tablet (1 mg total) by mouth daily. 100 tablet 1  . gabapentin (NEURONTIN) 800 MG tablet Take 1 tablet (800 mg total) by mouth 3 (three) times daily. 90 tablet 3  . levETIRAcetam (KEPPRA) 1000 MG tablet Take 2 tablets (2,000 mg total) by mouth 2 (two) times daily. 120 tablet 3  . Multiple Vitamin (MULTIVITAMIN) tablet Take 1 tablet by mouth daily. 30 tablet 0  . NARCAN 4 MG/0.1ML LIQD nasal spray kit See admin instructions. (Patient not taking: Reported on 05/16/2020)    . nicotine (NICODERM CQ - DOSED IN MG/24 HOURS) 21 mg/24hr patch Place 1 patch (21 mg total) onto the skin daily. 28 patch 0  . oxyCODONE (ROXICODONE) 15 MG immediate release tablet Take 1 tablet (15 mg total) by mouth every 4 (four) hours as needed for pain. 126 tablet 0  . pantoprazole (PROTONIX) 40 MG tablet Take 1 tablet(s) by mouth TWO TIMES DAILY 60 tablet 5  . polyethylene glycol (MIRALAX / GLYCOLAX) 17 g packet Take 17 g by mouth daily as needed for moderate constipation.    [name] PROAIR HFA 108 (90 Base) MCG/ACT inhaler Inhale 2 puffs into the lungs every 6 (six) hours as  needed for wheezing or shortness of breath.(200/8=25) 8.5 g 6  . promethazine (PHENERGAN) 25 MG tablet Take 1 tablet (25 mg total) by mouth every 6 (six) hours as needed for nausea or vomiting. 30 tablet 0  . senna-docusate (SENOKOT-S) 8.6-50 MG tablet Take 2 tablets by mouth 2 (two) times daily. 30 tablet 1  . thiamine (VITAMIN B-1) 100 MG tablet Take 1 tablet (100 mg total) by mouth daily. 100 tablet 0   No current facility-administered medications for this visit.   Facility-Administered Medications Ordered in Other Visits  Medication Dose Route Frequency  Provider Last Rate Last Admin  . sodium chloride flush (NS) 0.9 % injection 10 mL  10 mL Intracatheter PRN Wyatt Portela, MD   10 mL at 06/18/17 1815     Allergies:  Allergies  Allergen Reactions  . Latex Itching and Rash     Physical examination:  Blood pressure 121/74, pulse 70, temperature (!) 97.3 F (36.3 C), temperature source Tympanic, resp. rate 18, height 5' 10" (1.778 m), weight 178 lb 6.4 oz (80.9 kg), SpO2 99 %.     ECOG 1     General appearance: Comfortable appearing without any discomfort Head: Normocephalic without any trauma Oropharynx: Mucous membranes are moist and pink without any thrush or ulcers. Eyes: Pupils are equal and round reactive to light. Lymph nodes: No cervical, supraclavicular, inguinal or axillary lymphadenopathy.   Heart:regular rate and rhythm.  S1 and S2 without leg edema. Lung: Clear without any rhonchi or wheezes.  No dullness to percussion. Abdomin: Soft, nontender, nondistended with good bowel sounds.  No hepatosplenomegaly. Musculoskeletal: No joint deformity or effusion.  Full range of motion noted. Neurological: No deficits noted on motor, sensory and deep tendon reflex exam. Skin: No petechial rash or dryness.  Appeared moist.             Lab Results: Lab Results  Component Value Date   WBC 9.5 05/18/2020   HGB 11.1 (L) 05/18/2020   HCT 33.9 (L) 05/18/2020   MCV 84.1 05/18/2020   PLT 291 05/18/2020     Chemistry      Component Value Date/Time   NA 138 05/18/2020 1507   NA 137 10/30/2016 0909   K 3.6 05/18/2020 1507   K 3.3 (L) 10/30/2016 0909   CL 104 05/18/2020 1507   CO2 24 05/18/2020 1507   CO2 27 10/30/2016 0909   BUN 5 (L) 05/18/2020 1507   BUN 4.8 (L) 10/30/2016 0909   CREATININE 0.91 05/18/2020 1507   CREATININE 0.9 10/30/2016 0909      Component Value Date/Time   CALCIUM 8.9 05/18/2020 1507   CALCIUM 9.3 10/30/2016 0909   ALKPHOS 75 05/18/2020 1507   ALKPHOS 93 10/30/2016 0909   AST 20  05/18/2020 1507   AST 35 (H) 10/30/2016 0909   ALT 17 05/18/2020 1507   ALT 25 10/30/2016 0909   BILITOT <0.2 (L) 05/18/2020 1507   BILITOT 0.41 10/30/2016 0909     I  IMPRESSION: 1. Compared to the CT of the chest of 07/14/2019, decrease in resolution of thoracic adenopathy. 2. Ill-defined nodules detailed on the 07/14/2019 CTA chest have resolved. The solid nodules detailed on the CT of 05/13/2017 are similar, presumed benign. 3. No acute process or evidence of metastatic disease in the abdomen or pelvis. 4. Coronary artery atherosclerosis. Aortic Atherosclerosis (ICD10-I70.0). 5. Age advanced coronary artery atherosclerosis. Recommend assessment of coronary risk factors and consideration of medical therapy. 6. Bilateral chronic    Impression  and Plan:  50 year old man with:  1.    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  His disease status was updated today and treatment options were reviewed.  CT scan obtained on May 18, 2020 was reviewed today and showed no clear evidence of disease progression.  Pulmonary nodules as well as lymphadenopathy either resolved or other benign etiology.  At this time, I see no role for any additional systemic therapy I recommended follow-up and surveillance at this time.  I recommend repeat imaging studies in the next 6 to 8 months.   2.  CNS and spinal metastasis: Recent MRI in February 2022 showed disease in the posterior fossa around the previous treatment.  Evaluation by Dr. Mickeal Skinner without clinical signs of progression.  He favored his tumor necrosis.  3.  Port-A-Cath management: The risks and benefits of removing his Port-A-Cath were discussed.  I recommended keeping it for the time being and will continue flushing every 2 months.  4.  Pain: Mostly generalized related to his malignancy and cancer treatments.  He has had radicular pain in the back and systemic pain related to  previous chemotherapy exposure.  He is currently on Keppra, Elavil and gabapentin for neuropathic pain and that uses oxycodone for severe pain.     5. Follow-up:  In 6 months after repeat imaging studies.  30  minutes were dedicated to this encounter.  Time was spent on reviewing disease status, reviewing imaging studies and outlining future plan of care.    Zola Button, MD 05/25/2020 11:10 AM

## 2020-05-26 ENCOUNTER — Telehealth: Payer: Self-pay | Admitting: Oncology

## 2020-05-26 NOTE — Telephone Encounter (Signed)
Left message with follow-up appointments per 3/16 los. Gave option to call back to reschedule if needed. 

## 2020-06-07 ENCOUNTER — Other Ambulatory Visit: Payer: Self-pay | Admitting: *Deleted

## 2020-06-07 MED ORDER — GABAPENTIN 800 MG PO TABS
800.0000 mg | ORAL_TABLET | Freq: Three times a day (TID) | ORAL | 3 refills | Status: DC
Start: 2020-06-07 — End: 2021-05-11

## 2020-06-07 MED ORDER — GABAPENTIN 800 MG PO TABS
800.0000 mg | ORAL_TABLET | Freq: Three times a day (TID) | ORAL | 3 refills | Status: DC
Start: 2020-06-07 — End: 2020-06-07

## 2020-06-10 ENCOUNTER — Other Ambulatory Visit: Payer: Self-pay | Admitting: Radiation Therapy

## 2020-06-15 ENCOUNTER — Other Ambulatory Visit: Payer: Self-pay | Admitting: *Deleted

## 2020-06-15 DIAGNOSIS — C3490 Malignant neoplasm of unspecified part of unspecified bronchus or lung: Secondary | ICD-10-CM

## 2020-06-15 DIAGNOSIS — M79605 Pain in left leg: Secondary | ICD-10-CM

## 2020-06-15 DIAGNOSIS — C763 Malignant neoplasm of pelvis: Secondary | ICD-10-CM

## 2020-06-15 DIAGNOSIS — C7931 Secondary malignant neoplasm of brain: Secondary | ICD-10-CM

## 2020-06-15 DIAGNOSIS — M79604 Pain in right leg: Secondary | ICD-10-CM

## 2020-06-15 MED ORDER — OXYCODONE HCL 15 MG PO TABS: 15.0000 mg | ORAL_TABLET | ORAL | 0 refills | Status: DC | PRN

## 2020-06-15 NOTE — Telephone Encounter (Signed)
Patient requested refill of oxycodone, last prescribed 05/25/20.

## 2020-06-28 ENCOUNTER — Other Ambulatory Visit: Payer: Self-pay | Admitting: *Deleted

## 2020-06-28 DIAGNOSIS — M79604 Pain in right leg: Secondary | ICD-10-CM

## 2020-06-28 DIAGNOSIS — C7931 Secondary malignant neoplasm of brain: Secondary | ICD-10-CM

## 2020-06-28 DIAGNOSIS — C763 Malignant neoplasm of pelvis: Secondary | ICD-10-CM

## 2020-06-28 DIAGNOSIS — C3490 Malignant neoplasm of unspecified part of unspecified bronchus or lung: Secondary | ICD-10-CM

## 2020-06-28 DIAGNOSIS — M79605 Pain in left leg: Secondary | ICD-10-CM

## 2020-06-28 DIAGNOSIS — R112 Nausea with vomiting, unspecified: Secondary | ICD-10-CM

## 2020-06-28 MED ORDER — OXYCODONE HCL 15 MG PO TABS: 15.0000 mg | ORAL_TABLET | ORAL | 0 refills | Status: DC | PRN

## 2020-07-04 ENCOUNTER — Other Ambulatory Visit: Payer: Self-pay | Admitting: *Deleted

## 2020-07-04 MED ORDER — BACLOFEN 10 MG PO TABS: 10.0000 mg | ORAL_TABLET | Freq: Two times a day (BID) | ORAL | 0 refills | Status: DC

## 2020-07-14 ENCOUNTER — Other Ambulatory Visit: Payer: Self-pay | Admitting: Internal Medicine

## 2020-07-14 MED ORDER — LEVETIRACETAM 1000 MG PO TABS: 2000.0000 mg | ORAL_TABLET | Freq: Two times a day (BID) | ORAL | 3 refills | Status: DC

## 2020-07-20 ENCOUNTER — Other Ambulatory Visit: Payer: Self-pay | Admitting: *Deleted

## 2020-07-20 DIAGNOSIS — M79604 Pain in right leg: Secondary | ICD-10-CM

## 2020-07-20 DIAGNOSIS — C7931 Secondary malignant neoplasm of brain: Secondary | ICD-10-CM

## 2020-07-20 DIAGNOSIS — C3490 Malignant neoplasm of unspecified part of unspecified bronchus or lung: Secondary | ICD-10-CM

## 2020-07-20 DIAGNOSIS — C763 Malignant neoplasm of pelvis: Secondary | ICD-10-CM

## 2020-07-20 NOTE — Telephone Encounter (Signed)
Patient called - requested refill of pain medication. Refill request routed to Dr. Alen Blew

## 2020-07-21 MED ORDER — OXYCODONE HCL 15 MG PO TABS: 15.0000 mg | ORAL_TABLET | ORAL | 0 refills | Status: DC | PRN

## 2020-07-25 ENCOUNTER — Other Ambulatory Visit: Payer: Self-pay

## 2020-07-25 ENCOUNTER — Inpatient Hospital Stay: Payer: Medicaid Other | Attending: Internal Medicine

## 2020-07-25 ENCOUNTER — Other Ambulatory Visit: Payer: Self-pay | Admitting: *Deleted

## 2020-07-25 DIAGNOSIS — C801 Malignant (primary) neoplasm, unspecified: Secondary | ICD-10-CM | POA: Insufficient documentation

## 2020-07-25 DIAGNOSIS — Z95828 Presence of other vascular implants and grafts: Secondary | ICD-10-CM

## 2020-07-25 DIAGNOSIS — C7931 Secondary malignant neoplasm of brain: Secondary | ICD-10-CM | POA: Diagnosis not present

## 2020-07-25 DIAGNOSIS — Z452 Encounter for adjustment and management of vascular access device: Secondary | ICD-10-CM | POA: Insufficient documentation

## 2020-07-25 MED ORDER — HEPARIN SOD (PORK) LOCK FLUSH 100 UNIT/ML IV SOLN
500.0000 [IU] | Freq: Once | INTRAVENOUS | Status: AC
  Administered 2020-07-25: 500 [IU]
  Filled 2020-07-25: qty 5

## 2020-07-25 MED ORDER — SODIUM CHLORIDE 0.9% FLUSH
10.0000 mL | Freq: Once | INTRAVENOUS | Status: AC
  Administered 2020-07-25: 10 mL
  Filled 2020-07-25: qty 10

## 2020-08-04 ENCOUNTER — Other Ambulatory Visit: Payer: Self-pay | Admitting: Oncology

## 2020-08-04 DIAGNOSIS — G893 Neoplasm related pain (acute) (chronic): Secondary | ICD-10-CM

## 2020-08-09 ENCOUNTER — Other Ambulatory Visit: Payer: Self-pay | Admitting: *Deleted

## 2020-08-09 DIAGNOSIS — M79604 Pain in right leg: Secondary | ICD-10-CM

## 2020-08-09 DIAGNOSIS — C763 Malignant neoplasm of pelvis: Secondary | ICD-10-CM

## 2020-08-09 DIAGNOSIS — C3490 Malignant neoplasm of unspecified part of unspecified bronchus or lung: Secondary | ICD-10-CM

## 2020-08-09 DIAGNOSIS — M79605 Pain in left leg: Secondary | ICD-10-CM

## 2020-08-09 DIAGNOSIS — C7931 Secondary malignant neoplasm of brain: Secondary | ICD-10-CM

## 2020-08-09 MED ORDER — OXYCODONE HCL 15 MG PO TABS: 15.0000 mg | ORAL_TABLET | ORAL | 0 refills | Status: DC | PRN

## 2020-08-12 ENCOUNTER — Ambulatory Visit
Admission: RE | Admit: 2020-08-12 | Discharge: 2020-08-12 | Disposition: A | Discharge status: Home / Self Care | Payer: Medicaid Other | Source: Ambulatory Visit | Attending: Internal Medicine | Admitting: Internal Medicine

## 2020-08-12 DIAGNOSIS — C7931 Secondary malignant neoplasm of brain: Secondary | ICD-10-CM

## 2020-08-12 MED ORDER — GADOBENATE DIMEGLUMINE 529 MG/ML IV SOLN
14.0000 mL | Freq: Once | INTRAVENOUS | Status: AC | PRN
  Administered 2020-08-12: 14 mL via INTRAVENOUS

## 2020-08-15 ENCOUNTER — Inpatient Hospital Stay: Payer: Medicaid Other | Attending: Internal Medicine

## 2020-08-15 DIAGNOSIS — C801 Malignant (primary) neoplasm, unspecified: Secondary | ICD-10-CM | POA: Insufficient documentation

## 2020-08-15 DIAGNOSIS — Z79899 Other long term (current) drug therapy: Secondary | ICD-10-CM | POA: Insufficient documentation

## 2020-08-15 DIAGNOSIS — C7931 Secondary malignant neoplasm of brain: Secondary | ICD-10-CM | POA: Insufficient documentation

## 2020-08-16 ENCOUNTER — Other Ambulatory Visit: Payer: Self-pay

## 2020-08-16 ENCOUNTER — Inpatient Hospital Stay (HOSPITAL_BASED_OUTPATIENT_CLINIC_OR_DEPARTMENT_OTHER): Payer: Medicaid Other | Admitting: Internal Medicine

## 2020-08-16 VITALS — BP 124/84 | HR 65 | Temp 98.7°F | Resp 17 | Wt 173.0 lb

## 2020-08-16 DIAGNOSIS — Z79899 Other long term (current) drug therapy: Secondary | ICD-10-CM | POA: Diagnosis not present

## 2020-08-16 DIAGNOSIS — C801 Malignant (primary) neoplasm, unspecified: Secondary | ICD-10-CM | POA: Diagnosis present

## 2020-08-16 DIAGNOSIS — C7931 Secondary malignant neoplasm of brain: Secondary | ICD-10-CM | POA: Diagnosis not present

## 2020-08-16 DIAGNOSIS — R569 Unspecified convulsions: Secondary | ICD-10-CM

## 2020-08-16 MED ORDER — BACLOFEN 10 MG PO TABS: 10.0000 mg | ORAL_TABLET | Freq: Three times a day (TID) | ORAL | 5 refills | Status: DC
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
## 2020-08-18 ENCOUNTER — Other Ambulatory Visit: Payer: Self-pay | Admitting: Radiation Therapy

## 2020-08-24 ENCOUNTER — Telehealth: Payer: Self-pay

## 2020-08-24 NOTE — Telephone Encounter (Signed)
Pt called requesting something that indicates his dx. I have printed the pts most recent AVS and he states he will come pick it up today no later than 4pm. Pt states he currently lives in a hotel and is trying to get some housing.

## 2020-08-26 ENCOUNTER — Other Ambulatory Visit: Payer: Self-pay | Admitting: *Deleted

## 2020-08-26 DIAGNOSIS — M79604 Pain in right leg: Secondary | ICD-10-CM

## 2020-08-26 DIAGNOSIS — C3490 Malignant neoplasm of unspecified part of unspecified bronchus or lung: Secondary | ICD-10-CM

## 2020-08-26 DIAGNOSIS — C763 Malignant neoplasm of pelvis: Secondary | ICD-10-CM

## 2020-08-26 DIAGNOSIS — C7931 Secondary malignant neoplasm of brain: Secondary | ICD-10-CM

## 2020-08-26 MED ORDER — OXYCODONE HCL 15 MG PO TABS: 15.0000 mg | ORAL_TABLET | ORAL | 0 refills | Status: DC | PRN

## 2020-09-15 ENCOUNTER — Other Ambulatory Visit: Payer: Self-pay | Admitting: Oncology

## 2020-09-15 DIAGNOSIS — C3490 Malignant neoplasm of unspecified part of unspecified bronchus or lung: Secondary | ICD-10-CM

## 2020-09-15 DIAGNOSIS — M79604 Pain in right leg: Secondary | ICD-10-CM

## 2020-09-15 DIAGNOSIS — C7931 Secondary malignant neoplasm of brain: Secondary | ICD-10-CM

## 2020-09-15 DIAGNOSIS — C763 Malignant neoplasm of pelvis: Secondary | ICD-10-CM

## 2020-09-15 MED ORDER — OXYCODONE HCL 15 MG PO TABS: 15.0000 mg | ORAL_TABLET | ORAL | 0 refills | Status: DC | PRN

## 2020-09-23 ENCOUNTER — Telehealth: Payer: Self-pay | Admitting: Oncology

## 2020-09-23 ENCOUNTER — Other Ambulatory Visit: Payer: Self-pay

## 2020-09-23 ENCOUNTER — Inpatient Hospital Stay: Payer: Medicaid Other | Attending: Internal Medicine

## 2020-09-23 DIAGNOSIS — Z452 Encounter for adjustment and management of vascular access device: Secondary | ICD-10-CM | POA: Diagnosis not present

## 2020-09-23 DIAGNOSIS — Z95828 Presence of other vascular implants and grafts: Secondary | ICD-10-CM

## 2020-09-23 DIAGNOSIS — C801 Malignant (primary) neoplasm, unspecified: Secondary | ICD-10-CM | POA: Insufficient documentation

## 2020-09-23 DIAGNOSIS — C7931 Secondary malignant neoplasm of brain: Secondary | ICD-10-CM | POA: Diagnosis not present

## 2020-09-23 MED ORDER — SODIUM CHLORIDE 0.9% FLUSH
10.0000 mL | Freq: Once | INTRAVENOUS | Status: AC
  Administered 2020-09-23: 10 mL
  Filled 2020-09-23: qty 10

## 2020-09-23 MED ORDER — HEPARIN SOD (PORK) LOCK FLUSH 100 UNIT/ML IV SOLN
500.0000 [IU] | Freq: Once | INTRAVENOUS | Status: AC
  Administered 2020-09-23: 500 [IU]
  Filled 2020-09-23: qty 5

## 2020-09-23 NOTE — Telephone Encounter (Signed)
Called patient regarding upcoming appointments, patient is notified. 

## 2020-10-03 ENCOUNTER — Other Ambulatory Visit: Payer: Self-pay | Admitting: *Deleted

## 2020-10-03 MED ORDER — DEXAMETHASONE 1 MG PO TABS: 1.0000 mg | ORAL_TABLET | Freq: Every day | ORAL | 3 refills | Status: DC

## 2020-10-04 ENCOUNTER — Other Ambulatory Visit: Payer: Self-pay | Admitting: *Deleted

## 2020-10-04 DIAGNOSIS — C7931 Secondary malignant neoplasm of brain: Secondary | ICD-10-CM

## 2020-10-04 DIAGNOSIS — C3490 Malignant neoplasm of unspecified part of unspecified bronchus or lung: Secondary | ICD-10-CM

## 2020-10-04 DIAGNOSIS — M79604 Pain in right leg: Secondary | ICD-10-CM

## 2020-10-04 DIAGNOSIS — C763 Malignant neoplasm of pelvis: Secondary | ICD-10-CM

## 2020-10-04 MED ORDER — OXYCODONE HCL 15 MG PO TABS: 15.0000 mg | ORAL_TABLET | ORAL | 0 refills | Status: DC | PRN

## 2020-10-21 ENCOUNTER — Other Ambulatory Visit: Payer: Self-pay | Admitting: Oncology

## 2020-10-21 DIAGNOSIS — C7931 Secondary malignant neoplasm of brain: Secondary | ICD-10-CM

## 2020-10-21 DIAGNOSIS — C763 Malignant neoplasm of pelvis: Secondary | ICD-10-CM

## 2020-10-21 DIAGNOSIS — C3490 Malignant neoplasm of unspecified part of unspecified bronchus or lung: Secondary | ICD-10-CM

## 2020-10-21 DIAGNOSIS — M79604 Pain in right leg: Secondary | ICD-10-CM

## 2020-10-21 MED ORDER — OXYCODONE HCL 15 MG PO TABS
15.0000 mg | ORAL_TABLET | ORAL | 0 refills | Status: DC | PRN
Start: 2020-10-21 — End: 2020-11-15

## 2020-10-27 ENCOUNTER — Other Ambulatory Visit: Payer: Self-pay

## 2020-10-27 ENCOUNTER — Encounter: Payer: Self-pay | Admitting: Physician Assistant

## 2020-10-27 ENCOUNTER — Ambulatory Visit: Payer: Medicaid Other | Attending: Physician Assistant | Admitting: Physician Assistant

## 2020-10-27 VITALS — BP 120/77 | HR 84 | Ht 70.0 in | Wt 167.0 lb

## 2020-10-27 DIAGNOSIS — M549 Dorsalgia, unspecified: Secondary | ICD-10-CM | POA: Insufficient documentation

## 2020-10-27 DIAGNOSIS — C220 Liver cell carcinoma: Secondary | ICD-10-CM | POA: Insufficient documentation

## 2020-10-27 DIAGNOSIS — C3490 Malignant neoplasm of unspecified part of unspecified bronchus or lung: Secondary | ICD-10-CM | POA: Insufficient documentation

## 2020-10-27 DIAGNOSIS — Z7182 Exercise counseling: Secondary | ICD-10-CM | POA: Diagnosis not present

## 2020-10-27 DIAGNOSIS — M79604 Pain in right leg: Secondary | ICD-10-CM | POA: Diagnosis not present

## 2020-10-27 DIAGNOSIS — R0602 Shortness of breath: Secondary | ICD-10-CM | POA: Insufficient documentation

## 2020-10-27 MED ORDER — KETOROLAC TROMETHAMINE 60 MG/2ML IM SOLN
60.0000 mg | Freq: Once | INTRAMUSCULAR | Status: AC
  Administered 2020-10-27: 60 mg via INTRAMUSCULAR
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
## 2020-10-27 NOTE — Progress Notes (Signed)
Having pain in lower back and legs.

## 2020-10-28 LAB — COMPREHENSIVE METABOLIC PANEL
ALT: 15 IU/L (ref 0–44)
AST: 23 IU/L (ref 0–40)
Albumin/Globulin Ratio: 1.3 (ref 1.2–2.2)
Albumin: 4.3 g/dL (ref 4.0–5.0)
Alkaline Phosphatase: 90 IU/L (ref 44–121)
BUN/Creatinine Ratio: 10 (ref 9–20)
BUN: 9 mg/dL (ref 6–24)
Bilirubin Total: 0.2 mg/dL (ref 0.0–1.2)
CO2: 25 mmol/L (ref 20–29)
Calcium: 9.3 mg/dL (ref 8.7–10.2)
Chloride: 101 mmol/L (ref 96–106)
Creatinine, Ser: 0.9 mg/dL (ref 0.76–1.27)
Globulin, Total: 3.4 g/dL (ref 1.5–4.5)
Glucose: 102 mg/dL — ABNORMAL HIGH (ref 65–99)
Potassium: 3.9 mmol/L (ref 3.5–5.2)
Sodium: 140 mmol/L (ref 134–144)
Total Protein: 7.7 g/dL (ref 6.0–8.5)
eGFR: 104 mL/min/{1.73_m2} (ref >59)

## 2020-10-28 LAB — CBC WITH DIFFERENTIAL/PLATELET
Basophils Absolute: 0 10*3/uL (ref 0.0–0.2)
Basos: 0 %
EOS (ABSOLUTE): 0 10*3/uL (ref 0.0–0.4)
Eos: 1 %
Hematocrit: 34.9 % — ABNORMAL LOW (ref 37.5–51.0)
Hemoglobin: 10.6 g/dL — ABNORMAL LOW (ref 13.0–17.7)
Immature Grans (Abs): 0 10*3/uL (ref 0.0–0.1)
Immature Granulocytes: 0 %
Lymphocytes Absolute: 2.6 10*3/uL (ref 0.7–3.1)
Lymphs: 38 %
MCH: 23.8 pg — ABNORMAL LOW (ref 26.6–33.0)
MCHC: 30.4 g/dL — ABNORMAL LOW (ref 31.5–35.7)
MCV: 78 fL — ABNORMAL LOW (ref 79–97)
Monocytes Absolute: 0.4 10*3/uL (ref 0.1–0.9)
Monocytes: 6 %
Neutrophils Absolute: 3.7 10*3/uL (ref 1.4–7.0)
Neutrophils: 55 %
Platelets: 368 10*3/uL (ref 150–450)
RBC: 4.46 x10E6/uL (ref 4.14–5.80)
RDW: 16.6 % — ABNORMAL HIGH (ref 11.6–15.4)
WBC: 6.7 10*3/uL (ref 3.4–10.8)

## 2020-10-31 ENCOUNTER — Other Ambulatory Visit: Payer: Self-pay

## 2020-10-31 ENCOUNTER — Ambulatory Visit (HOSPITAL_COMMUNITY)
Admission: RE | Admit: 2020-10-31 | Discharge: 2020-10-31 | Disposition: A | Discharge status: Home / Self Care | Payer: Medicaid Other | Source: Ambulatory Visit | Attending: Physician Assistant | Admitting: Physician Assistant

## 2020-10-31 ENCOUNTER — Telehealth: Payer: Self-pay

## 2020-10-31 DIAGNOSIS — M79604 Pain in right leg: Secondary | ICD-10-CM | POA: Insufficient documentation

## 2020-10-31 NOTE — Progress Notes (Signed)
Right lower extremity venous duplex has been completed. Preliminary results can be found in CV Proc through chart review.  Results were given to [facility] at [place] office.  10/31/20 2:46 PM Carlos Levering RVT

## 2020-10-31 NOTE — Telephone Encounter (Signed)
Report from [facility]

## 2020-11-01 NOTE — Telephone Encounter (Signed)
Contacted pt to go over provider message pt didn't answer left a detailed vm and if he has any questions or concerns to give [place] a call

## 2020-11-07 NOTE — Telephone Encounter (Signed)
Patient phone was broke unable to retrieve VM and would like a follow up call today from nurse

## 2020-11-07 NOTE — Telephone Encounter (Signed)
Returned pt call. Pt is aware.  Pt states something is really hurting him and he doesn't know what it is. Pt states when he gets up in the morning and he takes a step with his right leg it hurts and he goes down to the floor. Pt states he doesn't know what he should do.   Pt states he has been taking Oxycodone 15mg .

## 2020-11-08 NOTE — Telephone Encounter (Signed)
Eric Mason has spoken with pt this morning in regards to this   "Pt name and DOB verified. Patient aware of results and result note per Freeman Caldron, PA-C   He expresses concerns for increased amount of pain. Advised patient to speak with his oncologist to discuss medications. Reminded of upcoming appointment.  Patient grateful for discussion.  "

## 2020-11-15 ENCOUNTER — Other Ambulatory Visit: Payer: Self-pay | Admitting: Oncology

## 2020-11-15 ENCOUNTER — Telehealth: Payer: Self-pay

## 2020-11-15 DIAGNOSIS — C3490 Malignant neoplasm of unspecified part of unspecified bronchus or lung: Secondary | ICD-10-CM

## 2020-11-15 DIAGNOSIS — M79604 Pain in right leg: Secondary | ICD-10-CM

## 2020-11-15 DIAGNOSIS — C763 Malignant neoplasm of pelvis: Secondary | ICD-10-CM

## 2020-11-15 DIAGNOSIS — C7931 Secondary malignant neoplasm of brain: Secondary | ICD-10-CM

## 2020-11-15 NOTE — Telephone Encounter (Signed)
Patient notified that Prior Authorization request for Oxycodone was sent to insurance company through covermymeds and response received that Prior Authorization is not necessary due to his diagnosis.

## 2020-11-17 ENCOUNTER — Other Ambulatory Visit: Payer: Self-pay | Admitting: *Deleted

## 2020-11-17 MED ORDER — LEVETIRACETAM 1000 MG PO TABS: 2000.0000 mg | ORAL_TABLET | Freq: Two times a day (BID) | ORAL | 3 refills | Status: DC

## 2020-11-21 ENCOUNTER — Other Ambulatory Visit: Payer: Self-pay | Admitting: *Deleted

## 2020-11-21 MED ORDER — AMITRIPTYLINE HCL 75 MG PO TABS: 75.0000 mg | ORAL_TABLET | Freq: Every day | ORAL | 3 refills | Status: DC

## 2020-11-23 IMAGING — MR MR HEAD WO/W CM
11 series · 48 of 48 positions shown · IV contrast (multihance)
Comparison: Brain MRI 06/10/2019

CLINICAL DATA: Brain/CNS neoplasm, assess treatment response

Lung carcinoma metastatic to the brain. Right temporal craniotomy
and resection on 07/11/2016. SRS planning.
EXAM:
MRI HEAD WITHOUT AND WITH CONTRAST
TECHNIQUE: Multiplanar, multiecho pulse sequences of the brain and surrounding
structures were obtained without and with intravenous contrast.
CONTRAST:  15mL MULTIHANCE GADOBENATE DIMEGLUMINE 529 MG/ML IV SOLN

[Series 2: FLAIR · sagittal · 3.0mm · 0.75mm/px · 2 of 39 slices shown (1 of 2)]
[im 1/39]
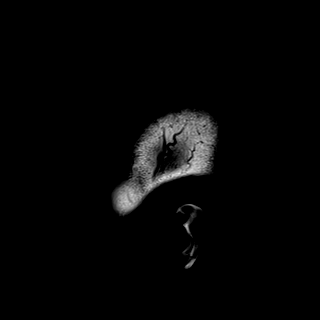
[im 39/39]
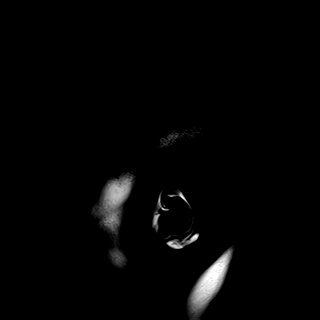

[Series 3: DWI · axial · 3.0mm · 1.50mm/px · z∈[-70,+78]mm · 5 of 78 slices shown (1 of 2)]
[im 1/78]
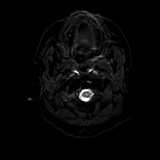
[im 20/78]
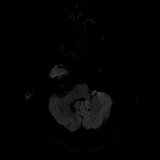
[im 39/78]
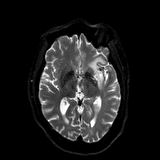
[im 58/78]
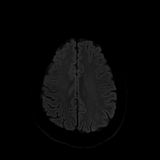
[im 78/78]
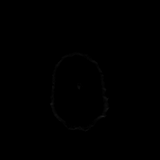

[Series 4: DWI · axial · 3.0mm · 1.50mm/px · z∈[-70,+78]mm · 2 of 39 slices shown (2 of 2)]
[im 1/39]
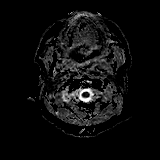
[im 39/39]
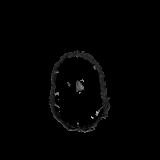

[Series 5: T2 · axial · 5.0mm · 0.57mm/px · z∈[-75,+81]mm · 2 of 27 slices shown]
[im 1/27]
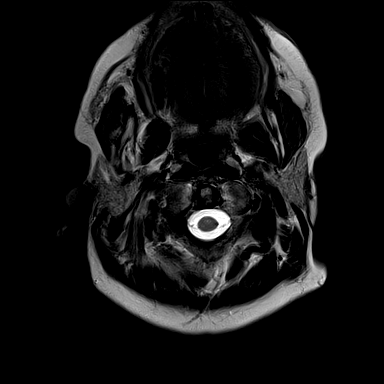
[im 27/27]
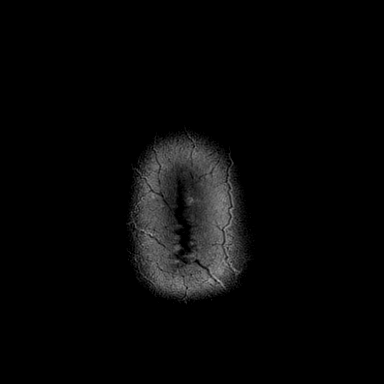

[Series 7: swi_images · axial · 1.5mm · 0.90mm/px · z∈[-68,+74]mm · 6 of 96 slices shown]
[im 1/96]
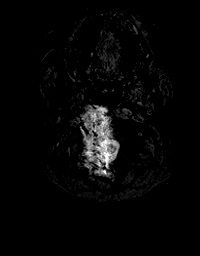
[im 20/96]
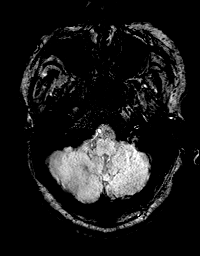
[im 39/96]
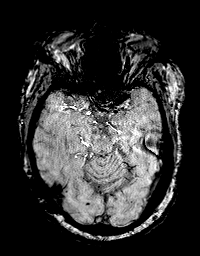
[im 58/96]
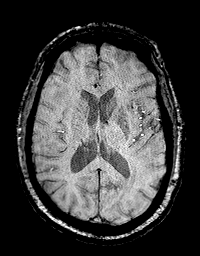
[im 77/96]
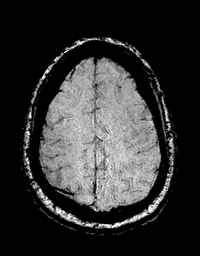
[im 96/96]
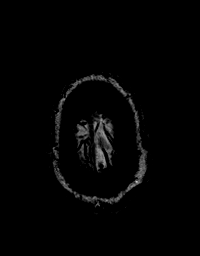

[Series 8: FLAIR · axial · 3.0mm · 0.86mm/px · z∈[-79,+86]mm · 3 of 56 slices shown (2 of 2)]
[im 1/56]
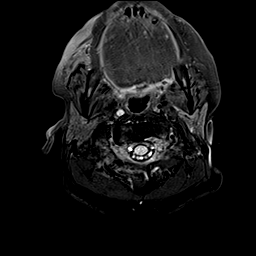
[im 28/56]
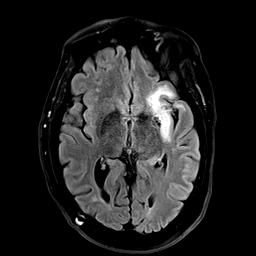
[im 56/56]
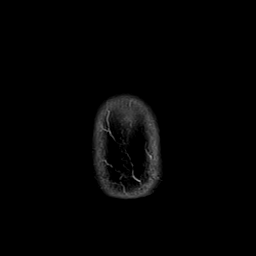

[Series 9: T1 · axial · 1.0mm · 0.75mm/px · z∈[-76,+83]mm · 10 of 160 slices shown]
[im 1/160]
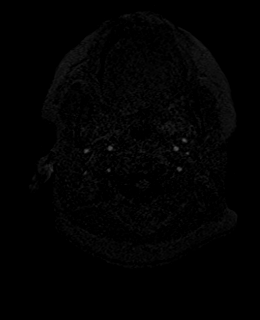
[im 18/160]
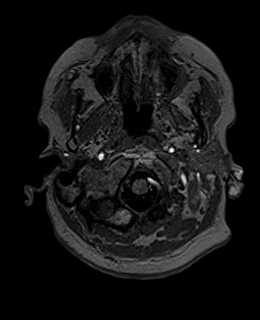
[im 36/160]
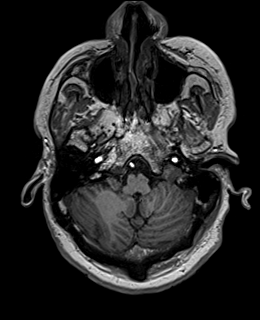
[im 54/160]
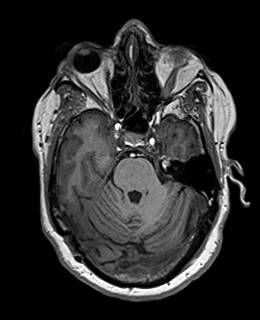
[im 71/160]
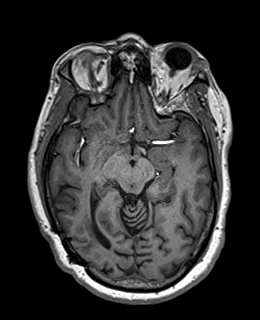
[im 89/160]
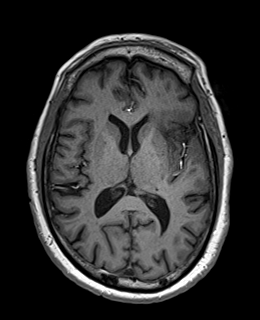
[im 107/160]
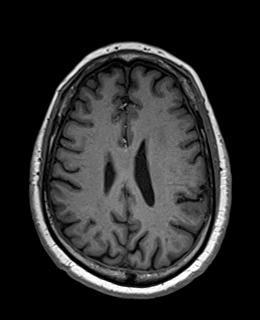
[im 124/160]
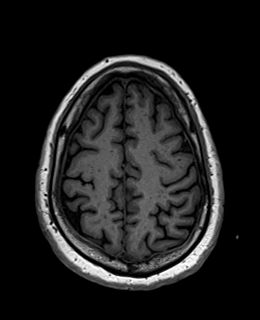
[im 142/160]
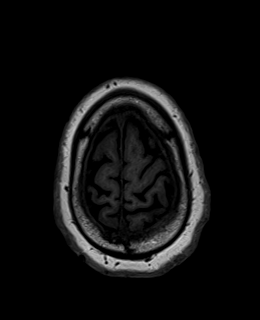
[im 160/160]
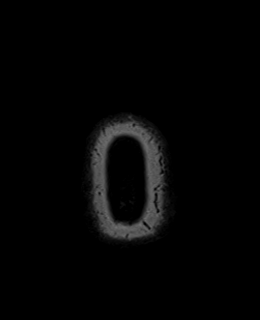

[Series 10: T2 post-contrast · coronal · 3.0mm · 0.57mm/px · 3 of 47 slices shown]
[im 1/47]
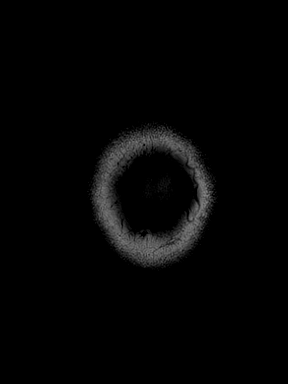
[im 24/47]
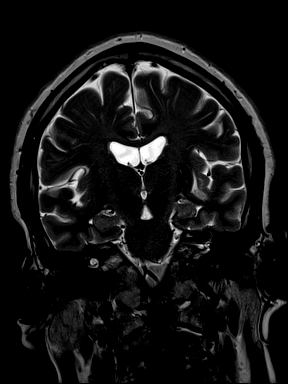
[im 47/47]
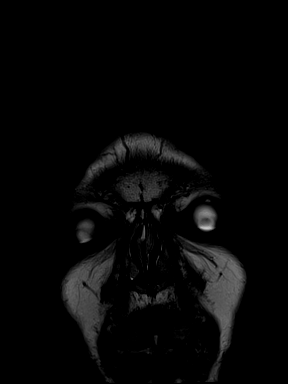

[Series 11: T1 post-contrast · axial · 1.0mm · 0.75mm/px · z∈[-76,+83]mm · 10 of 160 slices shown (1 of 2)]
[im 1/160]
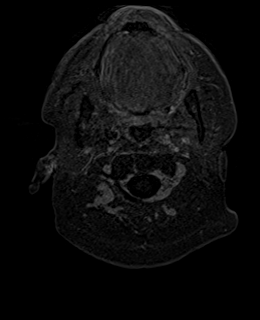
[im 18/160]
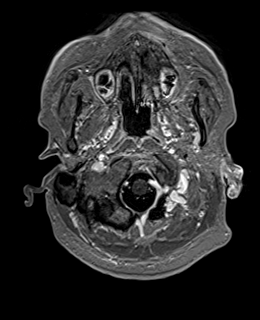
[im 36/160]
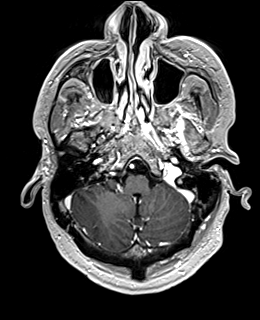
[im 54/160]
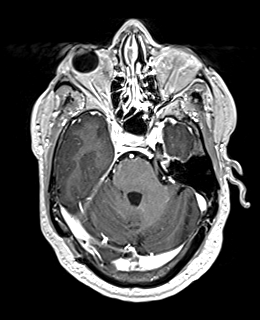
[im 71/160]
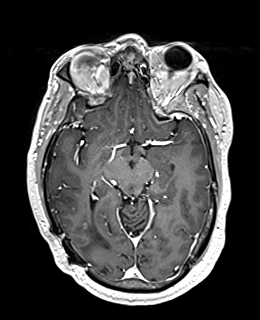
[im 89/160]
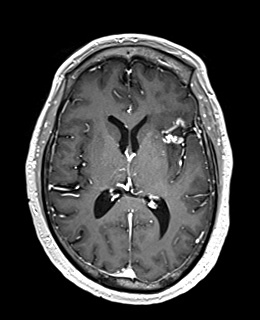
[im 107/160]
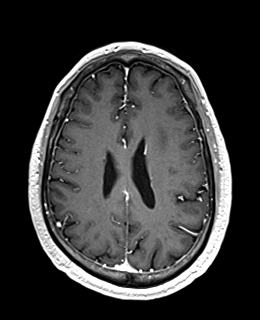
[im 124/160]
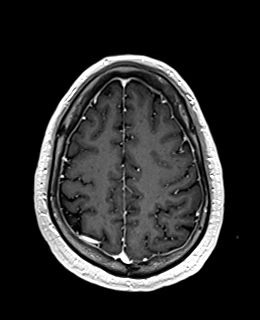
[im 142/160]
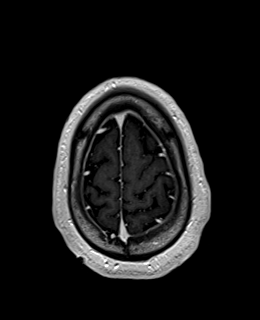
[im 160/160]
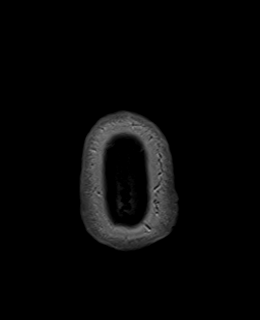

[Series 12: T1 post-contrast · coronal · 3.0mm · 0.57mm/px · 3 of 47 slices shown (2 of 2)]
[im 1/47]
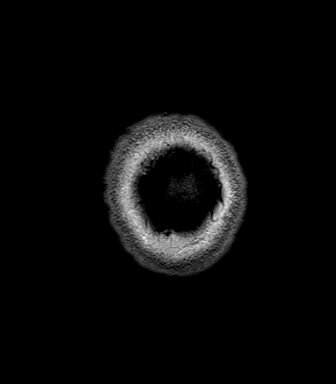
[im 24/47]
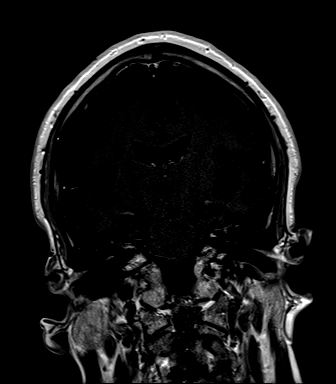
[im 47/47]
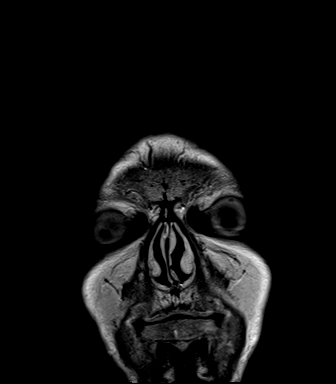

[Series 13: FLAIR post-contrast · sagittal · 3.0mm · 0.75mm/px · 2 of 39 slices shown]
[im 1/39]
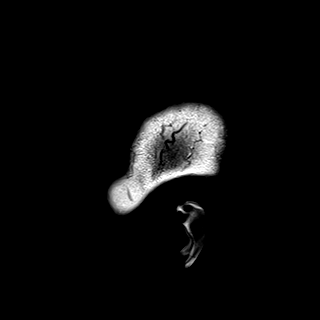
[im 39/39]
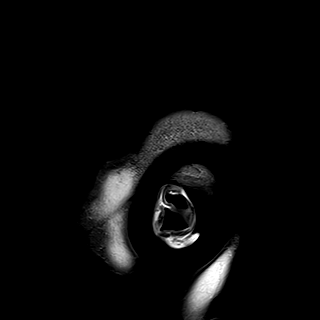

[48 of 48 positions shown; findings below may reference images not displayed]

FINDINGS: Brain: No acute infarct, acute hemorrhage or extra-axial collection.
There is hyperintense T2-weighted signal within the left insula and
frontal operculum, the anterior left temporal lobe and the right
occipital lobe, unchanged. Normal volume of CSF spaces. No chronic
microhemorrhage. Normal midline structures. There are multiple
contrast-enhancing lesions:

1. Inferior medial right cerebellum, 5 mm, new, series 11, image 43
2. Medial right cerebellum, 7 mm, new image 46
3. Medial right cerebellum, 3 mm, new, image 46
4. Right parieto-occipital junction, 9 mm, unchanged, image 66
5. Lateral right temporal lobe, 4 mm, previously punctate, image 72
6. Left insula region of peripheral contrast enhancement, unchanged,
image 87
7. Left frontal operculum, 6 mm, previously 3 mm, image 99

Vascular: Normal flow voids.

Skull and upper cervical spine: Normal marrow signal.

Sinuses/Orbits: Negative.

Other: None.
IMPRESSION: 1. Three new clustered right cerebellar metastatic lesions, in
addition to increased size of 2 supratentorial lesions.
2. Unchanged right parieto-occipital and left insular areas of
contrast enhancement.

## 2020-11-25 ENCOUNTER — Inpatient Hospital Stay: Payer: Medicaid Other | Attending: Internal Medicine

## 2020-11-25 ENCOUNTER — Other Ambulatory Visit: Payer: Medicaid Other

## 2020-11-25 ENCOUNTER — Ambulatory Visit (HOSPITAL_COMMUNITY): Admission: RE | Admit: 2020-11-25 | Payer: Medicaid Other | Source: Ambulatory Visit

## 2020-11-25 DIAGNOSIS — Z79899 Other long term (current) drug therapy: Secondary | ICD-10-CM | POA: Insufficient documentation

## 2020-11-25 DIAGNOSIS — C801 Malignant (primary) neoplasm, unspecified: Secondary | ICD-10-CM | POA: Insufficient documentation

## 2020-11-25 DIAGNOSIS — Z7952 Long term (current) use of systemic steroids: Secondary | ICD-10-CM | POA: Insufficient documentation

## 2020-11-25 DIAGNOSIS — Z923 Personal history of irradiation: Secondary | ICD-10-CM | POA: Insufficient documentation

## 2020-11-25 DIAGNOSIS — C7931 Secondary malignant neoplasm of brain: Secondary | ICD-10-CM | POA: Insufficient documentation

## 2020-11-25 DIAGNOSIS — Z9221 Personal history of antineoplastic chemotherapy: Secondary | ICD-10-CM | POA: Insufficient documentation

## 2020-12-01 ENCOUNTER — Other Ambulatory Visit: Payer: Self-pay | Admitting: *Deleted

## 2020-12-01 DIAGNOSIS — M79604 Pain in right leg: Secondary | ICD-10-CM

## 2020-12-01 DIAGNOSIS — C763 Malignant neoplasm of pelvis: Secondary | ICD-10-CM

## 2020-12-01 DIAGNOSIS — C7931 Secondary malignant neoplasm of brain: Secondary | ICD-10-CM

## 2020-12-01 DIAGNOSIS — M79605 Pain in left leg: Secondary | ICD-10-CM

## 2020-12-01 DIAGNOSIS — C3490 Malignant neoplasm of unspecified part of unspecified bronchus or lung: Secondary | ICD-10-CM

## 2020-12-01 MED ORDER — OXYCODONE HCL 15 MG PO TABS: ORAL_TABLET | ORAL | 0 refills | Status: DC

## 2020-12-02 ENCOUNTER — Inpatient Hospital Stay: Payer: Medicaid Other | Admitting: Oncology

## 2020-12-02 ENCOUNTER — Telehealth: Payer: Self-pay | Admitting: Oncology

## 2020-12-02 NOTE — Telephone Encounter (Signed)
Sch per 9/22 pt request, unable to leave msg

## 2020-12-09 ENCOUNTER — Inpatient Hospital Stay (HOSPITAL_BASED_OUTPATIENT_CLINIC_OR_DEPARTMENT_OTHER): Payer: Medicaid Other | Admitting: Oncology

## 2020-12-09 ENCOUNTER — Other Ambulatory Visit: Payer: Self-pay

## 2020-12-09 ENCOUNTER — Inpatient Hospital Stay: Payer: Medicaid Other

## 2020-12-09 VITALS — BP 130/92 | HR 93 | Temp 99.2°F | Resp 18 | Wt 172.1 lb

## 2020-12-09 DIAGNOSIS — C3490 Malignant neoplasm of unspecified part of unspecified bronchus or lung: Secondary | ICD-10-CM

## 2020-12-09 DIAGNOSIS — Z7952 Long term (current) use of systemic steroids: Secondary | ICD-10-CM | POA: Diagnosis not present

## 2020-12-09 DIAGNOSIS — Z79899 Other long term (current) drug therapy: Secondary | ICD-10-CM | POA: Diagnosis not present

## 2020-12-09 DIAGNOSIS — Z9221 Personal history of antineoplastic chemotherapy: Secondary | ICD-10-CM | POA: Diagnosis not present

## 2020-12-09 DIAGNOSIS — C801 Malignant (primary) neoplasm, unspecified: Secondary | ICD-10-CM | POA: Diagnosis present

## 2020-12-09 DIAGNOSIS — C7931 Secondary malignant neoplasm of brain: Secondary | ICD-10-CM | POA: Diagnosis present

## 2020-12-09 DIAGNOSIS — Z923 Personal history of irradiation: Secondary | ICD-10-CM | POA: Diagnosis not present

## 2020-12-09 LAB — CBC WITH DIFFERENTIAL (CANCER CENTER ONLY)
Abs Immature Granulocytes: 0.02 10*3/uL (ref 0.00–0.07)
Basophils Absolute: 0 10*3/uL (ref 0.0–0.1)
Basophils Relative: 0 %
Eosinophils Absolute: 0.1 10*3/uL (ref 0.0–0.5)
Eosinophils Relative: 1 %
HCT: 31.6 % — ABNORMAL LOW (ref 39.0–52.0)
Hemoglobin: 10 g/dL — ABNORMAL LOW (ref 13.0–17.0)
Immature Granulocytes: 0 %
Lymphocytes Relative: 31 %
Lymphs Abs: 2.8 10*3/uL (ref 0.7–4.0)
MCH: 24.4 pg — ABNORMAL LOW (ref 26.0–34.0)
MCHC: 31.6 g/dL (ref 30.0–36.0)
MCV: 77.3 fL — ABNORMAL LOW (ref 80.0–100.0)
Monocytes Absolute: 0.8 10*3/uL (ref 0.1–1.0)
Monocytes Relative: 9 %
Neutro Abs: 5.1 10*3/uL (ref 1.7–7.7)
Neutrophils Relative %: 59 %
Platelet Count: 305 10*3/uL (ref 150–400)
RBC: 4.09 MIL/uL — ABNORMAL LOW (ref 4.22–5.81)
RDW: 18.3 % — ABNORMAL HIGH (ref 11.5–15.5)
WBC Count: 8.9 10*3/uL (ref 4.0–10.5)
nRBC: 0 % (ref 0.0–0.2)

## 2020-12-09 LAB — CMP (CANCER CENTER ONLY)
ALT: 12 U/L (ref 0–44)
AST: 17 U/L (ref 15–41)
Albumin: 3.6 g/dL (ref 3.5–5.0)
Alkaline Phosphatase: 99 U/L (ref 38–126)
Anion gap: 11 (ref 5–15)
BUN: 11 mg/dL (ref 6–20)
CO2: 26 mmol/L (ref 22–32)
Calcium: 9.3 mg/dL (ref 8.9–10.3)
Chloride: 102 mmol/L (ref 98–111)
Creatinine: 0.88 mg/dL (ref 0.61–1.24)
GFR, Estimated: >60 mL/min (ref >60)
Glucose, Bld: 93 mg/dL (ref 70–99)
Potassium: 3.9 mmol/L (ref 3.5–5.1)
Sodium: 139 mmol/L (ref 135–145)
Total Bilirubin: 0.3 mg/dL (ref 0.3–1.2)
Total Protein: 8.3 g/dL — ABNORMAL HIGH (ref 6.5–8.1)

## 2020-12-09 MED ORDER — DICLOFENAC SODIUM 1 % EX GEL: 2.0000 g | Freq: Four times a day (QID) | CUTANEOUS | 1 refills | Status: DC

## 2020-12-09 NOTE — Progress Notes (Signed)
Hematology and Oncology Follow Up   Eric Mason [id number] 1971/01/13 50 y.o. 12/09/2020 9:43 AM [name], [name], [name], MD       Principle Diagnosis: 50 year old man with stage IV malignancy and CNS involvement diagnosed in 2018.  He was found to have poorly differentiated cancer with a presumed lung primary.   Prior Therapy:  Status post craniotomy and resection in May 2018. This was followed by [facility] completed in June 2018.   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   Carboplatin and paclitaxel with Pembrolizumab cycle 1 given on 06/18/2017.  He completed 4 cycles of carboplatin and paclitaxel and completed 6 cycles of Pembrolizumab total.   He is status post repeat radiation to L5 presumed tumor ablated in June 2020.   Current therapy: Observation and surveillance.   Interim History: Mr. Credit returns today for repeat evaluation.  Since the last visit, he reports no major changes in his health.  He continues to have chronic back pain which is currently manageable with pain medication.  He denies any neurological deficits or hospitalizations.  He denies any constitutional symptoms.  His appetite remained reasonable and has gained weight.     Medications: Updated on review. Current Outpatient Medications  Medication Sig Dispense Refill   amitriptyline (ELAVIL) 75 MG tablet Take 1 tablet (75 mg total) by mouth at bedtime. 30 tablet 3   B Complex-C (B-COMPLEX WITH VITAMIN C) tablet Take 1 tablet by mouth daily.      baclofen (LIORESAL) 10 MG tablet Take 1 tablet (10 mg total) by mouth 3 (three) times daily. 90 each 5   bisacodyl (DULCOLAX) 5 MG EC tablet Take 2 tablets (10 mg total) by mouth daily as needed for moderate constipation. 30 tablet 0   busPIRone (BUSPAR) 10 MG tablet Take 1 tablet (10 mg total) by mouth 2 (two) times  daily. 60 tablet 6   Cholecalciferol (VITAMIN D) 50 MCG (2000 UT) CAPS Take 2,000 Units by mouth daily.     dexamethasone (DECADRON) 1 MG tablet Take 1 tablet (1 mg total) by mouth daily. 30 tablet 3   DULoxetine (CYMBALTA) 30 MG capsule Take 1 capsule (30 mg total) by mouth daily. 30 capsule 5   folic acid (FOLVITE) 1 MG tablet Take 1 tablet (1 mg total) by mouth daily. 100 tablet 1   gabapentin (NEURONTIN) 800 MG tablet Take 1 tablet (800 mg total) by mouth 3 (three) times daily. 90 tablet 3   levETIRAcetam (KEPPRA) 1000 MG tablet Take 2 tablets (2,000 mg total) by mouth 2 (two) times daily. 120 tablet 3   Multiple Vitamin (MULTIVITAMIN) tablet Take 1 tablet by mouth daily. 30 tablet 0   NARCAN 4 MG/0.1ML LIQD nasal spray kit See admin instructions.     nicotine (NICODERM CQ - DOSED IN MG/24 HOURS) 21 mg/24hr patch Place 1 patch (21 mg total) onto the skin daily. 28 patch 0   oxyCODONE (ROXICODONE) 15 MG immediate release tablet Take 1 tablet (15 mg total) by mouth every 4 (four) hours as needed 126 tablet 0   pantoprazole (PROTONIX) 40 MG tablet Take 1 tablet(s) by mouth TWO TIMES DAILY 60 tablet 5   polyethylene glycol (MIRALAX / GLYCOLAX) 17 g packet Take 17 g by mouth daily as needed for moderate constipation.     PROAIR HFA 108 (90 Base) MCG/ACT inhaler Inhale 2 puffs into the lungs every  6 (six) hours as needed for wheezing or shortness of breath.(200/8=25) 8.5 g 6   promethazine (PHENERGAN) 25 MG tablet Take 1 tablet (25 mg total) by mouth every 6 (six) hours as needed for nausea or vomiting. 30 tablet 0   senna-docusate (SENOKOT-S) 8.6-50 MG tablet Take 2 tablets by mouth 2 (two) times daily. 30 tablet 1   thiamine (VITAMIN B-1) 100 MG tablet Take 1 tablet (100 mg total) by mouth daily. 100 tablet 0   No current facility-administered medications for this visit.   Facility-Administered Medications Ordered in Other Visits  Medication Dose Route Frequency Provider Last Rate Last Admin    sodium chloride flush (NS) 0.9 % injection 10 mL  10 mL Intracatheter PRN Wyatt Portela, MD   10 mL at 06/18/17 1815     Allergies:  Allergies  Allergen Reactions   Latex Itching and Rash     Physical examination:    Blood pressure (!) 130/92, pulse 93, temperature 99.2 F (37.3 C), temperature source Oral, resp. rate 18, weight 172 lb 1.6 oz (78.1 kg), SpO2 95 %.    ECOG 1   General appearance: Alert, awake without any distress. Head: Atraumatic without abnormalities Oropharynx: Without any thrush or ulcers. Eyes: No scleral icterus. Lymph nodes: No lymphadenopathy noted in the cervical, supraclavicular, or axillary nodes Heart:regular rate and rhythm, without any murmurs or gallops.   Lung: Clear to auscultation without any rhonchi, wheezes or dullness to percussion. Abdomin: Soft, nontender without any shifting dullness or ascites. Musculoskeletal: No clubbing or cyanosis. Neurological: No motor or sensory deficits. Skin: No rashes or lesions. Psychiatric: Mood and affect appeared normal.            Lab Results: Lab Results  Component Value Date   WBC 6.7 10/27/2020   HGB 10.6 (L) 10/27/2020   HCT 34.9 (L) 10/27/2020   MCV 78 (L) 10/27/2020   PLT 368 10/27/2020     Chemistry      Component Value Date/Time   NA 140 10/27/2020 1521   NA 137 10/30/2016 0909   K 3.9 10/27/2020 1521   K 3.3 (L) 10/30/2016 0909   CL 101 10/27/2020 1521   CO2 25 10/27/2020 1521   CO2 27 10/30/2016 0909   BUN 9 10/27/2020 1521   BUN 4.8 (L) 10/30/2016 0909   CREATININE 0.90 10/27/2020 1521   CREATININE 0.91 05/18/2020 1507   CREATININE 0.9 10/30/2016 0909      Component Value Date/Time   CALCIUM 9.3 10/27/2020 1521   CALCIUM 9.3 10/30/2016 0909   ALKPHOS 90 10/27/2020 1521   ALKPHOS 93 10/30/2016 0909   AST 23 10/27/2020 1521   AST 20 05/18/2020 1507   AST 35 (H) 10/30/2016 0909   ALT 15 10/27/2020 1521   ALT 17 05/18/2020 1507   ALT 25 10/30/2016 0909    BILITOT 0.2 10/27/2020 1521   BILITOT <0.2 (L) 05/18/2020 1507   BILITOT 0.41 10/30/2016 0909        Impression and Plan:  50 year old man with:   1.    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   He is currently on active surveillance without any evidence of relapsed disease systemically.  Treatment options moving forward were reviewed and salvage therapy including systemic chemotherapy with immunotherapy as well as oral targeted therapy among others were reviewed.  These options would be implemented if he has documented metastatic disease.  We will repeat imaging  studies in the near future.    2.  CNS and spinal metastasis: MRI obtained on June 3 of the brain did not show any evidence of metastatic progression.  He continues to follow with Dr. Mickeal Skinner.  3.  Port-A-Cath management: Remains in place and will be flushed every 2 weeks.   4.  Pain: In the spine related to metastatic malignancy.  Is currently on oxycodone which will be refilled for him.  He is also on Keppra, Elavil and gabapentin.    6. Follow-up:  In 6 months for repeat evaluation.   30  minutes were spent on this visit.  The time was dedicated to reviewing laboratory data, disease status update, reviewing imaging studies and management choices for the future.    Zola Button, MD 12/09/2020 9:43 AM

## 2020-12-12 ENCOUNTER — Ambulatory Visit: Payer: Medicaid Other | Admitting: Internal Medicine

## 2020-12-19 ENCOUNTER — Telehealth: Payer: Self-pay | Admitting: *Deleted

## 2020-12-19 ENCOUNTER — Other Ambulatory Visit: Payer: Self-pay | Admitting: Oncology

## 2020-12-19 DIAGNOSIS — C3490 Malignant neoplasm of unspecified part of unspecified bronchus or lung: Secondary | ICD-10-CM

## 2020-12-19 DIAGNOSIS — C763 Malignant neoplasm of pelvis: Secondary | ICD-10-CM

## 2020-12-19 DIAGNOSIS — M79604 Pain in right leg: Secondary | ICD-10-CM

## 2020-12-19 DIAGNOSIS — M79605 Pain in left leg: Secondary | ICD-10-CM

## 2020-12-19 DIAGNOSIS — C7931 Secondary malignant neoplasm of brain: Secondary | ICD-10-CM

## 2020-12-19 NOTE — Telephone Encounter (Signed)
Received vm call from pt stating that he needs refill on his pain med. Returned call & verified that it is for his oxycodone & he says is due on Thurs.  Message routed to Dr Alen Blew.

## 2020-12-20 ENCOUNTER — Encounter: Payer: Self-pay | Admitting: Oncology

## 2020-12-21 ENCOUNTER — Other Ambulatory Visit: Payer: Self-pay | Admitting: *Deleted

## 2020-12-30 ENCOUNTER — Telehealth: Payer: Self-pay | Admitting: *Deleted

## 2020-12-30 NOTE — Telephone Encounter (Signed)
PC to patient, informed him his CT has been authorized by insurance, needs to be done by 01/20/21.  Patient given number to Central Scheduling to schedule his CT, he verbalizes understanding.

## 2021-01-04 ENCOUNTER — Other Ambulatory Visit: Payer: Self-pay | Admitting: *Deleted

## 2021-01-04 DIAGNOSIS — C3490 Malignant neoplasm of unspecified part of unspecified bronchus or lung: Secondary | ICD-10-CM

## 2021-01-04 DIAGNOSIS — C7931 Secondary malignant neoplasm of brain: Secondary | ICD-10-CM

## 2021-01-04 DIAGNOSIS — M79604 Pain in right leg: Secondary | ICD-10-CM

## 2021-01-04 DIAGNOSIS — C763 Malignant neoplasm of pelvis: Secondary | ICD-10-CM

## 2021-01-06 ENCOUNTER — Other Ambulatory Visit: Payer: Self-pay | Admitting: Oncology

## 2021-01-06 ENCOUNTER — Telehealth: Payer: Self-pay | Admitting: *Deleted

## 2021-01-06 DIAGNOSIS — C763 Malignant neoplasm of pelvis: Secondary | ICD-10-CM

## 2021-01-06 DIAGNOSIS — C3490 Malignant neoplasm of unspecified part of unspecified bronchus or lung: Secondary | ICD-10-CM

## 2021-01-06 DIAGNOSIS — M79604 Pain in right leg: Secondary | ICD-10-CM

## 2021-01-06 DIAGNOSIS — C7931 Secondary malignant neoplasm of brain: Secondary | ICD-10-CM

## 2021-01-06 MED ORDER — OXYCODONE HCL 15 MG PO TABS
ORAL_TABLET | ORAL | 0 refills | Status: DC
Start: 2021-01-06 — End: 2021-01-23

## 2021-01-06 NOTE — Telephone Encounter (Signed)
Pt states he needs a refill of pain medication

## 2021-01-11 ENCOUNTER — Other Ambulatory Visit: Payer: Self-pay

## 2021-01-11 ENCOUNTER — Ambulatory Visit (HOSPITAL_COMMUNITY)
Admission: RE | Admit: 2021-01-11 | Discharge: 2021-01-11 | Disposition: A | Discharge status: Home / Self Care | Payer: Medicaid Other | Source: Ambulatory Visit | Attending: Oncology | Admitting: Oncology

## 2021-01-11 DIAGNOSIS — I2510 Atherosclerotic heart disease of native coronary artery without angina pectoris: Secondary | ICD-10-CM | POA: Diagnosis not present

## 2021-01-11 DIAGNOSIS — J439 Emphysema, unspecified: Secondary | ICD-10-CM | POA: Diagnosis not present

## 2021-01-11 DIAGNOSIS — C3490 Malignant neoplasm of unspecified part of unspecified bronchus or lung: Secondary | ICD-10-CM | POA: Insufficient documentation

## 2021-01-11 DIAGNOSIS — J8410 Pulmonary fibrosis, unspecified: Secondary | ICD-10-CM | POA: Diagnosis not present

## 2021-01-11 DIAGNOSIS — I700 Atherosclerosis of aorta: Secondary | ICD-10-CM | POA: Diagnosis not present

## 2021-01-11 DIAGNOSIS — M47816 Spondylosis without myelopathy or radiculopathy, lumbar region: Secondary | ICD-10-CM | POA: Diagnosis not present

## 2021-01-11 MED ORDER — IOHEXOL 350 MG/ML SOLN
80.0000 mL | Freq: Once | INTRAVENOUS | Status: AC | PRN
  Administered 2021-01-11: 80 mL via INTRAVENOUS

## 2021-01-19 ENCOUNTER — Other Ambulatory Visit: Payer: Self-pay | Admitting: Oncology

## 2021-01-19 DIAGNOSIS — G893 Neoplasm related pain (acute) (chronic): Secondary | ICD-10-CM

## 2021-01-23 ENCOUNTER — Other Ambulatory Visit: Payer: Self-pay | Admitting: Oncology

## 2021-01-23 ENCOUNTER — Telehealth: Payer: Self-pay | Admitting: *Deleted

## 2021-01-23 DIAGNOSIS — M79604 Pain in right leg: Secondary | ICD-10-CM

## 2021-01-23 DIAGNOSIS — C3490 Malignant neoplasm of unspecified part of unspecified bronchus or lung: Secondary | ICD-10-CM

## 2021-01-23 DIAGNOSIS — C763 Malignant neoplasm of pelvis: Secondary | ICD-10-CM

## 2021-01-23 DIAGNOSIS — C7931 Secondary malignant neoplasm of brain: Secondary | ICD-10-CM

## 2021-01-23 MED ORDER — OXYCODONE HCL 15 MG PO TABS: ORAL_TABLET | ORAL | 0 refills | Status: DC

## 2021-01-23 NOTE — Telephone Encounter (Signed)
Patient called requesting refill of Oxycodone to pharmacy on file.  Routed to MD to review.

## 2021-01-26 ENCOUNTER — Other Ambulatory Visit: Payer: Self-pay | Admitting: *Deleted

## 2021-01-26 ENCOUNTER — Telehealth: Payer: Self-pay | Admitting: *Deleted

## 2021-01-26 DIAGNOSIS — R112 Nausea with vomiting, unspecified: Secondary | ICD-10-CM

## 2021-01-26 MED ORDER — PANTOPRAZOLE SODIUM 40 MG PO TBEC: DELAYED_RELEASE_TABLET | ORAL | 5 refills | Status: DC

## 2021-01-26 NOTE — Telephone Encounter (Signed)
PC to patient, informed him his CT is unchanged, no metastatic disease noted.  He verbalizes understanding.

## 2021-01-26 NOTE — Telephone Encounter (Signed)
-----   Message from Wyatt Portela, MD sent at 01/26/2021  1:37 PM EST ----- No changes noted on his scan. No cancer noted. Thanks ----- Message ----- From: Rolene Course, RN Sent: 01/26/2021   1:12 PM EST To: Wyatt Portela, MD  This patient called requesting the results of his recent CT scan.

## 2021-02-08 ENCOUNTER — Inpatient Hospital Stay: Payer: Medicaid Other | Attending: Internal Medicine

## 2021-02-09 ENCOUNTER — Other Ambulatory Visit: Payer: Self-pay | Admitting: Oncology

## 2021-02-09 ENCOUNTER — Telehealth: Payer: Self-pay | Admitting: *Deleted

## 2021-02-09 DIAGNOSIS — C3490 Malignant neoplasm of unspecified part of unspecified bronchus or lung: Secondary | ICD-10-CM

## 2021-02-09 DIAGNOSIS — C7931 Secondary malignant neoplasm of brain: Secondary | ICD-10-CM

## 2021-02-09 DIAGNOSIS — M79604 Pain in right leg: Secondary | ICD-10-CM

## 2021-02-09 DIAGNOSIS — C763 Malignant neoplasm of pelvis: Secondary | ICD-10-CM

## 2021-02-09 MED ORDER — OXYCODONE HCL 15 MG PO TABS: ORAL_TABLET | ORAL | 0 refills | Status: DC

## 2021-02-09 NOTE — Telephone Encounter (Signed)
Eric Mason called for a refill of pain medication

## 2021-02-13 ENCOUNTER — Inpatient Hospital Stay: Payer: Medicaid Other

## 2021-02-16 ENCOUNTER — Inpatient Hospital Stay: Payer: Medicaid Other | Admitting: Internal Medicine

## 2021-02-27 ENCOUNTER — Other Ambulatory Visit: Payer: Self-pay

## 2021-02-27 DIAGNOSIS — C763 Malignant neoplasm of pelvis: Secondary | ICD-10-CM

## 2021-02-27 DIAGNOSIS — M79605 Pain in left leg: Secondary | ICD-10-CM

## 2021-02-27 DIAGNOSIS — C3490 Malignant neoplasm of unspecified part of unspecified bronchus or lung: Secondary | ICD-10-CM

## 2021-02-27 DIAGNOSIS — C7931 Secondary malignant neoplasm of brain: Secondary | ICD-10-CM

## 2021-02-28 ENCOUNTER — Encounter: Payer: Self-pay | Admitting: Oncology

## 2021-02-28 MED ORDER — OXYCODONE HCL 15 MG PO TABS
ORAL_TABLET | ORAL | 0 refills | Status: DC
Start: 2021-02-28 — End: 2021-03-20

## 2021-03-17 ENCOUNTER — Other Ambulatory Visit: Payer: Self-pay

## 2021-03-17 DIAGNOSIS — C7931 Secondary malignant neoplasm of brain: Secondary | ICD-10-CM

## 2021-03-17 MED ORDER — BACLOFEN 10 MG PO TABS: 10.0000 mg | ORAL_TABLET | Freq: Three times a day (TID) | ORAL | 5 refills | Status: DC

## 2021-03-19 ENCOUNTER — Inpatient Hospital Stay: Admission: RE | Admit: 2021-03-19 | Payer: Medicaid Other | Source: Ambulatory Visit

## 2021-03-20 ENCOUNTER — Other Ambulatory Visit: Payer: Self-pay | Admitting: Oncology

## 2021-03-20 ENCOUNTER — Telehealth: Payer: Self-pay | Admitting: *Deleted

## 2021-03-20 DIAGNOSIS — C763 Malignant neoplasm of pelvis: Secondary | ICD-10-CM

## 2021-03-20 DIAGNOSIS — C3490 Malignant neoplasm of unspecified part of unspecified bronchus or lung: Secondary | ICD-10-CM

## 2021-03-20 DIAGNOSIS — M79604 Pain in right leg: Secondary | ICD-10-CM

## 2021-03-20 DIAGNOSIS — C7931 Secondary malignant neoplasm of brain: Secondary | ICD-10-CM

## 2021-03-20 MED ORDER — OXYCODONE HCL 15 MG PO TABS: ORAL_TABLET | ORAL | 0 refills | Status: DC

## 2021-03-20 NOTE — Telephone Encounter (Signed)
Rondell left a message requesting a refill of Oxycodone

## 2021-03-23 ENCOUNTER — Telehealth: Payer: Self-pay | Admitting: *Deleted

## 2021-03-23 NOTE — Telephone Encounter (Signed)
Attempted to reach patient and his spouse about his missed MRI appointment that has resulted in [place] canceling his visit with Dr Mickeal Skinner next week.  Patient number and spouse phone numbers appear to be disconnected.  Left message on daughter phone to have return call to [place] to discuss.

## 2021-03-27 ENCOUNTER — Inpatient Hospital Stay: Payer: Medicaid Other

## 2021-03-28 ENCOUNTER — Telehealth: Payer: Self-pay | Admitting: *Deleted

## 2021-03-28 NOTE — Telephone Encounter (Signed)
Patient returned call and provided updated phone number.    Stressed the importance of having his MRI rescheduled.  He states he will call [facility] to schedule today because he has been having worsening balance issues and memory issues.   He requested refill of Decadron and Keppra as written on file to [facility].  Routing to MD to please refill.

## 2021-03-29 ENCOUNTER — Telehealth: Payer: Self-pay

## 2021-03-29 ENCOUNTER — Telehealth: Payer: Self-pay | Admitting: Internal Medicine

## 2021-03-29 NOTE — Telephone Encounter (Signed)
Sch per 1/17 inbasket, pt aware °

## 2021-03-29 NOTE — Telephone Encounter (Signed)
Patient called requesting refill on Keppra. Patient has been without for two days. Routing message sent to Dr. Mickeal Skinner .

## 2021-03-30 ENCOUNTER — Other Ambulatory Visit: Payer: Self-pay | Admitting: Internal Medicine

## 2021-03-30 ENCOUNTER — Inpatient Hospital Stay: Payer: Medicaid Other | Admitting: Internal Medicine

## 2021-03-30 MED ORDER — LEVETIRACETAM 1000 MG PO TABS: 2000.0000 mg | ORAL_TABLET | Freq: Two times a day (BID) | ORAL | 3 refills | Status: DC

## 2021-03-30 MED ORDER — DEXAMETHASONE 1 MG PO TABS: 1.0000 mg | ORAL_TABLET | Freq: Every day | ORAL | 2 refills | Status: DC

## 2021-04-05 ENCOUNTER — Other Ambulatory Visit: Payer: Self-pay

## 2021-04-05 ENCOUNTER — Ambulatory Visit
Admission: RE | Admit: 2021-04-05 | Discharge: 2021-04-05 | Disposition: A | Discharge status: Home / Self Care | Payer: Medicaid Other | Source: Ambulatory Visit | Attending: Internal Medicine | Admitting: Internal Medicine

## 2021-04-05 DIAGNOSIS — R930 Abnormal findings on diagnostic imaging of skull and head, not elsewhere classified: Secondary | ICD-10-CM | POA: Diagnosis not present

## 2021-04-05 DIAGNOSIS — C7931 Secondary malignant neoplasm of brain: Secondary | ICD-10-CM

## 2021-04-05 DIAGNOSIS — G936 Cerebral edema: Secondary | ICD-10-CM | POA: Diagnosis not present

## 2021-04-05 MED ORDER — GADOBENATE DIMEGLUMINE 529 MG/ML IV SOLN
15.0000 mL | Freq: Once | INTRAVENOUS | Status: AC | PRN
  Administered 2021-04-05: 15 mL via INTRAVENOUS

## 2021-04-07 ENCOUNTER — Telehealth: Payer: Self-pay | Admitting: *Deleted

## 2021-04-07 ENCOUNTER — Other Ambulatory Visit: Payer: Self-pay | Admitting: Oncology

## 2021-04-07 DIAGNOSIS — M79605 Pain in left leg: Secondary | ICD-10-CM

## 2021-04-07 DIAGNOSIS — C3490 Malignant neoplasm of unspecified part of unspecified bronchus or lung: Secondary | ICD-10-CM

## 2021-04-07 DIAGNOSIS — C763 Malignant neoplasm of pelvis: Secondary | ICD-10-CM

## 2021-04-07 DIAGNOSIS — M79604 Pain in right leg: Secondary | ICD-10-CM

## 2021-04-07 DIAGNOSIS — C7931 Secondary malignant neoplasm of brain: Secondary | ICD-10-CM

## 2021-04-07 MED ORDER — OXYCODONE HCL 15 MG PO TABS: ORAL_TABLET | ORAL | 0 refills | Status: DC

## 2021-04-07 NOTE — Telephone Encounter (Signed)
Eric Mason needs a refill of Oxycodone

## 2021-04-10 ENCOUNTER — Inpatient Hospital Stay: Payer: Medicaid Other | Attending: Internal Medicine

## 2021-04-10 ENCOUNTER — Inpatient Hospital Stay: Payer: Medicaid Other

## 2021-04-10 DIAGNOSIS — C7931 Secondary malignant neoplasm of brain: Secondary | ICD-10-CM | POA: Insufficient documentation

## 2021-04-10 DIAGNOSIS — Z801 Family history of malignant neoplasm of trachea, bronchus and lung: Secondary | ICD-10-CM | POA: Insufficient documentation

## 2021-04-10 DIAGNOSIS — F17210 Nicotine dependence, cigarettes, uncomplicated: Secondary | ICD-10-CM | POA: Insufficient documentation

## 2021-04-10 DIAGNOSIS — Z452 Encounter for adjustment and management of vascular access device: Secondary | ICD-10-CM | POA: Insufficient documentation

## 2021-04-10 DIAGNOSIS — Z7952 Long term (current) use of systemic steroids: Secondary | ICD-10-CM | POA: Insufficient documentation

## 2021-04-10 DIAGNOSIS — Z79899 Other long term (current) drug therapy: Secondary | ICD-10-CM | POA: Insufficient documentation

## 2021-04-11 ENCOUNTER — Other Ambulatory Visit: Payer: Self-pay

## 2021-04-11 ENCOUNTER — Inpatient Hospital Stay (HOSPITAL_BASED_OUTPATIENT_CLINIC_OR_DEPARTMENT_OTHER): Payer: Medicaid Other | Admitting: Internal Medicine

## 2021-04-11 ENCOUNTER — Inpatient Hospital Stay: Payer: Medicaid Other

## 2021-04-11 VITALS — BP 140/79 | HR 88 | Temp 97.6°F | Resp 18 | Ht 70.0 in | Wt 176.8 lb

## 2021-04-11 DIAGNOSIS — Z79899 Other long term (current) drug therapy: Secondary | ICD-10-CM | POA: Diagnosis not present

## 2021-04-11 DIAGNOSIS — C801 Malignant (primary) neoplasm, unspecified: Secondary | ICD-10-CM | POA: Diagnosis present

## 2021-04-11 DIAGNOSIS — Z95828 Presence of other vascular implants and grafts: Secondary | ICD-10-CM

## 2021-04-11 DIAGNOSIS — Z801 Family history of malignant neoplasm of trachea, bronchus and lung: Secondary | ICD-10-CM | POA: Diagnosis not present

## 2021-04-11 DIAGNOSIS — C7931 Secondary malignant neoplasm of brain: Secondary | ICD-10-CM | POA: Diagnosis not present

## 2021-04-11 DIAGNOSIS — Z7952 Long term (current) use of systemic steroids: Secondary | ICD-10-CM | POA: Diagnosis not present

## 2021-04-11 DIAGNOSIS — C7951 Secondary malignant neoplasm of bone: Secondary | ICD-10-CM | POA: Diagnosis not present

## 2021-04-11 DIAGNOSIS — Z452 Encounter for adjustment and management of vascular access device: Secondary | ICD-10-CM | POA: Diagnosis present

## 2021-04-11 DIAGNOSIS — F17210 Nicotine dependence, cigarettes, uncomplicated: Secondary | ICD-10-CM | POA: Diagnosis not present

## 2021-04-11 MED ORDER — SODIUM CHLORIDE 0.9% FLUSH
10.0000 mL | Freq: Once | INTRAVENOUS | Status: AC
  Administered 2021-04-11: 10 mL

## 2021-04-11 MED ORDER — HEPARIN SOD (PORK) LOCK FLUSH 100 UNIT/ML IV SOLN
500.0000 [IU] | Freq: Once | INTRAVENOUS | Status: AC
  Administered 2021-04-11: 500 [IU]
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
## 2021-04-12 ENCOUNTER — Telehealth: Payer: Self-pay | Admitting: Internal Medicine

## 2021-04-12 NOTE — Telephone Encounter (Signed)
Scheduled per 1/31 los, pt has been called and confirmed appt

## 2021-04-14 ENCOUNTER — Other Ambulatory Visit: Payer: Self-pay | Admitting: Radiation Therapy

## 2021-04-17 ENCOUNTER — Other Ambulatory Visit: Payer: Self-pay | Admitting: *Deleted

## 2021-04-17 DIAGNOSIS — Z95828 Presence of other vascular implants and grafts: Secondary | ICD-10-CM

## 2021-04-21 ENCOUNTER — Ambulatory Visit
Admission: RE | Admit: 2021-04-21 | Discharge: 2021-04-21 | Disposition: A | Discharge status: Home / Self Care | Payer: Medicaid Other | Source: Ambulatory Visit | Attending: Internal Medicine | Admitting: Internal Medicine

## 2021-04-21 DIAGNOSIS — G549 Nerve root and plexus disorder, unspecified: Secondary | ICD-10-CM | POA: Diagnosis not present

## 2021-04-21 DIAGNOSIS — M5126 Other intervertebral disc displacement, lumbar region: Secondary | ICD-10-CM | POA: Diagnosis not present

## 2021-04-21 DIAGNOSIS — Z95828 Presence of other vascular implants and grafts: Secondary | ICD-10-CM

## 2021-04-21 DIAGNOSIS — C3490 Malignant neoplasm of unspecified part of unspecified bronchus or lung: Secondary | ICD-10-CM | POA: Diagnosis not present

## 2021-04-21 DIAGNOSIS — C7951 Secondary malignant neoplasm of bone: Secondary | ICD-10-CM

## 2021-04-21 DIAGNOSIS — M4807 Spinal stenosis, lumbosacral region: Secondary | ICD-10-CM | POA: Diagnosis not present

## 2021-04-21 MED ORDER — HEPARIN SOD (PORK) LOCK FLUSH 100 UNIT/ML IV SOLN
500.0000 [IU] | Freq: Once | INTRAVENOUS | Status: AC
  Administered 2021-04-21: 500 [IU] via INTRAVENOUS

## 2021-04-21 MED ORDER — SODIUM CHLORIDE 0.9% FLUSH
10.0000 mL | INTRAVENOUS | Status: DC | PRN
  Administered 2021-04-21: 10 mL via INTRAVENOUS

## 2021-04-21 MED ORDER — GADOBENATE DIMEGLUMINE 529 MG/ML IV SOLN
15.0000 mL | Freq: Once | INTRAVENOUS | Status: AC | PRN
  Administered 2021-04-21: 15 mL via INTRAVENOUS

## 2021-04-24 ENCOUNTER — Inpatient Hospital Stay: Payer: Medicaid Other | Attending: Internal Medicine

## 2021-04-25 ENCOUNTER — Inpatient Hospital Stay (HOSPITAL_BASED_OUTPATIENT_CLINIC_OR_DEPARTMENT_OTHER): Payer: Medicaid Other | Admitting: Internal Medicine

## 2021-04-25 ENCOUNTER — Other Ambulatory Visit: Payer: Self-pay | Admitting: Oncology

## 2021-04-25 DIAGNOSIS — C7931 Secondary malignant neoplasm of brain: Secondary | ICD-10-CM | POA: Diagnosis not present

## 2021-04-25 DIAGNOSIS — C3490 Malignant neoplasm of unspecified part of unspecified bronchus or lung: Secondary | ICD-10-CM

## 2021-04-25 DIAGNOSIS — C763 Malignant neoplasm of pelvis: Secondary | ICD-10-CM

## 2021-04-25 DIAGNOSIS — M79604 Pain in right leg: Secondary | ICD-10-CM

## 2021-04-25 MED ORDER — OXYCODONE HCL 15 MG PO TABS: ORAL_TABLET | ORAL | 0 refills | Status: DC

## 2021-04-25 NOTE — Progress Notes (Signed)
I connected with Eric Mason on 04/25/21 at 11:00 AM EST by telephone visit and verified that I am speaking with the correct person using two identifiers.  I discussed the limitations, risks, security and privacy concerns of performing an evaluation and management service by telemedicine and the availability of in-person appointments. I also discussed with the patient that there may be a patient responsible charge related to this service. The patient expressed understanding and agreed to proceed.  Other persons participating in the visit and their role in the encounter:  n/a  Patient's location:  Home  Provider's location:  Office  Chief Complaint:  Brain metastasis ([place])  History of Present Ilness: Eric Mason describes no improvement in lower back pain.  Continues to experience radiation down his left leg as prior.  Still walking on his own, but with difficulty. Observations: Language and cognition at baseline  Imaging:  [facility] Clinician Interpretation: I have personally reviewed the CNS images as listed.  My interpretation, in the context of the patient's clinical presentation, is  spondolytic changes, tumor essentially stable  MR BRAIN W WO CONTRAST  Result Date: 04/07/2021 CLINICAL DATA:  Stereotactic radio surgery. Assessment of treatment response. EXAM: MRI HEAD WITHOUT AND WITH CONTRAST TECHNIQUE: Multiplanar, multiecho pulse sequences of the brain and surrounding structures were obtained without and with intravenous contrast. CONTRAST:  97mL MULTIHANCE GADOBENATE DIMEGLUMINE 529 MG/ML IV SOLN COMPARISON:  08/12/2020 FINDINGS: Brain: No acute infarct, mass effect or extra-axial collection. There are areas of chronic blood products in the posterior right parietal lobe and at the left insula. Areas of abnormal hyperintense T2-weighted signal within the left insula, left anterior temporal lobe, right posterior temporal lobe are unchanged. Right medial cerebellar edema is also  unchanged. 1. The medial right cerebellar lesion has increased in size slightly (series 11, image 50) and now measures 10 mm, previously 9 mm 2. Worsened enhancing nodularity at the right posterior temporal lesion (series 11 images 70 and 81) 3. Increased size of nodular enhancement at the superior anterior aspect of the lesion of the left insula (series 11, image 97). Vascular: Major flow voids are preserved. Skull and upper cervical spine: Remote right posterior craniotomy Sinuses/Orbits:No paranasal sinus fluid levels or advanced mucosal thickening. No mastoid or middle ear effusion. Normal orbits. IMPRESSION: 1. Increased size of nodular enhancement at the superior anterior aspect of the lesion of the left insula, and increase in size of medial right cerebellar lesion, now measuring 10 mm, previously 9 mm. 2. Unchanged hyperintense T2-weighted signal within the left insula, left anterior temporal lobe, right posterior temporal lobe and right medial cerebellar edema. Electronically Signed   By: Ulyses Jarred M.D.   On: 04/07/2021 01:29   MR LUMBAR SPINE W WO CONTRAST  Result Date: 04/22/2021 CLINICAL DATA:  Brain/CNS neoplasm, monitor. Metastatic lung cancer. Worsening low back and bilateral leg pain. History of SRS for treatment of an L5 nerve root lesion. EXAM: MRI LUMBAR SPINE WITHOUT AND WITH CONTRAST TECHNIQUE: Multiplanar and multiecho pulse sequences of the lumbar spine were obtained without and with intravenous contrast. CONTRAST:  46mL MULTIHANCE GADOBENATE DIMEGLUMINE 529 MG/ML IV SOLN Contrast was administered via a port which was accessed by a Equities trader. COMPARISON:  Lumbar spine MRI 08/06/2018. CT chest, abdomen, and pelvis 01/11/2021. FINDINGS: Segmentation:  Standard. Alignment:  Normal. Vertebrae: Chronic fatty marrow changes in the L5 vertebra and sacrum related to prior radiation therapy. No suspicious marrow lesion, fracture, or significant marrow edema in the lumbar spine. The  treated lesion  in the left iliac bone is less conspicuous than on the prior MRI. Conus medullaris and cauda equina: Conus extends to the L1-2 level. Conus and cauda equina appear normal. Paraspinal and other soft tissues: Unremarkable. Disc levels: L1-2: Negative. L2-3: Mild disc bulging, a new shallow right foraminal disc protrusion, and mild facet and ligamentum flavum hypertrophy result in new mild right neural foraminal stenosis without spinal stenosis. L3-4: Disc bulging slightly greater to the right and mild facet and ligamentum flavum hypertrophy result in mild right neural foraminal stenosis without spinal stenosis, unchanged. L4-5: Mild disc desiccation and slight disc space narrowing. Disc bulging, a new left paracentral disc protrusion, and moderate facet and ligamentum flavum hypertrophy result in increased, moderate to severe spinal stenosis, mild right and moderate to severe left lateral recess stenosis, and mild bilateral neural foraminal stenosis. Potential left L5 nerve root impingement. L5-S1: A heterogeneously T2 hyperintense, heterogeneously enhancing mass involving the left L5 nerve root within and lateral to the neural foramen has slightly enlarged since 2020, measuring approximately 10 mm in maximal diameter (previously 8-9 mm), with increased effacement of fat in the neural foramen. Disc bulging, a central disc protrusion, and mild facet and ligamentum flavum hypertrophy result in mild right greater than left lateral recess stenosis without spinal stenosis or right neural foraminal stenosis, unchanged. IMPRESSION: 1. Minimal enlargement of the left L5 nerve root mass. No evidence of new lumbar spine metastases. 2. New left paracentral disc protrusion at L4-5 with moderate to severe spinal stenosis. 3. New mild right neural foraminal stenosis at L2-3. Electronically Signed   By: Logan Bores M.D.   On: 04/22/2021 11:43    Assessment and Plan: Lumbar Spondylosis  Disc herniation noted with  intrusion into canal.  We suspect this is etiology of recent worsening of localized and radicular pain syndrome.  Follow Up Instructions:  Will refer back to Dr. Saintclair Halsted for surgical evaluation.  Patient is agreeable with this.  I discussed the assessment and treatment plan with the patient.  The patient was provided an opportunity to ask questions and all were answered.  The patient agreed with the plan and demonstrated understanding of the instructions.    The patient was advised to call back or seek an in-person evaluation if the symptoms worsen or if the condition fails to improve as anticipated.  I provided 5-10 minutes of non-face-to-face time during this enocunter.  Ventura Sellers, MD   I provided 15 minutes of non face-to-face telephone visit time during this encounter, and > 50% was spent counseling as documented under my assessment & plan.

## 2021-04-27 ENCOUNTER — Telehealth: Payer: Self-pay | Admitting: *Deleted

## 2021-04-27 NOTE — Telephone Encounter (Signed)
Routed office note from 04/25/2021 and MR Lumbar Spine from 04/21/2021 to Dr Driscilla Moats office for returning referral.

## 2021-04-28 NOTE — Telephone Encounter (Signed)
Spoke with pt and advised of referral to Dr Saintclair Halsted. He will wait for their call for the appt.

## 2021-05-11 ENCOUNTER — Other Ambulatory Visit: Payer: Self-pay | Admitting: Internal Medicine

## 2021-05-11 ENCOUNTER — Telehealth: Payer: Self-pay | Admitting: *Deleted

## 2021-05-11 MED ORDER — GABAPENTIN 800 MG PO TABS: 800.0000 mg | ORAL_TABLET | Freq: Three times a day (TID) | ORAL | 3 refills | Status: DC

## 2021-05-11 NOTE — Telephone Encounter (Signed)
Patient needs refill of Gabapentin 800 mg tablet qty 90.  Take 1 tablet by mouth three time daily. ? ?To [facility] ?

## 2021-05-12 ENCOUNTER — Other Ambulatory Visit: Payer: Self-pay | Admitting: Oncology

## 2021-05-12 DIAGNOSIS — M79604 Pain in right leg: Secondary | ICD-10-CM

## 2021-05-12 DIAGNOSIS — M79605 Pain in left leg: Secondary | ICD-10-CM

## 2021-05-12 DIAGNOSIS — C3490 Malignant neoplasm of unspecified part of unspecified bronchus or lung: Secondary | ICD-10-CM

## 2021-05-12 DIAGNOSIS — C763 Malignant neoplasm of pelvis: Secondary | ICD-10-CM

## 2021-05-12 DIAGNOSIS — C7931 Secondary malignant neoplasm of brain: Secondary | ICD-10-CM

## 2021-05-12 MED ORDER — OXYCODONE HCL 15 MG PO TABS: ORAL_TABLET | ORAL | 0 refills | Status: DC

## 2021-05-16 DIAGNOSIS — M5126 Other intervertebral disc displacement, lumbar region: Secondary | ICD-10-CM | POA: Diagnosis not present

## 2021-05-19 ENCOUNTER — Telehealth: Payer: Self-pay

## 2021-05-19 NOTE — Telephone Encounter (Signed)
Notified Patient of prior authorization approval for Oxycodone 15mg  Tablets. Medication is approved from 05/15/2021 through 11/15/2021. No other needs or concerns verbalized at this time. ?

## 2021-05-29 ENCOUNTER — Other Ambulatory Visit: Payer: Self-pay | Admitting: Oncology

## 2021-05-29 DIAGNOSIS — C7931 Secondary malignant neoplasm of brain: Secondary | ICD-10-CM

## 2021-05-29 DIAGNOSIS — C763 Malignant neoplasm of pelvis: Secondary | ICD-10-CM

## 2021-05-29 DIAGNOSIS — C3490 Malignant neoplasm of unspecified part of unspecified bronchus or lung: Secondary | ICD-10-CM

## 2021-05-29 DIAGNOSIS — M79604 Pain in right leg: Secondary | ICD-10-CM

## 2021-05-29 MED ORDER — OXYCODONE HCL 15 MG PO TABS: ORAL_TABLET | ORAL | 0 refills | Status: DC

## 2021-05-30 ENCOUNTER — Telehealth: Payer: Self-pay | Admitting: *Deleted

## 2021-05-30 ENCOUNTER — Other Ambulatory Visit: Payer: Self-pay | Admitting: Radiation Therapy

## 2021-05-30 NOTE — Telephone Encounter (Signed)
-----   Message from Wyatt Portela, MD sent at 05/30/2021 11:18 AM EDT ----- ?Regarding: RE: Pain medication ?No additional pain medication will be prescribed.  There will be no changes in the frequency or amount prescribed.  Thanks ?----- Message ----- ?From: Rolene Course, RN ?Sent: 05/30/2021  11:03 AM EDT ?To: Wyatt Portela, MD ?Subject: Pain medication                               ? ?[name] called, a rx for his oxycodone was sent in yesterday, however, his pharmacy cannot refill it until March 25.  He says he has extreme back pain & needs it refilled right away.  I told him you could possibly prescribe an additional pain med but he only wants the oxycodone.  The pharmacy said his dose or frequency would have to be changed for them to fill the rx early.  Please advise. ? ?Thanks, ?Bethena Roys ? ? ?

## 2021-05-30 NOTE — Telephone Encounter (Signed)
PC to patient, informed him of Dr. Hazeline Junker message below, suggested that patient contact the pain clinic about his situation.  Patient verbalizes understanding, states he is going to change all of his prescriptions to different pharmacy. ?

## 2021-06-01 ENCOUNTER — Telehealth: Payer: Self-pay | Admitting: *Deleted

## 2021-06-01 ENCOUNTER — Other Ambulatory Visit: Payer: Self-pay | Admitting: Oncology

## 2021-06-01 DIAGNOSIS — C3490 Malignant neoplasm of unspecified part of unspecified bronchus or lung: Secondary | ICD-10-CM

## 2021-06-01 DIAGNOSIS — C763 Malignant neoplasm of pelvis: Secondary | ICD-10-CM

## 2021-06-01 DIAGNOSIS — C7931 Secondary malignant neoplasm of brain: Secondary | ICD-10-CM

## 2021-06-01 DIAGNOSIS — M79604 Pain in right leg: Secondary | ICD-10-CM

## 2021-06-01 MED ORDER — OXYCODONE HCL 15 MG PO TABS: ORAL_TABLET | ORAL | 0 refills | Status: DC

## 2021-06-01 NOTE — Telephone Encounter (Signed)
Eric Mason needs his pain medication prescription sent to [facility] in [place]. Eric Mason cannot transfer prescription tot he new pharmacy.  ?

## 2021-06-02 ENCOUNTER — Encounter: Payer: Self-pay | Admitting: Oncology

## 2021-06-06 ENCOUNTER — Other Ambulatory Visit: Payer: Self-pay | Admitting: *Deleted

## 2021-06-06 DIAGNOSIS — G893 Neoplasm related pain (acute) (chronic): Secondary | ICD-10-CM

## 2021-06-06 DIAGNOSIS — M5416 Radiculopathy, lumbar region: Secondary | ICD-10-CM | POA: Diagnosis not present

## 2021-06-06 DIAGNOSIS — R112 Nausea with vomiting, unspecified: Secondary | ICD-10-CM

## 2021-06-06 MED ORDER — DULOXETINE HCL 30 MG PO CPEP: 30.0000 mg | ORAL_CAPSULE | Freq: Every day | ORAL | 5 refills | Status: DC

## 2021-06-06 MED ORDER — ALBUTEROL SULFATE HFA 108 (90 BASE) MCG/ACT IN AERS: INHALATION_SPRAY | RESPIRATORY_TRACT | 6 refills | Status: AC

## 2021-06-06 MED ORDER — DICLOFENAC SODIUM 1 % EX GEL: 2.0000 g | Freq: Four times a day (QID) | CUTANEOUS | 1 refills | Status: DC

## 2021-06-06 MED ORDER — PANTOPRAZOLE SODIUM 40 MG PO TBEC: DELAYED_RELEASE_TABLET | ORAL | 5 refills | Status: DC

## 2021-06-06 NOTE — Telephone Encounter (Signed)
Eric Mason left a message request Dr Alen Blew send all of his prescriptions over to [facility]. Proair, Protonix, Voltaren gel and Cymbalta prescriptions sent.  ?

## 2021-06-08 ENCOUNTER — Telehealth: Payer: Self-pay | Admitting: Oncology

## 2021-06-08 NOTE — Telephone Encounter (Signed)
Called patient regarding upcoming appointments, patient is notified. 

## 2021-06-09 ENCOUNTER — Other Ambulatory Visit: Payer: Self-pay

## 2021-06-09 ENCOUNTER — Inpatient Hospital Stay: Payer: Medicaid Other | Attending: Internal Medicine | Admitting: Oncology

## 2021-06-09 ENCOUNTER — Inpatient Hospital Stay: Payer: Medicaid Other

## 2021-06-09 ENCOUNTER — Encounter: Payer: Self-pay | Admitting: Oncology

## 2021-06-09 VITALS — BP 95/65 | HR 74 | Temp 97.9°F | Resp 19 | Ht 70.0 in | Wt 177.4 lb

## 2021-06-09 DIAGNOSIS — Z79899 Other long term (current) drug therapy: Secondary | ICD-10-CM | POA: Insufficient documentation

## 2021-06-09 DIAGNOSIS — M5126 Other intervertebral disc displacement, lumbar region: Secondary | ICD-10-CM | POA: Diagnosis not present

## 2021-06-09 DIAGNOSIS — Z923 Personal history of irradiation: Secondary | ICD-10-CM | POA: Diagnosis not present

## 2021-06-09 DIAGNOSIS — C3490 Malignant neoplasm of unspecified part of unspecified bronchus or lung: Secondary | ICD-10-CM | POA: Diagnosis not present

## 2021-06-09 DIAGNOSIS — I700 Atherosclerosis of aorta: Secondary | ICD-10-CM | POA: Insufficient documentation

## 2021-06-09 DIAGNOSIS — J439 Emphysema, unspecified: Secondary | ICD-10-CM | POA: Diagnosis not present

## 2021-06-09 DIAGNOSIS — C7951 Secondary malignant neoplasm of bone: Secondary | ICD-10-CM | POA: Diagnosis not present

## 2021-06-09 DIAGNOSIS — C7931 Secondary malignant neoplasm of brain: Secondary | ICD-10-CM | POA: Diagnosis not present

## 2021-06-09 NOTE — Progress Notes (Signed)
Hematology and Oncology Follow Up  ? ?Eric Mason ?[id number] ?September 25, 1970 51 y.o. ?06/09/2021 1:24 PM ?Eric Mason, [name], [name], MD  ? ? ? ? ? 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  . ? ? ?Prior Therapy: ? ?Status post craniotomy and resection in May 2018. This was followed by [facility] completed in June 2018. ?  ?He is status post a stereotactic radiosurgery in January 2019 after developing 3 intracranial metastasis in the left frontal insula without any significant mass-effect. ?  ?Carboplatin and paclitaxel with Pembrolizumab cycle 1 given on 06/18/2017.  He completed 4 cycles of carboplatin and paclitaxel and completed 6 cycles of Pembrolizumab total. ?  ?He is status post repeat radiation to L5 presumed tumor ablated in June 2020. ?  ?Current therapy: Active surveillance. ? ? ?Interim History: Mr. Helminiak is here for a follow-up visit.  Since the last visit, he reports no major changes in his health.  He continues to have issues with radicular back pain although not associated with any neurological deficits.  He denies any headaches, blurry vision or syncope.  He denies any seizure activity.  He was evaluated by Dr. Saintclair Halsted and scheduled to have epidural injection.  He did report occasional dyspnea on exertion although no cough, wheezing or hemoptysis. ? ? ? ?Medications: Reviewed without changes. ?Current Outpatient Medications  ?Medication Sig Dispense Refill  ? albuterol (PROAIR HFA) 108 (90 Base) MCG/ACT inhaler Inhale 2 puffs into the lungs every 6 (six) hours as needed for wheezing or shortness of breath.(200/8=25) 8.5 g 6  ? amitriptyline (ELAVIL) 75 MG tablet Take 1 tablet (75 mg total) by mouth at bedtime. 30 tablet 3  ? B Complex-C (B-COMPLEX WITH VITAMIN C) tablet Take 1 tablet by mouth daily.     ? baclofen (LIORESAL) 10 MG tablet Take 1 tablet (10 mg total) by mouth 3 (three) times  daily. 90 each 5  ? bisacodyl (DULCOLAX) 5 MG EC tablet Take 2 tablets (10 mg total) by mouth daily as needed for moderate constipation. (Patient not taking: Reported on 04/11/2021) 30 tablet 0  ? busPIRone (BUSPAR) 10 MG tablet Take 1 tablet (10 mg total) by mouth 2 (two) times daily. (Patient not taking: Reported on 04/11/2021) 60 tablet 6  ? Cholecalciferol (VITAMIN D) 50 MCG (2000 UT) CAPS Take 2,000 Units by mouth daily.    ? dexamethasone (DECADRON) 1 MG tablet Take 1 tablet (1 mg total) by mouth daily. 60 tablet 2  ? diclofenac Sodium (VOLTAREN) 1 % GEL Apply 2 g topically 4 (four) times daily. 50 g 1  ? DULoxetine (CYMBALTA) 30 MG capsule Take 1 capsule (30 mg total) by mouth daily. 30 capsule 5  ? folic acid (FOLVITE) 1 MG tablet Take 1 tablet (1 mg total) by mouth daily. 100 tablet 1  ? gabapentin (NEURONTIN) 800 MG tablet Take 1 tablet (800 mg total) by mouth 3 (three) times daily. 90 tablet 3  ? levETIRAcetam (KEPPRA) 1000 MG tablet Take 2 tablets (2,000 mg total) by mouth 2 (two) times daily. 120 tablet 3  ? Multiple Vitamin (MULTIVITAMIN) tablet Take 1 tablet by mouth daily. 30 tablet 0  ? NARCAN 4 MG/0.1ML LIQD nasal spray kit See admin instructions.    ? nicotine (NICODERM CQ - DOSED IN MG/24 HOURS) 21 mg/24hr patch Place 1 patch (21 mg total) onto the skin daily. 28 patch 0  ? oxyCODONE (ROXICODONE) 15 MG immediate release  tablet Take Every 4 hours as needed 126 tablet 0  ? pantoprazole (PROTONIX) 40 MG tablet Take 1 tablet(s) by mouth TWO TIMES DAILY 60 tablet 5  ? polyethylene glycol (MIRALAX / GLYCOLAX) 17 g packet Take 17 g by mouth daily as needed for moderate constipation.    ? promethazine (PHENERGAN) 25 MG tablet Take 1 tablet (25 mg total) by mouth every 6 (six) hours as needed for nausea or vomiting. 30 tablet 0  ? senna-docusate (SENOKOT-S) 8.6-50 MG tablet Take 2 tablets by mouth 2 (two) times daily. 30 tablet 1  ? thiamine (VITAMIN B-1) 100 MG tablet Take 1 tablet (100 mg total) by mouth  daily. 100 tablet 0  ? ?No current facility-administered medications for this visit.  ? ?Facility-Administered Medications Ordered in Other Visits  ?Medication Dose Route Frequency Provider Last Rate Last Admin  ? sodium chloride flush (NS) 0.9 % injection 10 mL  10 mL Intracatheter PRN Wyatt Portela, MD   10 mL at 06/18/17 1815  ? ? ? ?Allergies:  ?Allergies  ?Allergen Reactions  ? Latex Itching and Rash  ? ? ? ?Physical examination: ? ? ? ?Blood pressure 95/65, pulse 74, temperature 97.9 ?F (36.6 ?C), temperature source Temporal, resp. rate 19, height 5' 10" (1.778 m), weight 177 lb 6.4 oz (80.5 kg), SpO2 97 %. ? ? ? ? ?ECOG 1 ? ? ? ?General appearance: Comfortable appearing without any discomfort ?Head: Normocephalic without any trauma ?Oropharynx: Mucous membranes are moist and pink without any thrush or ulcers. ?Eyes: Pupils are equal and round reactive to light. ?Lymph nodes: No cervical, supraclavicular, inguinal or axillary lymphadenopathy.   ?Heart:regular rate and rhythm.  S1 and S2 without leg edema. ?Lung: Clear without any rhonchi or wheezes.  No dullness to percussion. ?Abdomin: Soft, nontender, nondistended with good bowel sounds.  No hepatosplenomegaly. ?Musculoskeletal: No joint deformity or effusion.  Full range of motion noted. ?Neurological: No deficits noted on motor, sensory and deep tendon reflex exam. ?Skin: No petechial rash or dryness.  Appeared moist.  ? ? ? ? ? ? ? ? ? ? ? ?Lab Results: ?Lab Results  ?Component Value Date  ? WBC 8.9 12/09/2020  ? HGB 10.0 (L) 12/09/2020  ? HCT 31.6 (L) 12/09/2020  ? MCV 77.3 (L) 12/09/2020  ? PLT 305 12/09/2020  ? ?  Chemistry   ?   ?Component Value Date/Time  ? NA 139 12/09/2020 0945  ? NA 140 10/27/2020 1521  ? NA 137 10/30/2016 0909  ? K 3.9 12/09/2020 0945  ? K 3.3 (L) 10/30/2016 4401  ? CL 102 12/09/2020 0945  ? CO2 26 12/09/2020 0945  ? CO2 27 10/30/2016 0909  ? BUN 11 12/09/2020 0945  ? BUN 9 10/27/2020 1521  ? BUN 4.8 (L) 10/30/2016 0272  ?  CREATININE 0.88 12/09/2020 0945  ? CREATININE 0.9 10/30/2016 0909  ?    ?Component Value Date/Time  ? CALCIUM 9.3 12/09/2020 0945  ? CALCIUM 9.3 10/30/2016 0909  ? ALKPHOS 99 12/09/2020 0945  ? ALKPHOS 93 10/30/2016 0909  ? AST 17 12/09/2020 0945  ? AST 35 (H) 10/30/2016 5366  ? ALT 12 12/09/2020 0945  ? ALT 25 10/30/2016 0909  ? BILITOT 0.3 12/09/2020 0945  ? BILITOT 0.41 10/30/2016 0909  ?  ? ? ?IMPRESSION: ?No findings suspicious for recurrent or metastatic disease. ?  ?Small mediastinal lymph nodes, unchanged. ?  ?Small bilateral pulmonary nodules, unchanged, benign. ?  ?Aortic Atherosclerosis (ICD10-I70.0) and Emphysema (ICD10-J43.9). ? ?Impression and  Plan: ? ?51 year old man with: ?  ?1.    Advanced malignancy diagnosed in 2018.  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  ?  ?He continues to be on active surveillance without any evidence of systemic relapse disease.  CT scan obtained in November 2022 was reviewed and showed no evidence of relapsed disease.  The plan is to update staging scan in the near future. ?   ?2.  CNS metastasis: Brain MRI obtained on April 05, 2021 showed suspicion of disease progression and will have repeat MRI of the brain in the near future to follow labs. His lumbar spine MRI showed disc herniation and intrusion into the canal and was referred to Dr. Saintclair Halsted by Dr. Mickeal Skinner. ? ? ? ?3.  Port-A-Cath management: We will continue to periodically flushes Port-A-Cath. ?  ?4.  Pain: In the spine related to metastatic malignancy.  He is currently on oxycodone in addition to neuropathic pain regimen including Keppra and Neurontin. ? ?  ?5. Follow-up:  In 6 months for repeat evaluation. ?  ?30  minutes were dedicated to this visit.  Time spent on reviewing laboratory data, disease status update and outlining future plan of care review. ?  ? ?Zola Button, MD 06/09/2021 1:24 PM ?

## 2021-06-11 ENCOUNTER — Encounter: Payer: Self-pay | Admitting: Oncology

## 2021-06-16 ENCOUNTER — Other Ambulatory Visit: Payer: Self-pay | Admitting: Oncology

## 2021-06-16 ENCOUNTER — Telehealth: Payer: Self-pay | Admitting: *Deleted

## 2021-06-16 DIAGNOSIS — M79605 Pain in left leg: Secondary | ICD-10-CM

## 2021-06-16 DIAGNOSIS — C7931 Secondary malignant neoplasm of brain: Secondary | ICD-10-CM

## 2021-06-16 DIAGNOSIS — C763 Malignant neoplasm of pelvis: Secondary | ICD-10-CM

## 2021-06-16 DIAGNOSIS — C3490 Malignant neoplasm of unspecified part of unspecified bronchus or lung: Secondary | ICD-10-CM

## 2021-06-16 MED ORDER — OXYCODONE HCL 15 MG PO TABS: ORAL_TABLET | ORAL | 0 refills | Status: DC

## 2021-06-16 NOTE — Telephone Encounter (Signed)
Notified that it is too early. Eric Mason states he has enough to last until Monday ?

## 2021-06-16 NOTE — Telephone Encounter (Signed)
Gerald Stabs called for a refill of Oxycodone ?

## 2021-06-19 ENCOUNTER — Telehealth: Payer: Self-pay | Admitting: *Deleted

## 2021-06-19 ENCOUNTER — Other Ambulatory Visit: Payer: Self-pay | Admitting: Oncology

## 2021-06-19 DIAGNOSIS — C7931 Secondary malignant neoplasm of brain: Secondary | ICD-10-CM

## 2021-06-19 DIAGNOSIS — C3490 Malignant neoplasm of unspecified part of unspecified bronchus or lung: Secondary | ICD-10-CM

## 2021-06-19 DIAGNOSIS — C763 Malignant neoplasm of pelvis: Secondary | ICD-10-CM

## 2021-06-19 DIAGNOSIS — M79605 Pain in left leg: Secondary | ICD-10-CM

## 2021-06-19 NOTE — Telephone Encounter (Signed)
Eric Mason called for a refill of Oxycodone. States he will run out this afternoon ?

## 2021-06-23 ENCOUNTER — Telehealth: Payer: Self-pay

## 2021-06-23 ENCOUNTER — Inpatient Hospital Stay: Payer: Medicaid Other | Attending: Internal Medicine

## 2021-06-23 NOTE — Telephone Encounter (Signed)
error 

## 2021-06-23 NOTE — Telephone Encounter (Signed)
Patient was a no show for lab/flush apt today. Attempted to call both pt and his wife to reschedule. No answer and VM not set up. Unable to contact pt. Patient does not have my chart set up.  ?

## 2021-07-03 ENCOUNTER — Ambulatory Visit (HOSPITAL_COMMUNITY): Admission: RE | Admit: 2021-07-03 | Payer: Medicaid Other | Source: Ambulatory Visit

## 2021-07-05 ENCOUNTER — Other Ambulatory Visit: Payer: Self-pay | Admitting: *Deleted

## 2021-07-05 DIAGNOSIS — M79604 Pain in right leg: Secondary | ICD-10-CM

## 2021-07-05 DIAGNOSIS — C3490 Malignant neoplasm of unspecified part of unspecified bronchus or lung: Secondary | ICD-10-CM

## 2021-07-05 DIAGNOSIS — C763 Malignant neoplasm of pelvis: Secondary | ICD-10-CM

## 2021-07-05 DIAGNOSIS — C7931 Secondary malignant neoplasm of brain: Secondary | ICD-10-CM

## 2021-07-05 MED ORDER — OXYCODONE HCL 15 MG PO TABS: ORAL_TABLET | ORAL | 0 refills | Status: DC

## 2021-07-07 ENCOUNTER — Ambulatory Visit
Admission: RE | Admit: 2021-07-07 | Discharge: 2021-07-07 | Disposition: A | Discharge status: Home / Self Care | Payer: Medicaid Other | Source: Ambulatory Visit | Attending: Internal Medicine | Admitting: Internal Medicine

## 2021-07-07 DIAGNOSIS — R220 Localized swelling, mass and lump, head: Secondary | ICD-10-CM | POA: Diagnosis not present

## 2021-07-07 DIAGNOSIS — C7931 Secondary malignant neoplasm of brain: Secondary | ICD-10-CM | POA: Diagnosis not present

## 2021-07-07 DIAGNOSIS — R930 Abnormal findings on diagnostic imaging of skull and head, not elsewhere classified: Secondary | ICD-10-CM | POA: Diagnosis not present

## 2021-07-07 DIAGNOSIS — C3490 Malignant neoplasm of unspecified part of unspecified bronchus or lung: Secondary | ICD-10-CM | POA: Diagnosis not present

## 2021-07-07 MED ORDER — GADOBENATE DIMEGLUMINE 529 MG/ML IV SOLN
15.0000 mL | Freq: Once | INTRAVENOUS | Status: AC | PRN
  Administered 2021-07-07: 15 mL via INTRAVENOUS

## 2021-07-20 ENCOUNTER — Telehealth: Payer: Self-pay | Admitting: *Deleted

## 2021-07-20 ENCOUNTER — Other Ambulatory Visit: Payer: Self-pay | Admitting: *Deleted

## 2021-07-20 NOTE — Telephone Encounter (Signed)
Patient called to report increasing dizziness and shocking sensation in the head.  He states that his medications haven't changed except for Dr Saintclair Halsted adding mobic.  Patient reports dizziness is worse with ambulation. ? ?Patient requesting follow up with Dr Mickeal Skinner. ?

## 2021-07-25 ENCOUNTER — Other Ambulatory Visit: Payer: Self-pay

## 2021-07-25 ENCOUNTER — Inpatient Hospital Stay: Payer: Medicaid Other | Attending: Internal Medicine | Admitting: Internal Medicine

## 2021-07-25 ENCOUNTER — Telehealth: Payer: Self-pay | Admitting: Internal Medicine

## 2021-07-25 VITALS — BP 89/58 | HR 81 | Temp 97.6°F | Resp 16 | Ht 70.0 in | Wt 167.6 lb

## 2021-07-25 DIAGNOSIS — Z79899 Other long term (current) drug therapy: Secondary | ICD-10-CM | POA: Diagnosis not present

## 2021-07-25 DIAGNOSIS — M62838 Other muscle spasm: Secondary | ICD-10-CM | POA: Diagnosis not present

## 2021-07-25 DIAGNOSIS — C7931 Secondary malignant neoplasm of brain: Secondary | ICD-10-CM | POA: Diagnosis not present

## 2021-07-25 DIAGNOSIS — Z7952 Long term (current) use of systemic steroids: Secondary | ICD-10-CM | POA: Diagnosis not present

## 2021-07-25 DIAGNOSIS — C3490 Malignant neoplasm of unspecified part of unspecified bronchus or lung: Secondary | ICD-10-CM | POA: Diagnosis not present

## 2021-07-25 DIAGNOSIS — C7951 Secondary malignant neoplasm of bone: Secondary | ICD-10-CM | POA: Insufficient documentation

## 2021-07-25 DIAGNOSIS — G629 Polyneuropathy, unspecified: Secondary | ICD-10-CM | POA: Diagnosis not present

## 2021-07-25 DIAGNOSIS — R42 Dizziness and giddiness: Secondary | ICD-10-CM | POA: Insufficient documentation

## 2021-07-25 MED ORDER — AMITRIPTYLINE HCL 75 MG PO TABS: 75.0000 mg | ORAL_TABLET | Freq: Every day | ORAL | 2 refills | Status: DC
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
## 2021-07-25 NOTE — Telephone Encounter (Signed)
Per 5/16 los called pt twice and the number was not a working number  Mailing pt a calender with appointments  ?

## 2021-07-28 ENCOUNTER — Other Ambulatory Visit: Payer: Self-pay

## 2021-07-28 ENCOUNTER — Telehealth: Payer: Self-pay

## 2021-07-28 MED ORDER — LEVETIRACETAM 1000 MG PO TABS: 2000.0000 mg | ORAL_TABLET | Freq: Two times a day (BID) | ORAL | 3 refills | Status: DC

## 2021-07-28 NOTE — Telephone Encounter (Signed)
Patient called and LM requesting refill on Keppra.  Refill authorization sent electronically to pharmacy.

## 2021-07-31 ENCOUNTER — Other Ambulatory Visit: Payer: Self-pay | Admitting: *Deleted

## 2021-07-31 DIAGNOSIS — C7931 Secondary malignant neoplasm of brain: Secondary | ICD-10-CM

## 2021-07-31 DIAGNOSIS — C3490 Malignant neoplasm of unspecified part of unspecified bronchus or lung: Secondary | ICD-10-CM

## 2021-07-31 DIAGNOSIS — M79605 Pain in left leg: Secondary | ICD-10-CM

## 2021-07-31 DIAGNOSIS — C763 Malignant neoplasm of pelvis: Secondary | ICD-10-CM

## 2021-08-01 ENCOUNTER — Encounter: Payer: Self-pay | Admitting: Oncology

## 2021-08-01 ENCOUNTER — Other Ambulatory Visit: Payer: Self-pay | Admitting: *Deleted

## 2021-08-01 MED ORDER — OXYCODONE HCL 15 MG PO TABS: ORAL_TABLET | ORAL | 0 refills | Status: DC

## 2021-08-04 ENCOUNTER — Encounter: Payer: Self-pay | Admitting: Oncology

## 2021-08-11 ENCOUNTER — Inpatient Hospital Stay: Payer: Medicaid Other | Attending: Internal Medicine

## 2021-08-11 ENCOUNTER — Other Ambulatory Visit: Payer: Self-pay

## 2021-08-11 DIAGNOSIS — C3490 Malignant neoplasm of unspecified part of unspecified bronchus or lung: Secondary | ICD-10-CM | POA: Diagnosis present

## 2021-08-11 DIAGNOSIS — Z452 Encounter for adjustment and management of vascular access device: Secondary | ICD-10-CM | POA: Insufficient documentation

## 2021-08-11 DIAGNOSIS — Z95828 Presence of other vascular implants and grafts: Secondary | ICD-10-CM

## 2021-08-11 DIAGNOSIS — C7931 Secondary malignant neoplasm of brain: Secondary | ICD-10-CM | POA: Diagnosis present

## 2021-08-11 MED ORDER — SODIUM CHLORIDE 0.9% FLUSH
10.0000 mL | Freq: Once | INTRAVENOUS | Status: AC
  Administered 2021-08-11: 10 mL

## 2021-08-12 ENCOUNTER — Other Ambulatory Visit: Payer: Self-pay | Admitting: Nurse Practitioner

## 2021-08-14 ENCOUNTER — Inpatient Hospital Stay: Payer: Medicaid Other

## 2021-08-14 ENCOUNTER — Ambulatory Visit: Payer: Medicaid Other | Admitting: Internal Medicine

## 2021-08-16 ENCOUNTER — Other Ambulatory Visit: Payer: Self-pay

## 2021-08-16 DIAGNOSIS — C7931 Secondary malignant neoplasm of brain: Secondary | ICD-10-CM

## 2021-08-16 MED ORDER — BACLOFEN 10 MG PO TABS: 10.0000 mg | ORAL_TABLET | Freq: Three times a day (TID) | ORAL | 5 refills | Status: DC

## 2021-08-18 ENCOUNTER — Other Ambulatory Visit: Payer: Self-pay | Admitting: Oncology

## 2021-08-18 ENCOUNTER — Telehealth: Payer: Self-pay

## 2021-08-18 DIAGNOSIS — C763 Malignant neoplasm of pelvis: Secondary | ICD-10-CM

## 2021-08-18 DIAGNOSIS — C7931 Secondary malignant neoplasm of brain: Secondary | ICD-10-CM

## 2021-08-18 DIAGNOSIS — M79604 Pain in right leg: Secondary | ICD-10-CM

## 2021-08-18 DIAGNOSIS — C3490 Malignant neoplasm of unspecified part of unspecified bronchus or lung: Secondary | ICD-10-CM

## 2021-08-18 MED ORDER — OXYCODONE HCL 15 MG PO TABS: ORAL_TABLET | ORAL | 0 refills | Status: DC

## 2021-08-18 NOTE — Telephone Encounter (Signed)
Pt called requesting refill on Oxycodone. Patient states refill is due on 08/21/21.

## 2021-08-24 ENCOUNTER — Other Ambulatory Visit: Payer: Self-pay | Admitting: Radiation Therapy

## 2021-08-30 DIAGNOSIS — M5416 Radiculopathy, lumbar region: Secondary | ICD-10-CM | POA: Diagnosis not present

## 2021-09-07 ENCOUNTER — Other Ambulatory Visit: Payer: Self-pay | Admitting: *Deleted

## 2021-09-07 DIAGNOSIS — C763 Malignant neoplasm of pelvis: Secondary | ICD-10-CM

## 2021-09-07 DIAGNOSIS — C7931 Secondary malignant neoplasm of brain: Secondary | ICD-10-CM

## 2021-09-07 DIAGNOSIS — M79605 Pain in left leg: Secondary | ICD-10-CM

## 2021-09-07 DIAGNOSIS — C3490 Malignant neoplasm of unspecified part of unspecified bronchus or lung: Secondary | ICD-10-CM

## 2021-09-07 MED ORDER — OXYCODONE HCL 15 MG PO TABS: ORAL_TABLET | ORAL | 0 refills | Status: DC

## 2021-09-08 ENCOUNTER — Other Ambulatory Visit: Payer: Self-pay | Admitting: Internal Medicine

## 2021-09-30 ENCOUNTER — Other Ambulatory Visit: Payer: Self-pay | Admitting: Internal Medicine

## 2021-09-30 DIAGNOSIS — C763 Malignant neoplasm of pelvis: Secondary | ICD-10-CM

## 2021-09-30 DIAGNOSIS — C7931 Secondary malignant neoplasm of brain: Secondary | ICD-10-CM

## 2021-09-30 DIAGNOSIS — C3490 Malignant neoplasm of unspecified part of unspecified bronchus or lung: Secondary | ICD-10-CM

## 2021-09-30 DIAGNOSIS — M79604 Pain in right leg: Secondary | ICD-10-CM

## 2021-09-30 MED ORDER — OXYCODONE HCL 15 MG PO TABS: ORAL_TABLET | ORAL | 0 refills | Status: DC

## 2021-10-02 ENCOUNTER — Telehealth: Payer: Self-pay | Admitting: *Deleted

## 2021-10-02 NOTE — Telephone Encounter (Signed)
Returned PC to patient, he is upset that he only received 40 oxycodone with his last refill.  Explained to patient Dr. Alen Blew is out of the office until August 1, the MD covering for him prescribed enough pain medication for him until  Dr. Alen Blew returns.  Patient states this will not be enough medication.  Instructed patient to contact this office when he is out of medication & the MD covering Dr Alen Blew will reevaluate his rx.  Patient verbalizes understanding.

## 2021-10-02 NOTE — Telephone Encounter (Signed)
Returned PC to patient, he is upset that he only received 40 oxy

## 2021-10-05 ENCOUNTER — Other Ambulatory Visit: Payer: Self-pay | Admitting: Internal Medicine

## 2021-10-05 DIAGNOSIS — C7931 Secondary malignant neoplasm of brain: Secondary | ICD-10-CM

## 2021-10-05 DIAGNOSIS — C763 Malignant neoplasm of pelvis: Secondary | ICD-10-CM

## 2021-10-05 DIAGNOSIS — C3490 Malignant neoplasm of unspecified part of unspecified bronchus or lung: Secondary | ICD-10-CM

## 2021-10-05 DIAGNOSIS — M79605 Pain in left leg: Secondary | ICD-10-CM

## 2021-10-06 ENCOUNTER — Other Ambulatory Visit: Payer: Self-pay | Admitting: Physician Assistant

## 2021-10-06 ENCOUNTER — Other Ambulatory Visit: Payer: Self-pay | Admitting: Internal Medicine

## 2021-10-06 ENCOUNTER — Telehealth: Payer: Self-pay | Admitting: *Deleted

## 2021-10-06 DIAGNOSIS — M79604 Pain in right leg: Secondary | ICD-10-CM

## 2021-10-06 DIAGNOSIS — C763 Malignant neoplasm of pelvis: Secondary | ICD-10-CM

## 2021-10-06 DIAGNOSIS — C3490 Malignant neoplasm of unspecified part of unspecified bronchus or lung: Secondary | ICD-10-CM

## 2021-10-06 DIAGNOSIS — C7931 Secondary malignant neoplasm of brain: Secondary | ICD-10-CM

## 2021-10-06 MED ORDER — OXYCODONE HCL 15 MG PO TABS: ORAL_TABLET | ORAL | 0 refills | Status: DC

## 2021-10-06 NOTE — Telephone Encounter (Signed)
Eric Mason called requesting the remaining refill of his Oxycodone.

## 2021-10-09 ENCOUNTER — Other Ambulatory Visit: Payer: Self-pay | Admitting: Physician Assistant

## 2021-10-09 ENCOUNTER — Encounter: Payer: Self-pay | Admitting: Oncology

## 2021-10-12 ENCOUNTER — Other Ambulatory Visit: Payer: Self-pay

## 2021-10-12 ENCOUNTER — Inpatient Hospital Stay: Payer: Medicaid Other | Attending: Internal Medicine

## 2021-10-12 DIAGNOSIS — Z452 Encounter for adjustment and management of vascular access device: Secondary | ICD-10-CM | POA: Insufficient documentation

## 2021-10-12 DIAGNOSIS — C7931 Secondary malignant neoplasm of brain: Secondary | ICD-10-CM | POA: Diagnosis present

## 2021-10-12 DIAGNOSIS — C3490 Malignant neoplasm of unspecified part of unspecified bronchus or lung: Secondary | ICD-10-CM | POA: Diagnosis present

## 2021-10-12 DIAGNOSIS — Z95828 Presence of other vascular implants and grafts: Secondary | ICD-10-CM

## 2021-10-12 MED ORDER — SODIUM CHLORIDE 0.9% FLUSH
10.0000 mL | Freq: Once | INTRAVENOUS | Status: AC
  Administered 2021-10-12: 10 mL

## 2021-10-12 MED ORDER — HEPARIN SOD (PORK) LOCK FLUSH 100 UNIT/ML IV SOLN
500.0000 [IU] | Freq: Once | INTRAVENOUS | Status: AC
  Administered 2021-10-12: 500 [IU]

## 2021-10-23 ENCOUNTER — Ambulatory Visit: Payer: Self-pay

## 2021-10-23 NOTE — Telephone Encounter (Signed)
     Chief Complaint: Pain, swelling to both legs. Thighs and lower legs. Being treated for cancer currently. Symptoms: Pain, swelling Frequency: 1 year ago Pertinent Negatives: Patient denies  Disposition: [] ED /[] Urgent Care (no appt availability in office) / [] Appointment(In office/virtual)/ []  [facility]/ [] Home Care/ [] Refused Recommended Disposition /[] [facility]/ [x]  Follow-up with PCP Additional Notes: Pt. Asking to be seen next week. Please advise pt.  Answer Assessment - Initial Assessment Questions 1. ONSET: "When did the swelling start?" (e.g., minutes, hours, days)     I year ago 2. LOCATION: "What part of the leg is swollen?"  "Are both legs swollen or just one leg?"     Both 3. SEVERITY: "How bad is the swelling?" (e.g., localized; mild, moderate, severe)   - Localized: Small area of swelling localized to one leg.   - MILD pedal edema: Swelling limited to foot and ankle, pitting edema < 1/4 inch (6 mm) deep, rest and elevation eliminate most or all swelling.   - MODERATE edema: Swelling of lower leg to knee, pitting edema > 1/4 inch (6 mm) deep, rest and elevation only partially reduce swelling.   - SEVERE edema: Swelling extends above knee, facial or hand swelling present.      Thighs and lower legs 4. REDNESS: "Does the swelling look red or infected?"     No 5. PAIN: "Is the swelling painful to touch?" If Yes, ask: "How painful is it?"   (Scale 1-10; mild, moderate or severe)     8 6. FEVER: "Do you have a fever?" If Yes, ask: "What is it, how was it measured, and when did it start?"      No 7. CAUSE: "What do you think is causing the leg swelling?"     Unsure 8. MEDICAL HISTORY: "Do you have a history of blood clots (e.g., DVT), cancer, heart failure, kidney disease, or liver failure?"     Cancer 9. RECURRENT SYMPTOM: "Have you had leg swelling before?" If Yes, ask: "When was the last time?" "What happened that time?"     Yes 10. OTHER  SYMPTOMS: "Do you have any other symptoms?" (e.g., chest pain, difficulty breathing)       No 11. PREGNANCY: "Is there any chance you are pregnant?" "When was your last menstrual period?"       N/a  Protocols used: Leg Swelling and Edema-A-AH

## 2021-10-24 ENCOUNTER — Other Ambulatory Visit: Payer: Self-pay | Admitting: *Deleted

## 2021-10-24 DIAGNOSIS — M79605 Pain in left leg: Secondary | ICD-10-CM

## 2021-10-24 DIAGNOSIS — C3490 Malignant neoplasm of unspecified part of unspecified bronchus or lung: Secondary | ICD-10-CM

## 2021-10-24 DIAGNOSIS — C763 Malignant neoplasm of pelvis: Secondary | ICD-10-CM

## 2021-10-24 DIAGNOSIS — C7931 Secondary malignant neoplasm of brain: Secondary | ICD-10-CM

## 2021-10-24 MED ORDER — OXYCODONE HCL 15 MG PO TABS: ORAL_TABLET | ORAL | 0 refills | Status: DC

## 2021-10-25 DIAGNOSIS — M5416 Radiculopathy, lumbar region: Secondary | ICD-10-CM | POA: Diagnosis not present

## 2021-10-25 NOTE — Telephone Encounter (Signed)
Attempted to reach pt. Unable to LVM.----DD,RMA

## 2021-10-27 ENCOUNTER — Telehealth: Payer: Self-pay

## 2021-10-27 ENCOUNTER — Other Ambulatory Visit: Payer: Medicaid Other

## 2021-10-27 NOTE — Telephone Encounter (Signed)
Opened in error. Sending Mychart message instead.

## 2021-10-27 NOTE — Telephone Encounter (Signed)
Update----Mychart message sent informing pt to schedule appt.----DD,RMA

## 2021-10-27 NOTE — Telephone Encounter (Signed)
Unable to reach or lvm. Letter sent. ----DD,RMA

## 2021-10-30 ENCOUNTER — Inpatient Hospital Stay: Payer: Medicaid Other

## 2021-10-31 ENCOUNTER — Inpatient Hospital Stay: Payer: Medicaid Other | Admitting: Internal Medicine

## 2021-10-31 ENCOUNTER — Telehealth: Payer: Self-pay | Admitting: Internal Medicine

## 2021-10-31 NOTE — Telephone Encounter (Signed)
Scheduled per 8/22 in basket, pt has been called and confirmed

## 2021-11-03 ENCOUNTER — Ambulatory Visit
Admission: RE | Admit: 2021-11-03 | Discharge: 2021-11-03 | Disposition: A | Discharge status: Home / Self Care | Payer: Medicaid Other | Source: Ambulatory Visit | Attending: Internal Medicine | Admitting: Internal Medicine

## 2021-11-03 DIAGNOSIS — G936 Cerebral edema: Secondary | ICD-10-CM | POA: Diagnosis not present

## 2021-11-03 DIAGNOSIS — C3490 Malignant neoplasm of unspecified part of unspecified bronchus or lung: Secondary | ICD-10-CM | POA: Diagnosis not present

## 2021-11-03 DIAGNOSIS — C7931 Secondary malignant neoplasm of brain: Secondary | ICD-10-CM

## 2021-11-03 DIAGNOSIS — G9389 Other specified disorders of brain: Secondary | ICD-10-CM | POA: Diagnosis not present

## 2021-11-03 MED ORDER — GADOBENATE DIMEGLUMINE 529 MG/ML IV SOLN
15.0000 mL | Freq: Once | INTRAVENOUS | Status: AC | PRN
  Administered 2021-11-03: 15 mL via INTRAVENOUS

## 2021-11-06 ENCOUNTER — Other Ambulatory Visit: Payer: Self-pay | Admitting: Radiation Therapy

## 2021-11-06 ENCOUNTER — Inpatient Hospital Stay: Payer: Medicaid Other

## 2021-11-06 ENCOUNTER — Ambulatory Visit: Payer: Medicaid Other

## 2021-11-06 DIAGNOSIS — C7931 Secondary malignant neoplasm of brain: Secondary | ICD-10-CM

## 2021-11-06 NOTE — Progress Notes (Signed)
Has armband been applied?  Yes  Does patient have an allergy to IV contrast dye?: No   Has patient ever received premedication for IV contrast dye?:  n/a  Does patient take metformin?: No  If patient does take metformin when was the last dose: n/a  Date of lab work: 11/07/2021 BUN: 10 CR: 0.81 eGfr: >60  IV site: Right Chest Port  Has IV site been added to flowsheet?  Yes  BP 127/81   Pulse 69   Temp 97.6 F (36.4 C)   Resp 20   Ht $R'5\' 10"'IB$  (1.778 m)   Wt 163 lb 9.6 oz (74.2 kg)   SpO2 98%   BMI 23.47 kg/m

## 2021-11-07 ENCOUNTER — Telehealth: Payer: Self-pay | Admitting: Radiation Therapy

## 2021-11-07 ENCOUNTER — Ambulatory Visit: Payer: Medicaid Other

## 2021-11-07 ENCOUNTER — Inpatient Hospital Stay (HOSPITAL_BASED_OUTPATIENT_CLINIC_OR_DEPARTMENT_OTHER): Payer: Medicaid Other | Admitting: Internal Medicine

## 2021-11-07 DIAGNOSIS — C7931 Secondary malignant neoplasm of brain: Secondary | ICD-10-CM | POA: Diagnosis not present

## 2021-11-07 NOTE — Telephone Encounter (Signed)
I called Eric Mason to discuss his upcoming radiation treatment planning appointments. He is available to come in Wed 8/30 for labs at 12:00 followed by consult with Dr. Lisbeth Renshaw and Simulation same day. Dr. Mickeal Skinner has already reviewed the recent MRI results with Eric Mason and the patient in agreement with moving forward with re-treatment of the Rt.Posterior Temporal resection cavity treated in June 2018.   Mont Dutton R.T.(R)(T) Radiation Special Procedures Navigator

## 2021-11-07 NOTE — Progress Notes (Signed)
Location/Histology of Brain Tumor: Right Parietal  Patient presented with symptoms of   MRI Brain 11/03/2021: Two punctate areas of enhancement in the right occipital lobe.  Nodules surrounding a posterior right temporal resection site continued increase in size, the largest being anterior to the resection site and measuring 1 cm.   Past or anticipated interventions, if any, per neurosurgery:  Dr. Saintclair Halsted   Past or anticipated interventions, if any, per medical oncology:  Dr. Mickeal Skinner 11/07/2021   Dose of Decadron, if applicable:   Recent neurologic symptoms, if any:  Seizures:  Headaches:  Nausea:  Dizziness/ataxia:  Difficulty with hand coordination:  Focal numbness/weakness:  Visual deficits/changes:  Confusion/Memory deficits:     SAFETY ISSUES: Prior radiation?   08/08/2018 SRS Spine: The patient's left L5 nerve root was treated to 18 Gy in 1 fraction 04/04/17 SRS Treatment: PTV2 Rt Cerebellar 47mm target received 20 Gy in a single fraction, PTV3 Rt Occipital 5mm  20 Gy in a single fraction, PTV4 Lt Frontal 67mm target  20 Gy in a single fraction.  08/16/16 SRS Treatment: PTV1 Rt Hemisphere Post Operative SRS: 3 fractions of 8 Gy for a total of 24Gy    Pacemaker/ICD?  Possible current pregnancy? n/a Is the patient on methotrexate?   Additional Complaints / other details:

## 2021-11-07 NOTE — Progress Notes (Signed)
I connected with Eric Mason on 11/07/21 at 12:00 PM EDT by telephone visit and verified that I am speaking with the correct person using two identifiers.  I discussed the limitations, risks, security and privacy concerns of performing an evaluation and management service by telemedicine and the availability of in-person appointments. I also discussed with the patient that there may be a patient responsible charge related to this service. The patient expressed understanding and agreed to proceed.  Other persons participating in the visit and their role in the encounter:  n/a  Patient's location:  Home Provider's location:  Office Chief Complaint:  Malignant neoplasm metastatic to brain [facility])  History of Present Ilness: Eric Mason reports no clinical changes today.  Continues to experience back pain as prior.  No further seizures. Observations: Language and cognition at baseline  Imaging:  [place] Clinician Interpretation: I have personally reviewed the CNS images as listed.  My interpretation, in the context of the patient's clinical presentation, is progressive disease  MR BRAIN W WO CONTRAST  Result Date: 11/04/2021 CLINICAL DATA:  Metastatic lung carcinoma, assess treatment response EXAM: MRI HEAD WITHOUT AND WITH CONTRAST TECHNIQUE: Multiplanar, multiecho pulse sequences of the brain and surrounding structures were obtained without and with intravenous contrast. CONTRAST:  14mL MULTIHANCE GADOBENATE DIMEGLUMINE 529 MG/ML IV SOLN COMPARISON:  07/07/2021 FINDINGS: BRAIN New Lesions: Two punctate areas of enhancement in the right occipital lobe on 13:57 Larger lesions: Nodules surrounding a posterior right temporal resection site continued increase in size, the largest being anterior to the resection site and measuring 1 cm. Pattern of multiple nodules at the margins is concerning. Stable or Smaller lesions: Heterogeneously enhancing lesion centered on the anterior left insula and  operculum measuring 2.5 cm, unchanged. Moderate vasogenic edema at this level is unchanged. Medial right cerebellum measuring 11 mm. Other Brain findings: Increase in vasogenic edema associated with the posterior right temporal lesion. No acute infarct, hydrocephalus, or collection Vascular: Major flow voids and vascular enhancements are preserved Skull and upper cervical spine: Normal marrow signal Sinuses/Orbits: Negative IMPRESSION: 1. Two new punctate areas of enhancement in the right occipital lobe. 2. Continued size increase of nodules around the right posterior temporal resection site. Mildly increased vasogenic edema at this level. Electronically Signed   By: Jorje Guild M.D.   On: 11/04/2021 08:17    Assessment and Plan: Malignant neoplasm metastatic to brain [facility])  Eric Mason presents with pattern of disease progression involving right parietal lesion, previously treated with craniotomy and post-op SRS in 2019.  We recommended treating with salvage radiosurgery.  He is agreeable to meet with rad-onc team tomorrow for eval and CT-sim.  Follow Up Instructions:  RTC 3 months s/p SRS  I discussed the assessment and treatment plan with the patient.  The patient was provided an opportunity to ask questions and all were answered.  The patient agreed with the plan and demonstrated understanding of the instructions.    The patient was advised to call back or seek an in-person evaluation if the symptoms worsen or if the condition fails to improve as anticipated.    Ventura Sellers, MD   I provided 15 minutes of non face-to-face telephone visit time during this encounter, and > 50% was spent counseling as documented under my assessment & plan.
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
## 2021-11-08 ENCOUNTER — Other Ambulatory Visit: Payer: Self-pay

## 2021-11-08 ENCOUNTER — Ambulatory Visit
Admission: RE | Admit: 2021-11-08 | Discharge: 2021-11-08 | Disposition: A | Discharge status: Home / Self Care | Payer: Medicaid Other | Source: Ambulatory Visit | Attending: Radiation Oncology | Admitting: Radiation Oncology

## 2021-11-08 VITALS — BP 127/81 | HR 69 | Temp 97.6°F | Resp 20 | Ht 70.0 in | Wt 163.6 lb

## 2021-11-08 DIAGNOSIS — Z923 Personal history of irradiation: Secondary | ICD-10-CM | POA: Insufficient documentation

## 2021-11-08 DIAGNOSIS — C7951 Secondary malignant neoplasm of bone: Secondary | ICD-10-CM | POA: Insufficient documentation

## 2021-11-08 DIAGNOSIS — I10 Essential (primary) hypertension: Secondary | ICD-10-CM | POA: Diagnosis not present

## 2021-11-08 DIAGNOSIS — C7931 Secondary malignant neoplasm of brain: Secondary | ICD-10-CM

## 2021-11-08 DIAGNOSIS — C801 Malignant (primary) neoplasm, unspecified: Secondary | ICD-10-CM | POA: Insufficient documentation

## 2021-11-08 DIAGNOSIS — D482 Neoplasm of uncertain behavior of peripheral nerves and autonomic nervous system: Secondary | ICD-10-CM | POA: Diagnosis not present

## 2021-11-08 DIAGNOSIS — Z803 Family history of malignant neoplasm of breast: Secondary | ICD-10-CM | POA: Diagnosis not present

## 2021-11-08 DIAGNOSIS — Z800 Family history of malignant neoplasm of digestive organs: Secondary | ICD-10-CM | POA: Diagnosis not present

## 2021-11-08 DIAGNOSIS — Z791 Long term (current) use of non-steroidal anti-inflammatories (NSAID): Secondary | ICD-10-CM | POA: Insufficient documentation

## 2021-11-08 DIAGNOSIS — Z9221 Personal history of antineoplastic chemotherapy: Secondary | ICD-10-CM | POA: Diagnosis not present

## 2021-11-08 DIAGNOSIS — Z79899 Other long term (current) drug therapy: Secondary | ICD-10-CM | POA: Diagnosis not present

## 2021-11-08 DIAGNOSIS — F17210 Nicotine dependence, cigarettes, uncomplicated: Secondary | ICD-10-CM | POA: Insufficient documentation

## 2021-11-08 DIAGNOSIS — C3490 Malignant neoplasm of unspecified part of unspecified bronchus or lung: Secondary | ICD-10-CM

## 2021-11-08 LAB — BUN & CREATININE (CHCC)
BUN: 10 mg/dL (ref 6–20)
Creatinine: 0.81 mg/dL (ref 0.61–1.24)
GFR, Estimated: >60 mL/min (ref >60)

## 2021-11-08 MED ORDER — SODIUM CHLORIDE 0.9% FLUSH
10.0000 mL | Freq: Once | INTRAVENOUS | Status: AC
  Administered 2021-11-08: 10 mL via INTRAVENOUS

## 2021-11-08 MED ORDER — HEPARIN SOD (PORK) LOCK FLUSH 100 UNIT/ML IV SOLN
500.0000 [IU] | Freq: Once | INTRAVENOUS | Status: AC
  Administered 2021-11-08: 500 [IU] via INTRAVENOUS

## 2021-11-14 ENCOUNTER — Other Ambulatory Visit: Payer: Self-pay | Admitting: *Deleted

## 2021-11-14 DIAGNOSIS — M79604 Pain in right leg: Secondary | ICD-10-CM

## 2021-11-14 DIAGNOSIS — C7931 Secondary malignant neoplasm of brain: Secondary | ICD-10-CM | POA: Diagnosis not present

## 2021-11-14 DIAGNOSIS — C801 Malignant (primary) neoplasm, unspecified: Secondary | ICD-10-CM | POA: Diagnosis not present

## 2021-11-14 DIAGNOSIS — C3490 Malignant neoplasm of unspecified part of unspecified bronchus or lung: Secondary | ICD-10-CM

## 2021-11-14 DIAGNOSIS — C763 Malignant neoplasm of pelvis: Secondary | ICD-10-CM

## 2021-11-14 MED ORDER — OXYCODONE HCL 15 MG PO TABS: ORAL_TABLET | ORAL | 0 refills | Status: DC

## 2021-11-15 ENCOUNTER — Ambulatory Visit: Payer: Medicaid Other | Admitting: Radiation Oncology

## 2021-11-15 DIAGNOSIS — C801 Malignant (primary) neoplasm, unspecified: Secondary | ICD-10-CM | POA: Diagnosis not present

## 2021-11-15 DIAGNOSIS — C7951 Secondary malignant neoplasm of bone: Secondary | ICD-10-CM | POA: Insufficient documentation

## 2021-11-15 DIAGNOSIS — C7931 Secondary malignant neoplasm of brain: Secondary | ICD-10-CM | POA: Diagnosis not present

## 2021-11-16 ENCOUNTER — Ambulatory Visit
Admission: RE | Admit: 2021-11-16 | Discharge: 2021-11-16 | Disposition: A | Discharge status: Home / Self Care | Payer: Medicaid Other | Source: Ambulatory Visit | Attending: Radiation Oncology | Admitting: Radiation Oncology

## 2021-11-16 ENCOUNTER — Other Ambulatory Visit: Payer: Self-pay

## 2021-11-16 DIAGNOSIS — C7951 Secondary malignant neoplasm of bone: Secondary | ICD-10-CM | POA: Diagnosis not present

## 2021-11-16 LAB — RAD ONC ARIA SESSION SUMMARY
Course Elapsed Days: 0
Plan Fractions Treated to Date: 1
Plan Prescribed Dose Per Fraction: 9 Gy
Plan Total Fractions Prescribed: 3
Plan Total Prescribed Dose: 27 Gy
Reference Point Dosage Given to Date: 9 Gy
Reference Point Session Dosage Given: 9 Gy
Session Number: 1

## 2021-11-16 NOTE — Progress Notes (Signed)
Mr. Kuzel rested with [place] for 15 minutes following his SRS treatment.  Patient denies headache, dizziness, nausea, diplopia or ringing in the ears. Denies fatigue. Patient without complaints. Understands to avoid strenuous activity for the next 24 hours and call [phone] with needs.  Patient aware of next appointment.  Ambulated out of the clinic without difficulty.  BP 107/73 (BP Location: Left Arm, Patient Position: Sitting, Cuff Size: Normal)   Pulse 67   Temp 97.7 F (36.5 C)   Resp 20   SpO2 95%    Ritu Gagliardo M. Leonie Green, BSN

## 2021-11-16 NOTE — Procedures (Signed)
  Name: Eric Mason  MRN: [id number]  Date: 11/16/2021   DOB: 1970-11-18  Stereotactic Radiosurgery Operative Note  PRE-OPERATIVE DIAGNOSIS:  Solitary Brain Metastasis  POST-OPERATIVE DIAGNOSIS:  Solitary Brain Metastasis  PROCEDURE:  Stereotactic Radiosurgery  SURGEON:  Elaina Hoops, MD  NARRATIVE: The patient underwent a radiation treatment planning session in the radiation oncology simulation suite under the care of the radiation oncology physician and physicist.  I participated closely in the radiation treatment planning afterwards. The patient underwent planning CT which was fused to 3T high resolution MRI with 1 mm axial slices.  These images were fused on the planning system.  We contoured the gross target volumes and subsequently expanded this to yield the Planning Target Volume. I actively participated in the planning process.  I helped to define and review the target contours and also the contours of the optic pathway, eyes, brainstem and selected nearby organs at risk.  All the dose constraints for critical structures were reviewed and compared to AAPM Task Group 101.  The prescription dose conformity was reviewed.  I approved the plan electronically.    Accordingly, Eric Mason was brought to the TrueBeam stereotactic radiation treatment linac and placed in the custom immobilization mask.  The patient was aligned according to the IR fiducial markers with BrainLab Exactrac, then orthogonal x-rays were used in ExacTrac with the 6DOF robotic table and the shifts were made to align the patient  Eric Mason received stereotactic radiosurgery uneventfully.    The detailed description of the procedure is recorded in the radiation oncology procedure note.  I was present for the duration of the procedure.  DISPOSITION:  Following delivery, the patient was transported to nursing in stable condition and monitored for possible acute effects to be discharged to home in stable  condition with follow-up in one month.  Elaina Hoops, MD 11/16/2021 1:06 PM

## 2021-11-20 ENCOUNTER — Ambulatory Visit
Admission: RE | Admit: 2021-11-20 | Discharge: 2021-11-20 | Disposition: A | Discharge status: Home / Self Care | Payer: Medicaid Other | Source: Ambulatory Visit | Attending: Radiation Oncology | Admitting: Radiation Oncology

## 2021-11-20 ENCOUNTER — Other Ambulatory Visit: Payer: Self-pay

## 2021-11-20 ENCOUNTER — Encounter: Payer: Self-pay | Admitting: Radiation Therapy

## 2021-11-20 VITALS — BP 133/84 | HR 87 | Temp 97.5°F | Resp 20

## 2021-11-20 DIAGNOSIS — C7951 Secondary malignant neoplasm of bone: Secondary | ICD-10-CM

## 2021-11-20 LAB — RAD ONC ARIA SESSION SUMMARY
Course Elapsed Days: 4
Plan Fractions Treated to Date: 2
Plan Prescribed Dose Per Fraction: 9 Gy
Plan Total Fractions Prescribed: 3
Plan Total Prescribed Dose: 27 Gy
Reference Point Dosage Given to Date: 18 Gy
Reference Point Session Dosage Given: 9 Gy
Session Number: 2

## 2021-11-20 NOTE — Progress Notes (Addendum)
Marcha Dutton, MD; Kary Kos, MD; Reece Agar, MD; Kyung Rudd, MD; Hayden Pedro, PA-C; 2 others Eric Mason was in today to receive his second fraction of brain radiosurgery. During his visit, he asked about increasing his current pain medication dosage, stating that he has been on the same dose for years now and has to take it more frequently than prescribed due to its ineffectiveness.  Mr. Kann is scheduled for a lumbar injection on 9/22 with Dr. Davy Pique, but still feels an increase in his medication is necessary. I told him that I would pass this along to his treatment team and follow-up with him with any recommendation.   Mont Dutton R.T.(R)(T)  Radiation Special Procedures Navigator

## 2021-11-20 NOTE — Progress Notes (Addendum)
Mr. Man rested with [place] for 15 minutes following his SRS treatment.  Patient denies headache, dizziness, nausea, diplopia or ringing in the ears. Denies fatigue. Patient did report pain to leg and wanted increase of pain medication.  Patient reports he told Mont Dutton and want this RN to follow up to make sure his request isn't brushed off.  This Probation officer did go directly to Ms. [place] office to inform her of patient request.  She was on the phone working on getting patient pain medication.  Patient understands to avoid strenuous activity for the next 24 hours and call [phone] with needs.  Patient aware of next appointment.  Ambulated out of the clinic with assist of cane.  Vitals:  97.5-87-20-199/84 O2sat 96 RA.

## 2021-11-21 ENCOUNTER — Telehealth: Payer: Self-pay | Admitting: *Deleted

## 2021-11-21 NOTE — Telephone Encounter (Signed)
Mont Dutton sent a message yesterday stating Mr Pellot requested an increase in the dose of his pain medication. See her note in chart from 11/20/21.

## 2021-11-22 ENCOUNTER — Ambulatory Visit: Payer: Medicaid Other | Admitting: Radiation Oncology

## 2021-11-23 ENCOUNTER — Other Ambulatory Visit: Payer: Self-pay

## 2021-11-23 ENCOUNTER — Ambulatory Visit
Admission: RE | Admit: 2021-11-23 | Discharge: 2021-11-23 | Disposition: A | Discharge status: Home / Self Care | Payer: Medicaid Other | Source: Ambulatory Visit | Attending: Radiation Oncology | Admitting: Radiation Oncology

## 2021-11-23 ENCOUNTER — Encounter: Payer: Self-pay | Admitting: Radiation Oncology

## 2021-11-23 DIAGNOSIS — C7931 Secondary malignant neoplasm of brain: Secondary | ICD-10-CM | POA: Diagnosis not present

## 2021-11-23 DIAGNOSIS — C801 Malignant (primary) neoplasm, unspecified: Secondary | ICD-10-CM | POA: Diagnosis not present

## 2021-11-23 DIAGNOSIS — Z510 Encounter for antineoplastic radiation therapy: Secondary | ICD-10-CM | POA: Diagnosis not present

## 2021-11-23 DIAGNOSIS — C7951 Secondary malignant neoplasm of bone: Secondary | ICD-10-CM | POA: Diagnosis not present

## 2021-11-23 LAB — RAD ONC ARIA SESSION SUMMARY
Course Elapsed Days: 7
Plan Fractions Treated to Date: 3
Plan Prescribed Dose Per Fraction: 9 Gy
Plan Total Fractions Prescribed: 3
Plan Total Prescribed Dose: 27 Gy
Reference Point Dosage Given to Date: 27 Gy
Reference Point Session Dosage Given: 9 Gy
Session Number: 3

## 2021-11-23 NOTE — Progress Notes (Signed)
Mr. Jeppsen rested with [place] for 15 minutes following his SRS treatment.  Patient denies headache, dizziness, nausea, diplopia or ringing in the ears. Denies fatigue. Patient without complaints. Understands to avoid strenuous activity for the next 24 hours and call [phone] with needs.  Patient aware of next appointment.  Ambulated out of the clinic without difficulty.  BP 104/73 (BP Location: Right Arm, Patient Position: Sitting, Cuff Size: Normal)   Pulse 81   Temp 97.9 F (36.6 C)   Resp 18   SpO2 97%    Ranay Ketter M. Leonie Green, BSN

## 2021-11-27 ENCOUNTER — Encounter: Payer: Self-pay | Admitting: Oncology

## 2021-11-27 NOTE — Progress Notes (Addendum)
                                                                                                                                                             Patient Name: OBRIEN HUSKINS MRN: [id number] DOB: 07-11-1970 Referring Physician: Karle Plumber Date of Service: 11/23/2021 [facility] [place]                                                        End Of Treatment Note  Diagnoses: C79.31-Secondary malignant neoplasm of brain C79.51-Secondary malignant neoplasm of bone  Cancer Staging:   Metastatic poorly differentiated carcinoma arising in the lung versus metastatic of unknown primary  Intent: Palliative  Radiation Treatment Dates:  11/16/2021 through 11/23/2021 SRS Treatment Site Technique Total Dose (Gy) Dose per Fx (Gy) Completed Fx Beam Energies  Brain:  [id number] Treatment included right posterior temporal lobe  IMRT 27/27 9 3/3 6XFFF   Narrative: The patient tolerated radiation therapy relatively well.    Plan: The patient will receive a call in about one month from the radiation oncology department. He will continue follow up with Dr. Mickeal Skinner as well.   ________________________________________________    Carola Rhine, [facility]

## 2021-12-01 ENCOUNTER — Telehealth: Payer: Self-pay

## 2021-12-01 DIAGNOSIS — M5416 Radiculopathy, lumbar region: Secondary | ICD-10-CM | POA: Diagnosis not present

## 2021-12-01 NOTE — Telephone Encounter (Signed)
Pt is doing well. Pt had lumbar injection 12/01/21. He is hopeful that it will help with back pain. Pt is on Meloxicam and states it has helped tremendously. Pt is having no ill effects from the radiation that he can identify. Pt was advised to call if he needs anything.

## 2021-12-04 ENCOUNTER — Other Ambulatory Visit: Payer: Self-pay | Admitting: Oncology

## 2021-12-04 ENCOUNTER — Telehealth: Payer: Self-pay | Admitting: *Deleted

## 2021-12-04 DIAGNOSIS — C763 Malignant neoplasm of pelvis: Secondary | ICD-10-CM

## 2021-12-04 DIAGNOSIS — M79605 Pain in left leg: Secondary | ICD-10-CM

## 2021-12-04 DIAGNOSIS — C7931 Secondary malignant neoplasm of brain: Secondary | ICD-10-CM

## 2021-12-04 DIAGNOSIS — C3490 Malignant neoplasm of unspecified part of unspecified bronchus or lung: Secondary | ICD-10-CM

## 2021-12-04 MED ORDER — OXYCODONE HCL 15 MG PO TABS: ORAL_TABLET | ORAL | 0 refills | Status: DC

## 2021-12-04 NOTE — Telephone Encounter (Signed)
Gerald Stabs called for a refill of Oxycodone to be sent to [facility]

## 2021-12-05 ENCOUNTER — Encounter: Payer: Self-pay | Admitting: Oncology

## 2021-12-12 ENCOUNTER — Inpatient Hospital Stay: Payer: Medicaid Other | Attending: Internal Medicine | Admitting: Oncology

## 2021-12-12 ENCOUNTER — Inpatient Hospital Stay: Payer: Medicaid Other

## 2021-12-12 ENCOUNTER — Other Ambulatory Visit: Payer: Self-pay

## 2021-12-12 VITALS — BP 118/66 | HR 82 | Temp 98.0°F | Resp 18 | Ht 70.0 in | Wt 164.5 lb

## 2021-12-12 DIAGNOSIS — Z452 Encounter for adjustment and management of vascular access device: Secondary | ICD-10-CM | POA: Insufficient documentation

## 2021-12-12 DIAGNOSIS — C801 Malignant (primary) neoplasm, unspecified: Secondary | ICD-10-CM | POA: Insufficient documentation

## 2021-12-12 DIAGNOSIS — C7931 Secondary malignant neoplasm of brain: Secondary | ICD-10-CM

## 2021-12-12 DIAGNOSIS — C3490 Malignant neoplasm of unspecified part of unspecified bronchus or lung: Secondary | ICD-10-CM

## 2021-12-12 DIAGNOSIS — Z95828 Presence of other vascular implants and grafts: Secondary | ICD-10-CM

## 2021-12-12 DIAGNOSIS — C7951 Secondary malignant neoplasm of bone: Secondary | ICD-10-CM | POA: Insufficient documentation

## 2021-12-12 LAB — CMP (CANCER CENTER ONLY)
ALT: 9 U/L (ref 0–44)
AST: 14 U/L — ABNORMAL LOW (ref 15–41)
Albumin: 3.9 g/dL (ref 3.5–5.0)
Alkaline Phosphatase: 100 U/L (ref 38–126)
Anion gap: 5 (ref 5–15)
BUN: 10 mg/dL (ref 6–20)
CO2: 28 mmol/L (ref 22–32)
Calcium: 8.7 mg/dL — ABNORMAL LOW (ref 8.9–10.3)
Chloride: 105 mmol/L (ref 98–111)
Creatinine: 0.77 mg/dL (ref 0.61–1.24)
GFR, Estimated: >60 mL/min (ref >60)
Glucose, Bld: 115 mg/dL — ABNORMAL HIGH (ref 70–99)
Potassium: 3.5 mmol/L (ref 3.5–5.1)
Sodium: 138 mmol/L (ref 135–145)
Total Bilirubin: 0.3 mg/dL (ref 0.3–1.2)
Total Protein: 7.1 g/dL (ref 6.5–8.1)

## 2021-12-12 LAB — CBC WITH DIFFERENTIAL (CANCER CENTER ONLY)
Abs Immature Granulocytes: 0.01 10*3/uL (ref 0.00–0.07)
Basophils Absolute: 0 10*3/uL (ref 0.0–0.1)
Basophils Relative: 1 %
Eosinophils Absolute: 0.2 10*3/uL (ref 0.0–0.5)
Eosinophils Relative: 2 %
HCT: 30.2 % — ABNORMAL LOW (ref 39.0–52.0)
Hemoglobin: 9.7 g/dL — ABNORMAL LOW (ref 13.0–17.0)
Immature Granulocytes: 0 %
Lymphocytes Relative: 49 %
Lymphs Abs: 3.2 10*3/uL (ref 0.7–4.0)
MCH: 24.7 pg — ABNORMAL LOW (ref 26.0–34.0)
MCHC: 32.1 g/dL (ref 30.0–36.0)
MCV: 77 fL — ABNORMAL LOW (ref 80.0–100.0)
Monocytes Absolute: 0.4 10*3/uL (ref 0.1–1.0)
Monocytes Relative: 7 %
Neutro Abs: 2.7 10*3/uL (ref 1.7–7.7)
Neutrophils Relative %: 41 %
Platelet Count: 312 10*3/uL (ref 150–400)
RBC: 3.92 MIL/uL — ABNORMAL LOW (ref 4.22–5.81)
RDW: 16.6 % — ABNORMAL HIGH (ref 11.5–15.5)
WBC Count: 6.6 10*3/uL (ref 4.0–10.5)
nRBC: 0 % (ref 0.0–0.2)

## 2021-12-12 MED ORDER — SODIUM CHLORIDE 0.9% FLUSH
10.0000 mL | Freq: Once | INTRAVENOUS | Status: AC
  Administered 2021-12-12: 10 mL

## 2021-12-12 MED ORDER — HEPARIN SOD (PORK) LOCK FLUSH 100 UNIT/ML IV SOLN
500.0000 [IU] | Freq: Once | INTRAVENOUS | Status: AC
  Administered 2021-12-12: 500 [IU]

## 2021-12-12 NOTE — Progress Notes (Signed)
Hematology and Oncology Follow Up   Eric Mason [id number] [birth year] 51 y.o. 12/12/2021 1:30 PM Eric Mason, [name], Eric Batman, MD       Principle Diagnosis: 51 year old man with advanced malignancy with predominantly CNS involvement diagnosed in 2018.  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   Prior Therapy:  Status post craniotomy and resection in May 2018. This was followed by [facility] completed in June 2018.   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   Carboplatin and paclitaxel with Pembrolizumab cycle 1 given on 06/18/2017.  He completed 4 cycles of carboplatin and paclitaxel and completed 6 cycles of Pembrolizumab total.   He is status post repeat radiation to L5 presumed tumor ablated in June 2020.  He is status post SRS treatment completed on November 23, 2021 due to progression in the right posterior temporal lobe and to new lesions of the right occipital lobe.   Current therapy: Active surveillance.   Interim History: [name] presents today for a follow-up visit.  Since last visit, he completed SRS without any major complaints.  He has reported residual headaches but no other issues.  He denies any nausea, vomiting or abdominal pain.  He does report chronic flank pain and back pain that is manageable with the current regimen.  He denies any hospitalizations or illnesses.  He still ambulatory without any falls or syncope.    Medications: Updated on review. Current Outpatient Medications  Medication Sig Dispense Refill   albuterol (PROAIR HFA) 108 (90 Base) MCG/ACT inhaler Inhale 2 puffs into the lungs every 6 (six) hours as needed for wheezing or shortness of breath.(200/8=25) 8.5 g 6   amitriptyline (ELAVIL) 75 MG tablet Take 1 tablet (75 mg total)  by mouth at bedtime. 60 tablet 2   B Complex-C (B-COMPLEX WITH VITAMIN C) tablet Take 1 tablet by mouth daily.      baclofen (LIORESAL) 10 MG tablet Take 1 tablet (10 mg total) by mouth 3 (three) times daily. 90 each 5   bisacodyl (DULCOLAX) 5 MG EC tablet Take 2 tablets (10 mg total) by mouth daily as needed for moderate constipation. 30 tablet 0   Cholecalciferol (VITAMIN D) 50 MCG (2000 UT) CAPS Take 2,000 Units by mouth daily.     dexamethasone (DECADRON) 1 MG tablet Take 1 tablet (1 mg total) by mouth daily. 60 tablet 2   diclofenac Sodium (VOLTAREN) 1 % GEL Apply 2 g topically 4 (four) times daily. 50 g 1   DULoxetine (CYMBALTA) 30 MG capsule Take 1 capsule (30 mg total) by mouth daily. 30 capsule 5   folic acid (FOLVITE) 1 MG tablet Take 1 tablet (1 mg total) by mouth daily. 100 tablet 1   gabapentin (NEURONTIN) 800 MG tablet TAKE 1 TABLET BY MOUTH 3 TIMES DAILY 90 tablet 2   levETIRAcetam (KEPPRA) 1000 MG tablet Take 2 tablets (2,000 mg total) by mouth 2 (two) times daily. 120 tablet 3   meloxicam (MOBIC) 7.5 MG tablet Take 7.5 mg by mouth 2 (two) times daily.     Multiple Vitamin (MULTIVITAMIN) tablet Take 1 tablet by mouth daily. 30 tablet 0   NARCAN 4 MG/0.1ML LIQD nasal spray kit See admin instructions.     nicotine (NICODERM CQ - DOSED IN MG/24 HOURS) 21 mg/24hr patch Place 1 patch (21 mg total) onto the  skin daily. 28 patch 0   oxyCODONE (ROXICODONE) 15 MG immediate release tablet Take Every 4 hours as needed 126 tablet 0   pantoprazole (PROTONIX) 40 MG tablet Take 1 tablet(s) by mouth TWO TIMES DAILY 60 tablet 5   polyethylene glycol (MIRALAX / GLYCOLAX) 17 g packet Take 17 g by mouth daily as needed for moderate constipation.     promethazine (PHENERGAN) 25 MG tablet Take 1 tablet (25 mg total) by mouth every 6 (six) hours as needed for nausea or vomiting. 30 tablet 0   senna-docusate (SENOKOT-S) 8.6-50 MG tablet Take 2 tablets by mouth 2 (two) times daily. 30 tablet 1   thiamine  (VITAMIN B-1) 100 MG tablet Take 1 tablet (100 mg total) by mouth daily. 100 tablet 0   No current facility-administered medications for this visit.   Facility-Administered Medications Ordered in Other Visits  Medication Dose Route Frequency Provider Last Rate Last Admin   sodium chloride flush (NS) 0.9 % injection 10 mL  10 mL Intracatheter PRN Eric Portela, MD   10 mL at 06/18/17 1815     Allergies:  Allergies  Allergen Reactions   Latex Itching and Rash     Physical examination:    Blood pressure 118/66, pulse 82, temperature 98 F (36.7 C), temperature source Tympanic, resp. rate 18, height _0  (1.778 m), weight 164 lb 8 oz (74.6 kg), SpO2 98 %.      ECOG 1   General appearance: Alert, awake without any distress. Head: Atraumatic without abnormalities Oropharynx: Without any thrush or ulcers. Eyes: No scleral icterus. Lymph nodes: No lymphadenopathy noted in the cervical, supraclavicular, or axillary nodes Heart:regular rate and rhythm, without any murmurs or gallops.   Lung: Clear to auscultation without any rhonchi, wheezes or dullness to percussion. Abdomin: Soft, nontender without any shifting dullness or ascites. Musculoskeletal: No clubbing or cyanosis. Neurological: No motor or sensory deficits. Skin: No rashes or lesions.             Lab Results: Lab Results  Component Value Date   WBC 8.9 12/09/2020   HGB 10.0 (L) 12/09/2020   HCT 31.6 (L) 12/09/2020   MCV 77.3 (L) 12/09/2020   PLT 305 12/09/2020     Chemistry      Component Value Date/Time   NA 139 12/09/2020 0945   NA 140 10/27/2020 1521   NA 137 10/30/2016 0909   K 3.9 12/09/2020 0945   K 3.3 (L) 10/30/2016 0909   CL 102 12/09/2020 0945   CO2 26 12/09/2020 0945   CO2 27 10/30/2016 0909   BUN 10 11/08/2021 1237   BUN 9 10/27/2020 1521   BUN 4.8 (L) 10/30/2016 0909   CREATININE 0.81 11/08/2021 1237   CREATININE 0.9 10/30/2016 0909      Component Value Date/Time    CALCIUM 9.3 12/09/2020 0945   CALCIUM 9.3 10/30/2016 0909   ALKPHOS 99 12/09/2020 0945   ALKPHOS 93 10/30/2016 0909   AST 17 12/09/2020 0945   AST 35 (H) 10/30/2016 0909   ALT 12 12/09/2020 0945   ALT 25 10/30/2016 0909   BILITOT 0.3 12/09/2020 0945   BILITOT 0.41 10/30/2016 0909        Impression and Plan:  51 year old man with:   1.   Stage IV poorly differentiated carcinoma of unknown primary diagnosed in 2018 after presenting with CNS disease and presumed lung primary.   He has received systemic therapy in 2019 although he has not had any additional treatment due  to lack of systemic disease.  Imaging studies from November 2022 showed no recurrence.  At this time, I have recommended updating his staging scans to evaluate whether he has systemic disease.  Obtaining repeat tissue biopsy for next generation sequencing would be recommended if he has no disease progression to better target his treatment if he needs any systemic treatment.  He is agreeable to proceed with this plan.  2.  CNS disease: Continues to follow with Dr. Mickeal Skinner.  He had a recent [facility] treatment.  He has follow-up MRI scheduled in the future.   3.  Port-A-Cath management: These will be flushed periodically.   4.  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  He is currently on oxycodone.    5. Follow-up: He will return in 6 months.  Sooner if his imaging studies showed need for intervention from systemic standpoint.   30  minutes were spent on this encounter.  The time was dedicated to updating his disease status, treatment choices and outlining future plan of care review.    Zola Button, MD 12/12/2021 1:30 PM

## 2021-12-13 ENCOUNTER — Other Ambulatory Visit: Payer: Self-pay | Admitting: Oncology

## 2021-12-13 ENCOUNTER — Telehealth: Payer: Self-pay | Admitting: *Deleted

## 2021-12-13 MED ORDER — LIDOCAINE 5 % EX PTCH: 1.0000 | MEDICATED_PATCH | CUTANEOUS | 1 refills | Status: DC

## 2021-12-13 NOTE — Telephone Encounter (Signed)
Mr. Medlen called to ask if Dr. Alen Blew had sent the new medicine in for his back pain. He had called the pharmacy and no new prescriptions were received.   He said it was discussed during his appt on 12/12/21. He said he'd told Dr. Alen Blew that the creme prescribed (diclofenac) did not help his back and Dr. Alen Blew said he could prescribe a patch of some kind.   Message routed to Dr. Alen Blew

## 2021-12-14 ENCOUNTER — Encounter: Payer: Self-pay | Admitting: Oncology

## 2021-12-16 ENCOUNTER — Other Ambulatory Visit: Payer: Self-pay | Admitting: Internal Medicine

## 2021-12-17 ENCOUNTER — Encounter: Payer: Self-pay | Admitting: Oncology

## 2021-12-22 ENCOUNTER — Other Ambulatory Visit: Payer: Self-pay | Admitting: Oncology

## 2021-12-22 ENCOUNTER — Telehealth: Payer: Self-pay | Admitting: *Deleted

## 2021-12-22 DIAGNOSIS — M79605 Pain in left leg: Secondary | ICD-10-CM

## 2021-12-22 DIAGNOSIS — C763 Malignant neoplasm of pelvis: Secondary | ICD-10-CM

## 2021-12-22 DIAGNOSIS — C3490 Malignant neoplasm of unspecified part of unspecified bronchus or lung: Secondary | ICD-10-CM

## 2021-12-22 DIAGNOSIS — C7931 Secondary malignant neoplasm of brain: Secondary | ICD-10-CM

## 2021-12-22 MED ORDER — OXYCODONE HCL 15 MG PO TABS: ORAL_TABLET | ORAL | 0 refills | Status: DC

## 2021-12-22 NOTE — Telephone Encounter (Signed)
Eric Mason is requesting a refill of his Oxycodone to be sent to [facility]. Will run out on Sunday.

## 2021-12-25 ENCOUNTER — Ambulatory Visit
Admission: RE | Admit: 2021-12-25 | Discharge: 2021-12-25 | Disposition: A | Discharge status: Home / Self Care | Payer: Medicaid Other | Source: Ambulatory Visit | Attending: Radiation Oncology | Admitting: Radiation Oncology

## 2021-12-25 NOTE — Progress Notes (Signed)
  Radiation Oncology         [phone]) [phone] ________________________________  Name: Eric Mason MRN: [id number]  Date of Service: 12/25/2021  DOB: [birth year]  Post Treatment Telephone Note  Diagnosis:  Metastatic poorly differentiated carcinoma arising in the lung versus metastatic of unknown primary  Intent: Palliative  Radiation Treatment Dates:  11/16/2021 through 11/23/2021 SRS Treatment Site Technique Total Dose (Gy) Dose per Fx (Gy) Completed Fx Beam Energies  Brain:  [id number] Treatment included right posterior temporal lobe, and two new punctate lesions in the right occipital lobe IMRT 27/27 9 3/3 6XFFF  (as documented in provider EOT note)   The patient was available for call today.  The patient did  note fatigue during radiation. The patient did  note hair loss or skin changes in the field of radiation during therapy.  The patient is  taking dexamethasone. The patient does have symptoms of  weakness or loss of control of the extremities but this predates [place]. The patient does have symptoms of headache. The patient does not have symptoms of seizure or uncontrolled movement. The patient does have symptoms of changes in vision but this has been discussed w/ Dr. Mickeal Skinner. The patient does not have changes in speech.  The patient does have confusion but this also predated [place].   The patient was counseled that he will be contacted by our brain and spine navigator to schedule surveillance imaging. The patient was encouraged to call if  he  have not received a call to schedule imaging, or if he  develop concerns or questions regarding radiation. The patient will also continue to follow up with Dr. Mickeal Skinner in medical oncology.

## 2021-12-26 ENCOUNTER — Other Ambulatory Visit: Payer: Self-pay | Admitting: Radiation Therapy

## 2021-12-26 DIAGNOSIS — C7931 Secondary malignant neoplasm of brain: Secondary | ICD-10-CM

## 2022-01-08 ENCOUNTER — Ambulatory Visit (HOSPITAL_COMMUNITY): Payer: Medicaid Other

## 2022-01-11 ENCOUNTER — Other Ambulatory Visit: Payer: Self-pay | Admitting: *Deleted

## 2022-01-11 DIAGNOSIS — C3490 Malignant neoplasm of unspecified part of unspecified bronchus or lung: Secondary | ICD-10-CM

## 2022-01-11 DIAGNOSIS — M79604 Pain in right leg: Secondary | ICD-10-CM

## 2022-01-11 DIAGNOSIS — C7931 Secondary malignant neoplasm of brain: Secondary | ICD-10-CM

## 2022-01-11 DIAGNOSIS — C763 Malignant neoplasm of pelvis: Secondary | ICD-10-CM

## 2022-01-11 MED ORDER — OXYCODONE HCL 15 MG PO TABS: ORAL_TABLET | ORAL | 0 refills | Status: DC

## 2022-01-11 NOTE — Telephone Encounter (Signed)
Patient called - requested refill of Oxycodone/ROXICODONE IR 15 MG.  Refill request routed to Dr. Alen Blew

## 2022-01-14 ENCOUNTER — Ambulatory Visit (HOSPITAL_COMMUNITY)
Admission: RE | Admit: 2022-01-14 | Discharge: 2022-01-14 | Disposition: A | Discharge status: Home / Self Care | Payer: Medicaid Other | Source: Ambulatory Visit | Attending: Oncology | Admitting: Oncology

## 2022-01-14 DIAGNOSIS — C7931 Secondary malignant neoplasm of brain: Secondary | ICD-10-CM | POA: Insufficient documentation

## 2022-01-14 DIAGNOSIS — J439 Emphysema, unspecified: Secondary | ICD-10-CM | POA: Diagnosis not present

## 2022-01-14 DIAGNOSIS — C3490 Malignant neoplasm of unspecified part of unspecified bronchus or lung: Secondary | ICD-10-CM | POA: Diagnosis present

## 2022-01-14 DIAGNOSIS — J984 Other disorders of lung: Secondary | ICD-10-CM | POA: Diagnosis not present

## 2022-01-14 DIAGNOSIS — J432 Centrilobular emphysema: Secondary | ICD-10-CM | POA: Diagnosis not present

## 2022-01-14 DIAGNOSIS — R918 Other nonspecific abnormal finding of lung field: Secondary | ICD-10-CM | POA: Diagnosis not present

## 2022-01-14 MED ORDER — HEPARIN SOD (PORK) LOCK FLUSH 100 UNIT/ML IV SOLN
INTRAVENOUS | Status: AC
  Filled 2022-01-14: qty 5

## 2022-01-14 MED ORDER — IOHEXOL 300 MG/ML  SOLN
100.0000 mL | Freq: Once | INTRAMUSCULAR | Status: AC | PRN
  Administered 2022-01-14: 100 mL via INTRAVENOUS

## 2022-01-17 ENCOUNTER — Telehealth: Payer: Self-pay | Admitting: *Deleted

## 2022-01-17 NOTE — Telephone Encounter (Signed)
Patient called - requested change to records: He asked that current mobile and home numbers be deleted and new mobile number [phone] be entered as primary contact number for him.  Changes made as patient requested - contact info updated.

## 2022-01-25 NOTE — Progress Notes (Signed)
Addendum The patient's note should indicate a change in the treatment plan. He will receive re-irradiation to the lesion that was treated in 2018, but the other punctate changes will be followed expectantly.     Carola Rhine, PAC

## 2022-01-25 NOTE — Addendum Note (Signed)
Encounter addended by: Hayden Pedro, PA-C on: 01/25/2022 1:15 PM  Actions taken: Clinical Note Signed

## 2022-01-31 ENCOUNTER — Other Ambulatory Visit: Payer: Self-pay | Admitting: Oncology

## 2022-01-31 ENCOUNTER — Telehealth: Payer: Self-pay | Admitting: *Deleted

## 2022-01-31 DIAGNOSIS — C3490 Malignant neoplasm of unspecified part of unspecified bronchus or lung: Secondary | ICD-10-CM

## 2022-01-31 DIAGNOSIS — C763 Malignant neoplasm of pelvis: Secondary | ICD-10-CM

## 2022-01-31 DIAGNOSIS — C7931 Secondary malignant neoplasm of brain: Secondary | ICD-10-CM

## 2022-01-31 DIAGNOSIS — M79604 Pain in right leg: Secondary | ICD-10-CM

## 2022-01-31 MED ORDER — OXYCODONE HCL 15 MG PO TABS: ORAL_TABLET | ORAL | 0 refills | Status: DC

## 2022-01-31 NOTE — Telephone Encounter (Signed)
Eric Mason is calling for a refill of Oxycodone to [facility]. Will pick up on Friday.

## 2022-02-05 ENCOUNTER — Telehealth: Payer: Self-pay | Admitting: Oncology

## 2022-02-05 NOTE — Telephone Encounter (Signed)
Called patient regarding December and February port flush appointments and providers departure. Patient's April appointment has been rescheduled with new provider.

## 2022-02-07 ENCOUNTER — Emergency Department (HOSPITAL_COMMUNITY): Payer: Medicaid Other

## 2022-02-07 ENCOUNTER — Other Ambulatory Visit: Payer: Self-pay

## 2022-02-07 ENCOUNTER — Inpatient Hospital Stay (HOSPITAL_COMMUNITY)
Admission: EM | Admit: 2022-02-07 | Discharge: 2022-02-10 | DRG: 080 | Disposition: A | Discharge status: Home / Self Care | Payer: Medicaid Other | Attending: Internal Medicine | Admitting: Internal Medicine

## 2022-02-07 DIAGNOSIS — R109 Unspecified abdominal pain: Secondary | ICD-10-CM | POA: Diagnosis not present

## 2022-02-07 DIAGNOSIS — Z91040 Latex allergy status: Secondary | ICD-10-CM | POA: Diagnosis not present

## 2022-02-07 DIAGNOSIS — Z8051 Family history of malignant neoplasm of kidney: Secondary | ICD-10-CM

## 2022-02-07 DIAGNOSIS — R112 Nausea with vomiting, unspecified: Secondary | ICD-10-CM | POA: Diagnosis not present

## 2022-02-07 DIAGNOSIS — E876 Hypokalemia: Secondary | ICD-10-CM | POA: Diagnosis not present

## 2022-02-07 DIAGNOSIS — Z515 Encounter for palliative care: Secondary | ICD-10-CM | POA: Diagnosis not present

## 2022-02-07 DIAGNOSIS — R627 Adult failure to thrive: Secondary | ICD-10-CM | POA: Diagnosis present

## 2022-02-07 DIAGNOSIS — C7931 Secondary malignant neoplasm of brain: Secondary | ICD-10-CM | POA: Diagnosis present

## 2022-02-07 DIAGNOSIS — R9431 Abnormal electrocardiogram [ECG] [EKG]: Secondary | ICD-10-CM | POA: Diagnosis not present

## 2022-02-07 DIAGNOSIS — F17210 Nicotine dependence, cigarettes, uncomplicated: Secondary | ICD-10-CM | POA: Diagnosis present

## 2022-02-07 DIAGNOSIS — M87052 Idiopathic aseptic necrosis of left femur: Secondary | ICD-10-CM | POA: Insufficient documentation

## 2022-02-07 DIAGNOSIS — Z7189 Other specified counseling: Secondary | ICD-10-CM | POA: Diagnosis not present

## 2022-02-07 DIAGNOSIS — I10 Essential (primary) hypertension: Secondary | ICD-10-CM | POA: Diagnosis present

## 2022-02-07 DIAGNOSIS — R509 Fever, unspecified: Secondary | ICD-10-CM | POA: Insufficient documentation

## 2022-02-07 DIAGNOSIS — Z720 Tobacco use: Secondary | ICD-10-CM

## 2022-02-07 DIAGNOSIS — Z79899 Other long term (current) drug therapy: Secondary | ICD-10-CM | POA: Diagnosis not present

## 2022-02-07 DIAGNOSIS — Z791 Long term (current) use of non-steroidal anti-inflammatories (NSAID): Secondary | ICD-10-CM | POA: Diagnosis not present

## 2022-02-07 DIAGNOSIS — E860 Dehydration: Secondary | ICD-10-CM | POA: Diagnosis present

## 2022-02-07 DIAGNOSIS — Z800 Family history of malignant neoplasm of digestive organs: Secondary | ICD-10-CM

## 2022-02-07 DIAGNOSIS — G9340 Encephalopathy, unspecified: Secondary | ICD-10-CM | POA: Diagnosis not present

## 2022-02-07 DIAGNOSIS — G936 Cerebral edema: Secondary | ICD-10-CM | POA: Diagnosis not present

## 2022-02-07 DIAGNOSIS — J449 Chronic obstructive pulmonary disease, unspecified: Secondary | ICD-10-CM | POA: Diagnosis present

## 2022-02-07 DIAGNOSIS — Z803 Family history of malignant neoplasm of breast: Secondary | ICD-10-CM | POA: Diagnosis not present

## 2022-02-07 DIAGNOSIS — I700 Atherosclerosis of aorta: Secondary | ICD-10-CM | POA: Diagnosis not present

## 2022-02-07 DIAGNOSIS — M160 Bilateral primary osteoarthritis of hip: Secondary | ICD-10-CM | POA: Diagnosis present

## 2022-02-07 DIAGNOSIS — C3490 Malignant neoplasm of unspecified part of unspecified bronchus or lung: Secondary | ICD-10-CM | POA: Diagnosis not present

## 2022-02-07 DIAGNOSIS — R1084 Generalized abdominal pain: Secondary | ICD-10-CM | POA: Diagnosis not present

## 2022-02-07 DIAGNOSIS — I959 Hypotension, unspecified: Secondary | ICD-10-CM | POA: Diagnosis not present

## 2022-02-07 DIAGNOSIS — R4182 Altered mental status, unspecified: Secondary | ICD-10-CM | POA: Diagnosis not present

## 2022-02-07 DIAGNOSIS — C7951 Secondary malignant neoplasm of bone: Secondary | ICD-10-CM | POA: Diagnosis present

## 2022-02-07 DIAGNOSIS — M879 Osteonecrosis, unspecified: Secondary | ICD-10-CM | POA: Diagnosis present

## 2022-02-07 DIAGNOSIS — Z8249 Family history of ischemic heart disease and other diseases of the circulatory system: Secondary | ICD-10-CM

## 2022-02-07 DIAGNOSIS — C7800 Secondary malignant neoplasm of unspecified lung: Secondary | ICD-10-CM | POA: Diagnosis not present

## 2022-02-07 DIAGNOSIS — G9341 Metabolic encephalopathy: Secondary | ICD-10-CM | POA: Diagnosis present

## 2022-02-07 DIAGNOSIS — R1110 Vomiting, unspecified: Secondary | ICD-10-CM | POA: Diagnosis not present

## 2022-02-07 DIAGNOSIS — M25552 Pain in left hip: Secondary | ICD-10-CM | POA: Diagnosis not present

## 2022-02-07 DIAGNOSIS — Z923 Personal history of irradiation: Secondary | ICD-10-CM | POA: Diagnosis not present

## 2022-02-07 DIAGNOSIS — R110 Nausea: Secondary | ICD-10-CM | POA: Diagnosis not present

## 2022-02-07 DIAGNOSIS — R400 Somnolence: Secondary | ICD-10-CM | POA: Diagnosis not present

## 2022-02-07 DIAGNOSIS — Z1152 Encounter for screening for COVID-19: Secondary | ICD-10-CM | POA: Diagnosis not present

## 2022-02-07 DIAGNOSIS — R1111 Vomiting without nausea: Secondary | ICD-10-CM | POA: Diagnosis not present

## 2022-02-07 DIAGNOSIS — G9389 Other specified disorders of brain: Secondary | ICD-10-CM | POA: Diagnosis not present

## 2022-02-07 DIAGNOSIS — Z85118 Personal history of other malignant neoplasm of bronchus and lung: Secondary | ICD-10-CM | POA: Diagnosis not present

## 2022-02-07 DIAGNOSIS — Z808 Family history of malignant neoplasm of other organs or systems: Secondary | ICD-10-CM | POA: Diagnosis not present

## 2022-02-07 DIAGNOSIS — R918 Other nonspecific abnormal finding of lung field: Secondary | ICD-10-CM | POA: Diagnosis not present

## 2022-02-07 DIAGNOSIS — R569 Unspecified convulsions: Secondary | ICD-10-CM

## 2022-02-07 DIAGNOSIS — R531 Weakness: Secondary | ICD-10-CM | POA: Diagnosis not present

## 2022-02-07 LAB — COMPREHENSIVE METABOLIC PANEL
ALT: 10 U/L (ref 0–44)
AST: 14 U/L — ABNORMAL LOW (ref 15–41)
Albumin: 3.5 g/dL (ref 3.5–5.0)
Alkaline Phosphatase: 88 U/L (ref 38–126)
Anion gap: 9 (ref 5–15)
BUN: 9 mg/dL (ref 6–20)
CO2: 24 mmol/L (ref 22–32)
Calcium: 9 mg/dL (ref 8.9–10.3)
Chloride: 104 mmol/L (ref 98–111)
Creatinine, Ser: 0.76 mg/dL (ref 0.61–1.24)
GFR, Estimated: >60 mL/min (ref >60)
Glucose, Bld: 116 mg/dL — ABNORMAL HIGH (ref 70–99)
Potassium: 3.1 mmol/L — ABNORMAL LOW (ref 3.5–5.1)
Sodium: 137 mmol/L (ref 135–145)
Total Bilirubin: 0.4 mg/dL (ref 0.3–1.2)
Total Protein: 8.1 g/dL (ref 6.5–8.1)

## 2022-02-07 LAB — RESP PANEL BY RT-PCR (FLU A&B, COVID) ARPGX2
Influenza A by PCR: NEGATIVE
Influenza B by PCR: NEGATIVE
SARS Coronavirus 2 by RT PCR: NEGATIVE

## 2022-02-07 LAB — CBC WITH DIFFERENTIAL/PLATELET
Abs Immature Granulocytes: 0.02 10*3/uL (ref 0.00–0.07)
Basophils Absolute: 0 10*3/uL (ref 0.0–0.1)
Basophils Relative: 0 %
Eosinophils Absolute: 0 10*3/uL (ref 0.0–0.5)
Eosinophils Relative: 0 %
HCT: 33.7 % — ABNORMAL LOW (ref 39.0–52.0)
Hemoglobin: 10.5 g/dL — ABNORMAL LOW (ref 13.0–17.0)
Immature Granulocytes: 0 %
Lymphocytes Relative: 20 %
Lymphs Abs: 2.1 10*3/uL (ref 0.7–4.0)
MCH: 23.7 pg — ABNORMAL LOW (ref 26.0–34.0)
MCHC: 31.2 g/dL (ref 30.0–36.0)
MCV: 76.1 fL — ABNORMAL LOW (ref 80.0–100.0)
Monocytes Absolute: 0.7 10*3/uL (ref 0.1–1.0)
Monocytes Relative: 7 %
Neutro Abs: 7.8 10*3/uL — ABNORMAL HIGH (ref 1.7–7.7)
Neutrophils Relative %: 73 %
Platelets: 428 10*3/uL — ABNORMAL HIGH (ref 150–400)
RBC: 4.43 MIL/uL (ref 4.22–5.81)
RDW: 16.1 % — ABNORMAL HIGH (ref 11.5–15.5)
WBC: 10.7 10*3/uL — ABNORMAL HIGH (ref 4.0–10.5)
nRBC: 0 % (ref 0.0–0.2)

## 2022-02-07 LAB — URINALYSIS, ROUTINE W REFLEX MICROSCOPIC
Bilirubin Urine: NEGATIVE
Glucose, UA: NEGATIVE mg/dL
Hgb urine dipstick: NEGATIVE
Ketones, ur: 20 mg/dL — AB
Leukocytes,Ua: NEGATIVE
Nitrite: NEGATIVE
Protein, ur: NEGATIVE mg/dL
Specific Gravity, Urine: 1.016 (ref 1.005–1.030)
pH: 8 (ref 5.0–8.0)

## 2022-02-07 LAB — CBG MONITORING, ED
Glucose-Capillary: 98 mg/dL (ref 70–99)
Glucose-Capillary: 87 mg/dL (ref 70–99)

## 2022-02-07 LAB — LIPASE, BLOOD: Lipase: 32 U/L (ref 11–51)

## 2022-02-07 MED ORDER — SODIUM CHLORIDE 0.9 % IV SOLN
200.0000 mg | Freq: Once | INTRAVENOUS | Status: AC
  Administered 2022-02-07: 200 mg via INTRAVENOUS
  Filled 2022-02-07: qty 20

## 2022-02-07 MED ORDER — LEVETIRACETAM IN NACL 1000 MG/100ML IV SOLN: 1000.0000 mg | Freq: Two times a day (BID) | INTRAVENOUS | Status: DC

## 2022-02-07 MED ORDER — DEXAMETHASONE SODIUM PHOSPHATE 4 MG/ML IJ SOLN
3.0000 mg | Freq: Two times a day (BID) | INTRAMUSCULAR | Status: DC
  Administered 2022-02-07 – 2022-02-10 (×6): 3 mg via INTRAVENOUS
  Filled 2022-02-07 (×6): qty 1

## 2022-02-07 MED ORDER — LEVETIRACETAM IN NACL 1000 MG/100ML IV SOLN
1000.0000 mg | Freq: Two times a day (BID) | INTRAVENOUS | Status: DC
  Administered 2022-02-08: 1000 mg via INTRAVENOUS
  Filled 2022-02-07: qty 100

## 2022-02-07 MED ORDER — SODIUM CHLORIDE (PF) 0.9 % IJ SOLN
INTRAMUSCULAR | Status: AC
  Filled 2022-02-07: qty 50

## 2022-02-07 MED ORDER — ACETAMINOPHEN 650 MG RE SUPP: 650.0000 mg | Freq: Four times a day (QID) | RECTAL | Status: DC | PRN

## 2022-02-07 MED ORDER — LEVETIRACETAM IN NACL 1000 MG/100ML IV SOLN
1000.0000 mg | Freq: Once | INTRAVENOUS | Status: AC
  Administered 2022-02-07: 1000 mg via INTRAVENOUS
  Filled 2022-02-07: qty 100

## 2022-02-07 MED ORDER — LACOSAMIDE 50 MG PO TABS
100.0000 mg | ORAL_TABLET | Freq: Two times a day (BID) | ORAL | Status: DC
  Administered 2022-02-08: 100 mg via ORAL
  Filled 2022-02-07: qty 2

## 2022-02-07 MED ORDER — ACETAMINOPHEN 325 MG PO TABS
650.0000 mg | ORAL_TABLET | Freq: Four times a day (QID) | ORAL | Status: DC | PRN
  Administered 2022-02-07 – 2022-02-10 (×3): 650 mg via ORAL
  Filled 2022-02-07 (×3): qty 2

## 2022-02-07 MED ORDER — LORAZEPAM 2 MG/ML IJ SOLN
0.5000 mg | Freq: Once | INTRAMUSCULAR | Status: AC
  Administered 2022-02-07: 0.5 mg via INTRAVENOUS
  Filled 2022-02-07: qty 1

## 2022-02-07 MED ORDER — MORPHINE SULFATE (PF) 4 MG/ML IV SOLN
4.0000 mg | Freq: Once | INTRAVENOUS | Status: AC
  Administered 2022-02-07: 4 mg via INTRAVENOUS
  Filled 2022-02-07: qty 1

## 2022-02-07 MED ORDER — ONDANSETRON HCL 4 MG/2ML IJ SOLN
4.0000 mg | Freq: Once | INTRAMUSCULAR | Status: AC
Start: 2022-02-07 — End: 2022-02-07
  Administered 2022-02-07: 4 mg via INTRAVENOUS
  Filled 2022-02-07: qty 2

## 2022-02-07 MED ORDER — SODIUM CHLORIDE 0.9 % IV BOLUS
1000.0000 mL | Freq: Once | INTRAVENOUS | Status: AC
  Administered 2022-02-07: 1000 mL via INTRAVENOUS

## 2022-02-07 MED ORDER — IOHEXOL 300 MG/ML  SOLN
100.0000 mL | Freq: Once | INTRAMUSCULAR | Status: AC | PRN
  Administered 2022-02-07: 100 mL via INTRAVENOUS

## 2022-02-07 MED ORDER — POTASSIUM CHLORIDE CRYS ER 20 MEQ PO TBCR
40.0000 meq | EXTENDED_RELEASE_TABLET | Freq: Once | ORAL | Status: AC
  Administered 2022-02-07: 40 meq via ORAL
  Filled 2022-02-07: qty 2

## 2022-02-07 MED ORDER — NICOTINE 7 MG/24HR TD PT24
7.0000 mg | MEDICATED_PATCH | Freq: Every day | TRANSDERMAL | Status: DC
  Administered 2022-02-10: 7 mg via TRANSDERMAL
  Filled 2022-02-07 (×3): qty 1

## 2022-02-07 MED ORDER — GADOBUTROL 1 MMOL/ML IV SOLN
7.5000 mL | Freq: Once | INTRAVENOUS | Status: AC | PRN
  Administered 2022-02-07: 7.5 mL via INTRAVENOUS

## 2022-02-07 MED ORDER — SENNOSIDES-DOCUSATE SODIUM 8.6-50 MG PO TABS: 1.0000 | ORAL_TABLET | Freq: Every evening | ORAL | Status: DC | PRN

## 2022-02-07 NOTE — ED Provider Notes (Signed)
[facility]-EMERGENCY DEPT Provider Note   CSN: [id number] Arrival date & time: 02/07/22  1148     History  Chief Complaint  Patient presents with   Weakness    Eric Mason is a 50 y.o. male.  Patient with history of lung cancer with metastasis to the brain presents today with complaints of nausea, vomiting, and abdominal pain. He states that same has been ongoing since he woke up this morning. Family states they were concerned that the patient was more lethargic than normal. Patient states his pain is located throughout his abdomen. Denies any history of pain similar. He hasn't had chemotherapy in 2 months and last radiation was 1 month ago. Denies any hematemesis, hematochezia, or melena. He has had a normal bowel movement today. Denies fevers, chills, headache, blurred vision, cough, congestion, chest pain, shortness of breath, or diarrhea.  The history is provided by the patient. No language interpreter was used.  Weakness Associated symptoms: abdominal pain, nausea and vomiting        Home Medications Prior to Admission medications   Medication Sig Start Date End Date Taking? Authorizing Provider  albuterol (PROAIR HFA) 108 (90 Base) MCG/ACT inhaler Inhale 2 puffs into the lungs every 6 (six) hours as needed for wheezing or shortness of breath.(200/8=25) 06/06/21   Shadad, Firas N, MD  amitriptyline (ELAVIL) 75 MG tablet Take 1 tablet (75 mg total) by mouth at bedtime. 07/25/21   Vaslow, Zachary K, MD  B Complex-C (B-COMPLEX WITH VITAMIN C) tablet Take 1 tablet by mouth daily.     [provider]  baclofen (LIORESAL) 10 MG tablet Take 1 tablet (10 mg total) by mouth 3 (three) times daily. 08/16/21   Vaslow, Zachary K, MD  bisacodyl (DULCOLAX) 5 MG EC tablet Take 2 tablets (10 mg total) by mouth daily as needed for moderate constipation. 09/27/18   Akula, Vijaya, MD  Cholecalciferol (VITAMIN D) 50 MCG (2000 UT) CAPS Take 2,000 Units by mouth  daily.    [provider]  dexamethasone (DECADRON) 1 MG tablet Take 1 tablet (1 mg total) by mouth daily. 03/30/21   Vaslow, Zachary K, MD  diclofenac Sodium (VOLTAREN) 1 % GEL Apply 2 g topically 4 (four) times daily. 06/06/21   Shadad, Firas N, MD  DULoxetine (CYMBALTA) 30 MG capsule Take 1 capsule (30 mg total) by mouth daily. 06/06/21   Shadad, Firas N, MD  folic acid (FOLVITE) 1 MG tablet Take 1 tablet (1 mg total) by mouth daily. 07/24/16   [name], Deborah B, MD  gabapentin (NEURONTIN) 800 MG tablet TAKE 1 TABLET BY MOUTH 3 TIMES DAILY 12/17/21   Vaslow, Zachary K, MD  levETIRAcetam (KEPPRA) 1000 MG tablet Take 2 tablets (2,000 mg total) by mouth 2 (two) times daily. 07/28/21   Vaslow, Zachary K, MD  lidocaine (LIDODERM) 5 % Place 1 patch onto the skin daily. Remove & Discard patch within 12 hours or as directed by MD 12/13/21   Shadad, Firas N, MD  meloxicam (MOBIC) 7.5 MG tablet Take 7.5 mg by mouth 2 (two) times daily. 06/06/21   [provider]  Multiple Vitamin (MULTIVITAMIN) tablet Take 1 tablet by mouth daily. 09/10/19   Sheikh, Omair Latif, DO  NARCAN 4 MG/0.1ML LIQD nasal spray kit See admin instructions. 05/04/20   [provider]  nicotine (NICODERM CQ - DOSED IN MG/24 HOURS) 21 mg/24hr patch Place 1 patch (21 mg total) onto the skin daily. 09/20/19   [name], Deborah B, MD  oxyCODONE (  ROXICODONE) 15 MG immediate release tablet Take Every 4 hours as needed 01/31/22   Wyatt Portela, MD  pantoprazole (PROTONIX) 40 MG tablet Take 1 tablet(s) by mouth TWO TIMES DAILY 06/06/21   Wyatt Portela, MD  polyethylene glycol (MIRALAX / GLYCOLAX) 17 g packet Take 17 g by mouth daily as needed for moderate constipation. 09/01/19   Collene Gobble, MD  promethazine (PHENERGAN) 25 MG tablet Take 1 tablet (25 mg total) by mouth every 6 (six) hours as needed for nausea or vomiting. 09/01/19   Collene Gobble, MD  senna-docusate (SENOKOT-S) 8.6-50 MG tablet Take 2 tablets by mouth 2  (two) times daily. 09/27/18   Hosie Poisson, MD  thiamine (VITAMIN B-1) 100 MG tablet Take 1 tablet (100 mg total) by mouth daily. 07/24/16   [name], MD      Allergies    Latex    Review of Systems   Review of Systems  Gastrointestinal:  Positive for abdominal pain, nausea and vomiting.  All other systems reviewed and are negative.   Physical Exam Updated Vital Signs BP (!) 171/91   Pulse 70   Temp 98.4 F (36.9 C) (Oral)   Resp (!) 25   Ht 5' 10" (1.778 m)   Wt 74 kg   SpO2 96%   BMI 23.41 kg/m  Physical Exam Vitals and nursing note reviewed.  Constitutional:      General: He is not in acute distress.    Appearance: Normal appearance. He is normal weight. He is not ill-appearing, toxic-appearing or diaphoretic.  HENT:     Head: Normocephalic and atraumatic.  Cardiovascular:     Rate and Rhythm: Normal rate.  Pulmonary:     Effort: Pulmonary effort is normal. No respiratory distress.  Abdominal:     General: Abdomen is flat.     Palpations: Abdomen is soft.     Tenderness: There is abdominal tenderness.     Comments: Patient actively vomiting in room. No visualized hematemesis. Generalized abdominal pain present throughout.  Musculoskeletal:        General: Normal range of motion.     Cervical back: Normal range of motion.  Skin:    General: Skin is warm and dry.  Neurological:     General: No focal deficit present.     Mental Status: He is alert and oriented to person, place, and time.     Comments: Patient awake and alert and oriented x 3 moving all extremities equally with no deficits.  Psychiatric:        Mood and Affect: Mood normal.        Behavior: Behavior normal.     ED Results / Procedures / Treatments   Labs (all labs ordered are listed, but only abnormal results are displayed) Labs Reviewed  CBC WITH DIFFERENTIAL/PLATELET - Abnormal; Notable for the following components:      Result Value   WBC 10.7 (*)    Hemoglobin 10.5 (*)     HCT 33.7 (*)    MCV 76.1 (*)    MCH 23.7 (*)    RDW 16.1 (*)    Platelets 428 (*)    Neutro Abs 7.8 (*)    All other components within normal limits  COMPREHENSIVE METABOLIC PANEL - Abnormal; Notable for the following components:   Potassium 3.1 (*)    Glucose, Bld 116 (*)    AST 14 (*)    All other components within normal limits  URINALYSIS, ROUTINE W REFLEX  MICROSCOPIC - Abnormal; Notable for the following components:   APPearance CLOUDY (*)    Ketones, ur 20 (*)    All other components within normal limits  RESP PANEL BY RT-PCR (FLU A&B, COVID) ARPGX2  LIPASE, BLOOD  CBG MONITORING, ED    EKG None  Radiology No results found.  Procedures Procedures    Medications Ordered in ED Medications  iohexol (OMNIPAQUE) 300 MG/ML solution 100 mL (has no administration in time range)  sodium chloride (PF) 0.9 % injection (has no administration in time range)  ondansetron (ZOFRAN) injection 4 mg (4 mg Intravenous Given 02/07/22 1417)  sodium chloride 0.9 % bolus 1,000 mL (1,000 mLs Intravenous New Bag/Given 02/07/22 1421)    ED Course/ Medical Decision Making/ A&P                           Medical Decision Making Risk Prescription drug management.   This patient is a 51 y.o. male who presents to the ED for concern of nausea, vomiting, abdominal pain, this involves an extensive number of treatment options, and is a complaint that carries with it a high risk of complications and morbidity. The emergent differential diagnosis prior to evaluation includes, but is not limited to,  The differential diagnosis for generalized abdominal pain includes, but is not limited to AAA, gastroenteritis, appendicitis, Bowel obstruction, Bowel perforation. Gastroparesis, DKA, Hernia, Inflammatory bowel disease, mesenteric ischemia, pancreatitis, peritonitis SBP, volvulus.  This is not an exhaustive differential.   Past Medical History / Co-morbidities / Social History: Hx lung cancer with  brain mets  Physical Exam: Physical exam performed. The pertinent findings include: Patient actively vomiting with generalized abdominal tenderness  Lab Tests: I ordered, and personally interpreted labs.  The pertinent results include:  WBC 10.7, hgb 10.5 improved from previous. K 3.1, UA with ketonuria   Imaging Studies: I ordered imaging studies including CT abdomen pelvis. Imaging pending at shift change.     Medications: I ordered medication including zofran, morphine, fluids, potassium  for nausea/vomiting, pain, dehydration, hypokalemia. Reevaluation of the patient pending at shift change.    Disposition:  Patient presents today with complaints of nausea, vomiting, and abdominal pain. Currently vomiting on exam with generalized abdominal tenderness. Laboratory evaluation thus far is reassuring overall. CT pending at shift change. If normal and symptoms improved with medications, patient will likely be able to be discharged. Will need reassessment after CT results.   Care handoff to Cherlynn June, PA-C at shift change. Please see their note for further evaluation and dispo.   Final Clinical Impression(s) / ED Diagnoses Final diagnoses:  Generalized abdominal pain  Nausea and vomiting, unspecified vomiting type    Rx / DC Orders ED Discharge Orders     None         Bud Face, PA-C 02/08/22 0719    Jeanell Sparrow, DO 02/13/22 1421

## 2022-02-07 NOTE — ED Provider Notes (Signed)
Patient care taken over at shift handoff from outgoing provider.  Patient is a 51 year old male who presents to the emergency department today with complaints of nausea, vomiting, abdominal pain, and weakness.  Family states that for the past 2 to 3 days he has been extremely tired at home, barely getting out of bed.  They state this is abnormal for him.  They state that today the patient began having nausea and vomiting.  The patient endorses the same upon initial assessment.  The patient endorses generalized abdominal pain with no history of similar.  His last chemotherapy session was approximately 3 months ago and last radiation session was proxy 1 month ago.  He denies hematemesis, hematochezia, melena, and endorses a normal bowel movement earlier today.  Denies fevers, chills, headache, blurred vision, cough, congestion, chest pain, shortness of breath, diarrhea.  Patient with past medical history significant for lung cancer with metastasis to the brain, seizures, GERD Physical Exam  BP (!) 185/90   Pulse 80   Temp 100.1 F (37.8 C) (Oral)   Resp 18   Ht 5\' 10"  (1.778 m)   Wt 74 kg   SpO2 100%   BMI 23.41 kg/m   Physical Exam Vitals and nursing note reviewed.  HENT:     Head: Normocephalic.  Eyes:     Extraocular Movements: Extraocular movements intact.  Cardiovascular:     Rate and Rhythm: Normal rate.  Pulmonary:     Effort: Pulmonary effort is normal.  Neurological:     Mental Status: He is alert.     Procedures  .Critical Care  Performed by: Dorothyann Peng, PA-C Authorized by: Dorothyann Peng, PA-C   Critical care provider statement:    Critical care time (minutes):  30   Critical care time was exclusive of:  Separately billable procedures and treating other patients   Critical care was necessary to treat or prevent imminent or life-threatening deterioration of the following conditions:  CNS failure or compromise   Critical care was time spent personally by me on  the following activities:  Development of treatment plan with patient or surrogate, discussions with consultants, evaluation of patient's response to treatment, examination of patient, ordering and review of laboratory studies, ordering and review of radiographic studies, ordering and performing treatments and interventions, pulse oximetry, re-evaluation of patient's condition and review of old charts   Care discussed with: admitting provider     ED Course / MDM    Medical Decision Making Amount and/or Complexity of Data Reviewed Labs: ordered. Radiology: ordered.  Risk Prescription drug management. Decision regarding hospitalization.   I ordered and interpreted imaging including a CT abdomen pelvis. 1. Stomach is mildly distended with an air-fluid level which may be  secondary to recently ingested fluid versus gastroenteritis. No  bowel obstruction.  2. 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  3.  Emphysema (ICD10-J43.9).  4.  Aortic Atherosclerosis   I went to check on the patient after results were returned and the patient was somnolent and unable to be aroused.  Patient vomited while sleeping.  Patient had a decreased gag reflex.  My attending reassessed the patient with me and was able to arouse the patient with painful stimuli.  The patient then became more alert and stayed alert.  Head CT was ordered and interpreted. 1. There is thickening of the left frontal gyrus and edema in the  adjacent white matter. Edema has progressed since 2021  but is  similar to the MRI of 11/03/2021.  2. Thickening of the right parietal cortex with vasogenic edema in  the white matter. Edema has progressed since the recent MRI possibly  due to tumor growth.  3. Hypodensity right cerebellum corresponding to a known enhancing  lesion on MRI.  4. No acute hemorrhage or hydrocephalus.  I agree with radiologist findings on  the CT of the head and abdomen.  The patient is now somewhat alert and able to answer questions.  He states he feels miserable.  He is unable to elaborate on what makes him feel bad.  He is intermittently somewhat confused.  When alert he complains of continued nausea and feeling very tired.  At this time I feel the patient would benefit from admission for further evaluation and management.  I did order a loading dose of Keppra and had a Keppra level checked as the patient has a history of seizures and takes Keppra daily.  He endorses in his family agrees that the patient has been taking his medication as prescribed.  Unclear etiology of patient's change in mental status today.  Question if it could have been seizure versus other etiology, likely related to brain metastases.  I requested consultation with the hospitalist, Dr.Crosley, who agreed to see the patient and requested that I also consult with neurology for further recommendations.  Neurology consult was also placed. I spoke with Dr. Cheral Marker who recommended adding Vimpat as a second anticonvulsant and recommended further discussion with the patient's neuro oncologist after MRI results     Ronny Bacon 02/07/22 2050    Regan Lemming, MD 02/08/22 2129

## 2022-02-07 NOTE — ED Notes (Signed)
Patient has returned from MRI.  He continues to have pain in his lower abdomen.  Patient also noted to have fever.  Will inform admitting MD

## 2022-02-07 NOTE — ED Triage Notes (Addendum)
Pt bib ems from home x 3 days family called ems after pt seemed more lethargic. Also c/o bilateral chronic leg pain. Pt hx brain cancer.

## 2022-02-07 NOTE — ED Provider Triage Note (Signed)
Emergency Medicine Provider Triage Evaluation Note  Eric Mason , a 51 y.o. male  was evaluated in triage.  Pt complains of abdominal pain with nausea and vomiting which began today.  Patient was brought in by EMS.  Family states the patient seems more lethargic than usual.  Patient has a history of brain cancer.  Patient currently denies constipation, diarrhea, shortness of breath, chest pain, headache.  Endorses abdominal pain, generalized, nausea and vomiting  Review of Systems  Positive: As above Negative: As above  Physical Exam  BP (!) 164/93 (BP Location: Left Arm)   Pulse 79   Temp 98.4 F (36.9 C) (Oral)   Resp 15   Ht 5\' 10"  (1.778 m)   Wt 74 kg   SpO2 95%   BMI 23.41 kg/m  Gen:   Awake, no distress   Resp:  Normal effort  MSK:   Moves extremities without difficulty  Other:  Generalized abdominal tenderness  Medical Decision Making  Medically screening exam initiated at 12:15 PM.  Appropriate orders placed.  Eric Mason was informed that the remainder of the evaluation will be completed by another provider, this initial triage assessment does not replace that evaluation, and the importance of remaining in the ED until their evaluation is complete.     Dorothyann Peng, PA-C 02/07/22 1218
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
## 2022-02-07 NOTE — ED Notes (Signed)
Patient is resting on his left side.  Sister is at the bedside.  He has not had any repeat vomiting.  Keppra infusion has completed.  Patient and family aware that he is waiting for MRI

## 2022-02-07 NOTE — ED Notes (Signed)
Patient is complaining of pain in his lower abdomen.  Family has returned to bedside.  Will inform the MD that they are available now for admission information

## 2022-02-08 ENCOUNTER — Inpatient Hospital Stay (HOSPITAL_COMMUNITY): Payer: Medicaid Other

## 2022-02-08 DIAGNOSIS — R112 Nausea with vomiting, unspecified: Secondary | ICD-10-CM

## 2022-02-08 DIAGNOSIS — R509 Fever, unspecified: Secondary | ICD-10-CM | POA: Insufficient documentation

## 2022-02-08 DIAGNOSIS — M87052 Idiopathic aseptic necrosis of left femur: Secondary | ICD-10-CM | POA: Insufficient documentation

## 2022-02-08 DIAGNOSIS — G9340 Encephalopathy, unspecified: Secondary | ICD-10-CM

## 2022-02-08 DIAGNOSIS — R627 Adult failure to thrive: Secondary | ICD-10-CM | POA: Insufficient documentation

## 2022-02-08 LAB — CBC WITH DIFFERENTIAL/PLATELET
Abs Immature Granulocytes: 0.03 10*3/uL (ref 0.00–0.07)
Basophils Absolute: 0 10*3/uL (ref 0.0–0.1)
Basophils Relative: 0 %
Eosinophils Absolute: 0 10*3/uL (ref 0.0–0.5)
Eosinophils Relative: 0 %
HCT: 40 % (ref 39.0–52.0)
Hemoglobin: 12.6 g/dL — ABNORMAL LOW (ref 13.0–17.0)
Immature Granulocytes: 0 %
Lymphocytes Relative: 28 %
Lymphs Abs: 2.2 10*3/uL (ref 0.7–4.0)
MCH: 23.5 pg — ABNORMAL LOW (ref 26.0–34.0)
MCHC: 31.5 g/dL (ref 30.0–36.0)
MCV: 74.5 fL — ABNORMAL LOW (ref 80.0–100.0)
Monocytes Absolute: 0.3 10*3/uL (ref 0.1–1.0)
Monocytes Relative: 3 %
Neutro Abs: 5.5 10*3/uL (ref 1.7–7.7)
Neutrophils Relative %: 69 %
Platelets: 494 10*3/uL — ABNORMAL HIGH (ref 150–400)
RBC: 5.37 MIL/uL (ref 4.22–5.81)
RDW: 16 % — ABNORMAL HIGH (ref 11.5–15.5)
WBC: 8 10*3/uL (ref 4.0–10.5)
nRBC: 0 % (ref 0.0–0.2)

## 2022-02-08 LAB — BASIC METABOLIC PANEL
Anion gap: 13 (ref 5–15)
BUN: 10 mg/dL (ref 6–20)
CO2: 23 mmol/L (ref 22–32)
Calcium: 9.2 mg/dL (ref 8.9–10.3)
Chloride: 97 mmol/L — ABNORMAL LOW (ref 98–111)
Creatinine, Ser: 0.69 mg/dL (ref 0.61–1.24)
GFR, Estimated: >60 mL/min (ref >60)
Glucose, Bld: 125 mg/dL — ABNORMAL HIGH (ref 70–99)
Potassium: 3.5 mmol/L (ref 3.5–5.1)
Sodium: 133 mmol/L — ABNORMAL LOW (ref 135–145)

## 2022-02-08 LAB — FOLATE: Folate: 19.5 ng/mL (ref >5.9)

## 2022-02-08 LAB — MAGNESIUM: Magnesium: 1.5 mg/dL — ABNORMAL LOW (ref 1.7–2.4)

## 2022-02-08 LAB — VITAMIN B12: Vitamin B-12: 887 pg/mL (ref 180–914)

## 2022-02-08 LAB — AMMONIA: Ammonia: 34 umol/L (ref 9–35)

## 2022-02-08 MED ORDER — LABETALOL HCL 5 MG/ML IV SOLN: 5.0000 mg | INTRAVENOUS | Status: DC | PRN

## 2022-02-08 MED ORDER — OXYCODONE HCL 5 MG PO TABS
5.0000 mg | ORAL_TABLET | Freq: Once | ORAL | Status: AC
  Administered 2022-02-08: 5 mg via ORAL
  Filled 2022-02-08: qty 1

## 2022-02-08 MED ORDER — SODIUM CHLORIDE 0.9 % IV SOLN: INTRAVENOUS | Status: DC

## 2022-02-08 MED ORDER — MAGNESIUM SULFATE 2 GM/50ML IV SOLN
2.0000 g | Freq: Once | INTRAVENOUS | Status: AC
  Administered 2022-02-08: 2 g via INTRAVENOUS
  Filled 2022-02-08: qty 50

## 2022-02-08 MED ORDER — LEVETIRACETAM IN NACL 1000 MG/100ML IV SOLN
1000.0000 mg | Freq: Once | INTRAVENOUS | Status: AC
  Administered 2022-02-08: 1000 mg via INTRAVENOUS
  Filled 2022-02-08: qty 100

## 2022-02-08 MED ORDER — LORAZEPAM 2 MG/ML IJ SOLN
1.0000 mg | INTRAMUSCULAR | Status: DC | PRN
  Administered 2022-02-08 – 2022-02-10 (×4): 1 mg via INTRAVENOUS
  Filled 2022-02-08 (×5): qty 1

## 2022-02-08 MED ORDER — SODIUM CHLORIDE 0.9 % IV SOLN
100.0000 mg | Freq: Two times a day (BID) | INTRAVENOUS | Status: DC
  Administered 2022-02-08 – 2022-02-10 (×4): 100 mg via INTRAVENOUS
  Filled 2022-02-08 (×6): qty 10

## 2022-02-08 MED ORDER — SODIUM CHLORIDE 0.9 % IV SOLN
2000.0000 mg | Freq: Two times a day (BID) | INTRAVENOUS | Status: DC
  Administered 2022-02-08 – 2022-02-10 (×4): 2000 mg via INTRAVENOUS
  Filled 2022-02-08 (×5): qty 20

## 2022-02-08 MED ORDER — POTASSIUM CHLORIDE IN NACL 20-0.9 MEQ/L-% IV SOLN
INTRAVENOUS | Status: DC
  Filled 2022-02-08: qty 1000

## 2022-02-08 MED ORDER — ONDANSETRON HCL 4 MG/2ML IJ SOLN: 4.0000 mg | Freq: Four times a day (QID) | INTRAMUSCULAR | Status: DC | PRN

## 2022-02-08 MED ORDER — SODIUM CHLORIDE 0.9 % IV SOLN
12.5000 mg | Freq: Once | INTRAVENOUS | Status: AC | PRN
  Administered 2022-02-08: 12.5 mg via INTRAVENOUS
  Filled 2022-02-08: qty 12.5

## 2022-02-08 MED ORDER — SODIUM CHLORIDE 0.9 % IV SOLN
12.5000 mg | Freq: Four times a day (QID) | INTRAVENOUS | Status: AC | PRN
  Administered 2022-02-08: 12.5 mg via INTRAVENOUS
  Filled 2022-02-08: qty 12.5

## 2022-02-08 MED ORDER — ONDANSETRON HCL 4 MG/2ML IJ SOLN
INTRAMUSCULAR | Status: AC
  Filled 2022-02-08: qty 2

## 2022-02-08 MED ORDER — LABETALOL HCL 5 MG/ML IV SOLN
10.0000 mg | INTRAVENOUS | Status: DC | PRN
  Administered 2022-02-08 (×2): 10 mg via INTRAVENOUS
  Filled 2022-02-08 (×4): qty 4

## 2022-02-08 MED ORDER — MORPHINE SULFATE (PF) 2 MG/ML IV SOLN
2.0000 mg | INTRAVENOUS | Status: DC | PRN
  Administered 2022-02-08 – 2022-02-09 (×6): 2 mg via INTRAVENOUS
  Filled 2022-02-08 (×6): qty 1

## 2022-02-08 NOTE — TOC Progression Note (Signed)
Transition of Care [facility]) - Progression Note    Patient Details  Name: [name] MRN: [id number] Date of Birth: 04/10/1970  Transition of Care [facility]) CM/SW [place], RN Phone Number:[phone]  02/08/2022, 3:16 PM  Clinical Narrative:     Transition of Care [facility]) Screening Note   Patient Details  Name: [name] Date of Birth: April 07, 1970   Transition of Care [facility]) CM/SW Contact:    Angelita Ingles, RN Phone Number: 02/08/2022, 3:17 PM    Transition of Care Department [facility]) has reviewed patient and no TOC needs have been identified at this time. We will continue to monitor patient advancement through interdisciplinary progression rounds. If new patient transition needs arise, please place a TOC consult.          Expected Discharge Plan and Services                                                 Social Determinants of Health (SDOH) Interventions    Readmission Risk Interventions     No data to display

## 2022-02-08 NOTE — ED Notes (Signed)
Patient has been sleeping off and on.  He denies any need for pain medications at this time.  Family remains at the bedside.

## 2022-02-08 NOTE — Progress Notes (Signed)
  PROGRESS NOTE  Checked in on patient this afternoon.  He is alert, but appears to be anxious and restless.  RN is starting Phenergan for complaint of nausea.  Met patient's sister outside of patient's room.  I discussed my concern for patient's decline, poor oral intake.  I encouraged patient's family to discuss patient's goals of care and code status.   Dessa Phi, DO [facility] 02/08/2022, 4:14 PM  Available via Epic secure chat 7am-7pm After these hours, please refer to coverage provider listed on [email or web address]

## 2022-02-08 NOTE — Progress Notes (Signed)
Unit RN at bedside and states port-a-cath has been accessed. Will consult IV team if further assistance needed

## 2022-02-08 NOTE — ED Notes (Signed)
Patient actively vomiting and c/o feeling hot, nurse offered liquids and fan, patient brother remains at bedside.

## 2022-02-08 NOTE — Progress Notes (Signed)
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  He had stereotactic radiation completed 2018, 2019, 2020 and recently completed again completed radiation on November 23, 2021 due to progression in the right posterior temporal lobe and new lesions in the right occipital lobe. For the past 2 days he has been in bed which per family is unusual for him.  Has also been having nausea, vomiting as well as confusion and hallucinations.  CT abdomen pelvis: Stomach is mildly distended with an air-fluid level which may be secondary to recently ingested fluid versus gastroenteritis. No bowel obstruction.  MRI brain:  Decreased size of left frontal and right cerebellar lesions, consistent with treatment response. Unchanged size of clustered right temporal lesions. Punctate enhancement in the right occipital lobe is unchanged. No new lesions. Unchanged vasogenic edema pattern surrounding the lesions.  New events last 24 hours / Subjective: Patient seen in the ER with brother at bedside.  Patient lives with brother at home.  Has 2 adult children, is not married.  Brother states that patient has had poor oral intake over the last several weeks.  He typically is able to eat 1 good meal a day and is able to keep down some Gatorade, about 2 bottles a day he thinks.  Since Monday however patient has not been able to eat anything at all.  Has been complaining of not feeling well, had episode of vomiting 11/29 as well as lower abdominal pain.  No sick contacts. Had a fever 101 last night. Also complains of left inner leg/groin pain ongoing for several months.   Assessment & Plan:  Principal Problem:   Nausea and vomiting Active Problems:   Seizure (HCC)    Malignant neoplasm metastatic to brain (HCC)   Tobacco abuse   Lung cancer ([place])   Malignant neoplasm metastatic to bone (HCC)   Vasogenic brain edema (HCC)   Fever   Failure to thrive in adult   Hypomagnesemia   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HCC)   Nausea, vomiting, abdominal pain -CT abdomen pelvis showing mildly distended stomach, no bowel obstruction -?Gastroenteritis -Supportive care, IVF, antiemetic  -Full liquid diet   Fever -Unclear etiology, ?abdominal -Covid, influenza negative -CXR negative  -UA negative  -Blood cultures pending   Stage IV poorly differentiated lung cancer with mets to brain, bone -MRI brain decreased/unchanged size of previously seen lesions  -Followed by Dr. Alen Blew, Dr. Mickeal Skinner.  I sent them a secure chat with patient's admission status to keep them in the loop  -Continue IV keppra, vimpat. 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    DVT prophylaxis:  SCDs Start: 02/07/22 2223  Code Status: Full Family Communication: Brother at bedside  Disposition Plan:  Status is: Inpatient Remains inpatient appropriate because: IVF, work up    Antimicrobials:  Anti-infectives (From admission, onward)    None        Objective: Vitals:   02/08/22 0830 02/08/22 0900 02/08/22 1130 02/08/22 1305  BP: (!) 174/115 (!) 154/101 126/82 [name])  153/86  Pulse: (!) 117 (!) 113 94 77  Resp: (!) 24 19 (!) 26 16  Temp:    98.7 F (37.1 C)  TempSrc:    Oral  SpO2: 96% 100% 96% 99%  Weight:      Height:        Intake/Output Summary (Last 24 hours) at 02/08/2022 1410 Last data filed at 02/08/2022 1138 Gross per 24 hour  Intake 1235.23 ml  Output --  Net 1235.23 ml   Filed Weights   02/07/22  1155  Weight: 74 kg    Examination:  General exam: Appears uncomfortable, miserable  Respiratory system: Clear to auscultation. Respiratory effort normal. No respiratory distress. No conversational dyspnea.  Cardiovascular system: S1 & S2 heard, tachycardic regular rhythm  Gastrointestinal system: Abdomen is nondistended, soft, tender to palpation epigastrium Central nervous system: Alert and oriented. No focal neurological deficits. Speech clear.  Extremities: Symmetric in appearance, tender to palpation left inner thigh, groin Skin: No rashes, lesions or ulcers on exposed skin  Psychiatry: Judgement and insight appear normal. Mood & affect appropriate.   Data Reviewed: I have personally reviewed following labs and imaging studies  CBC: Recent Labs  Lab 02/07/22 1259 02/08/22 0525  WBC 10.7* 8.0  NEUTROABS 7.8* 5.5  HGB 10.5* 12.6*  HCT 33.7* 40.0  MCV 76.1* 74.5*  PLT 428* 401*   Basic Metabolic Panel: Recent Labs  Lab 02/07/22 1259 02/08/22 0525  NA 137 133*  K 3.1* 3.5  CL 104 97*  CO2 24 23  GLUCOSE 116* 125*  BUN 9 10  CREATININE 0.76 0.69  CALCIUM 9.0 9.2  MG  --  1.5*   GFR: Estimated Creatinine Clearance: 112.8 mL/min (by C-G formula based on SCr of 0.69 mg/dL). Liver Function Tests: Recent Labs  Lab 02/07/22 1259  AST 14*  ALT 10  ALKPHOS 88  BILITOT 0.4  PROT 8.1  ALBUMIN 3.5   Recent Labs  Lab 02/07/22 1259  LIPASE 32   Recent Labs  Lab 02/08/22 0525  AMMONIA 34   Coagulation Profile: No results for input(s): "INR", "PROTIME" in the last 168 hours. Cardiac Enzymes: No results for input(s): "CKTOTAL", "CKMB", "CKMBINDEX", "TROPONINI" in the last 168 hours. BNP (last 3 results) No results for input(s): "PROBNP" in the last 8760 hours. HbA1C: No results for input(s): "HGBA1C" in the last 72 hours. CBG: Recent Labs  Lab 02/07/22 1159 02/07/22 1652  GLUCAP 98 87   Lipid Profile: No results for input(s): "CHOL", "HDL", "LDLCALC",  "TRIG", "CHOLHDL", "LDLDIRECT" in the last 72 hours. Thyroid Function Tests: No results for input(s): "TSH", "T4TOTAL", "FREET4", "T3FREE", "THYROIDAB" in the last 72 hours. Anemia Panel: Recent Labs    02/08/22 0525  [id number] 034  FOLATE 19.5   Sepsis Labs: No results for input(s): "PROCALCITON", "LATICACIDVEN" in the last 168 hours.  Recent Results (from the past 240 hour(s))  Resp Panel by RT-PCR (Flu A&B, Covid) Anterior Nasal Swab     Status: None   Collection Time: 02/07/22  3:19 PM   Specimen: Anterior Nasal Swab  Result Value Ref Range Status   SARS Coronavirus 2 by RT PCR NEGATIVE NEGATIVE Final    Comment: (NOTE) SARS-CoV-2 target nucleic acids are NOT DETECTED.  The SARS-CoV-2 RNA is generally detectable in upper respiratory specimens during the acute phase of infection. The lowest concentration of SARS-CoV-2 viral copies this assay can detect is 138 copies/mL. A negative result does not preclude SARS-Cov-2 infection and should not be used as the sole basis  for treatment or other patient management decisions. A negative result may occur with  improper specimen collection/handling, submission of specimen other than nasopharyngeal swab, presence of viral mutation(s) within the areas targeted by this assay, and inadequate number of viral copies(<138 copies/mL). A negative result must be combined with clinical observations, patient history, and epidemiological information. The expected result is Negative.  Fact Sheet for Patients:  EntrepreneurPulse.com.au  Fact Sheet for Healthcare Providers:  IncredibleEmployment.be  This test is no t yet approved or cleared by the [place] FDA and  has been authorized for detection and/or diagnosis of SARS-CoV-2 by FDA under an Emergency Use Authorization (EUA). This EUA will remain  in effect (meaning this test can be used) for the duration of the COVID-19 declaration under Section  564(b)(1) of the Act, 21 U.S.C.section 360bbb-3(b)(1), unless the authorization is terminated  or revoked sooner.       Influenza A by PCR NEGATIVE NEGATIVE Final   Influenza B by PCR NEGATIVE NEGATIVE Final    Comment: (NOTE) The Xpert Xpress SARS-CoV-2/FLU/RSV plus assay is intended as an aid in the diagnosis of influenza from Nasopharyngeal swab specimens and should not be used as a sole basis for treatment. Nasal washings and aspirates are unacceptable for Xpert Xpress SARS-CoV-2/FLU/RSV testing.  Fact Sheet for Patients: EntrepreneurPulse.com.au  Fact Sheet for Healthcare Providers: IncredibleEmployment.be  This test is not yet approved or cleared by the [place] FDA and has been authorized for detection and/or diagnosis of SARS-CoV-2 by FDA under an Emergency Use Authorization (EUA). This EUA will remain in effect (meaning this test can be used) for the duration of the COVID-19 declaration under Section 564(b)(1) of the Act, 21 U.S.C. section 360bbb-3(b)(1), unless the authorization is terminated or revoked.  Performed at [facility], [place] [address] [place], [place] [address]       Radiology Studies: DG HIP UNILAT WITH PELVIS 2-3 VIEWS LEFT  Result Date: 02/08/2022 CLINICAL DATA:  Left hip pain EXAM: DG HIP (WITH OR WITHOUT PELVIS) 3V LEFT COMPARISON:  None Available. FINDINGS: No evidence of hip fracture or dislocation. Bilateral joint space loss with subchondral patchy sclerosis and lucencies. Flattening of the bilateral femoral heads. Soft tissues are unremarkable. 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 Electronically Signed   By: Yetta Glassman M.D.   On: 02/08/2022 09:07   DG CHEST PORT 1 VIEW  Result Date: 02/08/2022 CLINICAL DATA:  51 year old male with history of nausea and vomiting. EXAM: PORTABLE CHEST 1 VIEW COMPARISON:  Chest x-ray  07/14/2019. FINDINGS: Right internal jugular single-lumen Port-A-Cath with tip terminating at the superior cavoatrial junction. Lung volumes are normal. Diffuse interstitial prominence and mild diffuse peribronchial cuffing, similar to the prior examination, suggesting a background of chronic bronchitis. No confluent consolidative airspace disease. No pleural effusions. No pneumothorax. No evidence of pulmonary edema. Heart size is normal. The patient is rotated to the right on today's exam, resulting in distortion of the mediastinal contours and reduced diagnostic sensitivity and specificity for mediastinal pathology. IMPRESSION: 1. No definite radiographic evidence of acute cardiopulmonary disease. 2. The appearance of the lungs suggests chronic bronchitis, as above. Electronically Signed   By: Vinnie Langton M.D.   On: 02/08/2022 05:08   MR BRAIN W WO CONTRAST  Result Date: 02/07/2022 CLINICAL DATA:  Intracranial metastatic disease. EXAM: MRI HEAD WITHOUT AND WITH CONTRAST TECHNIQUE: Multiplanar, multiecho pulse sequences of the brain and surrounding structures were obtained without and with intravenous contrast. CONTRAST:  7.55mL GADAVIST GADOBUTROL 1 MMOL/ML IV SOLN COMPARISON:  11/03/2021 FINDINGS: Brain: No acute infarct or acute hemorrhage. There are multiple areas of vasogenic edema again seen within the left frontal operculum, right temporal lobe and right cerebellum. These are unchanged. Comparison of the contrast-enhancing lesions is as follows: 1. Right cerebellum 8 mm, previously 10 mm image 48 2. Posterior right temporal cluster, 10 mm, unchanged image 78 3. Punctate right occipital enhancement, unchanged image 65 4. Left frontal lobe, 12 mm, previously 25 mm, image 90 There are no new lesions.  No midline shift or hydrocephalus. Vascular: Normal flow voids. Skull and upper cervical spine: Normal marrow signal. Sinuses/Orbits: Negative. Other: None. IMPRESSION: 1. Decreased size of left frontal  and right cerebellar lesions, consistent with treatment response. 2. Unchanged size of clustered right temporal lesions. Punctate enhancement in the right occipital lobe is unchanged. 3. No new lesions. 4. Unchanged vasogenic edema pattern surrounding the lesions. Electronically Signed   By: Ulyses Jarred M.D.   On: 02/07/2022 21:46   CT Head Wo Contrast  Result Date: 02/07/2022 CLINICAL DATA:  Mental status change. History of lung cancer with metastatic disease to brain. EXAM: CT HEAD WITHOUT CONTRAST TECHNIQUE: Contiguous axial images were obtained from the base of the skull through the vertex without intravenous contrast. RADIATION DOSE REDUCTION: This exam was performed according to the departmental dose-optimization program which includes automated exposure control, adjustment of the mA and/or kV according to patient size and/or use of iterative reconstruction technique. COMPARISON:  CT head 08/17/2019.  MRI head with contrast 11/03/2021 FINDINGS: Brain: 3 x 5 mm calcification in the left anterior insula was present previously. Additional small calcifications in the vicinity have improved. There is thickening of the adjacent left frontal gyrus and edema in the adjacent white matter. Edema has progressed since 2021 but is similar to the MRI of 11/03/2021. Hypodensity right cerebellum corresponding to a known enhancing lesion on MRI. Thickening of the right parietal cortex with vasogenic edema in the white matter. Edema appears progressive since the prior MRI. No acute hemorrhage or hydrocephalus.  No midline shift. Vascular: Negative for hyperdense vessel Skull: Right parietal craniotomy.  No acute skeletal abnormality. Sinuses/Orbits: Paranasal sinuses clear.  Negative orbit Other: None IMPRESSION: 1. There is thickening of the left frontal gyrus and edema in the adjacent white matter. Edema has progressed since 2021 but is similar to the MRI of 11/03/2021. 2. Thickening of the right parietal cortex with  vasogenic edema in the white matter. Edema has progressed since the recent MRI possibly due to tumor growth. 3. Hypodensity right cerebellum corresponding to a known enhancing lesion on MRI. 4. No acute hemorrhage or hydrocephalus. 5. MRI brain without with contrast recommended Electronically Signed   By: Franchot Gallo M.D.   On: 02/07/2022 16:54   CT Abdomen Pelvis W Contrast  Result Date: 02/07/2022 CLINICAL DATA:  Abdominal pain.  Nausea and vomiting. EXAM: CT ABDOMEN AND PELVIS WITH CONTRAST TECHNIQUE: Multidetector CT imaging of the abdomen and pelvis was performed using the standard protocol following bolus administration of intravenous contrast. RADIATION DOSE REDUCTION: This exam was performed according to the departmental dose-optimization program which includes automated exposure control, adjustment of the mA and/or kV according to patient size and/or use of iterative reconstruction technique. CONTRAST:  174mL OMNIPAQUE IOHEXOL 300 MG/ML  SOLN COMPARISON:  01/14/2022 FINDINGS: Lower chest: No acute abnormality. Centrilobular and paraseptal emphysema. Hepatobiliary: No focal liver abnormality is seen. No gallstones, gallbladder wall thickening, or biliary dilatation. Pancreas: Unremarkable. No  pancreatic ductal dilatation or surrounding inflammatory changes. Spleen: Normal in size without focal abnormality. Adrenals/Urinary Tract: Adrenal glands are unremarkable. Kidneys are normal, without renal calculi, focal lesion, or hydronephrosis. Bladder is unremarkable. Stomach/Bowel: Stomach is mildly distended with an air-fluid level which may be secondary to recently ingested fluid versus gastroenteritis. No evidence of bowel wall thickening, distention, or inflammatory changes. Appendix is normal. Vascular/Lymphatic: Normal caliber abdominal aorta with mild atherosclerosis. No lymphadenopathy. Reproductive: Prostate is unremarkable. Other: No abdominal wall hernia or abnormality. No abdominopelvic  ascites. Musculoskeletal: No acute osseous abnormality. No aggressive osseous lesion. 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 IMPRESSION: 1. Stomach is mildly distended with an air-fluid level which may be secondary to recently ingested fluid versus gastroenteritis. No bowel obstruction. 2. 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 3.  Emphysema (ICD10-J43.9). 4.  Aortic Atherosclerosis (ICD10-I70.0). Electronically Signed   By: Kathreen Devoid M.D.   On: 02/07/2022 15:04      Scheduled Meds:  dexamethasone (DECADRON) injection  3 mg Intravenous Q12H   lacosamide  100 mg Oral BID   nicotine  7 mg Transdermal Daily   Continuous Infusions:  sodium chloride 125 mL/hr at 02/08/22 0845   levETIRAcetam     promethazine (PHENERGAN) injection (IM or IVPB)       LOS: 1 day     Dessa Phi, DO [facility] 02/08/2022, 2:10 PM   Available via Epic secure chat 7am-7pm After these hours, please refer to coverage provider listed on [email or web address]

## 2022-02-09 ENCOUNTER — Encounter (HOSPITAL_COMMUNITY): Payer: Self-pay | Admitting: Family Medicine

## 2022-02-09 DIAGNOSIS — Z7189 Other specified counseling: Secondary | ICD-10-CM

## 2022-02-09 DIAGNOSIS — C7800 Secondary malignant neoplasm of unspecified lung: Secondary | ICD-10-CM

## 2022-02-09 DIAGNOSIS — Z515 Encounter for palliative care: Secondary | ICD-10-CM

## 2022-02-09 LAB — CBC
HCT: 33.7 % — ABNORMAL LOW (ref 39.0–52.0)
Hemoglobin: 10.8 g/dL — ABNORMAL LOW (ref 13.0–17.0)
MCH: 23.7 pg — ABNORMAL LOW (ref 26.0–34.0)
MCHC: 32 g/dL (ref 30.0–36.0)
MCV: 74.1 fL — ABNORMAL LOW (ref 80.0–100.0)
Platelets: 426 10*3/uL — ABNORMAL HIGH (ref 150–400)
RBC: 4.55 MIL/uL (ref 4.22–5.81)
RDW: 15.9 % — ABNORMAL HIGH (ref 11.5–15.5)
WBC: 10.6 10*3/uL — ABNORMAL HIGH (ref 4.0–10.5)
nRBC: 0 % (ref 0.0–0.2)

## 2022-02-09 LAB — BASIC METABOLIC PANEL
Anion gap: 10 (ref 5–15)
BUN: 16 mg/dL (ref 6–20)
CO2: 21 mmol/L — ABNORMAL LOW (ref 22–32)
Calcium: 8.8 mg/dL — ABNORMAL LOW (ref 8.9–10.3)
Chloride: 101 mmol/L (ref 98–111)
Creatinine, Ser: 0.55 mg/dL — ABNORMAL LOW (ref 0.61–1.24)
GFR, Estimated: >60 mL/min (ref >60)
Glucose, Bld: 117 mg/dL — ABNORMAL HIGH (ref 70–99)
Potassium: 3.3 mmol/L — ABNORMAL LOW (ref 3.5–5.1)
Sodium: 132 mmol/L — ABNORMAL LOW (ref 135–145)

## 2022-02-09 LAB — HIV ANTIBODY (ROUTINE TESTING W REFLEX): HIV Screen 4th Generation wRfx: NONREACTIVE

## 2022-02-09 LAB — MAGNESIUM: Magnesium: 1.8 mg/dL (ref 1.7–2.4)

## 2022-02-09 LAB — LEVETIRACETAM LEVEL: Levetiracetam Lvl: 21.7 ug/mL (ref 10.0–40.0)

## 2022-02-09 MED ORDER — ENSURE ENLIVE PO LIQD
237.0000 mL | Freq: Two times a day (BID) | ORAL | Status: DC
  Administered 2022-02-09: 237 mL via ORAL

## 2022-02-09 MED ORDER — ADULT MULTIVITAMIN W/MINERALS CH
1.0000 | ORAL_TABLET | Freq: Every day | ORAL | Status: DC
  Administered 2022-02-09: 1 via ORAL
  Filled 2022-02-09: qty 1

## 2022-02-09 MED ORDER — OXYCODONE HCL 5 MG PO TABS
5.0000 mg | ORAL_TABLET | Freq: Once | ORAL | Status: AC
  Administered 2022-02-09: 5 mg via ORAL
  Filled 2022-02-09: qty 1

## 2022-02-09 MED ORDER — AMLODIPINE BESYLATE 5 MG PO TABS
5.0000 mg | ORAL_TABLET | Freq: Every day | ORAL | Status: DC
  Administered 2022-02-09 – 2022-02-10 (×2): 5 mg via ORAL
  Filled 2022-02-09 (×2): qty 1

## 2022-02-09 MED ORDER — CHLORHEXIDINE GLUCONATE CLOTH 2 % EX PADS
6.0000 | MEDICATED_PAD | Freq: Every day | CUTANEOUS | Status: DC
  Administered 2022-02-09 – 2022-02-10 (×2): 6 via TOPICAL

## 2022-02-09 MED ORDER — POTASSIUM CHLORIDE CRYS ER 20 MEQ PO TBCR
40.0000 meq | EXTENDED_RELEASE_TABLET | Freq: Once | ORAL | Status: AC
  Administered 2022-02-09: 40 meq via ORAL
  Filled 2022-02-09: qty 2

## 2022-02-09 MED ORDER — OXYCODONE HCL 5 MG PO TABS
5.0000 mg | ORAL_TABLET | ORAL | Status: DC | PRN
  Administered 2022-02-09 – 2022-02-10 (×4): 10 mg via ORAL
  Filled 2022-02-09 (×5): qty 2

## 2022-02-09 NOTE — Progress Notes (Signed)
Initial Nutrition Assessment  INTERVENTION:   -Ensure Plus High Protein po BID, each supplement provides 350 kcal and 20 grams of protein.   -Multivitamin with minerals daily  NUTRITION DIAGNOSIS:   Inadequate oral intake related to nausea, vomiting as evidenced by per patient/family report.  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  He had stereotactic radiation completed 2018, 2019, 2020 and recently completed again completed radiation on November 23, 2021 due to progression in the right posterior temporal lobe and new lesions in the right occipital lobe. For the past 2 days he has been in bed which per family is unusual for him.  Has also been having nausea, vomiting as well as confusion and hallucinations.  Patient in room, sister at bedside. Pt not feeling too well today. States he has not eaten anything this morning. Pt has had decreased PO intakes over the past few weeks. Consuming ~ 1 meal a day and drinking primarily Gatorades. Pt last ate a cheese steak sub on 11/27. Has been having N/V this week as well. Pt agreeable to drinking Ensure ,vanilla flavor. Pt was on full liquids, now on  regular diet.   Pt reports UBW is ~160 lbs. Current weight recorded: 163 lbs. Per weight records, pt has lost 14 lbs since 1/31 (7% wt loss  x 10 months insignificant for time frame).   Medications: KLOR-CON  Labs reviewed: CBGs: 87 Low Na Low K   NUTRITION - FOCUSED PHYSICAL EXAM:  No depletions noted.  Diet Order:   Diet Order             Diet regular Room service appropriate? Yes; Fluid consistency: Thin  Diet effective now                    EDUCATION NEEDS:   No education needs have been identified at this time  Skin:  Skin Assessment: Reviewed RN Assessment  Last BM:  11/30 -type 7  Height:   Ht Readings from Last 1 Encounters:  02/07/22 5\' 10"  (1.778 m)    Weight:   Wt Readings from Last 1 Encounters:  02/07/22 74 kg    BMI:  Body mass index is 23.41 kg/m.  Estimated Nutritional Needs:   Kcal:  2000-2200  Protein:  100-115g  Fluid:  2L/day  Clayton Bibles, MS, RD, LDN Inpatient Clinical Dietitian Contact information available via Amion

## 2022-02-09 NOTE — Plan of Care (Signed)
Discussed with patient in front of family plan of care for the evening, pain management and physical therapy with some teach back displayed.  Patient was anxious and wanted anxiety medication at bedtime.  Brother is staying the night,  Problem: Education: Goal: Knowledge of General Education information will improve Description: Including pain rating scale, medication(s)/side effects and non-pharmacologic comfort measures Outcome: Progressing

## 2022-02-09 NOTE — [place] Notes (Signed)
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  He had stereotactic radiation completed 2018, 2019, 2020 and recently completed again completed radiation on November 23, 2021 due to progression in the right posterior temporal lobe and new lesions in the right occipital lobe. For the past 2 days he has been in bed which per family is unusual for him.  Has also been having nausea, vomiting as well as confusion and hallucinations.  CT abdomen pelvis: Stomach is mildly distended with an air-fluid level which may be secondary to recently ingested fluid versus gastroenteritis. No bowel obstruction.  MRI brain:  Decreased size of left frontal and right cerebellar lesions, consistent with treatment response. Unchanged size of clustered right temporal lesions. Punctate enhancement in the right occipital lobe is unchanged. No new lesions. Unchanged vasogenic edema pattern surrounding the lesions.  New events last 24 hours / Subjective: Patient is feeling better this morning.  Abdominal pain has improved, no further vomiting since yesterday morning in the emergency department.  Feels up to eating something today.  States that he has not had a bowel movement in several days  Assessment & Plan:  Principal Problem:   Nausea and vomiting Active Problems:   Seizure ([place])   Malignant neoplasm metastatic to brain ([place])   Tobacco abuse   Lung cancer ([place])   Malignant neoplasm metastatic to bone ([facility])   Vasogenic brain edema ([facility])   Fever   Failure to thrive in adult   Hypomagnesemia   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[facility])   Nausea, vomiting, abdominal pain -CT abdomen pelvis showing mildly distended stomach, no bowel  obstruction -?Gastroenteritis -Supportive care, IVF, antiemetic  -Advance to regular diet today  Fever -Unclear etiology, ?abdominal -Covid, influenza negative -CXR negative  -UA negative  -Blood cultures pending  -Has been afebrile last 24 hours  Stage IV poorly differentiated lung cancer with mets to brain, bone -MRI brain decreased/unchanged size of previously seen lesions  -Followed by Dr. Alen Blew, Dr. Mickeal Skinner.  I sent them a secure chat with patient's admission status to keep them in the loop  -Continue IV keppra, vimpat. Seizure precautions  -IV decadron   Failure to thrive -Very poor PO intake -Palliative care consulted for goals of care - discussed with patient and brother   Hip pain -Xray: Severe osteoarthritis of the bilateral hips suggesting sequela of avascular necrosis -Pain management  Hypertension -Norvasc  Hypokalemia -Replace  DVT prophylaxis:  SCDs Start: 02/07/22 2223  Code Status: Full Family Communication: Sister at bedside  Disposition Plan:  Status is: Inpatient Remains inpatient appropriate because: Continue IV fluid, palliative care medicine consult pending, await improvement in oral intake   Antimicrobials:  Anti-infectives (From admission, onward)    None        Objective: Vitals:   02/08/22 2121 02/09/22 0104 02/09/22 0616 02/09/22 1006  BP: (!) 166/100 (!) 151/86 (!) 162/88 (!) 174/96  Pulse:  84 83   Resp:  14 14   Temp:  99.7 F (37.6 C) 99.3 F (37.4 C)   TempSrc:  Oral Oral   SpO2:  100% 100%   Weight:      Height:        Intake/Output Summary (Last 24 hours) at 02/09/2022 1158 Last  data filed at 02/09/2022 0348 Gross per 24 hour  Intake 1005 ml  Output 625 ml  Net 380 ml    Filed Weights   02/07/22 1155  Weight: 74 kg    Examination:  General exam: Appears comfortable, calm   Respiratory system: Clear to auscultation. Respiratory effort normal. No respiratory distress. No conversational dyspnea.   Cardiovascular system: S1 & S2 heard, RRR Gastrointestinal system: Abdomen is nondistended, soft, not tender to palpation epigastrium Central nervous system: Alert and oriented. No focal neurological deficits. Speech clear.  Extremities: Symmetric in appearance, tender to palpation left inner thigh, groin Skin: No rashes, lesions or ulcers on exposed skin  Psychiatry: Judgement and insight appear normal. Mood & affect appropriate.   Data Reviewed: I have personally reviewed following labs and imaging studies  CBC: Recent Labs  Lab 02/07/22 1259 02/08/22 0525 02/09/22 0603  WBC 10.7* 8.0 10.6*  NEUTROABS 7.8* 5.5  --   HGB 10.5* 12.6* 10.8*  HCT 33.7* 40.0 33.7*  MCV 76.1* 74.5* 74.1*  PLT 428* 494* 426*    Basic Metabolic Panel: Recent Labs  Lab 02/07/22 1259 02/08/22 0525 02/09/22 0603  NA 137 133* 132*  K 3.1* 3.5 3.3*  CL 104 97* 101  CO2 24 23 21*  GLUCOSE 116* 125* 117*  BUN 9 10 16   CREATININE 0.76 0.69 0.55*  CALCIUM 9.0 9.2 8.8*  MG  --  1.5* 1.8    GFR: Estimated Creatinine Clearance: 112.8 mL/min (A) (by C-G formula based on SCr of 0.55 mg/dL (L)). Liver Function Tests: Recent Labs  Lab 02/07/22 1259  AST 14*  ALT 10  ALKPHOS 88  BILITOT 0.4  PROT 8.1  ALBUMIN 3.5    Recent Labs  Lab 02/07/22 1259  LIPASE 32    Recent Labs  Lab 02/08/22 0525  AMMONIA 34    Coagulation Profile: No results for input(s): "INR", "PROTIME" in the last 168 hours. Cardiac Enzymes: No results for input(s): "CKTOTAL", "CKMB", "CKMBINDEX", "TROPONINI" in the last 168 hours. BNP (last 3 results) No results for input(s): "PROBNP" in the last 8760 hours. HbA1C: No results for input(s): "HGBA1C" in the last 72 hours. CBG: Recent Labs  Lab 02/07/22 1159 02/07/22 1652  GLUCAP 98 87    Lipid Profile: No results for input(s): "CHOL", "HDL", "LDLCALC", "TRIG", "CHOLHDL", "LDLDIRECT" in the last 72 hours. Thyroid Function Tests: No results for input(s):  "TSH", "T4TOTAL", "FREET4", "T3FREE", "THYROIDAB" in the last 72 hours. Anemia Panel: Recent Labs    02/08/22 0525  [id number]  FOLATE 19.5    Sepsis Labs: No results for input(s): "PROCALCITON", "LATICACIDVEN" in the last 168 hours.  Recent Results (from the past 240 hour(s))  Resp Panel by RT-PCR (Flu A&B, Covid) Anterior Nasal Swab     Status: None   Collection Time: 02/07/22  3:19 PM   Specimen: Anterior Nasal Swab  Result Value Ref Range Status   SARS Coronavirus 2 by RT PCR NEGATIVE NEGATIVE Final    Comment: (NOTE) SARS-CoV-2 target nucleic acids are NOT DETECTED.  The SARS-CoV-2 RNA is generally detectable in upper respiratory specimens during the acute phase of infection. The lowest concentration of SARS-CoV-2 viral copies this assay can detect is 138 copies/mL. A negative result does not preclude SARS-Cov-2 infection and should not be used as the sole basis for treatment or other patient management decisions. A negative result may occur with  improper specimen collection/handling, submission of specimen other than nasopharyngeal swab, presence of viral mutation(s) within the areas  targeted by this assay, and inadequate number of viral copies(<138 copies/mL). A negative result must be combined with clinical observations, patient history, and epidemiological information. The expected result is Negative.  Fact Sheet for Patients:  EntrepreneurPulse.com.au  Fact Sheet for Healthcare Providers:  IncredibleEmployment.be  This test is no t yet approved or cleared by the [place] FDA and  has been authorized for detection and/or diagnosis of SARS-CoV-2 by FDA under an Emergency Use Authorization (EUA). This EUA will remain  in effect (meaning this test can be used) for the duration of the COVID-19 declaration under Section 564(b)(1) of the Act, 21 U.S.C.section 360bbb-3(b)(1), unless the authorization is terminated  or  revoked sooner.       Influenza A by PCR NEGATIVE NEGATIVE Final   Influenza B by PCR NEGATIVE NEGATIVE Final    Comment: (NOTE) The Xpert Xpress SARS-CoV-2/FLU/RSV plus assay is intended as an aid in the diagnosis of influenza from Nasopharyngeal swab specimens and should not be used as a sole basis for treatment. Nasal washings and aspirates are unacceptable for Xpert Xpress SARS-CoV-2/FLU/RSV testing.  Fact Sheet for Patients: EntrepreneurPulse.com.au  Fact Sheet for Healthcare Providers: IncredibleEmployment.be  This test is not yet approved or cleared by the [place] FDA and has been authorized for detection and/or diagnosis of SARS-CoV-2 by FDA under an Emergency Use Authorization (EUA). This EUA will remain in effect (meaning this test can be used) for the duration of the COVID-19 declaration under Section 564(b)(1) of the Act, 21 U.S.C. section 360bbb-3(b)(1), unless the authorization is terminated or revoked.  Performed at [facility], [place] [address] [place], [place] [address]   Culture, blood (Routine X 2) w Reflex to ID Panel     Status: None (Preliminary result)   Collection Time: 02/08/22  5:25 AM   Specimen: BLOOD  Result Value Ref Range Status   Specimen Description   Final    BLOOD LEFT WRIST Performed at [facility] [address], [place], [place] [address]    Special Requests   Final    BOTTLES DRAWN AEROBIC AND ANAEROBIC Blood Culture adequate volume Performed at [facility] [address], [place], [place] [address]    Culture   Final    NO GROWTH < 24 HOURS Performed at [facility] [address], [place], [place] [address]    Report Status PENDING  Incomplete  Culture, blood (Routine X 2) w Reflex to ID Panel     Status: None (Preliminary result)   Collection Time: 02/08/22  5:37 AM   Specimen: BLOOD RIGHT HAND  Result Value Ref Range  Status   Specimen Description   Final    BLOOD RIGHT HAND Performed at [facility] [address], [place], [place] [address]    Special Requests   Final    BOTTLES DRAWN AEROBIC ONLY Blood Culture results may not be optimal due to an inadequate volume of blood received in culture bottles Performed at [facility] [address] [place], [place] [address]    Culture   Final    NO GROWTH < 24 HOURS Performed at [facility] [address], [place], [place] [address]    Report Status PENDING  Incomplete      Radiology Studies: DG HIP UNILAT WITH PELVIS 2-3 VIEWS LEFT  Result Date: 02/08/2022 CLINICAL DATA:  Left hip pain EXAM: DG HIP (WITH OR WITHOUT PELVIS) 3V LEFT COMPARISON:  None  Available. FINDINGS: No evidence of hip fracture or dislocation. Bilateral joint space loss with subchondral patchy sclerosis and lucencies. Flattening of the bilateral femoral heads. Soft tissues are unremarkable. 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 Electronically Signed   By: Yetta Glassman M.D.   On: 02/08/2022 09:07   DG CHEST PORT 1 VIEW  Result Date: 02/08/2022 CLINICAL DATA:  51 year old male with history of nausea and vomiting. EXAM: PORTABLE CHEST 1 VIEW COMPARISON:  Chest x-ray 07/14/2019. FINDINGS: Right internal jugular single-lumen Port-A-Cath with tip terminating at the superior cavoatrial junction. Lung volumes are normal. Diffuse interstitial prominence and mild diffuse peribronchial cuffing, similar to the prior examination, suggesting a background of chronic bronchitis. No confluent consolidative airspace disease. No pleural effusions. No pneumothorax. No evidence of pulmonary edema. Heart size is normal. The patient is rotated to the right on today's exam, resulting in distortion of the mediastinal contours and reduced diagnostic sensitivity and specificity for mediastinal pathology.  IMPRESSION: 1. No definite radiographic evidence of acute cardiopulmonary disease. 2. The appearance of the lungs suggests chronic bronchitis, as above. Electronically Signed   By: Vinnie Langton M.D.   On: 02/08/2022 05:08   MR BRAIN W WO CONTRAST  Result Date: 02/07/2022 CLINICAL DATA:  Intracranial metastatic disease. EXAM: MRI HEAD WITHOUT AND WITH CONTRAST TECHNIQUE: Multiplanar, multiecho pulse sequences of the brain and surrounding structures were obtained without and with intravenous contrast. CONTRAST:  7.31mL GADAVIST GADOBUTROL 1 MMOL/ML IV SOLN COMPARISON:  11/03/2021 FINDINGS: Brain: No acute infarct or acute hemorrhage. There are multiple areas of vasogenic edema again seen within the left frontal operculum, right temporal lobe and right cerebellum. These are unchanged. Comparison of the contrast-enhancing lesions is as follows: 1. Right cerebellum 8 mm, previously 10 mm image 48 2. Posterior right temporal cluster, 10 mm, unchanged image 78 3. Punctate right occipital enhancement, unchanged image 65 4. Left frontal lobe, 12 mm, previously 25 mm, image 90 There are no new lesions.  No midline shift or hydrocephalus. Vascular: Normal flow voids. Skull and upper cervical spine: Normal marrow signal. Sinuses/Orbits: Negative. Other: None. IMPRESSION: 1. Decreased size of left frontal and right cerebellar lesions, consistent with treatment response. 2. Unchanged size of clustered right temporal lesions. Punctate enhancement in the right occipital lobe is unchanged. 3. No new lesions. 4. Unchanged vasogenic edema pattern surrounding the lesions. Electronically Signed   By: Ulyses Jarred M.D.   On: 02/07/2022 21:46   CT Head Wo Contrast  Result Date: 02/07/2022 CLINICAL DATA:  Mental status change. History of lung cancer with metastatic disease to brain. EXAM: CT HEAD WITHOUT CONTRAST TECHNIQUE: Contiguous axial images were obtained from the base of the skull through the vertex without intravenous  contrast. RADIATION DOSE REDUCTION: This exam was performed according to the departmental dose-optimization program which includes automated exposure control, adjustment of the mA and/or kV according to patient size and/or use of iterative reconstruction technique. COMPARISON:  CT head 08/17/2019.  MRI head with contrast 11/03/2021 FINDINGS: Brain: 3 x 5 mm calcification in the left anterior insula was present previously. Additional small calcifications in the vicinity have improved. There is thickening of the adjacent left frontal gyrus and edema in the adjacent white matter. Edema has progressed since 2021 but is similar to the MRI of 11/03/2021. Hypodensity right cerebellum corresponding to a known enhancing lesion on MRI. Thickening of the right parietal cortex with vasogenic edema in the white matter. Edema appears progressive since the prior MRI. No  acute hemorrhage or hydrocephalus.  No midline shift. Vascular: Negative for hyperdense vessel Skull: Right parietal craniotomy.  No acute skeletal abnormality. Sinuses/Orbits: Paranasal sinuses clear.  Negative orbit Other: None IMPRESSION: 1. There is thickening of the left frontal gyrus and edema in the adjacent white matter. Edema has progressed since 2021 but is similar to the MRI of 11/03/2021. 2. Thickening of the right parietal cortex with vasogenic edema in the white matter. Edema has progressed since the recent MRI possibly due to tumor growth. 3. Hypodensity right cerebellum corresponding to a known enhancing lesion on MRI. 4. No acute hemorrhage or hydrocephalus. 5. MRI brain without with contrast recommended Electronically Signed   By: Franchot Gallo M.D.   On: 02/07/2022 16:54   CT Abdomen Pelvis W Contrast  Result Date: 02/07/2022 CLINICAL DATA:  Abdominal pain.  Nausea and vomiting. EXAM: CT ABDOMEN AND PELVIS WITH CONTRAST TECHNIQUE: Multidetector CT imaging of the abdomen and pelvis was performed using the standard protocol following bolus  administration of intravenous contrast. RADIATION DOSE REDUCTION: This exam was performed according to the departmental dose-optimization program which includes automated exposure control, adjustment of the mA and/or kV according to patient size and/or use of iterative reconstruction technique. CONTRAST:  184mL OMNIPAQUE IOHEXOL 300 MG/ML  SOLN COMPARISON:  01/14/2022 FINDINGS: Lower chest: No acute abnormality. Centrilobular and paraseptal emphysema. Hepatobiliary: No focal liver abnormality is seen. No gallstones, gallbladder wall thickening, or biliary dilatation. Pancreas: Unremarkable. No pancreatic ductal dilatation or surrounding inflammatory changes. Spleen: Normal in size without focal abnormality. Adrenals/Urinary Tract: Adrenal glands are unremarkable. Kidneys are normal, without renal calculi, focal lesion, or hydronephrosis. Bladder is unremarkable. Stomach/Bowel: Stomach is mildly distended with an air-fluid level which may be secondary to recently ingested fluid versus gastroenteritis. No evidence of bowel wall thickening, distention, or inflammatory changes. Appendix is normal. Vascular/Lymphatic: Normal caliber abdominal aorta with mild atherosclerosis. No lymphadenopathy. Reproductive: Prostate is unremarkable. Other: No abdominal wall hernia or abnormality. No abdominopelvic ascites. Musculoskeletal: No acute osseous abnormality. No aggressive osseous lesion. 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 IMPRESSION: 1. Stomach is mildly distended with an air-fluid level which may be secondary to recently ingested fluid versus gastroenteritis. No bowel obstruction. 2. 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 3.  Emphysema (ICD10-J43.9). 4.  Aortic Atherosclerosis (ICD10-I70.0). Electronically Signed   By: Kathreen Devoid M.D.   On: 02/07/2022 15:04       Scheduled Meds:  amLODipine  5 mg Oral Daily   Chlorhexidine Gluconate Cloth  6 each Topical Daily   dexamethasone (DECADRON) injection  3 mg Intravenous Q12H   feeding supplement  237 mL Oral BID BM   multivitamin with minerals  1 tablet Oral Daily   nicotine  7 mg Transdermal Daily   Continuous Infusions:  sodium chloride 125 mL/hr at 02/09/22 0348   lacosamide (VIMPAT) IV Stopped (02/08/22 2239)   levETIRAcetam 2,000 mg (02/09/22 1013)     LOS: 2 days     Dessa Phi, DO [facility] 02/09/2022, 11:58 AM   Available via Epic secure chat 7am-7pm After these hours, please refer to coverage provider listed on [email or web address]
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
## 2022-02-10 LAB — BASIC METABOLIC PANEL
Anion gap: 10 (ref 5–15)
BUN: 14 mg/dL (ref 6–20)
CO2: 19 mmol/L — ABNORMAL LOW (ref 22–32)
Calcium: 9.2 mg/dL (ref 8.9–10.3)
Chloride: 101 mmol/L (ref 98–111)
Creatinine, Ser: 0.48 mg/dL — ABNORMAL LOW (ref 0.61–1.24)
GFR, Estimated: >60 mL/min (ref >60)
Glucose, Bld: 110 mg/dL — ABNORMAL HIGH (ref 70–99)
Potassium: 3.5 mmol/L (ref 3.5–5.1)
Sodium: 130 mmol/L — ABNORMAL LOW (ref 135–145)

## 2022-02-10 LAB — CBC
HCT: 34.6 % — ABNORMAL LOW (ref 39.0–52.0)
Hemoglobin: 11.2 g/dL — ABNORMAL LOW (ref 13.0–17.0)
MCH: 23.8 pg — ABNORMAL LOW (ref 26.0–34.0)
MCHC: 32.4 g/dL (ref 30.0–36.0)
MCV: 73.5 fL — ABNORMAL LOW (ref 80.0–100.0)
Platelets: 424 10*3/uL — ABNORMAL HIGH (ref 150–400)
RBC: 4.71 MIL/uL (ref 4.22–5.81)
RDW: 15.7 % — ABNORMAL HIGH (ref 11.5–15.5)
WBC: 9.6 10*3/uL (ref 4.0–10.5)
nRBC: 0 % (ref 0.0–0.2)

## 2022-02-10 LAB — MAGNESIUM: Magnesium: 2 mg/dL (ref 1.7–2.4)

## 2022-02-10 MED ORDER — LACOSAMIDE 100 MG PO TABS: 100.0000 mg | ORAL_TABLET | Freq: Two times a day (BID) | ORAL | 1 refills | Status: DC

## 2022-02-10 MED ORDER — AMLODIPINE BESYLATE 5 MG PO TABS: 5.0000 mg | ORAL_TABLET | Freq: Every day | ORAL | 1 refills | Status: DC

## 2022-02-10 MED ORDER — SODIUM CHLORIDE 0.9 % IV SOLN: 12.5000 mg | Freq: Four times a day (QID) | INTRAVENOUS | Status: DC | PRN

## 2022-02-10 NOTE — Progress Notes (Signed)
Patient discharged to home with family, discharge instructions reviewed with patient and sister. Who verbalized understanding.

## 2022-02-10 NOTE — TOC Initial Note (Signed)
Transition of Care [facility]) - Initial/Assessment Note    Patient Details  Name: Eric Mason MRN: [id number] Date of Birth: Nov 25, 1970  Transition of Care [facility]) CM/SW Contact:    Eric Dine, RN Phone Number: 02/10/2022, 1:57 PM  Clinical Narrative:                 [facility] consult for OP hospice; met w/ pt and sister Eric Mason in room; the pt says he lives at home with his brother, and plans to return at d/c; the pt says he does not have dentures, hearing aids, or glasses; he has transportation home; provided pt and sister resource for home hospice providers; they will contact their agency of choice; no TOC needs.        Patient Goals and CMS Choice        Expected Discharge Plan and Services           Expected Discharge Date: 02/10/22                                    Prior Living Arrangements/Services                       Activities of Daily Living Home Assistive Devices/Equipment: None ADL Screening (condition at time of admission) Patient's cognitive ability adequate to safely complete daily activities?: Yes Is the patient deaf or have difficulty hearing?: No Does the patient have difficulty seeing, even when wearing glasses/contacts?: No Does the patient have difficulty concentrating, remembering, or making decisions?: No Patient able to express need for assistance with ADLs?: Yes Does the patient have difficulty dressing or bathing?: No Independently performs ADLs?: Yes (appropriate for developmental age) Does the patient have difficulty walking or climbing stairs?: No Weakness of Legs: None Weakness of Arms/Hands: None  Permission Sought/Granted                  Emotional Assessment              Admission diagnosis:  Generalized abdominal pain [R10.84] Vasogenic brain edema (HCC) [G93.6] Encephalopathy [G93.40] Nausea and vomiting, unspecified vomiting type [R11.2] Patient Active Problem List   Diagnosis Date  Noted   Nausea and vomiting 02/08/2022   Fever 02/08/2022   Failure to thrive in adult 02/08/2022   Hypomagnesemia 02/08/2022   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[place]) 02/08/2022   Vasogenic brain edema ([place]) 02/07/2022   Tobacco dependence 11/13/2019   Polysubstance abuse ([place]) 11/13/2019   AKI (acute kidney injury) ([place]) 09/09/2019   Mediastinal lymphadenopathy 09/01/2019   Goals of care, counseling/discussion    Palliative care by specialist    ARF (acute renal failure) ([place]) 08/17/2019   Pulmonary emphysema ([place]) 04/28/2019   Anxiety 04/28/2019   Malignant neoplasm metastatic to bone ([place]) 10/24/2018   Intractable nausea and vomiting 09/24/2018   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   Lumbar spine tumor 07/25/2018   Encounter for antineoplastic chemotherapy 07/30/2017   Encounter for antineoplastic immunotherapy 07/30/2017   Port-A-Cath in place 06/18/2017   Lung cancer ([place]) 06/03/2017   Genetic testing 05/27/2017   Family history of colon cancer 05/08/2017   Family history of cholangiocarcinoma 05/08/2017   Family history of breast cancer 05/08/2017   Family history of renal cancer 05/08/2017   Family history of thyroid cancer 05/08/2017   Abnormal stress test 11/08/2016   Bilateral leg pain 11/08/2016  Gastroesophageal reflux disease 10/10/2016   Atypical chest pain 10/10/2016   Tobacco abuse 07/04/2016   Malignant neoplasm metastatic to brain [facility]) 07/02/2016   Pulmonary nodule 06/19/2016   Seizure ([place]) 06/17/2016   PCP:  Eric Pier, MD Pharmacy:   [facility], [place] [address] [place] [address] Phone: [phone] Fax: [phone]     Social Determinants of Health (SDOH) Interventions    Readmission Risk Interventions     No data to display

## 2022-02-10 NOTE — Discharge Summary (Signed)
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  He had stereotactic radiation completed 2018, 2019, 2020 and recently completed again completed radiation on November 23, 2021 due to progression in the right posterior temporal lobe and new lesions in the right occipital lobe. For the past 2 days he has been in bed which per family is unusual for him.  Has also been having nausea, vomiting as well as confusion and hallucinations.   CT abdomen pelvis: Stomach is mildly distended with an air-fluid level which may be secondary to recently ingested fluid versus gastroenteritis. No bowel obstruction.   MRI brain:  Decreased size of left frontal and right cerebellar lesions, consistent with treatment response. Unchanged size of clustered right temporal lesions. Punctate enhancement in the right occipital lobe is unchanged. No new lesions. Unchanged vasogenic edema pattern surrounding the lesions.   Patient was treated with conservative measures including IV fluid, antiemetics.  He remained afebrile.  He was tolerating diet, with resolution of abdominal pain and nausea, no further vomiting since being in the emergency department.  He met with palliative care.  Recommend continued outpatient palliative care support and continued goals of care conversation with patient and family.   Discharge Diagnoses:   Principal Problem:   Nausea and  vomiting Active Problems:   Seizure ([place])   Malignant neoplasm metastatic to brain (HCC)   Tobacco abuse   Lung cancer ([place])   Malignant neoplasm metastatic to bone (HCC)   Vasogenic brain edema (HCC)   Fever   Failure to thrive in adult   Hypomagnesemia   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HCC)   Nausea, vomiting, abdominal pain -CT abdomen pelvis showing mildly distended stomach, no bowel obstruction -?Gastroenteritis -Improved with supportive management   Fever -Unclear etiology, ?abdominal -Covid, influenza negative -CXR negative  -UA negative  -Blood cultures negative to date -Has been afebrile last 48 hours   Stage IV poorly differentiated lung cancer with mets to brain, bone -MRI brain decreased/unchanged size of previously seen lesions  -Followed by Dr. Alen Blew, Dr. Mickeal Skinner -Continue keppra, vimpat. 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 -Pain management   Hypertension -Norvasc      Discharge Instructions  Discharge Instructions     Call MD for:  difficulty breathing, headache or visual disturbances   Complete by: As directed    Call MD for:  extreme fatigue   Complete by: As directed    Call MD for:  persistant dizziness or light-headedness   Complete by: As directed    Call MD for:  persistant nausea and vomiting   Complete by: As directed    Call MD for:  severe uncontrolled pain   Complete by: As directed    Call MD for:  temperature >100.4   Complete by: As directed  Diet general   Complete by: As directed    Discharge instructions   Complete by: As directed    You were cared for by a hospitalist during your hospital stay. If you have any questions about your discharge medications or the care you received while  you were in the hospital after you are discharged, you can call the unit and ask to speak with the hospitalist on call if the hospitalist that took care of you is not available. Once you are discharged, your primary care physician will handle any further medical issues. Please note that NO REFILLS for any discharge medications will be authorized once you are discharged, as it is imperative that you return to your primary care physician (or establish a relationship with a primary care physician if you do not have one) for your aftercare needs so that they can reassess your need for medications and monitor your lab values.   Increase activity slowly   Complete by: As directed       Allergies as of 02/10/2022       Reactions   Latex Itching, Rash        Medication List     STOP taking these medications    dexamethasone 1 MG tablet Commonly known as: DECADRON       TAKE these medications    albuterol 108 (90 Base) MCG/ACT inhaler Commonly known as: ProAir HFA Inhale 2 puffs into the lungs every 6 (six) hours as needed for wheezing or shortness of breath.(200/8=25)   amitriptyline 75 MG tablet Commonly known as: ELAVIL Take 1 tablet (75 mg total) by mouth at bedtime.   amLODipine 5 MG tablet Commonly known as: NORVASC Take 1 tablet (5 mg total) by mouth daily. Start taking on: February 11, 2022   B-complex with vitamin C tablet Take 1 tablet by mouth daily.   baclofen 10 MG tablet Commonly known as: LIORESAL Take 1 tablet (10 mg total) by mouth 3 (three) times daily.   bisacodyl 5 MG EC tablet Commonly known as: DULCOLAX Take 2 tablets (10 mg total) by mouth daily as needed for moderate constipation.   diclofenac Sodium 1 % Gel Commonly known as: Voltaren Apply 2 g topically 4 (four) times daily. What changed:  when to take this reasons to take this   DULoxetine 30 MG capsule Commonly known as: CYMBALTA Take 1 capsule (30 mg total) by mouth daily.   folic acid 1  MG tablet Commonly known as: FOLVITE Take 1 tablet (1 mg total) by mouth daily.   gabapentin 800 MG tablet Commonly known as: NEURONTIN TAKE 1 TABLET BY MOUTH 3 TIMES DAILY   Lacosamide 100 MG Tabs Take 1 tablet (100 mg total) by mouth in the morning and at bedtime.   levETIRAcetam 1000 MG tablet Commonly known as: KEPPRA Take 2 tablets (2,000 mg total) by mouth 2 (two) times daily.   lidocaine 5 % Commonly known as: LIDODERM Place 1 patch onto the skin daily. Remove & Discard patch within 12 hours or as directed by MD   meloxicam 7.5 MG tablet Commonly known as: MOBIC Take 7.5 mg by mouth 2 (two) times daily.   multivitamin tablet Take 1 tablet by mouth daily.   Narcan 4 MG/0.1ML Liqd nasal spray kit Generic drug: naloxone See admin instructions.   oxyCODONE 15 MG immediate release tablet Commonly known as: ROXICODONE Take Every 4 hours as needed What changed:  how much to take how to take this when to take this  reasons to take this additional instructions   pantoprazole 40 MG tablet Commonly known as: PROTONIX Take 1 tablet(s) by mouth TWO TIMES DAILY What changed:  how much to take how to take this when to take this additional instructions   polyethylene glycol 17 g packet Commonly known as: MIRALAX / GLYCOLAX Take 17 g by mouth daily as needed for moderate constipation.   Vitamin D 50 MCG (2000 UT) Caps Take 2,000 Units by mouth daily.        Allergies  Allergen Reactions   Latex Itching and Rash      Procedures/Studies: DG HIP UNILAT WITH PELVIS 2-3 VIEWS LEFT  Result Date: 02/08/2022 CLINICAL DATA:  Left hip pain EXAM: DG HIP (WITH OR WITHOUT PELVIS) 3V LEFT COMPARISON:  None Available. FINDINGS: No evidence of hip fracture or dislocation. Bilateral joint space loss with subchondral patchy sclerosis and lucencies. Flattening of the bilateral femoral heads. Soft tissues are unremarkable. IMPRESSION: Severe osteoarthritis of the bilateral  hips suggesting sequela of avascular necrosis. Electronically Signed   By: Yetta Glassman M.D.   On: 02/08/2022 09:07   DG CHEST PORT 1 VIEW  Result Date: 02/08/2022 CLINICAL DATA:  51 year old male with history of nausea and vomiting. EXAM: PORTABLE CHEST 1 VIEW COMPARISON:  Chest x-ray 07/14/2019. FINDINGS: Right internal jugular single-lumen Port-A-Cath with tip terminating at the superior cavoatrial junction. Lung volumes are normal. Diffuse interstitial prominence and mild diffuse peribronchial cuffing, similar to the prior examination, suggesting a background of chronic bronchitis. No confluent consolidative airspace disease. No pleural effusions. No pneumothorax. No evidence of pulmonary edema. Heart size is normal. The patient is rotated to the right on today's exam, resulting in distortion of the mediastinal contours and reduced diagnostic sensitivity and specificity for mediastinal pathology. IMPRESSION: 1. No definite radiographic evidence of acute cardiopulmonary disease. 2. The appearance of the lungs suggests chronic bronchitis, as above. Electronically Signed   By: Vinnie Langton M.D.   On: 02/08/2022 05:08   MR BRAIN W WO CONTRAST  Result Date: 02/07/2022 CLINICAL DATA:  Intracranial metastatic disease. EXAM: MRI HEAD WITHOUT AND WITH CONTRAST TECHNIQUE: Multiplanar, multiecho pulse sequences of the brain and surrounding structures were obtained without and with intravenous contrast. CONTRAST:  7.39m GADAVIST GADOBUTROL 1 MMOL/ML IV SOLN COMPARISON:  11/03/2021 FINDINGS: Brain: No acute infarct or acute hemorrhage. There are multiple areas of vasogenic edema again seen within the left frontal operculum, right temporal lobe and right cerebellum. These are unchanged. Comparison of the contrast-enhancing lesions is as follows: 1. Right cerebellum 8 mm, previously 10 mm image 48 2. Posterior right temporal cluster, 10 mm, unchanged image 78 3. Punctate right occipital enhancement, unchanged  image 65 4. Left frontal lobe, 12 mm, previously 25 mm, image 90 There are no new lesions.  No midline shift or hydrocephalus. Vascular: Normal flow voids. Skull and upper cervical spine: Normal marrow signal. Sinuses/Orbits: Negative. Other: None. IMPRESSION: 1. Decreased size of left frontal and right cerebellar lesions, consistent with treatment response. 2. Unchanged size of clustered right temporal lesions. Punctate enhancement in the right occipital lobe is unchanged. 3. No new lesions. 4. Unchanged vasogenic edema pattern surrounding the lesions. Electronically Signed   By: KUlyses JarredM.D.   On: 02/07/2022 21:46   CT Head Wo Contrast  Result Date: 02/07/2022 CLINICAL DATA:  Mental status change. History of lung cancer with metastatic disease to brain. EXAM: CT HEAD WITHOUT CONTRAST TECHNIQUE: Contiguous axial images were obtained from the base of the skull through the  vertex without intravenous contrast. RADIATION DOSE REDUCTION: This exam was performed according to the departmental dose-optimization program which includes automated exposure control, adjustment of the mA and/or kV according to patient size and/or use of iterative reconstruction technique. COMPARISON:  CT head 08/17/2019.  MRI head with contrast 11/03/2021 FINDINGS: Brain: 3 x 5 mm calcification in the left anterior insula was present previously. Additional small calcifications in the vicinity have improved. There is thickening of the adjacent left frontal gyrus and edema in the adjacent white matter. Edema has progressed since 2021 but is similar to the MRI of 11/03/2021. Hypodensity right cerebellum corresponding to a known enhancing lesion on MRI. Thickening of the right parietal cortex with vasogenic edema in the white matter. Edema appears progressive since the prior MRI. No acute hemorrhage or hydrocephalus.  No midline shift. Vascular: Negative for hyperdense vessel Skull: Right parietal craniotomy.  No acute skeletal  abnormality. Sinuses/Orbits: Paranasal sinuses clear.  Negative orbit Other: None IMPRESSION: 1. There is thickening of the left frontal gyrus and edema in the adjacent white matter. Edema has progressed since 2021 but is similar to the MRI of 11/03/2021. 2. Thickening of the right parietal cortex with vasogenic edema in the white matter. Edema has progressed since the recent MRI possibly due to tumor growth. 3. Hypodensity right cerebellum corresponding to a known enhancing lesion on MRI. 4. No acute hemorrhage or hydrocephalus. 5. MRI brain without with contrast recommended Electronically Signed   By: Franchot Gallo M.D.   On: 02/07/2022 16:54   CT Abdomen Pelvis W Contrast  Result Date: 02/07/2022 CLINICAL DATA:  Abdominal pain.  Nausea and vomiting. EXAM: CT ABDOMEN AND PELVIS WITH CONTRAST TECHNIQUE: Multidetector CT imaging of the abdomen and pelvis was performed using the standard protocol following bolus administration of intravenous contrast. RADIATION DOSE REDUCTION: This exam was performed according to the departmental dose-optimization program which includes automated exposure control, adjustment of the mA and/or kV according to patient size and/or use of iterative reconstruction technique. CONTRAST:  143m OMNIPAQUE IOHEXOL 300 MG/ML  SOLN COMPARISON:  01/14/2022 FINDINGS: Lower chest: No acute abnormality. Centrilobular and paraseptal emphysema. Hepatobiliary: No focal liver abnormality is seen. No gallstones, gallbladder wall thickening, or biliary dilatation. Pancreas: Unremarkable. No pancreatic ductal dilatation or surrounding inflammatory changes. Spleen: Normal in size without focal abnormality. Adrenals/Urinary Tract: Adrenal glands are unremarkable. Kidneys are normal, without renal calculi, focal lesion, or hydronephrosis. Bladder is unremarkable. Stomach/Bowel: Stomach is mildly distended with an air-fluid level which may be secondary to recently ingested fluid versus gastroenteritis. No  evidence of bowel wall thickening, distention, or inflammatory changes. Appendix is normal. Vascular/Lymphatic: Normal caliber abdominal aorta with mild atherosclerosis. No lymphadenopathy. Reproductive: Prostate is unremarkable. Other: No abdominal wall hernia or abnormality. No abdominopelvic ascites. Musculoskeletal: No acute osseous abnormality. No aggressive osseous lesion. 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 IMPRESSION: 1. Stomach is mildly distended with an air-fluid level which may be secondary to recently ingested fluid versus gastroenteritis. No bowel obstruction. 2. 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 3.  Emphysema (ICD10-J43.9). 4.  Aortic Atherosclerosis (ICD10-I70.0). Electronically Signed   By: HKathreen DevoidM.D.   On: 02/07/2022 15:04   CT CHEST ABDOMEN PELVIS W CONTRAST  Result Date: 01/15/2022 CLINICAL DATA:  51year old male with history of non-small cell lung cancer. Follow-up study to evaluate for treatment response. * Tracking Code: BO * EXAM: CT CHEST, ABDOMEN, AND PELVIS  WITH CONTRAST TECHNIQUE: Multidetector CT imaging of the chest, abdomen and pelvis was performed following the standard protocol during bolus administration of intravenous contrast. RADIATION DOSE REDUCTION: This exam was performed according to the departmental dose-optimization program which includes automated exposure control, adjustment of the mA and/or kV according to patient size and/or use of iterative reconstruction technique. CONTRAST:  138m OMNIPAQUE IOHEXOL 300 MG/ML  SOLN COMPARISON:  Multiple priors, most recently CT of the chest, abdomen and pelvis 01/11/2021. FINDINGS: CT CHEST FINDINGS Cardiovascular: Heart size is normal. There is no significant pericardial fluid, thickening or pericardial calcification. There is aortic atherosclerosis, as  well as atherosclerosis of the great vessels of the mediastinum and the coronary arteries, including calcified atherosclerotic plaque in the left anterior descending and left circumflex coronary arteries. Right internal jugular single-lumen Port-A-Cath with tip terminating in the right atrium. Mediastinum/Nodes: No pathologically enlarged mediastinal or hilar lymph nodes. Esophagus is unremarkable in appearance. No axillary lymphadenopathy. Lungs/Pleura: Tiny pulmonary nodule in the posterior aspect of the right upper lobe abutting the superior aspect of the major fissure (axial image 54 of series 4) measuring 6 x 3 mm, stable compared to prior examinations, corresponding to treated pulmonary nodule. 2 mm right upper lobe pulmonary nodule (axial image 53 of series 4) unchanged. No other suspicious appearing pulmonary nodules or masses are noted. No acute consolidative airspace disease. No pleural effusions. Diffuse bronchial wall thickening with mild centrilobular and moderate paraseptal emphysema. Musculoskeletal: There are no aggressive appearing lytic or blastic lesions noted in the visualized portions of the skeleton. CT ABDOMEN PELVIS FINDINGS Hepatobiliary: No suspicious cystic or solid hepatic lesions. No intra or extrahepatic biliary ductal dilatation. Gallbladder is normal in appearance. Pancreas: No pancreatic mass. No pancreatic ductal dilatation. No pancreatic or peripancreatic fluid collections or inflammatory changes. Spleen: Unremarkable. Adrenals/Urinary Tract: Bilateral kidneys and bilateral adrenal glands are normal in appearance. No hydroureteronephrosis. Urinary bladder is unremarkable in appearance. Stomach/Bowel: The appearance of the stomach is normal. There is no pathologic dilatation of small bowel or colon. Normal appendix. Vascular/Lymphatic: Aortic atherosclerosis, without evidence of aneurysm or dissection in the abdominal or pelvic vasculature. No lymphadenopathy noted in the abdomen or  pelvis. Reproductive: Prostate gland and seminal vesicles are unremarkable in appearance. Other: No significant volume of ascites.  No pneumoperitoneum. Musculoskeletal: There are no aggressive appearing lytic or blastic lesions noted in the visualized portions of the skeleton. Extensive areas of sclerosis, lucency and partial collapse are noted in the femoral heads bilaterally, in addition to subchondral sclerosis, subchondral cyst formation and osteophyte formation throughout the hip joints bilaterally, indicative of chronic changes of avascular necrosis in association with chronic degenerative changes of osteoarthritis. IMPRESSION: 1. Stable appearance of treated pulmonary nodule in the posterior aspect of the right upper lobe. No findings to suggest recurrent or metastatic disease in the chest, abdomen or pelvis. 2. Severe avascular necrosis of the femoral heads bilaterally, in addition to advanced chronic degenerative changes of osteoarthritis in the hip joints bilaterally, as above, progressive compared to prior examinations. 3. Aortic atherosclerosis, in addition to 2 vessel coronary artery disease. Please note that although the presence of coronary artery calcium documents the presence of coronary artery disease, the severity of this disease and any potential stenosis cannot be assessed on this non-gated CT examination. Assessment for potential risk factor modification, dietary therapy or pharmacologic therapy may be warranted, if clinically indicated. 4. Diffuse bronchial wall thickening with mild centrilobular and moderate paraseptal emphysema; imaging findings suggestive of underlying COPD. Electronically Signed  By: Vinnie Langton M.D.   On: 01/15/2022 09:25       Discharge Exam: Vitals:   02/09/22 2008 02/10/22 0527  BP: (!) 139/90 (!) 159/96  Pulse: 83 85  Resp: 18 14  Temp: (!) 97.5 F (36.4 C) 99 F (37.2 C)  SpO2: 100% 100%    General: Pt is alert, awake, not in acute  distress Cardiovascular: RRR, S1/S2 +, no edema Respiratory: CTA bilaterally, no wheezing, no rhonchi, no respiratory distress, no conversational dyspnea  Abdominal: Soft, NT, ND, bowel sounds + Extremities: no edema, no cyanosis Psych: Normal mood and affect, stable judgement and insight     The results of significant diagnostics from this hospitalization (including imaging, microbiology, ancillary and laboratory) are listed below for reference.     Microbiology: Recent Results (from the past 240 hour(s))  Resp Panel by RT-PCR (Flu A&B, Covid) Anterior Nasal Swab     Status: None   Collection Time: 02/07/22  3:19 PM   Specimen: Anterior Nasal Swab  Result Value Ref Range Status   SARS Coronavirus 2 by RT PCR NEGATIVE NEGATIVE Final    Comment: (NOTE) SARS-CoV-2 target nucleic acids are NOT DETECTED.  The SARS-CoV-2 RNA is generally detectable in upper respiratory specimens during the acute phase of infection. The lowest concentration of SARS-CoV-2 viral copies this assay can detect is 138 copies/mL. A negative result does not preclude SARS-Cov-2 infection and should not be used as the sole basis for treatment or other patient management decisions. A negative result may occur with  improper specimen collection/handling, submission of specimen other than nasopharyngeal swab, presence of viral mutation(s) within the areas targeted by this assay, and inadequate number of viral copies(<138 copies/mL). A negative result must be combined with clinical observations, patient history, and epidemiological information. The expected result is Negative.  Fact Sheet for Patients:  EntrepreneurPulse.com.au  Fact Sheet for Healthcare Providers:  IncredibleEmployment.be  This test is no t yet approved or cleared by the [place] FDA and  has been authorized for detection and/or diagnosis of SARS-CoV-2 by FDA under an Emergency Use Authorization (EUA).  This EUA will remain  in effect (meaning this test can be used) for the duration of the COVID-19 declaration under Section 564(b)(1) of the Act, 21 U.S.C.section 360bbb-3(b)(1), unless the authorization is terminated  or revoked sooner.       Influenza A by PCR NEGATIVE NEGATIVE Final   Influenza B by PCR NEGATIVE NEGATIVE Final    Comment: (NOTE) The Xpert Xpress SARS-CoV-2/FLU/RSV plus assay is intended as an aid in the diagnosis of influenza from Nasopharyngeal swab specimens and should not be used as a sole basis for treatment. Nasal washings and aspirates are unacceptable for Xpert Xpress SARS-CoV-2/FLU/RSV testing.  Fact Sheet for Patients: EntrepreneurPulse.com.au  Fact Sheet for Healthcare Providers: IncredibleEmployment.be  This test is not yet approved or cleared by the [place] FDA and has been authorized for detection and/or diagnosis of SARS-CoV-2 by FDA under an Emergency Use Authorization (EUA). This EUA will remain in effect (meaning this test can be used) for the duration of the COVID-19 declaration under Section 564(b)(1) of the Act, 21 U.S.C. section 360bbb-3(b)(1), unless the authorization is terminated or revoked.  Performed at [facility], [place] [address], [place], [place] [address]   Culture, blood (Routine X 2) w Reflex to ID Panel     Status: None (Preliminary result)   Collection Time: 02/08/22  5:25 AM   Specimen: BLOOD  Result Value Ref  Range Status   Specimen Description   Final    BLOOD LEFT WRIST Performed at [facility] [address] [place], [place] [address]    Special Requests   Final    BOTTLES DRAWN AEROBIC AND ANAEROBIC Blood Culture adequate volume Performed at [facility] [address], [place], [place] [address]    Culture   Final    NO GROWTH 2 DAYS Performed at [facility] [address], [place], [place] [address]     Report Status PENDING  Incomplete  Culture, blood (Routine X 2) w Reflex to ID Panel     Status: None (Preliminary result)   Collection Time: 02/08/22  5:37 AM   Specimen: BLOOD RIGHT HAND  Result Value Ref Range Status   Specimen Description   Final    BLOOD RIGHT HAND Performed at [facility] [address], [place], [place] [address]    Special Requests   Final    BOTTLES DRAWN AEROBIC ONLY Blood Culture results may not be optimal due to an inadequate volume of blood received in culture bottles Performed at [facility] [address] [place], [place] [address]    Culture   Final    NO GROWTH 2 DAYS Performed at [facility] [address] [place], [place] [address]    Report Status PENDING  Incomplete     Labs: BNP (last 3 results) No results for input(s): "BNP" in the last 8760 hours. Basic Metabolic Panel: Recent Labs  Lab 02/07/22 1259 02/08/22 0525 02/09/22 0603 02/10/22 0505  NA 137 133* 132* 130*  K 3.1* 3.5 3.3* 3.5  CL 104 97* 101 101  CO2 24 23 21* 19*  GLUCOSE 116* 125* 117* 110*  BUN _0 CREATININE 0.76 0.69 0.55* 0.48*  CALCIUM 9.0 9.2 8.8* 9.2  MG  --  1.5* 1.8 2.0   Liver Function Tests: Recent Labs  Lab 02/07/22 1259  AST 14*  ALT 10  ALKPHOS 88  BILITOT 0.4  PROT 8.1  ALBUMIN 3.5   Recent Labs  Lab 02/07/22 1259  LIPASE 32   Recent Labs  Lab 02/08/22 0525  AMMONIA 34   CBC: Recent Labs  Lab 02/07/22 1259 02/08/22 0525 02/09/22 0603 02/10/22 0505  WBC 10.7* 8.0 10.6* 9.6  NEUTROABS 7.8* 5.5  --   --   HGB 10.5* 12.6* 10.8* 11.2*  HCT 33.7* 40.0 33.7* 34.6*  MCV 76.1* 74.5* 74.1* 73.5*  PLT 428* 494* 426* 424*   Cardiac Enzymes: No results for input(s): "CKTOTAL", "CKMB", "CKMBINDEX", "TROPONINI" in the last 168 hours. BNP: Invalid input(s): "POCBNP" CBG: Recent Labs  Lab 02/07/22 1159 02/07/22 1652  GLUCAP 98 87   D-Dimer No results for input(s):  "DDIMER" in the last 72 hours. Hgb A1c No results for input(s): "HGBA1C" in the last 72 hours. Lipid Profile No results for input(s): "CHOL", "HDL", "LDLCALC", "TRIG", "CHOLHDL", "LDLDIRECT" in the last 72 hours. Thyroid function studies No results for input(s): "TSH", "T4TOTAL", "T3FREE", "THYROIDAB" in the last 72 hours.  Invalid input(s): "FREET3" Anemia work up Recent Labs    02/08/22 0525  VITAMINB12 887  FOLATE 19.5   Urinalysis    Component Value Date/Time   COLORURINE YELLOW 02/07/2022 1410   APPEARANCEUR CLOUDY (A) 02/07/2022 1410   LABSPEC 1.016 02/07/2022 1410   PHURINE 8.0 02/07/2022 1410   GLUCOSEU NEGATIVE 02/07/2022 1410   [place] 02/07/2022 1410  BILIRUBINUR NEGATIVE 02/07/2022 1410   KETONESUR 20 (A) 02/07/2022 1410   PROTEINUR NEGATIVE 02/07/2022 1410   NITRITE NEGATIVE 02/07/2022 1410   LEUKOCYTESUR NEGATIVE 02/07/2022 1410   Sepsis Labs Recent Labs  Lab 02/07/22 1259 02/08/22 0525 02/09/22 0603 02/10/22 0505  WBC 10.7* 8.0 10.6* 9.6   Microbiology Recent Results (from the past 240 hour(s))  Resp Panel by RT-PCR (Flu A&B, Covid) Anterior Nasal Swab     Status: None   Collection Time: 02/07/22  3:19 PM   Specimen: Anterior Nasal Swab  Result Value Ref Range Status   SARS Coronavirus 2 by RT PCR NEGATIVE NEGATIVE Final    Comment: (NOTE) SARS-CoV-2 target nucleic acids are NOT DETECTED.  The SARS-CoV-2 RNA is generally detectable in upper respiratory specimens during the acute phase of infection. The lowest concentration of SARS-CoV-2 viral copies this assay can detect is 138 copies/mL. A negative result does not preclude SARS-Cov-2 infection and should not be used as the sole basis for treatment or other patient management decisions. A negative result may occur with  improper specimen collection/handling, submission of specimen other than nasopharyngeal swab, presence of viral mutation(s) within the areas targeted by this assay, and  inadequate number of viral copies(<138 copies/mL). A negative result must be combined with clinical observations, patient history, and epidemiological information. The expected result is Negative.  Fact Sheet for Patients:  EntrepreneurPulse.com.au  Fact Sheet for Healthcare Providers:  IncredibleEmployment.be  This test is no t yet approved or cleared by the [place] FDA and  has been authorized for detection and/or diagnosis of SARS-CoV-2 by FDA under an Emergency Use Authorization (EUA). This EUA will remain  in effect (meaning this test can be used) for the duration of the COVID-19 declaration under Section 564(b)(1) of the Act, 21 U.S.C.section 360bbb-3(b)(1), unless the authorization is terminated  or revoked sooner.       Influenza A by PCR NEGATIVE NEGATIVE Final   Influenza B by PCR NEGATIVE NEGATIVE Final    Comment: (NOTE) The Xpert Xpress SARS-CoV-2/FLU/RSV plus assay is intended as an aid in the diagnosis of influenza from Nasopharyngeal swab specimens and should not be used as a sole basis for treatment. Nasal washings and aspirates are unacceptable for Xpert Xpress SARS-CoV-2/FLU/RSV testing.  Fact Sheet for Patients: EntrepreneurPulse.com.au  Fact Sheet for Healthcare Providers: IncredibleEmployment.be  This test is not yet approved or cleared by the [place] FDA and has been authorized for detection and/or diagnosis of SARS-CoV-2 by FDA under an Emergency Use Authorization (EUA). This EUA will remain in effect (meaning this test can be used) for the duration of the COVID-19 declaration under Section 564(b)(1) of the Act, 21 U.S.C. section 360bbb-3(b)(1), unless the authorization is terminated or revoked.  Performed at [facility], [place] [address], [place], [place] [address]   Culture, blood (Routine X 2) w Reflex to ID Panel     Status: None (Preliminary  result)   Collection Time: 02/08/22  5:25 AM   Specimen: BLOOD  Result Value Ref Range Status   Specimen Description   Final    BLOOD LEFT WRIST Performed at [facility] [address] [place], [place] [address]    Special Requests   Final    BOTTLES DRAWN AEROBIC AND ANAEROBIC Blood Culture adequate volume Performed at [facility] [address] [place], [place] [address]    Culture   Final    NO GROWTH 2 DAYS Performed at [facility],  [address], [place], [place] [address]    Report Status PENDING  Incomplete  Culture, blood (Routine X 2) w Reflex to ID Panel     Status: None (Preliminary result)   Collection Time: 02/08/22  5:37 AM   Specimen: BLOOD RIGHT HAND  Result Value Ref Range Status   Specimen Description   Final    BLOOD RIGHT HAND Performed at [facility] [address], [place], [place] [address]    Special Requests   Final    BOTTLES DRAWN AEROBIC ONLY Blood Culture results may not be optimal due to an inadequate volume of blood received in culture bottles Performed at [facility] [address], [place], [place] [address]    Culture   Final    NO GROWTH 2 DAYS Performed at [facility] [address] [place],  [address]    Report Status PENDING  Incomplete     Patient was seen and examined on the day of discharge and was found to be in stable condition. Time coordinating discharge: 25 minutes including assessment and coordination of care, as well as examination of the patient.   SIGNED:  Dessa Phi, DO [facility] 02/10/2022, 1:03 PM

## 2022-02-12 ENCOUNTER — Telehealth: Payer: Self-pay

## 2022-02-12 NOTE — Telephone Encounter (Signed)
Transition Care Management Unsuccessful Follow-up Telephone Call  Date of discharge and from where:  02/10/2022, [facility]  Attempts:  1st Attempt  Reason for unsuccessful TCM follow-up call:  Left voice message [phone]  call back requested.  This is the phone number for patient's daughter, Margreta Journey. I tried calling the patient [phone] three times and the recording stated that the call cannot be completed as dialed.

## 2022-02-13 ENCOUNTER — Telehealth: Payer: Self-pay

## 2022-02-13 LAB — CULTURE, BLOOD (ROUTINE X 2)
Culture: NO GROWTH
Culture: NO GROWTH
Special Requests: ADEQUATE

## 2022-02-13 NOTE — Telephone Encounter (Signed)
Transition Care Management Unsuccessful Follow-up Telephone Call  Date of discharge and from where:  02/10/2022 [facility]  Attempts:  2nd Attempt  Reason for unsuccessful TCM follow-up call:  Left voice message [phone]  call back requested.  This is the phone number for patient's daughter, Eric Mason. I tried calling the patient [phone] twice and the recording stated that the call cannot be completed as dialed.

## 2022-02-14 ENCOUNTER — Telehealth: Payer: Self-pay

## 2022-02-14 NOTE — Telephone Encounter (Signed)
Transition Care Management Unsuccessful Follow-up Telephone Call  Date of discharge and from where:  02/10/2022, [facility]  Attempts:  3rd Attempt  Reason for unsuccessful TCM follow-up call:  Left voice messages-  on [phone]  and [phone], call back requested.  [phone] is  the phone number for patient's daughter, Margreta Journey.  Letter sent to patient requesting he contact [facility] to schedule a hospital follow up appointment as we have not been able to reach him.

## 2022-02-20 ENCOUNTER — Other Ambulatory Visit: Payer: Self-pay | Admitting: *Deleted

## 2022-02-20 DIAGNOSIS — M79604 Pain in right leg: Secondary | ICD-10-CM

## 2022-02-20 DIAGNOSIS — C3490 Malignant neoplasm of unspecified part of unspecified bronchus or lung: Secondary | ICD-10-CM

## 2022-02-20 DIAGNOSIS — C7931 Secondary malignant neoplasm of brain: Secondary | ICD-10-CM

## 2022-02-20 DIAGNOSIS — C763 Malignant neoplasm of pelvis: Secondary | ICD-10-CM

## 2022-02-20 MED ORDER — OXYCODONE HCL 15 MG PO TABS: ORAL_TABLET | ORAL | 0 refills | Status: DC

## 2022-02-21 ENCOUNTER — Ambulatory Visit
Admission: RE | Admit: 2022-02-21 | Discharge: 2022-02-21 | Disposition: A | Discharge status: Home / Self Care | Payer: Medicaid Other | Source: Ambulatory Visit | Attending: Radiation Oncology | Admitting: Radiation Oncology

## 2022-02-21 DIAGNOSIS — C7931 Secondary malignant neoplasm of brain: Secondary | ICD-10-CM

## 2022-02-21 DIAGNOSIS — C799 Secondary malignant neoplasm of unspecified site: Secondary | ICD-10-CM | POA: Diagnosis not present

## 2022-02-21 MED ORDER — GADOPICLENOL 0.5 MMOL/ML IV SOLN
7.5000 mL | Freq: Once | INTRAVENOUS | Status: AC | PRN
  Administered 2022-02-21: 7.5 mL via INTRAVENOUS

## 2022-02-26 ENCOUNTER — Inpatient Hospital Stay: Payer: Medicaid Other | Attending: Internal Medicine | Admitting: Internal Medicine

## 2022-02-26 ENCOUNTER — Other Ambulatory Visit: Payer: Self-pay

## 2022-02-26 ENCOUNTER — Other Ambulatory Visit: Payer: Self-pay | Admitting: *Deleted

## 2022-02-26 VITALS — BP 117/80 | HR 68 | Temp 98.1°F | Resp 17 | Ht 70.0 in | Wt 144.9 lb

## 2022-02-26 DIAGNOSIS — D492 Neoplasm of unspecified behavior of bone, soft tissue, and skin: Secondary | ICD-10-CM

## 2022-02-26 DIAGNOSIS — Z85118 Personal history of other malignant neoplasm of bronchus and lung: Secondary | ICD-10-CM | POA: Insufficient documentation

## 2022-02-26 DIAGNOSIS — C7931 Secondary malignant neoplasm of brain: Secondary | ICD-10-CM | POA: Insufficient documentation

## 2022-02-26 DIAGNOSIS — F17210 Nicotine dependence, cigarettes, uncomplicated: Secondary | ICD-10-CM | POA: Diagnosis not present

## 2022-02-26 DIAGNOSIS — Z79899 Other long term (current) drug therapy: Secondary | ICD-10-CM | POA: Diagnosis not present

## 2022-02-26 DIAGNOSIS — C7951 Secondary malignant neoplasm of bone: Secondary | ICD-10-CM | POA: Insufficient documentation

## 2022-02-26 DIAGNOSIS — R569 Unspecified convulsions: Secondary | ICD-10-CM

## 2022-02-26 DIAGNOSIS — R29898 Other symptoms and signs involving the musculoskeletal system: Secondary | ICD-10-CM

## 2022-02-26 DIAGNOSIS — G893 Neoplasm related pain (acute) (chronic): Secondary | ICD-10-CM

## 2022-02-26 DIAGNOSIS — C801 Malignant (primary) neoplasm, unspecified: Secondary | ICD-10-CM | POA: Diagnosis present

## 2022-02-26 MED ORDER — DULOXETINE HCL 30 MG PO CPEP: 30.0000 mg | ORAL_CAPSULE | Freq: Every day | ORAL | 5 refills | Status: DC

## 2022-02-26 MED ORDER — DEXAMETHASONE 4 MG PO TABS: 4.0000 mg | ORAL_TABLET | Freq: Every day | ORAL | 1 refills | Status: DC

## 2022-02-26 MED ORDER — LIDOCAINE 5 % EX PTCH: 1.0000 | MEDICATED_PATCH | CUTANEOUS | 1 refills | Status: DC

## 2022-02-26 NOTE — Progress Notes (Signed)
Per Dr Alen Blew ok to refill Cymbalta and Lidocaine patches
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
## 2022-02-27 ENCOUNTER — Other Ambulatory Visit: Payer: Self-pay | Admitting: Internal Medicine

## 2022-02-27 DIAGNOSIS — C7931 Secondary malignant neoplasm of brain: Secondary | ICD-10-CM

## 2022-03-01 ENCOUNTER — Ambulatory Visit: Payer: Medicaid Other | Attending: Internal Medicine | Admitting: Physical Therapy

## 2022-03-01 ENCOUNTER — Ambulatory Visit: Payer: Medicaid Other | Admitting: Physical Therapy

## 2022-03-01 DIAGNOSIS — M79604 Pain in right leg: Secondary | ICD-10-CM | POA: Diagnosis present

## 2022-03-01 DIAGNOSIS — R2689 Other abnormalities of gait and mobility: Secondary | ICD-10-CM | POA: Diagnosis present

## 2022-03-01 DIAGNOSIS — R2681 Unsteadiness on feet: Secondary | ICD-10-CM | POA: Diagnosis present

## 2022-03-01 DIAGNOSIS — M6281 Muscle weakness (generalized): Secondary | ICD-10-CM

## 2022-03-01 DIAGNOSIS — M79605 Pain in left leg: Secondary | ICD-10-CM
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
## 2022-03-02 ENCOUNTER — Telehealth: Payer: Self-pay | Admitting: Physical Therapy

## 2022-03-02 NOTE — Telephone Encounter (Signed)
Dr. Mickeal Skinner, [name] was evaluated by Physical Therapy on 03/01/22.  The patient would benefit from a DME order for a RW due to decreased strength and increased fall risk.   If you agree, please place an order in [facility] workque in [facility] or fax the order to [phone]. Thank you, Excell Seltzer, PT, DPT, [facility] [address] [place], [place]  [address] Phone:  [phone] Fax:  [phone]

## 2022-03-06 ENCOUNTER — Other Ambulatory Visit: Payer: Self-pay | Admitting: *Deleted

## 2022-03-06 ENCOUNTER — Ambulatory Visit (HOSPITAL_COMMUNITY)
Admission: RE | Admit: 2022-03-06 | Discharge: 2022-03-06 | Disposition: A | Discharge status: Home / Self Care | Payer: Medicaid Other | Source: Ambulatory Visit | Attending: Internal Medicine | Admitting: Internal Medicine

## 2022-03-06 DIAGNOSIS — C7931 Secondary malignant neoplasm of brain: Secondary | ICD-10-CM | POA: Insufficient documentation

## 2022-03-06 DIAGNOSIS — R29898 Other symptoms and signs involving the musculoskeletal system: Secondary | ICD-10-CM

## 2022-03-06 DIAGNOSIS — M5126 Other intervertebral disc displacement, lumbar region: Secondary | ICD-10-CM | POA: Diagnosis not present

## 2022-03-06 MED ORDER — GADOBUTROL 1 MMOL/ML IV SOLN
6.5000 mL | Freq: Once | INTRAVENOUS | Status: AC | PRN
  Administered 2022-03-06: 6.5 mL via INTRAVENOUS

## 2022-03-06 NOTE — Progress Notes (Signed)
Order for DME for Rolling walker was added and requested from [place].

## 2022-03-09 ENCOUNTER — Ambulatory Visit: Payer: Medicaid Other | Admitting: Physical Therapy

## 2022-03-09 DIAGNOSIS — M6281 Muscle weakness (generalized): Secondary | ICD-10-CM

## 2022-03-09 DIAGNOSIS — R2689 Other abnormalities of gait and mobility: Secondary | ICD-10-CM

## 2022-03-09 DIAGNOSIS — R2681 Unsteadiness on feet: Secondary | ICD-10-CM
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
## 2022-03-12 ENCOUNTER — Other Ambulatory Visit: Payer: Self-pay | Admitting: Internal Medicine

## 2022-03-13 ENCOUNTER — Ambulatory Visit: Payer: Medicaid Other | Admitting: Physical Therapy

## 2022-03-13 ENCOUNTER — Other Ambulatory Visit: Payer: Self-pay | Admitting: *Deleted

## 2022-03-13 ENCOUNTER — Telehealth: Payer: Self-pay | Admitting: *Deleted

## 2022-03-13 DIAGNOSIS — M79605 Pain in left leg: Secondary | ICD-10-CM

## 2022-03-13 DIAGNOSIS — C3490 Malignant neoplasm of unspecified part of unspecified bronchus or lung: Secondary | ICD-10-CM

## 2022-03-13 DIAGNOSIS — C763 Malignant neoplasm of pelvis: Secondary | ICD-10-CM

## 2022-03-13 DIAGNOSIS — C7931 Secondary malignant neoplasm of brain: Secondary | ICD-10-CM

## 2022-03-13 MED ORDER — OXYCODONE HCL 15 MG PO TABS: ORAL_TABLET | ORAL | 0 refills | Status: DC

## 2022-03-13 NOTE — Telephone Encounter (Signed)
Notified patient that Dr. Alen Blew sent refill for his pain med to [facility] and to contact pharmacy for further info regarding pick up. Patient verbalized understanding.

## 2022-03-13 NOTE — Telephone Encounter (Signed)
Patient called - refill requested for pain medication Request routed to Dr. Alen Blew

## 2022-03-14 ENCOUNTER — Telehealth: Payer: Self-pay | Admitting: *Deleted

## 2022-03-14 DIAGNOSIS — M87051 Idiopathic aseptic necrosis of right femur: Secondary | ICD-10-CM | POA: Diagnosis not present

## 2022-03-14 NOTE — Telephone Encounter (Signed)
A request for rollator  with seat to [place] through [facility]
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
## 2022-03-17 ENCOUNTER — Other Ambulatory Visit: Payer: Self-pay | Admitting: Internal Medicine

## 2022-03-17 DIAGNOSIS — C7931 Secondary malignant neoplasm of brain: Secondary | ICD-10-CM

## 2022-03-19 ENCOUNTER — Inpatient Hospital Stay: Payer: Medicaid Other | Attending: Internal Medicine | Admitting: Internal Medicine

## 2022-03-19 ENCOUNTER — Other Ambulatory Visit: Payer: Self-pay

## 2022-03-19 VITALS — BP 101/68 | HR 85 | Temp 97.5°F | Resp 18 | Wt 150.9 lb

## 2022-03-19 DIAGNOSIS — F17210 Nicotine dependence, cigarettes, uncomplicated: Secondary | ICD-10-CM | POA: Insufficient documentation

## 2022-03-19 DIAGNOSIS — Z79899 Other long term (current) drug therapy: Secondary | ICD-10-CM | POA: Diagnosis not present

## 2022-03-19 DIAGNOSIS — R569 Unspecified convulsions: Secondary | ICD-10-CM | POA: Diagnosis not present

## 2022-03-19 DIAGNOSIS — C801 Malignant (primary) neoplasm, unspecified: Secondary | ICD-10-CM | POA: Diagnosis present

## 2022-03-19 DIAGNOSIS — J449 Chronic obstructive pulmonary disease, unspecified: Secondary | ICD-10-CM | POA: Diagnosis not present

## 2022-03-19 DIAGNOSIS — Z7952 Long term (current) use of systemic steroids: Secondary | ICD-10-CM | POA: Insufficient documentation

## 2022-03-19 DIAGNOSIS — C7931 Secondary malignant neoplasm of brain: Secondary | ICD-10-CM

## 2022-03-19 DIAGNOSIS — C7951 Secondary malignant neoplasm of bone: Secondary | ICD-10-CM | POA: Insufficient documentation

## 2022-03-19 DIAGNOSIS — I10 Essential (primary) hypertension: Secondary | ICD-10-CM | POA: Diagnosis not present
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
## 2022-03-20 ENCOUNTER — Telehealth: Payer: Self-pay | Admitting: Internal Medicine

## 2022-03-20 NOTE — Telephone Encounter (Signed)
Called patient to notify of new appointments. Patient notified.

## 2022-03-21 ENCOUNTER — Ambulatory Visit: Payer: Medicaid Other | Admitting: Physical Therapy
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
## 2022-03-26 ENCOUNTER — Ambulatory Visit: Payer: Medicaid Other | Attending: Internal Medicine

## 2022-03-26 DIAGNOSIS — R2689 Other abnormalities of gait and mobility: Secondary | ICD-10-CM | POA: Diagnosis present

## 2022-03-26 DIAGNOSIS — M79604 Pain in right leg: Secondary | ICD-10-CM | POA: Insufficient documentation

## 2022-03-26 DIAGNOSIS — M79605 Pain in left leg: Secondary | ICD-10-CM | POA: Diagnosis present

## 2022-03-26 DIAGNOSIS — M6281 Muscle weakness (generalized): Secondary | ICD-10-CM

## 2022-03-26 DIAGNOSIS — R2681 Unsteadiness on feet: Secondary | ICD-10-CM | POA: Diagnosis present

## 2022-03-27 ENCOUNTER — Ambulatory Visit: Payer: Medicaid Other | Admitting: Physical Therapy
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
## 2022-03-28 ENCOUNTER — Encounter: Payer: Self-pay | Admitting: Internal Medicine

## 2022-03-28 ENCOUNTER — Other Ambulatory Visit: Payer: Self-pay

## 2022-03-28 ENCOUNTER — Ambulatory Visit: Payer: Medicaid Other

## 2022-03-28 DIAGNOSIS — M79605 Pain in left leg: Secondary | ICD-10-CM

## 2022-03-28 DIAGNOSIS — M6281 Muscle weakness (generalized): Secondary | ICD-10-CM | POA: Diagnosis not present

## 2022-03-28 DIAGNOSIS — R2681 Unsteadiness on feet: Secondary | ICD-10-CM

## 2022-03-28 DIAGNOSIS — R2689 Other abnormalities of gait and mobility: Secondary | ICD-10-CM

## 2022-03-28 DIAGNOSIS — M79604 Pain in right leg: Secondary | ICD-10-CM

## 2022-03-28 MED ORDER — DEXAMETHASONE 4 MG PO TABS: 4.0000 mg | ORAL_TABLET | Freq: Every day | ORAL | 1 refills | Status: DC

## 2022-03-30 ENCOUNTER — Other Ambulatory Visit: Payer: Self-pay | Admitting: Internal Medicine

## 2022-03-30 ENCOUNTER — Telehealth: Payer: Self-pay

## 2022-03-30 MED ORDER — AMITRIPTYLINE HCL 100 MG PO TABS: 100.0000 mg | ORAL_TABLET | Freq: Every day | ORAL | 1 refills | Status: DC

## 2022-03-30 NOTE — Telephone Encounter (Signed)
LM for pt advising of new prescription and increased dose.

## 2022-03-30 NOTE — Telephone Encounter (Signed)
PC from pt stating he has been taking amitriptyline 75 mg at bedtime as directed but it is not working.  It is still taking him 2-3 hours to fall asleep and then he does not stay asleep.  Please advise

## 2022-04-02 ENCOUNTER — Other Ambulatory Visit: Payer: Self-pay | Admitting: Radiation Therapy

## 2022-04-03 ENCOUNTER — Other Ambulatory Visit: Payer: Self-pay | Admitting: *Deleted

## 2022-04-03 DIAGNOSIS — C7931 Secondary malignant neoplasm of brain: Secondary | ICD-10-CM
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
## 2022-04-10 ENCOUNTER — Ambulatory Visit: Payer: Medicaid Other

## 2022-04-10 ENCOUNTER — Other Ambulatory Visit: Payer: Self-pay | Admitting: Oncology

## 2022-04-10 DIAGNOSIS — R112 Nausea with vomiting, unspecified: Secondary | ICD-10-CM

## 2022-04-11 ENCOUNTER — Other Ambulatory Visit: Payer: Self-pay

## 2022-04-12 ENCOUNTER — Encounter: Payer: Self-pay | Admitting: Physical Therapy

## 2022-04-12 ENCOUNTER — Ambulatory Visit: Payer: Medicaid Other | Attending: Internal Medicine | Admitting: Physical Therapy

## 2022-04-12 DIAGNOSIS — M6281 Muscle weakness (generalized): Secondary | ICD-10-CM | POA: Diagnosis present

## 2022-04-12 DIAGNOSIS — M79605 Pain in left leg: Secondary | ICD-10-CM | POA: Insufficient documentation

## 2022-04-12 DIAGNOSIS — R2681 Unsteadiness on feet: Secondary | ICD-10-CM | POA: Diagnosis present

## 2022-04-12 DIAGNOSIS — M79604 Pain in right leg: Secondary | ICD-10-CM | POA: Insufficient documentation
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
## 2022-04-17 ENCOUNTER — Ambulatory Visit: Payer: Medicaid Other | Admitting: Physical Therapy

## 2022-04-17 ENCOUNTER — Encounter: Payer: Self-pay | Admitting: Physical Therapy

## 2022-04-17 DIAGNOSIS — M6281 Muscle weakness (generalized): Secondary | ICD-10-CM

## 2022-04-17 DIAGNOSIS — M79604 Pain in right leg: Secondary | ICD-10-CM

## 2022-04-17 DIAGNOSIS — M79605 Pain in left leg: Secondary | ICD-10-CM

## 2022-04-17 DIAGNOSIS — R2681 Unsteadiness on feet: Secondary | ICD-10-CM
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
## 2022-04-18 ENCOUNTER — Encounter: Payer: Self-pay | Admitting: Medical Oncology

## 2022-04-18 ENCOUNTER — Telehealth: Payer: Self-pay

## 2022-04-18 NOTE — Telephone Encounter (Signed)
T/C from pt requesting a refill for his pain medication. Oxycodone 15 mg.  He said he is currently taking 1.5 tablet PO every 4 hours as needed.  He is also requesting a refill for protonix 40 mg BID  Please send to [facility]

## 2022-04-19 ENCOUNTER — Other Ambulatory Visit: Payer: Self-pay | Admitting: *Deleted

## 2022-04-19 ENCOUNTER — Other Ambulatory Visit: Payer: Self-pay | Admitting: Physician Assistant

## 2022-04-19 ENCOUNTER — Ambulatory Visit: Payer: Medicaid Other | Admitting: Physical Therapy

## 2022-04-19 ENCOUNTER — Encounter: Payer: Self-pay | Admitting: Internal Medicine

## 2022-04-19 DIAGNOSIS — M79605 Pain in left leg: Secondary | ICD-10-CM

## 2022-04-19 DIAGNOSIS — R112 Nausea with vomiting, unspecified: Secondary | ICD-10-CM

## 2022-04-19 DIAGNOSIS — C3490 Malignant neoplasm of unspecified part of unspecified bronchus or lung: Secondary | ICD-10-CM

## 2022-04-19 DIAGNOSIS — C7931 Secondary malignant neoplasm of brain: Secondary | ICD-10-CM

## 2022-04-19 DIAGNOSIS — C763 Malignant neoplasm of pelvis: Secondary | ICD-10-CM

## 2022-04-19 MED ORDER — PANTOPRAZOLE SODIUM 40 MG PO TBEC: DELAYED_RELEASE_TABLET | ORAL | 5 refills | Status: AC

## 2022-04-19 MED ORDER — OXYCODONE HCL 15 MG PO TABS: 15.0000 mg | ORAL_TABLET | ORAL | 0 refills | Status: DC | PRN

## 2022-04-19 NOTE — Telephone Encounter (Signed)
Pt advised of 2/12 appt with PC and a prescription has been sent to his pharmacy for Oxycodone 15 mg #24 for enough pain meds to last until his appt.

## 2022-04-19 NOTE — Telephone Encounter (Signed)
LM for Eric Mason that he has been referred to Palliative Care here at [facility] for medication assistance

## 2022-04-20 ENCOUNTER — Telehealth: Payer: Self-pay | Admitting: *Deleted

## 2022-04-20 NOTE — Telephone Encounter (Signed)
Eric Mason states he is out of his Oxycodone. PA sent in a refill to [facility] on 2/7 for enough tablets until he can establish care with providers assuming his care, but it is too early to fill. RN confirmed with Eric Mason how he is taking the pain pills. Ordered 1 tablet every 4 hours. Eric Mason states that Eric Mason told him he could take him 1 and 1/2 tablets if the pain was severe. This has caused him to run out early. Eric Mason has moved from our practice. Eric Mason will be assuming care and is not scheduled until April after CT scan. ( Eric Mason is seeing for brain mets.) RN explained that MDs here cannot increase his pain medication without having seen him first. Verbalized understanding.  Pt is schedule to see Palliative Care on Monday to evaluate pain.  RN discussed alternatives to use in the meantime. He uses a heating pad, does daily soaks in bath with epsom salt, takes tylenol and ibuprofen. Has used pain patches, discussed OTC alternatives. He was referred to Eric Mason by Eric Mason for additional nerve block injections, but has not heard from them. RN directed him to call. He called and will see Eric Mason next week. He also goes to PT, I encouraged him to continue to see them as scheduled.  Eric Mason states his knees are swollen and he is having to use a walker. "I cannot walk without the walker, I just hurt so bad in my back"   I encouraged him to use the alternatives we discussed until he can get in to [facility] Monday for evaluation.

## 2022-04-23 ENCOUNTER — Ambulatory Visit: Payer: Medicaid Other

## 2022-04-23 ENCOUNTER — Encounter: Payer: Self-pay | Admitting: Nurse Practitioner

## 2022-04-23 ENCOUNTER — Other Ambulatory Visit: Payer: Self-pay | Admitting: Internal Medicine

## 2022-04-23 ENCOUNTER — Other Ambulatory Visit: Payer: Self-pay

## 2022-04-23 ENCOUNTER — Inpatient Hospital Stay: Payer: Medicaid Other | Attending: Internal Medicine | Admitting: Nurse Practitioner

## 2022-04-23 VITALS — BP 122/79 | HR 105 | Temp 97.7°F | Resp 16 | Ht 70.0 in | Wt 155.5 lb

## 2022-04-23 DIAGNOSIS — M79604 Pain in right leg: Secondary | ICD-10-CM | POA: Diagnosis not present

## 2022-04-23 DIAGNOSIS — M79605 Pain in left leg: Secondary | ICD-10-CM

## 2022-04-23 DIAGNOSIS — M792 Neuralgia and neuritis, unspecified: Secondary | ICD-10-CM | POA: Diagnosis not present

## 2022-04-23 DIAGNOSIS — G893 Neoplasm related pain (acute) (chronic): Secondary | ICD-10-CM | POA: Diagnosis not present

## 2022-04-23 DIAGNOSIS — R530 Neoplastic (malignant) related fatigue: Secondary | ICD-10-CM

## 2022-04-23 DIAGNOSIS — C763 Malignant neoplasm of pelvis: Secondary | ICD-10-CM | POA: Diagnosis not present

## 2022-04-23 DIAGNOSIS — C7931 Secondary malignant neoplasm of brain: Secondary | ICD-10-CM | POA: Diagnosis not present

## 2022-04-23 DIAGNOSIS — Z515 Encounter for palliative care: Secondary | ICD-10-CM

## 2022-04-23 DIAGNOSIS — C3490 Malignant neoplasm of unspecified part of unspecified bronchus or lung: Secondary | ICD-10-CM | POA: Diagnosis not present

## 2022-04-23 MED ORDER — XTAMPZA ER 9 MG PO C12A: 9.0000 mg | EXTENDED_RELEASE_CAPSULE | Freq: Two times a day (BID) | ORAL | 0 refills | Status: DC

## 2022-04-23 MED ORDER — OXYCODONE HCL 15 MG PO TABS: 15.0000 mg | ORAL_TABLET | ORAL | 0 refills | Status: DC | PRN

## 2022-04-23 MED ORDER — LIDOCAINE 5 % EX PTCH: 1.0000 | MEDICATED_PATCH | CUTANEOUS | 1 refills | Status: DC
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
## 2022-04-25 ENCOUNTER — Ambulatory Visit: Payer: Medicaid Other | Admitting: Physical Therapy

## 2022-04-25 ENCOUNTER — Other Ambulatory Visit: Payer: Self-pay

## 2022-04-25 DIAGNOSIS — Z515 Encounter for palliative care: Secondary | ICD-10-CM

## 2022-04-25 DIAGNOSIS — C7931 Secondary malignant neoplasm of brain: Secondary | ICD-10-CM

## 2022-04-25 DIAGNOSIS — C763 Malignant neoplasm of pelvis: Secondary | ICD-10-CM

## 2022-04-25 DIAGNOSIS — C3490 Malignant neoplasm of unspecified part of unspecified bronchus or lung: Secondary | ICD-10-CM

## 2022-04-25 DIAGNOSIS — M79604 Pain in right leg: Secondary | ICD-10-CM

## 2022-04-25 MED ORDER — XTAMPZA ER 9 MG PO C12A: 9.0000 mg | EXTENDED_RELEASE_CAPSULE | Freq: Two times a day (BID) | ORAL | 0 refills | Status: DC

## 2022-04-25 MED ORDER — OXYCODONE HCL 15 MG PO TABS: 15.0000 mg | ORAL_TABLET | ORAL | 0 refills | Status: DC | PRN

## 2022-04-25 NOTE — Telephone Encounter (Signed)
Pt called stating he could not get his medication refill, this RN called the pharmacy and confirmed that their system was down and would be for a few days, per Lexine Baton, NP, medication sent to another pharmacy. See new orders

## 2022-04-26 ENCOUNTER — Other Ambulatory Visit: Payer: Self-pay

## 2022-04-26 DIAGNOSIS — C763 Malignant neoplasm of pelvis: Secondary | ICD-10-CM

## 2022-04-26 DIAGNOSIS — Z515 Encounter for palliative care: Secondary | ICD-10-CM

## 2022-04-26 DIAGNOSIS — M79604 Pain in right leg: Secondary | ICD-10-CM

## 2022-04-26 DIAGNOSIS — C7931 Secondary malignant neoplasm of brain: Secondary | ICD-10-CM

## 2022-04-26 DIAGNOSIS — C3490 Malignant neoplasm of unspecified part of unspecified bronchus or lung: Secondary | ICD-10-CM

## 2022-04-26 MED ORDER — XTAMPZA ER 9 MG PO C12A: 9.0000 mg | EXTENDED_RELEASE_CAPSULE | Freq: Two times a day (BID) | ORAL | 0 refills | Status: DC

## 2022-04-26 MED ORDER — OXYCODONE HCL 15 MG PO TABS: 15.0000 mg | ORAL_TABLET | ORAL | 0 refills | Status: DC | PRN

## 2022-04-26 NOTE — Telephone Encounter (Signed)
Pt notified RN that pt pharmacy was unable to fill his medications unless the pt "transferred" is care to that pharmacy per pharmacy policy. Pt made aware of this and requested medications be sent back to original pharmacy where he would wait for them. Lexine Baton, NP made aware.

## 2022-04-26 NOTE — Progress Notes (Unsigned)
Pt unable to pick up medication as current pharmacy will not fill unless pt "transfers" all their other medications to that pharmacy per pharmacy protocol. Pt made aware of this and requested medication be sent back to original pharmacy, Lexine Baton, NP made aware fi this.

## 2022-05-01 ENCOUNTER — Other Ambulatory Visit: Payer: Self-pay

## 2022-05-01 DIAGNOSIS — C763 Malignant neoplasm of pelvis: Secondary | ICD-10-CM
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
## 2022-05-08 ENCOUNTER — Inpatient Hospital Stay: Payer: Medicaid Other | Admitting: Nurse Practitioner

## 2022-05-11 ENCOUNTER — Telehealth: Payer: Self-pay

## 2022-05-11 NOTE — Telephone Encounter (Signed)
Attempted to call pt and to LVM regarding refill. Unable to leave VM, pt is too early for refill of pain medication.

## 2022-05-18 ENCOUNTER — Telehealth: Payer: Self-pay | Admitting: Internal Medicine

## 2022-05-18 NOTE — Telephone Encounter (Signed)
Per 3/8 IB reached out to schedule patient, patient not available will call back later.
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
## 2022-05-29 ENCOUNTER — Inpatient Hospital Stay: Payer: Medicaid Other | Attending: Internal Medicine | Admitting: Nurse Practitioner

## 2022-05-29 ENCOUNTER — Encounter: Payer: Self-pay | Admitting: Nurse Practitioner

## 2022-05-29 ENCOUNTER — Other Ambulatory Visit: Payer: Self-pay

## 2022-05-29 VITALS — BP 109/75 | HR 96 | Temp 98.7°F | Resp 18 | Ht 70.0 in | Wt 157.7 lb

## 2022-05-29 DIAGNOSIS — M79605 Pain in left leg: Secondary | ICD-10-CM | POA: Diagnosis not present

## 2022-05-29 DIAGNOSIS — M792 Neuralgia and neuritis, unspecified: Secondary | ICD-10-CM

## 2022-05-29 DIAGNOSIS — Z515 Encounter for palliative care: Secondary | ICD-10-CM

## 2022-05-29 DIAGNOSIS — C7931 Secondary malignant neoplasm of brain: Secondary | ICD-10-CM | POA: Diagnosis not present

## 2022-05-29 DIAGNOSIS — C763 Malignant neoplasm of pelvis: Secondary | ICD-10-CM | POA: Diagnosis not present

## 2022-05-29 DIAGNOSIS — C3490 Malignant neoplasm of unspecified part of unspecified bronchus or lung: Secondary | ICD-10-CM | POA: Diagnosis not present

## 2022-05-29 DIAGNOSIS — G893 Neoplasm related pain (acute) (chronic): Secondary | ICD-10-CM

## 2022-05-29 DIAGNOSIS — M79604 Pain in right leg: Secondary | ICD-10-CM

## 2022-05-29 MED ORDER — OXYCODONE HCL 15 MG PO TABS: 15.0000 mg | ORAL_TABLET | ORAL | 0 refills | Status: DC | PRN

## 2022-05-29 MED ORDER — XTAMPZA ER 9 MG PO C12A: 9.0000 mg | EXTENDED_RELEASE_CAPSULE | Freq: Two times a day (BID) | ORAL | 0 refills | Status: DC

## 2022-06-02 ENCOUNTER — Other Ambulatory Visit: Payer: Self-pay | Admitting: Internal Medicine

## 2022-06-04 ENCOUNTER — Telehealth: Payer: Self-pay | Admitting: *Deleted

## 2022-06-04 ENCOUNTER — Encounter: Payer: Self-pay | Admitting: Internal Medicine

## 2022-06-04 MED ORDER — LORAZEPAM 1 MG PO TABS: 1.0000 mg | ORAL_TABLET | Freq: Once | ORAL | 0 refills | Status: DC | PRN

## 2022-06-04 NOTE — Telephone Encounter (Signed)
Patient has MRi scheduled for 06/13/2022 and requested Ativan for MRI.    Routing to Dr Mickeal Skinner to please review for ordering.

## 2022-06-04 NOTE — Addendum Note (Signed)
Addended by: Ventura Sellers on: 06/04/2022 10:17 AM   Modules accepted: Orders

## 2022-06-11 ENCOUNTER — Other Ambulatory Visit: Payer: Self-pay | Admitting: Internal Medicine

## 2022-06-13 ENCOUNTER — Inpatient Hospital Stay: Admission: RE | Admit: 2022-06-13 | Payer: Medicaid Other | Source: Ambulatory Visit

## 2022-06-14 ENCOUNTER — Ambulatory Visit: Payer: Medicaid Other | Admitting: Oncology

## 2022-06-14 ENCOUNTER — Other Ambulatory Visit: Payer: Medicaid Other

## 2022-06-14 ENCOUNTER — Inpatient Hospital Stay: Payer: Medicaid Other | Attending: Internal Medicine

## 2022-06-14 ENCOUNTER — Telehealth: Payer: Self-pay | Admitting: Medical Oncology

## 2022-06-14 ENCOUNTER — Inpatient Hospital Stay: Payer: Medicaid Other | Admitting: Internal Medicine

## 2022-06-14 DIAGNOSIS — C7951 Secondary malignant neoplasm of bone: Secondary | ICD-10-CM | POA: Insufficient documentation

## 2022-06-14 DIAGNOSIS — Z923 Personal history of irradiation: Secondary | ICD-10-CM | POA: Insufficient documentation

## 2022-06-14 DIAGNOSIS — Z79899 Other long term (current) drug therapy: Secondary | ICD-10-CM | POA: Insufficient documentation

## 2022-06-14 DIAGNOSIS — C7931 Secondary malignant neoplasm of brain: Secondary | ICD-10-CM | POA: Insufficient documentation

## 2022-06-14 DIAGNOSIS — C801 Malignant (primary) neoplasm, unspecified: Secondary | ICD-10-CM | POA: Insufficient documentation

## 2022-06-14 DIAGNOSIS — Z9221 Personal history of antineoplastic chemotherapy: Secondary | ICD-10-CM | POA: Insufficient documentation

## 2022-06-14 NOTE — Telephone Encounter (Signed)
I lvm to cal about pt appts.
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
## 2022-06-18 ENCOUNTER — Inpatient Hospital Stay: Payer: Medicaid Other | Admitting: Internal Medicine

## 2022-06-18 ENCOUNTER — Inpatient Hospital Stay: Payer: Medicaid Other | Admitting: Nurse Practitioner

## 2022-06-18 ENCOUNTER — Telehealth: Payer: Self-pay | Admitting: *Deleted

## 2022-06-18 ENCOUNTER — Inpatient Hospital Stay: Payer: Medicaid Other

## 2022-06-18 NOTE — Telephone Encounter (Signed)
Patient did not follow through with his MRI.  Attempted to reach the patient to advise that today's visit that was intended for review of the MRI needs to be rescheduled after his gets his MRI completed.    Unable to leave a voice message on number on file.

## 2022-06-19 ENCOUNTER — Telehealth: Payer: Self-pay | Admitting: Internal Medicine

## 2022-06-19 NOTE — Telephone Encounter (Signed)
Patient called to reschedule missed appointments. Patient rescheduled and notified.  

## 2022-06-25 ENCOUNTER — Telehealth: Payer: Self-pay

## 2022-06-25 ENCOUNTER — Encounter: Payer: Self-pay | Admitting: Internal Medicine

## 2022-06-25 ENCOUNTER — Inpatient Hospital Stay: Payer: Medicaid Other

## 2022-06-25 ENCOUNTER — Other Ambulatory Visit: Payer: Self-pay

## 2022-06-25 ENCOUNTER — Inpatient Hospital Stay (HOSPITAL_BASED_OUTPATIENT_CLINIC_OR_DEPARTMENT_OTHER): Payer: Medicaid Other | Admitting: Internal Medicine

## 2022-06-25 ENCOUNTER — Ambulatory Visit: Payer: Medicaid Other | Admitting: Internal Medicine

## 2022-06-25 ENCOUNTER — Telehealth: Payer: Self-pay | Admitting: Nurse Practitioner

## 2022-06-25 ENCOUNTER — Other Ambulatory Visit: Payer: Self-pay | Admitting: Nurse Practitioner

## 2022-06-25 ENCOUNTER — Telehealth: Payer: Self-pay | Admitting: *Deleted

## 2022-06-25 VITALS — BP 130/82 | HR 86 | Temp 98.6°F | Resp 16 | Wt 159.3 lb

## 2022-06-25 DIAGNOSIS — C3490 Malignant neoplasm of unspecified part of unspecified bronchus or lung: Secondary | ICD-10-CM | POA: Diagnosis not present

## 2022-06-25 DIAGNOSIS — C763 Malignant neoplasm of pelvis: Secondary | ICD-10-CM

## 2022-06-25 DIAGNOSIS — C7931 Secondary malignant neoplasm of brain: Secondary | ICD-10-CM | POA: Diagnosis present

## 2022-06-25 DIAGNOSIS — M79604 Pain in right leg: Secondary | ICD-10-CM

## 2022-06-25 DIAGNOSIS — Z9221 Personal history of antineoplastic chemotherapy: Secondary | ICD-10-CM | POA: Diagnosis not present

## 2022-06-25 DIAGNOSIS — Z95828 Presence of other vascular implants and grafts: Secondary | ICD-10-CM

## 2022-06-25 DIAGNOSIS — G893 Neoplasm related pain (acute) (chronic): Secondary | ICD-10-CM

## 2022-06-25 DIAGNOSIS — C801 Malignant (primary) neoplasm, unspecified: Secondary | ICD-10-CM | POA: Diagnosis present

## 2022-06-25 DIAGNOSIS — Z923 Personal history of irradiation: Secondary | ICD-10-CM | POA: Diagnosis not present

## 2022-06-25 DIAGNOSIS — Z515 Encounter for palliative care: Secondary | ICD-10-CM

## 2022-06-25 DIAGNOSIS — Z79899 Other long term (current) drug therapy: Secondary | ICD-10-CM | POA: Diagnosis not present

## 2022-06-25 DIAGNOSIS — C7951 Secondary malignant neoplasm of bone: Secondary | ICD-10-CM | POA: Diagnosis present

## 2022-06-25 LAB — CBC WITH DIFFERENTIAL/PLATELET
Abs Immature Granulocytes: 0.11 10*3/uL — ABNORMAL HIGH (ref 0.00–0.07)
Basophils Absolute: 0 10*3/uL (ref 0.0–0.1)
Basophils Relative: 0 %
Eosinophils Absolute: 0.1 10*3/uL (ref 0.0–0.5)
Eosinophils Relative: 0 %
HCT: 32.4 % — ABNORMAL LOW (ref 39.0–52.0)
Hemoglobin: 10.3 g/dL — ABNORMAL LOW (ref 13.0–17.0)
Immature Granulocytes: 1 %
Lymphocytes Relative: 48 %
Lymphs Abs: 7.2 10*3/uL — ABNORMAL HIGH (ref 0.7–4.0)
MCH: 25.2 pg — ABNORMAL LOW (ref 26.0–34.0)
MCHC: 31.8 g/dL (ref 30.0–36.0)
MCV: 79.4 fL — ABNORMAL LOW (ref 80.0–100.0)
Monocytes Absolute: 1 10*3/uL (ref 0.1–1.0)
Monocytes Relative: 7 %
Neutro Abs: 6.5 10*3/uL (ref 1.7–7.7)
Neutrophils Relative %: 44 %
Platelets: 323 10*3/uL (ref 150–400)
RBC: 4.08 MIL/uL — ABNORMAL LOW (ref 4.22–5.81)
RDW: 17.5 % — ABNORMAL HIGH (ref 11.5–15.5)
WBC: 14.8 10*3/uL — ABNORMAL HIGH (ref 4.0–10.5)
nRBC: 0 % (ref 0.0–0.2)

## 2022-06-25 LAB — RAPID URINE DRUG SCREEN, HOSP PERFORMED
Amphetamines: NOT DETECTED
Barbiturates: NOT DETECTED
Benzodiazepines: NOT DETECTED
Cocaine: NOT DETECTED
Opiates: NOT DETECTED
Tetrahydrocannabinol: POSITIVE — AB

## 2022-06-25 LAB — COMPREHENSIVE METABOLIC PANEL
ALT: 10 U/L (ref 0–44)
AST: 10 U/L — ABNORMAL LOW (ref 15–41)
Albumin: 3.6 g/dL (ref 3.5–5.0)
Alkaline Phosphatase: 90 U/L (ref 38–126)
Anion gap: 9 (ref 5–15)
BUN: 16 mg/dL (ref 6–20)
CO2: 25 mmol/L (ref 22–32)
Calcium: 9 mg/dL (ref 8.9–10.3)
Chloride: 105 mmol/L (ref 98–111)
Creatinine, Ser: 0.78 mg/dL (ref 0.61–1.24)
GFR, Estimated: >60 mL/min (ref >60)
Glucose, Bld: 108 mg/dL — ABNORMAL HIGH (ref 70–99)
Potassium: 3.1 mmol/L — ABNORMAL LOW (ref 3.5–5.1)
Sodium: 139 mmol/L (ref 135–145)
Total Bilirubin: 0.2 mg/dL — ABNORMAL LOW (ref 0.3–1.2)
Total Protein: 7.2 g/dL (ref 6.5–8.1)

## 2022-06-25 MED ORDER — SODIUM CHLORIDE 0.9% FLUSH
10.0000 mL | Freq: Once | INTRAVENOUS | Status: AC
  Administered 2022-06-25: 10 mL

## 2022-06-25 MED ORDER — HEPARIN SOD (PORK) LOCK FLUSH 100 UNIT/ML IV SOLN
500.0000 [IU] | Freq: Once | INTRAVENOUS | Status: AC
  Administered 2022-06-25: 500 [IU]

## 2022-06-25 MED ORDER — OXYCODONE HCL 15 MG PO TABS
15.0000 mg | ORAL_TABLET | ORAL | 0 refills | Status: DC | PRN
Start: 2022-06-25 — End: 2022-07-13

## 2022-06-25 MED ORDER — XTAMPZA ER 9 MG PO C12A
9.0000 mg | EXTENDED_RELEASE_CAPSULE | Freq: Two times a day (BID) | ORAL | 0 refills | Status: DC
Start: 2022-06-29 — End: 2022-06-28
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
## 2022-06-25 NOTE — Telephone Encounter (Signed)
Pt and family called asking for a refill of oxycodone. Pt missed last appt with palliative and a new one has been scheduled. Pt and family verbalize understanding of importance of coming to palliative appts. Pt also asked to reset MyChart passowrd. RN helped with that and pt and family verbalize understanding of how to use my chart. No further needs at this time.

## 2022-06-25 NOTE — Telephone Encounter (Signed)
Attempted to reach patient this morning to advise that we needed to cancel his visit with Dr Barbaraann Cao until he had his MRI completed and unable to leave voice message on his line.  He showed for visit with Dr Arbutus Ped but I was unable to speak directly with him while he was here to advise.    New phone number on file.  Attempted to reach patient that way, had to leave a voice message.  Pending call back.

## 2022-06-28 ENCOUNTER — Inpatient Hospital Stay (HOSPITAL_BASED_OUTPATIENT_CLINIC_OR_DEPARTMENT_OTHER): Payer: Medicaid Other | Admitting: Nurse Practitioner

## 2022-06-28 ENCOUNTER — Encounter: Payer: Self-pay | Admitting: Nurse Practitioner

## 2022-06-28 DIAGNOSIS — Z515 Encounter for palliative care: Secondary | ICD-10-CM

## 2022-06-28 DIAGNOSIS — C7931 Secondary malignant neoplasm of brain: Secondary | ICD-10-CM

## 2022-06-28 DIAGNOSIS — C3490 Malignant neoplasm of unspecified part of unspecified bronchus or lung: Secondary | ICD-10-CM

## 2022-06-28 DIAGNOSIS — M79604 Pain in right leg: Secondary | ICD-10-CM | POA: Diagnosis not present

## 2022-06-28 DIAGNOSIS — R530 Neoplastic (malignant) related fatigue: Secondary | ICD-10-CM | POA: Diagnosis not present

## 2022-06-28 DIAGNOSIS — C7951 Secondary malignant neoplasm of bone: Secondary | ICD-10-CM

## 2022-06-28 DIAGNOSIS — C479 Malignant neoplasm of peripheral nerves and autonomic nervous system, unspecified: Secondary | ICD-10-CM

## 2022-06-28 DIAGNOSIS — G893 Neoplasm related pain (acute) (chronic): Secondary | ICD-10-CM | POA: Diagnosis not present

## 2022-06-28 DIAGNOSIS — M79605 Pain in left leg: Secondary | ICD-10-CM

## 2022-06-28 DIAGNOSIS — C763 Malignant neoplasm of pelvis: Secondary | ICD-10-CM

## 2022-06-28 DIAGNOSIS — M792 Neuralgia and neuritis, unspecified: Secondary | ICD-10-CM | POA: Diagnosis not present

## 2022-06-28 MED ORDER — XTAMPZA ER 9 MG PO C12A
9.0000 mg | EXTENDED_RELEASE_CAPSULE | Freq: Three times a day (TID) | ORAL | 0 refills | Status: DC
Start: 2022-06-28 — End: 2022-07-26
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
## 2022-07-02 ENCOUNTER — Encounter: Payer: Self-pay | Admitting: Internal Medicine

## 2022-07-03 ENCOUNTER — Telehealth: Payer: Self-pay | Admitting: Internal Medicine

## 2022-07-03 NOTE — Telephone Encounter (Signed)
Called patient regarding May appointments, patient is notified.  

## 2022-07-05 DIAGNOSIS — M87051 Idiopathic aseptic necrosis of right femur: Secondary | ICD-10-CM | POA: Diagnosis not present

## 2022-07-11 ENCOUNTER — Other Ambulatory Visit: Payer: Self-pay | Admitting: *Deleted

## 2022-07-11 ENCOUNTER — Telehealth: Payer: Self-pay | Admitting: *Deleted

## 2022-07-11 DIAGNOSIS — C7931 Secondary malignant neoplasm of brain: Secondary | ICD-10-CM

## 2022-07-11 DIAGNOSIS — M79605 Pain in left leg: Secondary | ICD-10-CM

## 2022-07-11 DIAGNOSIS — M87051 Idiopathic aseptic necrosis of right femur: Secondary | ICD-10-CM | POA: Diagnosis not present

## 2022-07-11 MED ORDER — LEVETIRACETAM 1000 MG PO TABS
2000.0000 mg | ORAL_TABLET | Freq: Two times a day (BID) | ORAL | 3 refills | Status: DC
Start: 2022-07-11 — End: 2023-02-20

## 2022-07-11 MED ORDER — AMITRIPTYLINE HCL 100 MG PO TABS
100.0000 mg | ORAL_TABLET | Freq: Every day | ORAL | 1 refills | Status: DC
Start: 2022-07-11 — End: 2022-12-25

## 2022-07-11 NOTE — Telephone Encounter (Signed)
Received VM from patient requesting refill for seizure medication & "any other drugs I get from Dr Barbaraann Cao."  Refills for Keppra & Elavil forwarded to MD.

## 2022-07-13 ENCOUNTER — Other Ambulatory Visit: Payer: Self-pay

## 2022-07-13 DIAGNOSIS — C763 Malignant neoplasm of pelvis: Secondary | ICD-10-CM

## 2022-07-13 DIAGNOSIS — G893 Neoplasm related pain (acute) (chronic): Secondary | ICD-10-CM

## 2022-07-13 DIAGNOSIS — M79605 Pain in left leg: Secondary | ICD-10-CM

## 2022-07-13 DIAGNOSIS — C7931 Secondary malignant neoplasm of brain: Secondary | ICD-10-CM

## 2022-07-13 DIAGNOSIS — Z515 Encounter for palliative care: Secondary | ICD-10-CM

## 2022-07-13 DIAGNOSIS — C3490 Malignant neoplasm of unspecified part of unspecified bronchus or lung: Secondary | ICD-10-CM

## 2022-07-13 MED ORDER — OXYCODONE HCL 15 MG PO TABS
15.0000 mg | ORAL_TABLET | ORAL | 0 refills | Status: DC | PRN
Start: 2022-07-16 — End: 2022-07-26

## 2022-07-13 NOTE — Telephone Encounter (Signed)
Pt called for refill, see new orders.  

## 2022-07-17 ENCOUNTER — Other Ambulatory Visit: Payer: Self-pay | Admitting: Internal Medicine

## 2022-07-23 ENCOUNTER — Encounter (HOSPITAL_COMMUNITY): Payer: Self-pay

## 2022-07-23 ENCOUNTER — Inpatient Hospital Stay: Payer: Medicaid Other | Attending: Internal Medicine

## 2022-07-23 ENCOUNTER — Ambulatory Visit (HOSPITAL_COMMUNITY): Payer: Medicaid Other

## 2022-07-23 DIAGNOSIS — C7951 Secondary malignant neoplasm of bone: Secondary | ICD-10-CM | POA: Insufficient documentation

## 2022-07-23 DIAGNOSIS — Z923 Personal history of irradiation: Secondary | ICD-10-CM | POA: Insufficient documentation

## 2022-07-23 DIAGNOSIS — Z515 Encounter for palliative care: Secondary | ICD-10-CM | POA: Insufficient documentation

## 2022-07-23 DIAGNOSIS — C7931 Secondary malignant neoplasm of brain: Secondary | ICD-10-CM | POA: Insufficient documentation

## 2022-07-23 DIAGNOSIS — C801 Malignant (primary) neoplasm, unspecified: Secondary | ICD-10-CM | POA: Insufficient documentation

## 2022-07-23 DIAGNOSIS — F17210 Nicotine dependence, cigarettes, uncomplicated: Secondary | ICD-10-CM | POA: Insufficient documentation

## 2022-07-23 DIAGNOSIS — Z9221 Personal history of antineoplastic chemotherapy: Secondary | ICD-10-CM | POA: Insufficient documentation

## 2022-07-23 DIAGNOSIS — Z79899 Other long term (current) drug therapy: Secondary | ICD-10-CM | POA: Insufficient documentation

## 2022-07-23 DIAGNOSIS — F1290 Cannabis use, unspecified, uncomplicated: Secondary | ICD-10-CM | POA: Insufficient documentation

## 2022-07-23 DIAGNOSIS — G893 Neoplasm related pain (acute) (chronic): Secondary | ICD-10-CM | POA: Insufficient documentation

## 2022-07-23 DIAGNOSIS — Z791 Long term (current) use of non-steroidal anti-inflammatories (NSAID): Secondary | ICD-10-CM | POA: Insufficient documentation

## 2022-07-24 ENCOUNTER — Other Ambulatory Visit: Payer: Medicaid Other

## 2022-07-24 ENCOUNTER — Telehealth: Payer: Self-pay | Admitting: Internal Medicine

## 2022-07-24 ENCOUNTER — Telehealth: Payer: Self-pay | Admitting: Physician Assistant

## 2022-07-25 ENCOUNTER — Other Ambulatory Visit: Payer: Self-pay

## 2022-07-25 ENCOUNTER — Inpatient Hospital Stay: Payer: Medicaid Other

## 2022-07-25 ENCOUNTER — Ambulatory Visit (HOSPITAL_BASED_OUTPATIENT_CLINIC_OR_DEPARTMENT_OTHER): Admission: RE | Admit: 2022-07-25 | Payer: Medicaid Other | Source: Ambulatory Visit

## 2022-07-25 DIAGNOSIS — C7951 Secondary malignant neoplasm of bone: Secondary | ICD-10-CM | POA: Diagnosis present

## 2022-07-25 DIAGNOSIS — C801 Malignant (primary) neoplasm, unspecified: Secondary | ICD-10-CM | POA: Diagnosis present

## 2022-07-25 DIAGNOSIS — Z79899 Other long term (current) drug therapy: Secondary | ICD-10-CM | POA: Diagnosis not present

## 2022-07-25 DIAGNOSIS — C7931 Secondary malignant neoplasm of brain: Secondary | ICD-10-CM | POA: Diagnosis present

## 2022-07-25 DIAGNOSIS — G893 Neoplasm related pain (acute) (chronic): Secondary | ICD-10-CM | POA: Diagnosis not present

## 2022-07-25 DIAGNOSIS — C3490 Malignant neoplasm of unspecified part of unspecified bronchus or lung: Secondary | ICD-10-CM

## 2022-07-25 DIAGNOSIS — Z791 Long term (current) use of non-steroidal anti-inflammatories (NSAID): Secondary | ICD-10-CM | POA: Diagnosis not present

## 2022-07-25 DIAGNOSIS — F17210 Nicotine dependence, cigarettes, uncomplicated: Secondary | ICD-10-CM | POA: Diagnosis not present

## 2022-07-25 DIAGNOSIS — F1290 Cannabis use, unspecified, uncomplicated: Secondary | ICD-10-CM | POA: Diagnosis not present

## 2022-07-25 DIAGNOSIS — Z515 Encounter for palliative care: Secondary | ICD-10-CM | POA: Diagnosis not present

## 2022-07-25 DIAGNOSIS — Z923 Personal history of irradiation: Secondary | ICD-10-CM | POA: Diagnosis not present

## 2022-07-25 DIAGNOSIS — Z9221 Personal history of antineoplastic chemotherapy: Secondary | ICD-10-CM | POA: Diagnosis not present

## 2022-07-25 LAB — CBC WITH DIFFERENTIAL (CANCER CENTER ONLY)
Abs Immature Granulocytes: 0.07 10*3/uL (ref 0.00–0.07)
Basophils Absolute: 0 10*3/uL (ref 0.0–0.1)
Basophils Relative: 0 %
Eosinophils Absolute: 0.1 10*3/uL (ref 0.0–0.5)
Eosinophils Relative: 1 %
HCT: 32.8 % — ABNORMAL LOW (ref 39.0–52.0)
Hemoglobin: 10.5 g/dL — ABNORMAL LOW (ref 13.0–17.0)
Immature Granulocytes: 1 %
Lymphocytes Relative: 41 %
Lymphs Abs: 5.1 10*3/uL — ABNORMAL HIGH (ref 0.7–4.0)
MCH: 26 pg (ref 26.0–34.0)
MCHC: 32 g/dL (ref 30.0–36.0)
MCV: 81.2 fL (ref 80.0–100.0)
Monocytes Absolute: 0.9 10*3/uL (ref 0.1–1.0)
Monocytes Relative: 7 %
Neutro Abs: 6.2 10*3/uL (ref 1.7–7.7)
Neutrophils Relative %: 50 %
Platelet Count: 285 10*3/uL (ref 150–400)
RBC: 4.04 MIL/uL — ABNORMAL LOW (ref 4.22–5.81)
RDW: 19.7 % — ABNORMAL HIGH (ref 11.5–15.5)
WBC Count: 12.4 10*3/uL — ABNORMAL HIGH (ref 4.0–10.5)
nRBC: 0 % (ref 0.0–0.2)

## 2022-07-25 LAB — CMP (CANCER CENTER ONLY)
ALT: 14 U/L (ref 0–44)
AST: 11 U/L — ABNORMAL LOW (ref 15–41)
Albumin: 3.6 g/dL (ref 3.5–5.0)
Alkaline Phosphatase: 88 U/L (ref 38–126)
Anion gap: 7 (ref 5–15)
BUN: 11 mg/dL (ref 6–20)
CO2: 28 mmol/L (ref 22–32)
Calcium: 8.6 mg/dL — ABNORMAL LOW (ref 8.9–10.3)
Chloride: 106 mmol/L (ref 98–111)
Creatinine: 0.69 mg/dL (ref 0.61–1.24)
GFR, Estimated: >60 mL/min (ref >60)
Glucose, Bld: 117 mg/dL — ABNORMAL HIGH (ref 70–99)
Potassium: 3.5 mmol/L (ref 3.5–5.1)
Sodium: 141 mmol/L (ref 135–145)
Total Bilirubin: 0.2 mg/dL — ABNORMAL LOW (ref 0.3–1.2)
Total Protein: 6.9 g/dL (ref 6.5–8.1)
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
## 2022-07-26 ENCOUNTER — Inpatient Hospital Stay (HOSPITAL_BASED_OUTPATIENT_CLINIC_OR_DEPARTMENT_OTHER): Payer: Medicaid Other | Admitting: Nurse Practitioner

## 2022-07-26 ENCOUNTER — Inpatient Hospital Stay: Payer: Medicaid Other | Admitting: Physician Assistant

## 2022-07-26 ENCOUNTER — Ambulatory Visit (HOSPITAL_COMMUNITY): Payer: Medicaid Other

## 2022-07-26 ENCOUNTER — Telehealth: Payer: Self-pay

## 2022-07-26 ENCOUNTER — Encounter: Payer: Self-pay | Admitting: Nurse Practitioner

## 2022-07-26 ENCOUNTER — Telehealth: Payer: Self-pay | Admitting: Internal Medicine

## 2022-07-26 ENCOUNTER — Telehealth: Payer: Self-pay | Admitting: Nurse Practitioner

## 2022-07-26 VITALS — BP 118/74 | HR 79 | Temp 98.2°F | Resp 18

## 2022-07-26 DIAGNOSIS — M79605 Pain in left leg: Secondary | ICD-10-CM

## 2022-07-26 DIAGNOSIS — C763 Malignant neoplasm of pelvis: Secondary | ICD-10-CM | POA: Diagnosis not present

## 2022-07-26 DIAGNOSIS — K5903 Drug induced constipation: Secondary | ICD-10-CM

## 2022-07-26 DIAGNOSIS — Z515 Encounter for palliative care: Secondary | ICD-10-CM

## 2022-07-26 DIAGNOSIS — G893 Neoplasm related pain (acute) (chronic): Secondary | ICD-10-CM | POA: Diagnosis not present

## 2022-07-26 DIAGNOSIS — C7931 Secondary malignant neoplasm of brain: Secondary | ICD-10-CM | POA: Diagnosis not present

## 2022-07-26 DIAGNOSIS — C3490 Malignant neoplasm of unspecified part of unspecified bronchus or lung: Secondary | ICD-10-CM

## 2022-07-26 DIAGNOSIS — M79604 Pain in right leg: Secondary | ICD-10-CM

## 2022-07-26 MED ORDER — XTAMPZA ER 9 MG PO C12A
9.0000 mg | EXTENDED_RELEASE_CAPSULE | Freq: Three times a day (TID) | ORAL | 0 refills | Status: DC
Start: 2022-07-27 — End: 2022-08-22

## 2022-07-26 MED ORDER — OXYCODONE HCL 15 MG PO TABS
15.0000 mg | ORAL_TABLET | ORAL | 0 refills | Status: DC | PRN
Start: 2022-08-04 — End: 2022-08-22

## 2022-07-26 NOTE — Telephone Encounter (Signed)
This nurse left a message for this patient and advised that his office visit will have to be rescheduled due to patient missing his CT scan on 07/25/22.  Provided phone number for central scheduling and made aware that a scheduler from our office will reach out to reschedule his office visit.   No further concerns noted at this time.

## 2022-07-26 NOTE — Progress Notes (Signed)
Patient's CT scan rescheduled in [place] per patients request.  Patient has been notified of his appointment date and time and to arrive at 345 pm and to have nothing to eat or drink except water after 12 noon.  Also made aware of office visit that has been scheduled for Thursday 5/23 at 1115 am.  No questions or concerns noted at this time.

## 2022-07-27 ENCOUNTER — Ambulatory Visit (HOSPITAL_BASED_OUTPATIENT_CLINIC_OR_DEPARTMENT_OTHER): Admission: RE | Admit: 2022-07-27 | Payer: Medicaid Other | Source: Ambulatory Visit

## 2022-07-29 ENCOUNTER — Ambulatory Visit (HOSPITAL_BASED_OUTPATIENT_CLINIC_OR_DEPARTMENT_OTHER)
Admission: RE | Admit: 2022-07-29 | Discharge: 2022-07-29 | Disposition: A | Discharge status: Home / Self Care | Payer: Medicaid Other | Source: Ambulatory Visit | Attending: Internal Medicine | Admitting: Internal Medicine

## 2022-07-29 DIAGNOSIS — C3490 Malignant neoplasm of unspecified part of unspecified bronchus or lung: Secondary | ICD-10-CM | POA: Insufficient documentation

## 2022-07-29 MED ORDER — IOHEXOL 300 MG/ML  SOLN
100.0000 mL | Freq: Once | INTRAMUSCULAR | Status: AC | PRN
  Administered 2022-07-29: 100 mL via INTRAVENOUS

## 2022-08-02 ENCOUNTER — Inpatient Hospital Stay (HOSPITAL_BASED_OUTPATIENT_CLINIC_OR_DEPARTMENT_OTHER): Payer: Medicaid Other | Admitting: Internal Medicine

## 2022-08-02 DIAGNOSIS — C7931 Secondary malignant neoplasm of brain: Secondary | ICD-10-CM | POA: Diagnosis not present

## 2022-08-02 DIAGNOSIS — C3490 Malignant neoplasm of unspecified part of unspecified bronchus or lung: Secondary | ICD-10-CM
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
## 2022-08-07 ENCOUNTER — Other Ambulatory Visit: Payer: Self-pay | Admitting: Radiation Therapy

## 2022-08-08 ENCOUNTER — Other Ambulatory Visit: Payer: Self-pay | Admitting: Internal Medicine

## 2022-08-08 DIAGNOSIS — C7931 Secondary malignant neoplasm of brain: Secondary | ICD-10-CM

## 2022-08-11 DIAGNOSIS — M87051 Idiopathic aseptic necrosis of right femur: Secondary | ICD-10-CM | POA: Diagnosis not present

## 2022-08-14 ENCOUNTER — Other Ambulatory Visit: Payer: Self-pay | Admitting: Nurse Practitioner

## 2022-08-14 DIAGNOSIS — M79604 Pain in right leg: Secondary | ICD-10-CM

## 2022-08-14 DIAGNOSIS — C763 Malignant neoplasm of pelvis: Secondary | ICD-10-CM

## 2022-08-14 DIAGNOSIS — Z515 Encounter for palliative care: Secondary | ICD-10-CM

## 2022-08-14 DIAGNOSIS — C7931 Secondary malignant neoplasm of brain: Secondary | ICD-10-CM

## 2022-08-14 DIAGNOSIS — C3490 Malignant neoplasm of unspecified part of unspecified bronchus or lung: Secondary | ICD-10-CM

## 2022-08-22 ENCOUNTER — Other Ambulatory Visit: Payer: Self-pay | Admitting: Nurse Practitioner

## 2022-08-22 ENCOUNTER — Telehealth: Payer: Self-pay | Admitting: Medical Oncology

## 2022-08-22 ENCOUNTER — Telehealth: Payer: Self-pay | Admitting: Internal Medicine

## 2022-08-22 DIAGNOSIS — M79604 Pain in right leg: Secondary | ICD-10-CM

## 2022-08-22 DIAGNOSIS — C3490 Malignant neoplasm of unspecified part of unspecified bronchus or lung: Secondary | ICD-10-CM

## 2022-08-22 DIAGNOSIS — G893 Neoplasm related pain (acute) (chronic): Secondary | ICD-10-CM

## 2022-08-22 DIAGNOSIS — C763 Malignant neoplasm of pelvis: Secondary | ICD-10-CM

## 2022-08-22 DIAGNOSIS — C7931 Secondary malignant neoplasm of brain: Secondary | ICD-10-CM

## 2022-08-22 DIAGNOSIS — Z515 Encounter for palliative care: Secondary | ICD-10-CM

## 2022-08-22 MED ORDER — OXYCODONE HCL 15 MG PO TABS
15.0000 mg | ORAL_TABLET | ORAL | 0 refills | Status: DC | PRN
Start: 2022-08-24 — End: 2022-09-17

## 2022-08-22 MED ORDER — XTAMPZA ER 9 MG PO C12A
9.0000 mg | EXTENDED_RELEASE_CAPSULE | Freq: Three times a day (TID) | ORAL | 0 refills | Status: DC
Start: 2022-08-25 — End: 2022-10-24

## 2022-08-22 NOTE — Telephone Encounter (Signed)
Requested refill for Oxycodone 15 mg tablets.

## 2022-08-22 NOTE — Telephone Encounter (Signed)
Patient is aware of upcoming appointment times/dates.  
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
## 2022-09-05 ENCOUNTER — Other Ambulatory Visit: Payer: Self-pay | Admitting: Internal Medicine

## 2022-09-05 DIAGNOSIS — C7931 Secondary malignant neoplasm of brain: Secondary | ICD-10-CM

## 2022-09-06 ENCOUNTER — Inpatient Hospital Stay: Payer: Medicaid Other | Attending: Internal Medicine | Admitting: Nurse Practitioner

## 2022-09-06 ENCOUNTER — Encounter: Payer: Self-pay | Admitting: Nurse Practitioner

## 2022-09-06 DIAGNOSIS — Z515 Encounter for palliative care: Secondary | ICD-10-CM

## 2022-09-06 DIAGNOSIS — C7951 Secondary malignant neoplasm of bone: Secondary | ICD-10-CM | POA: Diagnosis not present

## 2022-09-06 DIAGNOSIS — G893 Neoplasm related pain (acute) (chronic): Secondary | ICD-10-CM

## 2022-09-06 DIAGNOSIS — K5903 Drug induced constipation: Secondary | ICD-10-CM | POA: Diagnosis not present

## 2022-09-07 ENCOUNTER — Other Ambulatory Visit: Payer: Self-pay

## 2022-09-07 MED ORDER — DULOXETINE HCL 30 MG PO CPEP: 30.0000 mg | ORAL_CAPSULE | Freq: Every day | ORAL | 3 refills | Status: DC

## 2022-09-07 NOTE — Telephone Encounter (Signed)
Cymbalta refill sent to pt pharmacy per Lowella Bandy, NP

## 2022-09-10 DIAGNOSIS — M87051 Idiopathic aseptic necrosis of right femur: Secondary | ICD-10-CM | POA: Diagnosis not present

## 2022-09-14 ENCOUNTER — Inpatient Hospital Stay: Admission: RE | Admit: 2022-09-14 | Payer: Medicaid Other | Source: Ambulatory Visit

## 2022-09-17 ENCOUNTER — Other Ambulatory Visit: Payer: Self-pay

## 2022-09-17 DIAGNOSIS — G893 Neoplasm related pain (acute) (chronic): Secondary | ICD-10-CM

## 2022-09-17 DIAGNOSIS — C7931 Secondary malignant neoplasm of brain: Secondary | ICD-10-CM

## 2022-09-17 DIAGNOSIS — C763 Malignant neoplasm of pelvis: Secondary | ICD-10-CM

## 2022-09-17 DIAGNOSIS — C3490 Malignant neoplasm of unspecified part of unspecified bronchus or lung: Secondary | ICD-10-CM

## 2022-09-17 DIAGNOSIS — Z515 Encounter for palliative care: Secondary | ICD-10-CM

## 2022-09-17 DIAGNOSIS — M79604 Pain in right leg: Secondary | ICD-10-CM

## 2022-09-17 MED ORDER — OXYCODONE HCL 15 MG PO TABS
15.0000 mg | ORAL_TABLET | ORAL | 0 refills | Status: DC | PRN
Start: 2022-09-17 — End: 2022-10-09

## 2022-09-17 NOTE — Telephone Encounter (Signed)
Pt called for a refill of his Oxy IR, no further needs at this time.

## 2022-09-19 ENCOUNTER — Telehealth: Payer: Self-pay | Admitting: *Deleted
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
## 2022-09-19 NOTE — Telephone Encounter (Signed)
PC to patient, informed him we need to change his appointment with Dr Barbaraann Cao since he has brain MRI on 09/26/22.  Dr Barbaraann Cao wants to F/U with him after his scan.  Informed patient scheduling will contact him to reschedule appointment.  He verbalizes understanding.  Scheduling message sent. Appointment for 09/20/22 canceled.

## 2022-09-20 ENCOUNTER — Inpatient Hospital Stay: Payer: Medicaid Other | Admitting: Nurse Practitioner

## 2022-09-20 ENCOUNTER — Inpatient Hospital Stay: Payer: Medicaid Other | Admitting: Internal Medicine

## 2022-09-24 ENCOUNTER — Telehealth: Payer: Self-pay | Admitting: Internal Medicine

## 2022-09-24 NOTE — Telephone Encounter (Signed)
Left patient a message regarding next follow up appointment with dates/time

## 2022-09-26 ENCOUNTER — Other Ambulatory Visit: Payer: Medicaid Other

## 2022-10-01 ENCOUNTER — Inpatient Hospital Stay: Payer: Medicaid Other | Attending: Internal Medicine
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
## 2022-10-04 ENCOUNTER — Inpatient Hospital Stay: Payer: Medicaid Other | Admitting: Internal Medicine

## 2022-10-04 ENCOUNTER — Inpatient Hospital Stay: Payer: Medicaid Other | Admitting: Nurse Practitioner

## 2022-10-08 ENCOUNTER — Other Ambulatory Visit: Payer: Self-pay | Admitting: Nurse Practitioner

## 2022-10-08 ENCOUNTER — Telehealth: Payer: Self-pay

## 2022-10-08 DIAGNOSIS — C763 Malignant neoplasm of pelvis: Secondary | ICD-10-CM

## 2022-10-08 DIAGNOSIS — G893 Neoplasm related pain (acute) (chronic): Secondary | ICD-10-CM

## 2022-10-08 DIAGNOSIS — C3490 Malignant neoplasm of unspecified part of unspecified bronchus or lung: Secondary | ICD-10-CM

## 2022-10-08 DIAGNOSIS — C7931 Secondary malignant neoplasm of brain: Secondary | ICD-10-CM

## 2022-10-08 DIAGNOSIS — M79605 Pain in left leg: Secondary | ICD-10-CM

## 2022-10-08 DIAGNOSIS — M79604 Pain in right leg: Secondary | ICD-10-CM

## 2022-10-08 DIAGNOSIS — Z515 Encounter for palliative care: Secondary | ICD-10-CM

## 2022-10-08 NOTE — Telephone Encounter (Signed)
Pt called and LVM for refill, patient was a no-show at last appointment and due to that he cannot receive a refill until he is seen by Lowella Bandy, NP to be assessed for effectiveness and also any potential side effects. The last time pt was seen in person by the NP is 07/26/2022. This RN attempted to call pt to explain this a schedule an appointment, no answer, LVM and callback number.
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
## 2022-10-08 NOTE — Telephone Encounter (Signed)
Pt returned call, RN explained the need for an in-person visit, pt voiced understanding and I scheduled to come in tomorrow. No further needs at this time.

## 2022-10-09 ENCOUNTER — Inpatient Hospital Stay (HOSPITAL_BASED_OUTPATIENT_CLINIC_OR_DEPARTMENT_OTHER): Payer: Medicaid Other | Admitting: Nurse Practitioner

## 2022-10-09 ENCOUNTER — Encounter: Payer: Self-pay | Admitting: Nurse Practitioner

## 2022-10-09 VITALS — BP 115/76 | HR 92 | Temp 98.8°F | Resp 18 | Ht 70.0 in | Wt 163.9 lb

## 2022-10-09 DIAGNOSIS — M79604 Pain in right leg: Secondary | ICD-10-CM

## 2022-10-09 DIAGNOSIS — C7931 Secondary malignant neoplasm of brain: Secondary | ICD-10-CM

## 2022-10-09 DIAGNOSIS — Z515 Encounter for palliative care: Secondary | ICD-10-CM | POA: Diagnosis not present

## 2022-10-09 DIAGNOSIS — G893 Neoplasm related pain (acute) (chronic): Secondary | ICD-10-CM

## 2022-10-09 DIAGNOSIS — C3490 Malignant neoplasm of unspecified part of unspecified bronchus or lung: Secondary | ICD-10-CM

## 2022-10-09 DIAGNOSIS — C763 Malignant neoplasm of pelvis: Secondary | ICD-10-CM | POA: Diagnosis not present

## 2022-10-09 DIAGNOSIS — M79605 Pain in left leg: Secondary | ICD-10-CM

## 2022-10-09 MED ORDER — OXYCODONE HCL 15 MG PO TABS
15.0000 mg | ORAL_TABLET | ORAL | 0 refills | Status: DC | PRN
Start: 2022-10-09 — End: 2022-10-24

## 2022-10-10 ENCOUNTER — Encounter: Payer: Self-pay | Admitting: Internal Medicine

## 2022-10-10 ENCOUNTER — Ambulatory Visit
Admission: RE | Admit: 2022-10-10 | Discharge: 2022-10-10 | Disposition: A | Discharge status: Home / Self Care | Payer: Medicaid Other | Source: Ambulatory Visit | Attending: Internal Medicine | Admitting: Internal Medicine

## 2022-10-10 DIAGNOSIS — C7931 Secondary malignant neoplasm of brain: Secondary | ICD-10-CM

## 2022-10-10 DIAGNOSIS — Z85118 Personal history of other malignant neoplasm of bronchus and lung: Secondary | ICD-10-CM | POA: Diagnosis not present

## 2022-10-10 DIAGNOSIS — G939 Disorder of brain, unspecified: Secondary | ICD-10-CM | POA: Diagnosis not present

## 2022-10-10 MED ORDER — GADOPICLENOL 0.5 MMOL/ML IV SOLN
7.0000 mL | Freq: Once | INTRAVENOUS | Status: AC | PRN
  Administered 2022-10-10: 7 mL via INTRAVENOUS

## 2022-10-11 DIAGNOSIS — M87051 Idiopathic aseptic necrosis of right femur: Secondary | ICD-10-CM | POA: Diagnosis not present

## 2022-10-18 ENCOUNTER — Other Ambulatory Visit: Payer: Self-pay | Admitting: Internal Medicine

## 2022-10-18 ENCOUNTER — Inpatient Hospital Stay: Payer: Medicaid Other | Attending: Internal Medicine | Admitting: Internal Medicine

## 2022-10-18 DIAGNOSIS — C7931 Secondary malignant neoplasm of brain: Secondary | ICD-10-CM | POA: Diagnosis not present

## 2022-10-18 MED ORDER — GABAPENTIN 800 MG PO TABS: 800.0000 mg | ORAL_TABLET | Freq: Three times a day (TID) | ORAL | 2 refills | Status: DC

## 2022-10-18 MED ORDER — DEXAMETHASONE 4 MG PO TABS
4.0000 mg | ORAL_TABLET | Freq: Every day | ORAL | 1 refills | Status: DC
Start: 2022-10-18 — End: 2023-01-28

## 2022-10-18 NOTE — Progress Notes (Signed)
I connected with Eric Mason on 10/18/22 at  3:00 PM EDT by telephone visit and verified that I am speaking with the correct person using two identifiers.  I discussed the limitations, risks, security and privacy concerns of performing an evaluation and management service by telemedicine and the availability of in-person appointments. I also discussed with the patient that there may be a patient responsible charge related to this service. The patient expressed understanding and agreed to proceed.  Other persons participating in the visit and their role in the encounter:  n/a  Patient's location:  Home Provider's location:  Office Chief Complaint:  Malignant neoplasm metastatic to brain [facility])  History of Present Ilness: Eric Mason reports ongoing issues with his left leg.  He feels it may be somewhat weaker now compared to prior.  Still having lot of pain.  Continues on decadron 4mg  daily as prior, as well as gabapentin.  Observations: Language and cognition at baseline  Assessment and Plan: Malignant neoplasm metastatic to brain [facility]  Imaging:  [facility] Clinician Interpretation: I have personally reviewed the CNS images as listed.  My interpretation, in the context of the patient's clinical presentation, is progressive disease within previously treated right temporal metastasis.  Pending official read.  No results found.  Follow Up Instructions:  Progression represents either organic PD or radiation necrosis.  Recommended FDG-PET for further characterization, and evaluation in CNS tumor board.  Pending PET results, he may be a re-resection candidate, or L[facility] referral.  Will follow up after PET is complete.  Con't dex 4mg  daily and gabapentin.  I discussed the assessment and treatment plan with the patient.  The patient was provided an opportunity to ask questions and all were answered.  The patient agreed with the plan and demonstrated understanding of the instructions.     The patient was advised to call back or seek an in-person evaluation if the symptoms worsen or if the condition fails to improve as anticipated.    Henreitta Leber, MD   I provided 22 minutes of non face-to-face telephone visit time during this encounter, and > 50% was spent counseling as documented under my assessment & plan.

## 2022-10-22 ENCOUNTER — Other Ambulatory Visit: Payer: Self-pay | Admitting: Internal Medicine

## 2022-10-22 DIAGNOSIS — C7931 Secondary malignant neoplasm of brain: Secondary | ICD-10-CM

## 2022-10-23 ENCOUNTER — Telehealth: Payer: Self-pay | Admitting: Internal Medicine

## 2022-10-23 NOTE — Telephone Encounter (Signed)
Scheduled per 08/13 scheduling message, patient has been called and notified.

## 2022-10-24 ENCOUNTER — Other Ambulatory Visit: Payer: Self-pay

## 2022-10-24 ENCOUNTER — Other Ambulatory Visit: Payer: Self-pay | Admitting: Nurse Practitioner

## 2022-10-24 DIAGNOSIS — C7931 Secondary malignant neoplasm of brain: Secondary | ICD-10-CM

## 2022-10-24 DIAGNOSIS — C763 Malignant neoplasm of pelvis: Secondary | ICD-10-CM

## 2022-10-24 DIAGNOSIS — G893 Neoplasm related pain (acute) (chronic): Secondary | ICD-10-CM

## 2022-10-24 DIAGNOSIS — Z515 Encounter for palliative care: Secondary | ICD-10-CM

## 2022-10-24 DIAGNOSIS — M79604 Pain in right leg: Secondary | ICD-10-CM

## 2022-10-24 DIAGNOSIS — C3490 Malignant neoplasm of unspecified part of unspecified bronchus or lung: Secondary | ICD-10-CM

## 2022-10-24 MED ORDER — OXYCODONE HCL 15 MG PO TABS
15.0000 mg | ORAL_TABLET | ORAL | 0 refills | Status: DC | PRN
Start: 2022-10-29 — End: 2022-11-15

## 2022-10-24 NOTE — Telephone Encounter (Signed)
Pt called for med refill see attached orders

## 2022-10-25 ENCOUNTER — Encounter: Payer: Self-pay | Admitting: Internal Medicine

## 2022-10-25 NOTE — Telephone Encounter (Signed)
Thayer Ohm called to confirm appt with Lowella Bandy and Dr Barbaraann Cao

## 2022-10-29 ENCOUNTER — Other Ambulatory Visit: Payer: Self-pay

## 2022-11-05 ENCOUNTER — Inpatient Hospital Stay: Payer: Medicaid Other | Admitting: Nurse Practitioner
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
## 2022-11-08 ENCOUNTER — Telehealth: Payer: Self-pay

## 2022-11-08 NOTE — Telephone Encounter (Signed)
Pt called to reschedule missed appointment, attempted to call pt, LVM of date/time and call back number.

## 2022-11-11 DIAGNOSIS — M87051 Idiopathic aseptic necrosis of right femur: Secondary | ICD-10-CM | POA: Diagnosis not present
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
## 2022-11-15 ENCOUNTER — Encounter: Payer: Self-pay | Admitting: Nurse Practitioner

## 2022-11-15 ENCOUNTER — Inpatient Hospital Stay: Payer: Medicaid Other | Attending: Internal Medicine | Admitting: Nurse Practitioner

## 2022-11-15 VITALS — BP 110/75 | HR 102 | Temp 98.4°F | Resp 16 | Wt 170.8 lb

## 2022-11-15 DIAGNOSIS — C7951 Secondary malignant neoplasm of bone: Secondary | ICD-10-CM | POA: Diagnosis not present

## 2022-11-15 DIAGNOSIS — C7931 Secondary malignant neoplasm of brain: Secondary | ICD-10-CM | POA: Diagnosis not present

## 2022-11-15 DIAGNOSIS — G893 Neoplasm related pain (acute) (chronic): Secondary | ICD-10-CM | POA: Diagnosis not present

## 2022-11-15 DIAGNOSIS — M79604 Pain in right leg: Secondary | ICD-10-CM | POA: Diagnosis not present

## 2022-11-15 DIAGNOSIS — M79605 Pain in left leg: Secondary | ICD-10-CM

## 2022-11-15 DIAGNOSIS — C763 Malignant neoplasm of pelvis: Secondary | ICD-10-CM | POA: Diagnosis not present

## 2022-11-15 DIAGNOSIS — Z515 Encounter for palliative care: Secondary | ICD-10-CM

## 2022-11-15 DIAGNOSIS — C3490 Malignant neoplasm of unspecified part of unspecified bronchus or lung: Secondary | ICD-10-CM | POA: Diagnosis not present

## 2022-11-15 MED ORDER — XTAMPZA ER 9 MG PO C12A
1.0000 | EXTENDED_RELEASE_CAPSULE | Freq: Three times a day (TID) | ORAL | 0 refills | Status: DC
Start: 2022-11-23 — End: 2023-01-07

## 2022-11-15 MED ORDER — DULOXETINE HCL 30 MG PO CPEP
30.0000 mg | ORAL_CAPSULE | Freq: Every day | ORAL | 3 refills | Status: DC
Start: 2022-11-15 — End: 2023-03-05

## 2022-11-15 MED ORDER — DICLOFENAC SODIUM 1 % EX GEL: 2.0000 g | Freq: Four times a day (QID) | CUTANEOUS | 4 refills | Status: AC | PRN

## 2022-11-15 MED ORDER — OXYCODONE HCL 15 MG PO TABS: 15.0000 mg | ORAL_TABLET | ORAL | 0 refills | Status: DC | PRN

## 2022-11-15 MED ORDER — MELOXICAM 7.5 MG PO TABS: 15.0000 mg | ORAL_TABLET | Freq: Every day | ORAL | 2 refills | Status: DC

## 2022-11-15 NOTE — Patient Instructions (Signed)
-   pick up and take voltaren pills twice a day as needed for arthritic pain - continue your voltaren gel, oxycodone, xtampza, and cymbalta as prescribed

## 2022-11-16 ENCOUNTER — Encounter: Payer: Self-pay | Admitting: Internal Medicine

## 2022-11-21 ENCOUNTER — Encounter: Payer: Self-pay | Admitting: Nurse Practitioner

## 2022-11-21 ENCOUNTER — Telehealth: Payer: Self-pay | Admitting: *Deleted

## 2022-11-21 ENCOUNTER — Telehealth: Payer: Self-pay | Admitting: Nurse Practitioner

## 2022-11-21 NOTE — Telephone Encounter (Signed)
I connected with Eric Mason on @TODAY @ at  by @VIRTUALVISITMETHODCHOICE @ and verified that I am speaking with the correct person using two identifiers.   I discussed the limitations, risks, security and privacy concerns of performing an evaluation and management service by telemedicine and the availability of in-person appointments. I also discussed with the patient that there may be a patient responsible charge related to this service. The patient expressed understanding and agreed to proceed.   Other persons participating in the visit and their role in the encounter: Eric Mason   Patient's location: home  Provider's location: [facility]   I connected by phone with [name]. Patient contacted office inquiring about prescription cost and to request new Rolator due to his current device malfunctioning. States the seat has broken. Unable to safely use.   I have sent in new request for Rolator via [employer]. Patient is aware. Prior authorization was completed on Saturday 9/7 when received by pharmacy. Advised patient unsure of changes with his insurance. I recommended he reach out to his insurance to make sure that there has not been any changes. Spoke with Natasha Mead with our PA team who also will assist in following up. [name] verbalized understanding.

## 2022-11-21 NOTE — Telephone Encounter (Signed)
Received call from pt stating that he picked up script for oxy 15 mg on Fri but pharmacy told him he could only get 5 days worth so had to pay out of pocket for full script.  He asked to speak to [facility] NP.  He also report needing a new walker.  States he has a rolling walker that he can sit on & it fell through.  He states that he has gained a little weight due to steroids & needs a heavier duty walker.  States he uses [employer].   Message to [facility] & Dr Arbutus Ped pod.

## 2022-11-22 ENCOUNTER — Telehealth: Payer: Self-pay

## 2022-11-22 NOTE — Telephone Encounter (Signed)
Notified Patient of prior authorization approval for Oxycodone HCL 15 mg tablets. Medication is approved through 05/21/2023. No other needs or concerns voiced at this time.

## 2022-12-06 ENCOUNTER — Other Ambulatory Visit: Payer: Self-pay | Admitting: Nurse Practitioner

## 2022-12-06 DIAGNOSIS — G893 Neoplasm related pain (acute) (chronic): Secondary | ICD-10-CM

## 2022-12-06 DIAGNOSIS — M79605 Pain in left leg: Secondary | ICD-10-CM

## 2022-12-06 DIAGNOSIS — Z515 Encounter for palliative care: Secondary | ICD-10-CM

## 2022-12-06 DIAGNOSIS — C3490 Malignant neoplasm of unspecified part of unspecified bronchus or lung: Secondary | ICD-10-CM

## 2022-12-06 DIAGNOSIS — C7931 Secondary malignant neoplasm of brain: Secondary | ICD-10-CM

## 2022-12-07 ENCOUNTER — Encounter: Payer: Self-pay | Admitting: Internal Medicine

## 2022-12-11 DIAGNOSIS — M87051 Idiopathic aseptic necrosis of right femur: Secondary | ICD-10-CM | POA: Diagnosis not present

## 2022-12-17 ENCOUNTER — Telehealth: Payer: Self-pay | Admitting: *Deleted

## 2022-12-17 NOTE — Telephone Encounter (Signed)
Communicated response to the patient.No further questions.

## 2022-12-17 NOTE — Telephone Encounter (Signed)
Patient called to report that his cheeks and ears are red and his ankles are swollen twice the normal size.  He is taking Decadron 4 mg once daily.  He is asking if he could try half dose to see if the symptoms could reduce.    Routing to Dr Barbaraann Cao to advise.

## 2022-12-25 ENCOUNTER — Other Ambulatory Visit: Payer: Self-pay | Admitting: Internal Medicine

## 2022-12-25 DIAGNOSIS — M79604 Pain in right leg: Secondary | ICD-10-CM

## 2022-12-26 ENCOUNTER — Encounter: Payer: Self-pay | Admitting: Internal Medicine

## 2022-12-27 ENCOUNTER — Other Ambulatory Visit: Payer: Self-pay | Admitting: Nurse Practitioner

## 2022-12-27 DIAGNOSIS — C3490 Malignant neoplasm of unspecified part of unspecified bronchus or lung: Secondary | ICD-10-CM

## 2022-12-27 DIAGNOSIS — M79604 Pain in right leg: Secondary | ICD-10-CM

## 2022-12-27 DIAGNOSIS — C7931 Secondary malignant neoplasm of brain: Secondary | ICD-10-CM

## 2022-12-27 DIAGNOSIS — G893 Neoplasm related pain (acute) (chronic): Secondary | ICD-10-CM

## 2022-12-27 DIAGNOSIS — Z515 Encounter for palliative care: Secondary | ICD-10-CM
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
## 2022-12-31 ENCOUNTER — Inpatient Hospital Stay: Payer: Medicaid Other | Admitting: Nurse Practitioner

## 2022-12-31 ENCOUNTER — Ambulatory Visit: Payer: Medicaid Other | Admitting: Internal Medicine

## 2023-01-02 ENCOUNTER — Encounter: Payer: Self-pay | Admitting: Internal Medicine

## 2023-01-02 ENCOUNTER — Ambulatory Visit
Admission: RE | Admit: 2023-01-02 | Discharge: 2023-01-02 | Disposition: A | Discharge status: Home / Self Care | Payer: Medicaid Other | Source: Ambulatory Visit | Attending: Internal Medicine | Admitting: Internal Medicine

## 2023-01-02 DIAGNOSIS — C801 Malignant (primary) neoplasm, unspecified: Secondary | ICD-10-CM | POA: Diagnosis not present

## 2023-01-02 DIAGNOSIS — C7931 Secondary malignant neoplasm of brain: Secondary | ICD-10-CM

## 2023-01-02 MED ORDER — GADOPICLENOL 0.5 MMOL/ML IV SOLN
10.0000 mL | Freq: Once | INTRAVENOUS | Status: AC | PRN
  Administered 2023-01-02: 10 mL via INTRAVENOUS

## 2023-01-03 ENCOUNTER — Other Ambulatory Visit: Payer: Self-pay | Admitting: Radiation Therapy
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
## 2023-01-07 ENCOUNTER — Inpatient Hospital Stay: Payer: Medicaid Other

## 2023-01-07 ENCOUNTER — Encounter: Payer: Self-pay | Admitting: Nurse Practitioner

## 2023-01-07 ENCOUNTER — Inpatient Hospital Stay (HOSPITAL_BASED_OUTPATIENT_CLINIC_OR_DEPARTMENT_OTHER): Payer: Medicaid Other | Admitting: Nurse Practitioner

## 2023-01-07 ENCOUNTER — Inpatient Hospital Stay: Payer: Medicaid Other | Attending: Internal Medicine | Admitting: Internal Medicine

## 2023-01-07 VITALS — BP 110/67 | HR 78 | Temp 98.4°F | Resp 18 | Ht 70.0 in | Wt 172.9 lb

## 2023-01-07 DIAGNOSIS — M79604 Pain in right leg: Secondary | ICD-10-CM | POA: Diagnosis not present

## 2023-01-07 DIAGNOSIS — C3490 Malignant neoplasm of unspecified part of unspecified bronchus or lung: Secondary | ICD-10-CM

## 2023-01-07 DIAGNOSIS — R530 Neoplastic (malignant) related fatigue: Secondary | ICD-10-CM | POA: Diagnosis not present

## 2023-01-07 DIAGNOSIS — C7931 Secondary malignant neoplasm of brain: Secondary | ICD-10-CM

## 2023-01-07 DIAGNOSIS — Z515 Encounter for palliative care: Secondary | ICD-10-CM

## 2023-01-07 DIAGNOSIS — G893 Neoplasm related pain (acute) (chronic): Secondary | ICD-10-CM

## 2023-01-07 DIAGNOSIS — C7951 Secondary malignant neoplasm of bone: Secondary | ICD-10-CM | POA: Diagnosis present

## 2023-01-07 DIAGNOSIS — M79605 Pain in left leg: Secondary | ICD-10-CM | POA: Diagnosis not present

## 2023-01-07 DIAGNOSIS — Z79899 Other long term (current) drug therapy: Secondary | ICD-10-CM | POA: Diagnosis not present

## 2023-01-07 DIAGNOSIS — C801 Malignant (primary) neoplasm, unspecified: Secondary | ICD-10-CM | POA: Diagnosis present

## 2023-01-07 DIAGNOSIS — M792 Neuralgia and neuritis, unspecified: Secondary | ICD-10-CM | POA: Diagnosis not present

## 2023-01-07 MED ORDER — XTAMPZA ER 9 MG PO C12A: 1.0000 | EXTENDED_RELEASE_CAPSULE | Freq: Three times a day (TID) | ORAL | 0 refills | Status: DC
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
## 2023-01-11 DIAGNOSIS — M87051 Idiopathic aseptic necrosis of right femur: Secondary | ICD-10-CM | POA: Diagnosis not present

## 2023-01-17 ENCOUNTER — Other Ambulatory Visit: Payer: Self-pay

## 2023-01-17 DIAGNOSIS — C3490 Malignant neoplasm of unspecified part of unspecified bronchus or lung: Secondary | ICD-10-CM

## 2023-01-17 DIAGNOSIS — C7931 Secondary malignant neoplasm of brain: Secondary | ICD-10-CM

## 2023-01-17 DIAGNOSIS — M79604 Pain in right leg: Secondary | ICD-10-CM

## 2023-01-17 DIAGNOSIS — G893 Neoplasm related pain (acute) (chronic): Secondary | ICD-10-CM

## 2023-01-17 DIAGNOSIS — Z515 Encounter for palliative care: Secondary | ICD-10-CM

## 2023-01-17 DIAGNOSIS — M5416 Radiculopathy, lumbar region: Secondary | ICD-10-CM | POA: Diagnosis not present

## 2023-01-17 DIAGNOSIS — G629 Polyneuropathy, unspecified: Secondary | ICD-10-CM | POA: Diagnosis not present

## 2023-01-17 MED ORDER — OXYCODONE HCL 15 MG PO TABS: 15.0000 mg | ORAL_TABLET | ORAL | 0 refills | Status: DC | PRN

## 2023-01-17 NOTE — Telephone Encounter (Signed)
Prescription sent to patient's pharmacy per his request. I have reviewed the PDMP during this encounter.

## 2023-01-17 NOTE — Telephone Encounter (Signed)
Patient called for oxycodone refill, patient's normal pharmacy does not have medication in stock routed to secondary pharmacy.

## 2023-01-21 ENCOUNTER — Other Ambulatory Visit: Payer: Self-pay | Admitting: Nurse Practitioner

## 2023-01-21 DIAGNOSIS — Z515 Encounter for palliative care: Secondary | ICD-10-CM

## 2023-01-21 DIAGNOSIS — C3490 Malignant neoplasm of unspecified part of unspecified bronchus or lung: Secondary | ICD-10-CM

## 2023-01-21 DIAGNOSIS — C7951 Secondary malignant neoplasm of bone: Secondary | ICD-10-CM

## 2023-01-21 DIAGNOSIS — G893 Neoplasm related pain (acute) (chronic): Secondary | ICD-10-CM

## 2023-01-21 DIAGNOSIS — M79605 Pain in left leg: Secondary | ICD-10-CM

## 2023-01-24 ENCOUNTER — Other Ambulatory Visit: Payer: Self-pay | Admitting: Neurosurgery

## 2023-01-25 ENCOUNTER — Other Ambulatory Visit: Payer: Self-pay | Admitting: Radiation Therapy

## 2023-01-25 ENCOUNTER — Inpatient Hospital Stay: Payer: Medicaid Other | Attending: Internal Medicine

## 2023-01-26 ENCOUNTER — Ambulatory Visit (HOSPITAL_COMMUNITY)
Admission: RE | Admit: 2023-01-26 | Discharge: 2023-01-26 | Disposition: A | Discharge status: Home / Self Care | Payer: Medicaid Other | Source: Ambulatory Visit | Attending: Internal Medicine | Admitting: Internal Medicine

## 2023-01-26 DIAGNOSIS — C3490 Malignant neoplasm of unspecified part of unspecified bronchus or lung: Secondary | ICD-10-CM | POA: Insufficient documentation

## 2023-01-26 MED ORDER — IOHEXOL 300 MG/ML  SOLN
100.0000 mL | Freq: Once | INTRAMUSCULAR | Status: AC | PRN
Start: 2023-01-26 — End: 2023-01-26
  Administered 2023-01-26: 100 mL via INTRAVENOUS

## 2023-01-26 MED ORDER — HEPARIN SOD (PORK) LOCK FLUSH 100 UNIT/ML IV SOLN
INTRAVENOUS | Status: AC
  Filled 2023-01-26: qty 5

## 2023-01-26 MED ORDER — HEPARIN SOD (PORK) LOCK FLUSH 100 UNIT/ML IV SOLN
500.0000 [IU] | Freq: Once | INTRAVENOUS | Status: AC
  Administered 2023-01-26: 500 [IU] via INTRAVENOUS

## 2023-01-28 ENCOUNTER — Other Ambulatory Visit: Payer: Self-pay | Admitting: Internal Medicine

## 2023-01-28 DIAGNOSIS — C7931 Secondary malignant neoplasm of brain: Secondary | ICD-10-CM
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
## 2023-01-29 ENCOUNTER — Inpatient Hospital Stay: Payer: Medicaid Other | Admitting: Nurse Practitioner

## 2023-01-29 ENCOUNTER — Inpatient Hospital Stay: Payer: Medicaid Other | Admitting: Internal Medicine

## 2023-01-30 ENCOUNTER — Telehealth: Payer: Self-pay | Admitting: Internal Medicine

## 2023-01-30 ENCOUNTER — Other Ambulatory Visit: Payer: Self-pay | Admitting: Nurse Practitioner

## 2023-01-30 DIAGNOSIS — Z515 Encounter for palliative care: Secondary | ICD-10-CM

## 2023-01-30 DIAGNOSIS — C7931 Secondary malignant neoplasm of brain: Secondary | ICD-10-CM

## 2023-01-30 DIAGNOSIS — M79604 Pain in right leg: Secondary | ICD-10-CM

## 2023-01-30 DIAGNOSIS — C3490 Malignant neoplasm of unspecified part of unspecified bronchus or lung: Secondary | ICD-10-CM

## 2023-01-30 DIAGNOSIS — G893 Neoplasm related pain (acute) (chronic): Secondary | ICD-10-CM

## 2023-01-30 NOTE — Telephone Encounter (Signed)
Called and spoke with patient, advise reschedule for appointment with Dr. Arbutus Ped in 2/3 weeks. Appointment scheduled with Dr. Arbutus Ped on 12/23 @ 8:15 next availability.

## 2023-01-31 ENCOUNTER — Other Ambulatory Visit: Payer: Self-pay

## 2023-01-31 ENCOUNTER — Encounter: Payer: Self-pay | Admitting: Internal Medicine

## 2023-01-31 DIAGNOSIS — M79604 Pain in right leg: Secondary | ICD-10-CM

## 2023-01-31 DIAGNOSIS — C7931 Secondary malignant neoplasm of brain: Secondary | ICD-10-CM

## 2023-01-31 DIAGNOSIS — C3490 Malignant neoplasm of unspecified part of unspecified bronchus or lung: Secondary | ICD-10-CM

## 2023-01-31 DIAGNOSIS — G893 Neoplasm related pain (acute) (chronic): Secondary | ICD-10-CM

## 2023-01-31 DIAGNOSIS — M79605 Pain in left leg: Secondary | ICD-10-CM

## 2023-01-31 DIAGNOSIS — Z515 Encounter for palliative care: Secondary | ICD-10-CM

## 2023-01-31 MED ORDER — OXYCODONE HCL 15 MG PO TABS: 15.0000 mg | ORAL_TABLET | ORAL | 0 refills | Status: DC | PRN

## 2023-01-31 MED ORDER — XTAMPZA ER 9 MG PO C12A: 1.0000 | EXTENDED_RELEASE_CAPSULE | Freq: Three times a day (TID) | ORAL | 0 refills | Status: DC

## 2023-01-31 NOTE — Telephone Encounter (Signed)
Telephone call  

## 2023-01-31 NOTE — Progress Notes (Signed)
Surgical Instructions   Your procedure is scheduled on Wednesday February 06, 2023. Report to [facility] Main Entrance "A" at 5:30 A.M., then check in with the Admitting office. Any questions or running late day of surgery: call [phone]  Questions prior to your surgery date: call [phone], Monday-Friday, 8am-4pm. If you experience any cold or flu symptoms such as cough, fever, chills, shortness of breath, etc. between now and your scheduled surgery, please notify us at the above number.     Remember:  Do not eat or drink after midnight the night before your surgery   Take these medicines the morning of surgery with A SIP OF WATER  baclofen (LIORESAL)  dexamethasone (DECADRON)  DULoxetine (CYMBALTA)  gabapentin (NEURONTIN)  levETIRAcetam (KEPPRA)  oxyCODONE ER (XTAMPZA ER)  pantoprazole (PROTONIX)   May take these medicines IF NEEDED: albuterol (PROAIR HFA) 108 (90 Base) MCG/ACT inhaler.  Please bring inhaler with you to the hospital.   oxyCODONE (ROXICODONE)    One week prior to surgery, STOP taking any Aspirin (unless otherwise instructed by your surgeon) Aleve, Naproxen, Ibuprofen, Motrin, Advil, Goody's, BC's, all herbal medications, fish oil, and non-prescription vitamins.  This includes your diclofenac Sodium (VOLTAREN) 1 % GEL and your meloxicam (MOBIC).                         Do NOT Smoke (Tobacco/Vaping) for 24 hours prior to your procedure.  If you use a CPAP at night, you may bring your mask/headgear for your overnight stay.   You will be asked to remove any contacts, glasses, piercing's, hearing aid's, dentures/partials prior to surgery. Please bring cases for these items if needed.    Patients discharged the day of surgery will not be allowed to drive home, and someone needs to stay with them for 24 hours.  SURGICAL WAITING ROOM VISITATION Patients may have no more than 2 support people in the waiting area - these visitors may rotate.   Pre-op nurse will  coordinate an appropriate time for 1 ADULT support person, who may not rotate, to accompany patient in pre-op.  Children under the age of 23 must have an adult with them who is not the patient and must remain in the main waiting area with an adult.  If the patient needs to stay at the hospital during part of their recovery, the visitor guidelines for inpatient rooms apply.  Please refer to the [facility] website for the visitor guidelines for any additional information.   If you received a COVID test during your pre-op visit  it is requested that you wear a mask when out in public, stay away from anyone that may not be feeling well and notify your surgeon if you develop symptoms. If you have been in contact with anyone that has tested positive in the last 10 days please notify you surgeon.      Pre-operative CHG Bathing Instructions   You can play a key role in reducing the risk of infection after surgery. Your skin needs to be as free of germs as possible. You can reduce the number of germs on your skin by washing with CHG (chlorhexidine gluconate) soap before surgery. CHG is an antiseptic soap that kills germs and continues to kill germs even after washing.   DO NOT use if you have an allergy to chlorhexidine/CHG or antibacterial soaps. If your skin becomes reddened or irritated, stop using the CHG and notify one of our RNs at [phone].  TAKE A SHOWER THE NIGHT BEFORE SURGERY AND THE DAY OF SURGERY    Please keep in mind the following:  DO NOT shave, including legs and underarms, 48 hours prior to surgery.   You may shave your face before/day of surgery.  Place clean sheets on your bed the night before surgery Use a clean washcloth (not used since being washed) for each shower. DO NOT sleep with pet's night before surgery.  CHG Shower Instructions:  Wash your face and private area with normal soap. If you choose to wash your hair, wash first with your normal shampoo.   After you use shampoo/soap, rinse your hair and body thoroughly to remove shampoo/soap residue.  Turn the water OFF and apply half the bottle of CHG soap to a CLEAN washcloth.  Apply CHG soap ONLY FROM YOUR NECK DOWN TO YOUR TOES (washing for 3-5 minutes)  DO NOT use CHG soap on face, private areas, open wounds, or sores.  Pay special attention to the area where your surgery is being performed.  If you are having back surgery, having someone wash your back for you may be helpful. Wait 2 minutes after CHG soap is applied, then you may rinse off the CHG soap.  Pat dry with a clean towel  Put on clean pajamas    Additional instructions for the day of surgery: DO NOT APPLY any lotions, deodorants or cologne.    Do not wear jewelry Do not bring valuables to the hospital. [facility] is not responsible for valuables/personal belongings. Put on clean/comfortable clothes.  Please brush your teeth.  Ask your nurse before applying any prescription medications to the skin.

## 2023-01-31 NOTE — Telephone Encounter (Signed)
Pt called for medication refill, scheduled f/u

## 2023-02-01 ENCOUNTER — Other Ambulatory Visit: Payer: Self-pay

## 2023-02-01 ENCOUNTER — Encounter (HOSPITAL_COMMUNITY): Payer: Self-pay

## 2023-02-01 ENCOUNTER — Encounter (HOSPITAL_COMMUNITY)
Admission: RE | Admit: 2023-02-01 | Discharge: 2023-02-01 | Disposition: A | Discharge status: Home / Self Care | Payer: Medicaid Other | Source: Ambulatory Visit | Attending: Neurosurgery | Admitting: Neurosurgery

## 2023-02-01 VITALS — BP 158/70 | HR 83 | Temp 98.1°F | Resp 17 | Ht 70.0 in | Wt 187.7 lb

## 2023-02-01 DIAGNOSIS — Z01818 Encounter for other preprocedural examination: Secondary | ICD-10-CM

## 2023-02-01 DIAGNOSIS — Z01812 Encounter for preprocedural laboratory examination: Secondary | ICD-10-CM | POA: Insufficient documentation

## 2023-02-01 LAB — TYPE AND SCREEN
ABO/RH(D): O POS
Antibody Screen: NEGATIVE

## 2023-02-01 LAB — BASIC METABOLIC PANEL
Anion gap: 9 (ref 5–15)
BUN: 9 mg/dL (ref 6–20)
CO2: 26 mmol/L (ref 22–32)
Calcium: 8.7 mg/dL — ABNORMAL LOW (ref 8.9–10.3)
Chloride: 104 mmol/L (ref 98–111)
Creatinine, Ser: 0.82 mg/dL (ref 0.61–1.24)
GFR, Estimated: >60 mL/min (ref >60)
Glucose, Bld: 84 mg/dL (ref 70–99)
Potassium: 4.1 mmol/L (ref 3.5–5.1)
Sodium: 139 mmol/L (ref 135–145)

## 2023-02-01 LAB — CBC
HCT: 33.9 % — ABNORMAL LOW (ref 39.0–52.0)
Hemoglobin: 10.4 g/dL — ABNORMAL LOW (ref 13.0–17.0)
MCH: 24 pg — ABNORMAL LOW (ref 26.0–34.0)
MCHC: 30.7 g/dL (ref 30.0–36.0)
MCV: 78.1 fL — ABNORMAL LOW (ref 80.0–100.0)
Platelets: 305 10*3/uL (ref 150–400)
RBC: 4.34 MIL/uL (ref 4.22–5.81)
RDW: 19.3 % — ABNORMAL HIGH (ref 11.5–15.5)
WBC: 9.3 10*3/uL (ref 4.0–10.5)
nRBC: 0 % (ref 0.0–0.2)

## 2023-02-01 NOTE — Progress Notes (Signed)
PCP - Dr. Jonah Blue Cardiologist - denies Oncologist- Dr. Si Gaul  PPM/ICD - denies    Chest x-ray - 02/08/22 EKG - 02/07/22 Stress Test - 11/05/16 ECHO - 11/01/16 Cardiac Cath - denies  Sleep Study - denies   DM- denies  Last dose of GLP1 agonist-  n/a   ASA/Blood Thinner Instructions: n/a   ERAS Protcol - no, NPO   COVID TEST- n/a   Anesthesia review: no  Patient denies shortness of breath, fever, cough and chest pain at PAT appointment   All instructions explained to the patient, with a verbal understanding of the material. Patient agrees to go over the instructions while at home for a better understanding. The opportunity to ask questions was provided.

## 2023-02-05 ENCOUNTER — Other Ambulatory Visit: Payer: Self-pay | Admitting: Nurse Practitioner

## 2023-02-05 ENCOUNTER — Telehealth: Payer: Self-pay

## 2023-02-05 ENCOUNTER — Other Ambulatory Visit: Payer: Self-pay | Admitting: Internal Medicine

## 2023-02-05 NOTE — Anesthesia Preprocedure Evaluation (Signed)
Anesthesia Evaluation  Patient identified by MRN, date of birth, ID band Patient awake    Reviewed: Allergy & Precautions, NPO status , Patient's Chart, lab work & pertinent test results  History of Anesthesia Complications Negative for: history of anesthetic complications  Airway Mallampati: III  TM Distance: >3 FB Neck ROM: Full   Comment: Previous grade I view with Miller 3, easy mask Dental  (+) Dental Advisory Given, Edentulous Upper, Edentulous Lower   Pulmonary neg shortness of breath, neg sleep apnea, COPD, neg recent URI, Current Smoker H/o traumatic pneumothorax, lung cancer 2018   Pulmonary exam normal breath sounds clear to auscultation       Cardiovascular hypertension (amlodipine), Pt. on medications (-) angina (-) Past MI, (-) Cardiac Stents and (-) CABG (-) dysrhythmias  Rhythm:Regular Rate:Normal  Low-risk stress test 11/01/2016  Normal echo 11/01/2016   Neuro/Psych Seizures - (last seizure 2 months ago, took Keppra this morning),  PSYCHIATRIC DISORDERS Anxiety     Brain metastasis    GI/Hepatic ,GERD  Medicated,,(+)     substance abuse  alcohol use and marijuana use  Endo/Other  negative endocrine ROS    Renal/GU negative Renal ROS     Musculoskeletal   Abdominal   Peds  Hematology  (+) Blood dyscrasia, anemia Lab Results      Component                Value               Date                      WBC                      9.3                 02/01/2023                HGB                      10.4 (L)            02/01/2023                HCT                      33.9 (L)            02/01/2023                MCV                      78.1 (L)            02/01/2023                PLT                      305                 02/01/2023              Anesthesia Other Findings   Reproductive/Obstetrics                             Anesthesia Physical Anesthesia Plan  ASA:  3  Anesthesia Plan: General   Post-op Pain Management: Tylenol PO (pre-op)*   Induction: Intravenous  PONV Risk Score and Plan: 1 and Ondansetron, Dexamethasone and Treatment may vary due to age or medical condition  Airway Management Planned: Oral ETT  Additional Equipment: Arterial line  Intra-op Plan:   Post-operative Plan: Extubation in OR  Informed Consent: I have reviewed the patients History and Physical, chart, labs and discussed the procedure including the risks, benefits and alternatives for the proposed anesthesia with the patient or authorized representative who has indicated his/her understanding and acceptance.     Dental advisory given  Plan Discussed with: CRNA and Anesthesiologist  Anesthesia Plan Comments: (Risks of general anesthesia discussed including, but not limited to, sore throat, hoarse voice, chipped/damaged teeth, injury to vocal cords, nausea and vomiting, allergic reactions, lung infection, heart attack, stroke, and death. All questions answered. )        Anesthesia Quick Evaluation

## 2023-02-05 NOTE — Telephone Encounter (Signed)
Pt called asking for his pain medication to be refilled early, prior to his procedure on 02/06/23. Medications not due until 11/27 and 11/28 and it was explained to pt that he does not need to pick up these medications prior to his hospitalization d/t potential changes to his pain regimen post craniotomy. Pt was informed that while he was admitted for recovery the hospital would give him pain medications and a discharge order and once discharged the PMT team here at [facility] would then re-start and continue to prescribe pain medications. Pt verbalized understanding and had no further needs at this time. RN called pt pharmacy and placed a hold on oxycodone and xtampza until after pt procedure per Lowella Bandy, NP.

## 2023-02-06 ENCOUNTER — Inpatient Hospital Stay (HOSPITAL_COMMUNITY)
Admission: RE | Admit: 2023-02-06 | Discharge: 2023-02-09 | DRG: 025 | Disposition: A | Discharge status: Home / Self Care | Payer: Medicaid Other | Attending: Neurosurgery | Admitting: Neurosurgery

## 2023-02-06 ENCOUNTER — Other Ambulatory Visit: Payer: Self-pay

## 2023-02-06 ENCOUNTER — Inpatient Hospital Stay (HOSPITAL_COMMUNITY): Payer: Medicaid Other

## 2023-02-06 ENCOUNTER — Encounter (HOSPITAL_COMMUNITY)
Admission: RE | Disposition: A | Discharge status: Home / Self Care | Payer: Self-pay | Source: Home / Self Care | Attending: Neurosurgery

## 2023-02-06 ENCOUNTER — Encounter (HOSPITAL_COMMUNITY): Payer: Self-pay | Admitting: Neurosurgery

## 2023-02-06 DIAGNOSIS — G936 Cerebral edema: Secondary | ICD-10-CM | POA: Diagnosis present

## 2023-02-06 DIAGNOSIS — J9601 Acute respiratory failure with hypoxia: Secondary | ICD-10-CM | POA: Diagnosis not present

## 2023-02-06 DIAGNOSIS — C7931 Secondary malignant neoplasm of brain: Secondary | ICD-10-CM | POA: Diagnosis not present

## 2023-02-06 DIAGNOSIS — G8929 Other chronic pain: Secondary | ICD-10-CM | POA: Diagnosis present

## 2023-02-06 DIAGNOSIS — Z803 Family history of malignant neoplasm of breast: Secondary | ICD-10-CM | POA: Diagnosis not present

## 2023-02-06 DIAGNOSIS — Z791 Long term (current) use of non-steroidal anti-inflammatories (NSAID): Secondary | ICD-10-CM

## 2023-02-06 DIAGNOSIS — F17210 Nicotine dependence, cigarettes, uncomplicated: Secondary | ICD-10-CM | POA: Diagnosis present

## 2023-02-06 DIAGNOSIS — I10 Essential (primary) hypertension: Secondary | ICD-10-CM | POA: Diagnosis present

## 2023-02-06 DIAGNOSIS — Z9889 Other specified postprocedural states: Secondary | ICD-10-CM | POA: Diagnosis not present

## 2023-02-06 DIAGNOSIS — Z808 Family history of malignant neoplasm of other organs or systems: Secondary | ICD-10-CM | POA: Diagnosis not present

## 2023-02-06 DIAGNOSIS — Z8051 Family history of malignant neoplasm of kidney: Secondary | ICD-10-CM | POA: Diagnosis not present

## 2023-02-06 DIAGNOSIS — Z91040 Latex allergy status: Secondary | ICD-10-CM

## 2023-02-06 DIAGNOSIS — G9389 Other specified disorders of brain: Secondary | ICD-10-CM | POA: Diagnosis not present

## 2023-02-06 DIAGNOSIS — Z8249 Family history of ischemic heart disease and other diseases of the circulatory system: Secondary | ICD-10-CM

## 2023-02-06 DIAGNOSIS — Z79891 Long term (current) use of opiate analgesic: Secondary | ICD-10-CM

## 2023-02-06 DIAGNOSIS — J690 Pneumonitis due to inhalation of food and vomit: Secondary | ICD-10-CM | POA: Diagnosis not present

## 2023-02-06 DIAGNOSIS — G40909 Epilepsy, unspecified, not intractable, without status epilepticus: Secondary | ICD-10-CM | POA: Diagnosis present

## 2023-02-06 DIAGNOSIS — C7951 Secondary malignant neoplasm of bone: Secondary | ICD-10-CM | POA: Diagnosis present

## 2023-02-06 DIAGNOSIS — Z7952 Long term (current) use of systemic steroids: Secondary | ICD-10-CM

## 2023-02-06 DIAGNOSIS — Z79899 Other long term (current) drug therapy: Secondary | ICD-10-CM

## 2023-02-06 DIAGNOSIS — Z85118 Personal history of other malignant neoplasm of bronchus and lung: Secondary | ICD-10-CM | POA: Diagnosis not present

## 2023-02-06 DIAGNOSIS — I6789 Other cerebrovascular disease: Secondary | ICD-10-CM | POA: Diagnosis not present

## 2023-02-06 DIAGNOSIS — J449 Chronic obstructive pulmonary disease, unspecified: Secondary | ICD-10-CM | POA: Diagnosis present

## 2023-02-06 DIAGNOSIS — Z800 Family history of malignant neoplasm of digestive organs: Secondary | ICD-10-CM

## 2023-02-06 HISTORY — PX: CRANIOTOMY: SHX93

## 2023-02-06 HISTORY — PX: APPLICATION OF CRANIAL NAVIGATION: SHX6578

## 2023-02-06 LAB — CBC
HCT: 32 % — ABNORMAL LOW (ref 39.0–52.0)
Hemoglobin: 10.1 g/dL — ABNORMAL LOW (ref 13.0–17.0)
MCH: 24.4 pg — ABNORMAL LOW (ref 26.0–34.0)
MCHC: 31.6 g/dL (ref 30.0–36.0)
MCV: 77.3 fL — ABNORMAL LOW (ref 80.0–100.0)
Platelets: 260 10*3/uL (ref 150–400)
RBC: 4.14 MIL/uL — ABNORMAL LOW (ref 4.22–5.81)
RDW: 18.7 % — ABNORMAL HIGH (ref 11.5–15.5)
WBC: 10 10*3/uL (ref 4.0–10.5)
nRBC: 0 % (ref 0.0–0.2)

## 2023-02-06 LAB — BASIC METABOLIC PANEL
Anion gap: 10 (ref 5–15)
BUN: 9 mg/dL (ref 6–20)
CO2: 23 mmol/L (ref 22–32)
Calcium: 8.3 mg/dL — ABNORMAL LOW (ref 8.9–10.3)
Chloride: 104 mmol/L (ref 98–111)
Creatinine, Ser: 0.73 mg/dL (ref 0.61–1.24)
GFR, Estimated: >60 mL/min (ref >60)
Glucose, Bld: 146 mg/dL — ABNORMAL HIGH (ref 70–99)
Potassium: 3.5 mmol/L (ref 3.5–5.1)
Sodium: 137 mmol/L (ref 135–145)

## 2023-02-06 LAB — MRSA NEXT GEN BY PCR, NASAL: MRSA by PCR Next Gen: NOT DETECTED

## 2023-02-06 SURGERY — CRANIOTOMY TUMOR EXCISION
Anesthesia: General | Site: Head | Laterality: Right

## 2023-02-06 MED ORDER — ALBUTEROL SULFATE (2.5 MG/3ML) 0.083% IN NEBU: 2.5000 mg | INHALATION_SOLUTION | RESPIRATORY_TRACT | Status: DC | PRN

## 2023-02-06 MED ORDER — CEFAZOLIN SODIUM-DEXTROSE 2-4 GM/100ML-% IV SOLN
2.0000 g | INTRAVENOUS | Status: AC
  Administered 2023-02-06: 2 g via INTRAVENOUS
  Filled 2023-02-06: qty 100

## 2023-02-06 MED ORDER — VITAMIN D 25 MCG (1000 UNIT) PO TABS
2000.0000 [IU] | ORAL_TABLET | Freq: Every day | ORAL | Status: DC
  Administered 2023-02-06 – 2023-02-09 (×4): 2000 [IU] via ORAL
  Filled 2023-02-06 (×5): qty 2

## 2023-02-06 MED ORDER — DEXAMETHASONE SODIUM PHOSPHATE 4 MG/ML IJ SOLN
4.0000 mg | Freq: Four times a day (QID) | INTRAMUSCULAR | Status: AC
  Administered 2023-02-07 – 2023-02-08 (×4): 4 mg via INTRAVENOUS
  Filled 2023-02-06 (×4): qty 1

## 2023-02-06 MED ORDER — OXYCODONE HCL 5 MG PO TABS: 5.0000 mg | ORAL_TABLET | Freq: Once | ORAL | Status: DC | PRN

## 2023-02-06 MED ORDER — SUGAMMADEX SODIUM 200 MG/2ML IV SOLN
INTRAVENOUS | Status: DC | PRN
  Administered 2023-02-06: 200 mg via INTRAVENOUS

## 2023-02-06 MED ORDER — POTASSIUM CHLORIDE IN NACL 20-0.9 MEQ/L-% IV SOLN
INTRAVENOUS | Status: AC
Start: 2023-02-06 — End: 2023-02-07
  Filled 2023-02-06 (×2): qty 1000

## 2023-02-06 MED ORDER — IPRATROPIUM-ALBUTEROL 0.5-2.5 (3) MG/3ML IN SOLN
RESPIRATORY_TRACT | Status: AC
  Administered 2023-02-06: 3 mL
  Filled 2023-02-06: qty 3

## 2023-02-06 MED ORDER — PHENYLEPHRINE 80 MCG/ML (10ML) SYRINGE FOR IV PUSH (FOR BLOOD PRESSURE SUPPORT)
PREFILLED_SYRINGE | INTRAVENOUS | Status: DC | PRN
  Administered 2023-02-06: 160 ug via INTRAVENOUS

## 2023-02-06 MED ORDER — FENTANYL CITRATE (PF) 250 MCG/5ML IJ SOLN
INTRAMUSCULAR | Status: DC | PRN
  Administered 2023-02-06: 50 ug via INTRAVENOUS
  Administered 2023-02-06 (×2): 100 ug via INTRAVENOUS

## 2023-02-06 MED ORDER — LABETALOL HCL 5 MG/ML IV SOLN
10.0000 mg | INTRAVENOUS | Status: DC | PRN
  Administered 2023-02-06 – 2023-02-07 (×8): 20 mg via INTRAVENOUS
  Administered 2023-02-07 – 2023-02-08 (×5): 40 mg via INTRAVENOUS
  Administered 2023-02-08: 10 mg via INTRAVENOUS
  Administered 2023-02-08: 40 mg via INTRAVENOUS
  Administered 2023-02-09: 20 mg via INTRAVENOUS
  Filled 2023-02-06: qty 8
  Filled 2023-02-06: qty 4
  Filled 2023-02-06 (×2): qty 8
  Filled 2023-02-06 (×2): qty 4
  Filled 2023-02-06: qty 8
  Filled 2023-02-06 (×3): qty 4
  Filled 2023-02-06 (×2): qty 8
  Filled 2023-02-06 (×2): qty 4
  Filled 2023-02-06 (×2): qty 8
  Filled 2023-02-06: qty 4

## 2023-02-06 MED ORDER — POLYETHYLENE GLYCOL 3350 17 G PO PACK: 17.0000 g | PACK | Freq: Every day | ORAL | Status: DC | PRN

## 2023-02-06 MED ORDER — PROPOFOL 10 MG/ML IV BOLUS
INTRAVENOUS | Status: AC
  Filled 2023-02-06: qty 20

## 2023-02-06 MED ORDER — LIDOCAINE 5 % EX PTCH
1.0000 | MEDICATED_PATCH | Freq: Every day | CUTANEOUS | Status: DC | PRN
Start: 2023-02-06 — End: 2023-02-09

## 2023-02-06 MED ORDER — DEXAMETHASONE SODIUM PHOSPHATE 10 MG/ML IJ SOLN
6.0000 mg | Freq: Four times a day (QID) | INTRAMUSCULAR | Status: AC
  Administered 2023-02-06 – 2023-02-07 (×4): 6 mg via INTRAVENOUS
  Filled 2023-02-06 (×4): qty 1

## 2023-02-06 MED ORDER — DEXAMETHASONE SODIUM PHOSPHATE 10 MG/ML IJ SOLN
INTRAMUSCULAR | Status: AC
  Filled 2023-02-06: qty 1

## 2023-02-06 MED ORDER — THROMBIN 20000 UNITS EX SOLR
CUTANEOUS | Status: AC
  Filled 2023-02-06: qty 20000

## 2023-02-06 MED ORDER — DEXMEDETOMIDINE HCL IN NACL 80 MCG/20ML IV SOLN
INTRAVENOUS | Status: DC | PRN
Start: 2023-02-06 — End: 2023-02-06
  Administered 2023-02-06: 12 ug via INTRAVENOUS

## 2023-02-06 MED ORDER — MIDAZOLAM HCL 2 MG/2ML IJ SOLN
INTRAMUSCULAR | Status: DC | PRN
  Administered 2023-02-06: 2 mg via INTRAVENOUS

## 2023-02-06 MED ORDER — PROMETHAZINE HCL 12.5 MG PO TABS
12.5000 mg | ORAL_TABLET | ORAL | Status: DC | PRN
  Administered 2023-02-07: 25 mg via ORAL
  Administered 2023-02-07 – 2023-02-08 (×3): 12.5 mg via ORAL
  Filled 2023-02-06: qty 1
  Filled 2023-02-06 (×4): qty 2

## 2023-02-06 MED ORDER — NICOTINE 21 MG/24HR TD PT24
21.0000 mg | MEDICATED_PATCH | Freq: Every day | TRANSDERMAL | Status: DC
  Administered 2023-02-06 – 2023-02-09 (×4): 21 mg via TRANSDERMAL
  Filled 2023-02-06 (×4): qty 1

## 2023-02-06 MED ORDER — LIDOCAINE-EPINEPHRINE 1 %-1:100000 IJ SOLN
INTRAMUSCULAR | Status: DC | PRN
  Administered 2023-02-06: 10 mL

## 2023-02-06 MED ORDER — DIPHENHYDRAMINE HCL 50 MG/ML IJ SOLN
INTRAMUSCULAR | Status: AC
  Filled 2023-02-06: qty 1

## 2023-02-06 MED ORDER — PROPOFOL 10 MG/ML IV BOLUS
INTRAVENOUS | Status: DC | PRN
  Administered 2023-02-06: 100 mg via INTRAVENOUS
  Administered 2023-02-06: 170 mg via INTRAVENOUS
  Administered 2023-02-06: 50 mg via INTRAVENOUS

## 2023-02-06 MED ORDER — BACITRACIN ZINC 500 UNIT/GM EX OINT
TOPICAL_OINTMENT | CUTANEOUS | Status: AC
  Filled 2023-02-06: qty 28.35

## 2023-02-06 MED ORDER — LIDOCAINE 2% (20 MG/ML) 5 ML SYRINGE
INTRAMUSCULAR | Status: DC | PRN
  Administered 2023-02-06: 100 mg via INTRAVENOUS

## 2023-02-06 MED ORDER — OXYCODONE HCL ER 10 MG PO T12A: 10.0000 mg | EXTENDED_RELEASE_TABLET | Freq: Two times a day (BID) | ORAL | Status: DC

## 2023-02-06 MED ORDER — ONDANSETRON HCL 4 MG/2ML IJ SOLN
4.0000 mg | INTRAMUSCULAR | Status: DC | PRN
  Administered 2023-02-07: 4 mg via INTRAVENOUS
  Filled 2023-02-06 (×2): qty 2

## 2023-02-06 MED ORDER — ORAL CARE MOUTH RINSE: 15.0000 mL | OROMUCOSAL | Status: DC | PRN

## 2023-02-06 MED ORDER — ROCURONIUM BROMIDE 10 MG/ML (PF) SYRINGE
PREFILLED_SYRINGE | INTRAVENOUS | Status: AC
  Filled 2023-02-06: qty 10

## 2023-02-06 MED ORDER — LACTATED RINGERS IV SOLN: INTRAVENOUS | Status: DC

## 2023-02-06 MED ORDER — DOCUSATE SODIUM 100 MG PO CAPS
100.0000 mg | ORAL_CAPSULE | Freq: Two times a day (BID) | ORAL | Status: DC
  Administered 2023-02-06 – 2023-02-09 (×6): 100 mg via ORAL
  Filled 2023-02-06 (×6): qty 1

## 2023-02-06 MED ORDER — ONDANSETRON HCL 4 MG PO TABS: 4.0000 mg | ORAL_TABLET | ORAL | Status: DC | PRN

## 2023-02-06 MED ORDER — CEFAZOLIN SODIUM-DEXTROSE 2-4 GM/100ML-% IV SOLN
2.0000 g | Freq: Three times a day (TID) | INTRAVENOUS | Status: AC
  Administered 2023-02-06 – 2023-02-07 (×2): 2 g via INTRAVENOUS
  Filled 2023-02-06 (×2): qty 100

## 2023-02-06 MED ORDER — DEXMEDETOMIDINE HCL IN NACL 80 MCG/20ML IV SOLN
INTRAVENOUS | Status: AC
  Filled 2023-02-06: qty 20

## 2023-02-06 MED ORDER — DEXAMETHASONE SODIUM PHOSPHATE 10 MG/ML IJ SOLN
INTRAMUSCULAR | Status: DC | PRN
Start: 2023-02-06 — End: 2023-02-06
  Administered 2023-02-06: 10 mg via INTRAVENOUS

## 2023-02-06 MED ORDER — CHLORHEXIDINE GLUCONATE CLOTH 2 % EX PADS: 6.0000 | MEDICATED_PAD | Freq: Once | CUTANEOUS | Status: DC

## 2023-02-06 MED ORDER — ONDANSETRON HCL 4 MG/2ML IJ SOLN
INTRAMUSCULAR | Status: AC
  Filled 2023-02-06: qty 2

## 2023-02-06 MED ORDER — ADULT MULTIVITAMIN W/MINERALS CH
1.0000 | ORAL_TABLET | Freq: Every day | ORAL | Status: DC
  Administered 2023-02-06 – 2023-02-09 (×4): 1 via ORAL
  Filled 2023-02-06 (×4): qty 1

## 2023-02-06 MED ORDER — AMLODIPINE BESYLATE 5 MG PO TABS: 5.0000 mg | ORAL_TABLET | Freq: Every day | ORAL | Status: DC

## 2023-02-06 MED ORDER — FOLIC ACID 1 MG PO TABS
1.0000 mg | ORAL_TABLET | Freq: Every day | ORAL | Status: DC
  Administered 2023-02-06 – 2023-02-09 (×4): 1 mg via ORAL
  Filled 2023-02-06 (×4): qty 1

## 2023-02-06 MED ORDER — ALBUTEROL SULFATE HFA 108 (90 BASE) MCG/ACT IN AERS: 2.0000 | INHALATION_SPRAY | RESPIRATORY_TRACT | Status: DC | PRN

## 2023-02-06 MED ORDER — THROMBIN 20000 UNITS EX SOLR: CUTANEOUS | Status: DC | PRN

## 2023-02-06 MED ORDER — ACETAMINOPHEN 500 MG PO TABS
1000.0000 mg | ORAL_TABLET | Freq: Once | ORAL | Status: AC
  Administered 2023-02-06: 1000 mg via ORAL
  Filled 2023-02-06: qty 2

## 2023-02-06 MED ORDER — OXYCODONE HCL 5 MG PO TABS
15.0000 mg | ORAL_TABLET | ORAL | Status: DC | PRN
  Administered 2023-02-06 – 2023-02-09 (×7): 15 mg via ORAL
  Filled 2023-02-06 (×7): qty 3

## 2023-02-06 MED ORDER — DULOXETINE HCL 30 MG PO CPEP
30.0000 mg | ORAL_CAPSULE | Freq: Every day | ORAL | Status: DC
  Administered 2023-02-07 – 2023-02-09 (×3): 30 mg via ORAL
  Filled 2023-02-06 (×3): qty 1

## 2023-02-06 MED ORDER — DEXAMETHASONE SODIUM PHOSPHATE 4 MG/ML IJ SOLN
4.0000 mg | Freq: Three times a day (TID) | INTRAMUSCULAR | Status: DC
  Administered 2023-02-08 – 2023-02-09 (×3): 4 mg via INTRAVENOUS
  Filled 2023-02-06 (×4): qty 1

## 2023-02-06 MED ORDER — CHLORHEXIDINE GLUCONATE CLOTH 2 % EX PADS
6.0000 | MEDICATED_PAD | Freq: Every day | CUTANEOUS | Status: DC
  Administered 2023-02-06 – 2023-02-08 (×3): 6 via TOPICAL

## 2023-02-06 MED ORDER — DICLOFENAC SODIUM 1 % EX GEL
2.0000 g | Freq: Four times a day (QID) | CUTANEOUS | Status: DC | PRN
Start: 2023-02-06 — End: 2023-02-09

## 2023-02-06 MED ORDER — LIDOCAINE 2% (20 MG/ML) 5 ML SYRINGE
INTRAMUSCULAR | Status: AC
  Filled 2023-02-06: qty 5

## 2023-02-06 MED ORDER — PANTOPRAZOLE SODIUM 40 MG PO TBEC
40.0000 mg | DELAYED_RELEASE_TABLET | Freq: Two times a day (BID) | ORAL | Status: DC
  Administered 2023-02-06 – 2023-02-09 (×6): 40 mg via ORAL
  Filled 2023-02-06 (×6): qty 1

## 2023-02-06 MED ORDER — CHLORHEXIDINE GLUCONATE 0.12 % MT SOLN
15.0000 mL | Freq: Once | OROMUCOSAL | Status: AC
  Administered 2023-02-06: 15 mL via OROMUCOSAL
  Filled 2023-02-06: qty 15

## 2023-02-06 MED ORDER — ORAL CARE MOUTH RINSE: 15.0000 mL | Freq: Once | OROMUCOSAL | Status: AC

## 2023-02-06 MED ORDER — AMISULPRIDE (ANTIEMETIC) 5 MG/2ML IV SOLN
10.0000 mg | Freq: Once | INTRAVENOUS | Status: DC | PRN
Start: 2023-02-06 — End: 2023-02-06

## 2023-02-06 MED ORDER — PHENYLEPHRINE HCL-NACL 20-0.9 MG/250ML-% IV SOLN
INTRAVENOUS | Status: DC | PRN
  Administered 2023-02-06: 50 ug/min via INTRAVENOUS

## 2023-02-06 MED ORDER — FENTANYL CITRATE (PF) 100 MCG/2ML IJ SOLN: 25.0000 ug | INTRAMUSCULAR | Status: DC | PRN

## 2023-02-06 MED ORDER — B COMPLEX-C PO TABS
1.0000 | ORAL_TABLET | Freq: Every day | ORAL | Status: DC
  Administered 2023-02-06 – 2023-02-09 (×4): 1 via ORAL
  Filled 2023-02-06 (×4): qty 1

## 2023-02-06 MED ORDER — PANTOPRAZOLE SODIUM 40 MG IV SOLR: 40.0000 mg | Freq: Every day | INTRAVENOUS | Status: DC

## 2023-02-06 MED ORDER — FENTANYL CITRATE (PF) 250 MCG/5ML IJ SOLN
INTRAMUSCULAR | Status: AC
  Filled 2023-02-06: qty 5

## 2023-02-06 MED ORDER — HEMOSTATIC AGENTS (NO CHARGE) OPTIME
TOPICAL | Status: DC | PRN
  Administered 2023-02-06: 1 via TOPICAL

## 2023-02-06 MED ORDER — OXYCODONE HCL 5 MG/5ML PO SOLN: 5.0000 mg | Freq: Once | ORAL | Status: DC | PRN

## 2023-02-06 MED ORDER — OXYCODONE HCL ER 10 MG PO T12A
10.0000 mg | EXTENDED_RELEASE_TABLET | Freq: Two times a day (BID) | ORAL | Status: DC
  Administered 2023-02-06 – 2023-02-09 (×6): 10 mg via ORAL
  Filled 2023-02-06 (×6): qty 1

## 2023-02-06 MED ORDER — ONDANSETRON HCL 4 MG/2ML IJ SOLN
INTRAMUSCULAR | Status: DC | PRN
  Administered 2023-02-06: 4 mg via INTRAVENOUS

## 2023-02-06 MED ORDER — LEVETIRACETAM 750 MG PO TABS
2000.0000 mg | ORAL_TABLET | Freq: Two times a day (BID) | ORAL | Status: DC
  Administered 2023-02-06 – 2023-02-09 (×6): 2000 mg via ORAL
  Filled 2023-02-06 (×6): qty 1

## 2023-02-06 MED ORDER — THROMBIN 5000 UNITS EX SOLR
CUTANEOUS | Status: AC
  Filled 2023-02-06: qty 5000

## 2023-02-06 MED ORDER — AMITRIPTYLINE HCL 25 MG PO TABS
100.0000 mg | ORAL_TABLET | Freq: Every day | ORAL | Status: DC
  Administered 2023-02-06 – 2023-02-08 (×3): 100 mg via ORAL
  Filled 2023-02-06 (×2): qty 2
  Filled 2023-02-06: qty 4
  Filled 2023-02-06: qty 2

## 2023-02-06 MED ORDER — 0.9 % SODIUM CHLORIDE (POUR BTL) OPTIME
TOPICAL | Status: DC | PRN
  Administered 2023-02-06 (×3): 1000 mL

## 2023-02-06 MED ORDER — ROCURONIUM BROMIDE 10 MG/ML (PF) SYRINGE
PREFILLED_SYRINGE | INTRAVENOUS | Status: DC | PRN
  Administered 2023-02-06: 70 mg via INTRAVENOUS
  Administered 2023-02-06: 20 mg via INTRAVENOUS
  Administered 2023-02-06: 30 mg via INTRAVENOUS

## 2023-02-06 MED ORDER — MIDAZOLAM HCL 2 MG/2ML IJ SOLN
INTRAMUSCULAR | Status: AC
Start: 2023-02-06 — End: ?
  Filled 2023-02-06: qty 2

## 2023-02-06 MED ORDER — BACLOFEN 10 MG PO TABS
10.0000 mg | ORAL_TABLET | Freq: Three times a day (TID) | ORAL | Status: DC
  Administered 2023-02-06 – 2023-02-09 (×9): 10 mg via ORAL
  Filled 2023-02-06 (×9): qty 1

## 2023-02-06 MED ORDER — OXYCODONE HCL 5 MG PO TABS: 15.0000 mg | ORAL_TABLET | ORAL | Status: DC | PRN

## 2023-02-06 MED ORDER — THROMBIN 5000 UNITS EX SOLR: OROMUCOSAL | Status: DC | PRN

## 2023-02-06 MED ORDER — GABAPENTIN 400 MG PO CAPS
800.0000 mg | ORAL_CAPSULE | Freq: Three times a day (TID) | ORAL | Status: DC
  Administered 2023-02-06 – 2023-02-09 (×9): 800 mg via ORAL
  Filled 2023-02-06 (×9): qty 2

## 2023-02-06 MED ORDER — LEVETIRACETAM IN NACL 500 MG/100ML IV SOLN: 500.0000 mg | Freq: Two times a day (BID) | INTRAVENOUS | Status: DC

## 2023-02-06 MED ORDER — LABETALOL HCL 5 MG/ML IV SOLN
INTRAVENOUS | Status: DC | PRN
Start: 2023-02-06 — End: 2023-02-06
  Administered 2023-02-06: 10 mg via INTRAVENOUS

## 2023-02-06 MED ORDER — LORAZEPAM 1 MG PO TABS: 1.0000 mg | ORAL_TABLET | Freq: Every day | ORAL | Status: DC

## 2023-02-06 MED ORDER — DIPHENHYDRAMINE HCL 50 MG/ML IJ SOLN
INTRAMUSCULAR | Status: DC | PRN
  Administered 2023-02-06: 12.5 mg via INTRAVENOUS

## 2023-02-06 MED ORDER — LIDOCAINE-EPINEPHRINE 1 %-1:100000 IJ SOLN
INTRAMUSCULAR | Status: AC
  Filled 2023-02-06: qty 1

## 2023-02-06 MED ORDER — BACITRACIN ZINC 500 UNIT/GM EX OINT
TOPICAL_OINTMENT | CUTANEOUS | Status: DC | PRN
  Administered 2023-02-06 (×2): 1 via TOPICAL

## 2023-02-06 SURGICAL SUPPLY — 60 items
BAG COUNTER SPONGE SURGICOUNT (BAG) ×2 IMPLANT
BLADE CLIPPER SURG (BLADE) ×2 IMPLANT
BLADE SURG 11 STRL SS (BLADE) ×2 IMPLANT
BNDG COHESIVE 4X5 TAN STRL (GAUZE/BANDAGES/DRESSINGS) ×2 IMPLANT
BNDG STRETCH 4X75 STRL LF (GAUZE/BANDAGES/DRESSINGS) ×2 IMPLANT
BUR ACORN 9.0 PRECISION (BURR) ×2 IMPLANT
BUR SPIRAL ROUTER 2.3 (BUR) IMPLANT
CANISTER SUCT 3000ML PPV (MISCELLANEOUS) ×4 IMPLANT
CLIP TI MEDIUM 6 (CLIP) ×2 IMPLANT
CNTNR URN SCR LID CUP LEK RST (MISCELLANEOUS) ×2 IMPLANT
COVERAGE SUPPORT O-ARM STEALTH (MISCELLANEOUS) ×2 IMPLANT
DERMABOND ADVANCED .7 DNX12 (GAUZE/BANDAGES/DRESSINGS) ×2 IMPLANT
DRAPE MICROSCOPE SLANT 54X150 (MISCELLANEOUS) IMPLANT
DRAPE NEUROLOGICAL W/INCISE (DRAPES) ×2 IMPLANT
DRAPE STERI IOBAN 125X83 (DRAPES) IMPLANT
DRAPE WARM FLUID 44X44 (DRAPES) ×2 IMPLANT
ELECT REM PT RETURN 9FT ADLT (ELECTROSURGICAL) ×2
ELECTRODE REM PT RTRN 9FT ADLT (ELECTROSURGICAL) ×2 IMPLANT
FEE COVERAGE SUPPORT O-ARM (MISCELLANEOUS) ×2 IMPLANT
FORCEPS BIPOLAR SPETZLER 8 1.0 (NEUROSURGERY SUPPLIES) IMPLANT
GAUZE 4X4 16PLY ~~LOC~~+RFID DBL (SPONGE) IMPLANT
GAUZE SPONGE 4X4 12PLY STRL (GAUZE/BANDAGES/DRESSINGS) ×2 IMPLANT
GLOVE BIO SURGEON STRL SZ 6.5 (GLOVE) ×2 IMPLANT
GLOVE BIO SURGEON STRL SZ8 (GLOVE) ×2 IMPLANT
GLOVE BIOGEL PI IND STRL 6.5 (GLOVE) IMPLANT
GLOVE INDICATOR 8.5 STRL (GLOVE) ×4 IMPLANT
GOWN STRL REUS W/ TWL LRG LVL3 (GOWN DISPOSABLE) ×2 IMPLANT
GOWN STRL REUS W/ TWL XL LVL3 (GOWN DISPOSABLE) ×2 IMPLANT
GOWN STRL REUS W/TWL 2XL LVL3 (GOWN DISPOSABLE) ×2 IMPLANT
GRAFT DURAGEN MATRIX 2WX2L IMPLANT
HEMOSTAT POWDER KIT SURGIFOAM (HEMOSTASIS) IMPLANT
HEMOSTAT SURGICEL 2X14 (HEMOSTASIS) IMPLANT
KIT BASIN OR (CUSTOM PROCEDURE TRAY) ×2 IMPLANT
KIT TURNOVER KIT B (KITS) ×2 IMPLANT
MARKER SPHERE PSV REFLC NDI (MISCELLANEOUS) ×6 IMPLANT
NDL HYPO 25X1 1.5 SAFETY (NEEDLE) ×2 IMPLANT
NEEDLE HYPO 25X1 1.5 SAFETY (NEEDLE) ×2 IMPLANT
NS IRRIG 1000ML POUR BTL (IV SOLUTION) ×4 IMPLANT
PACK CRANIOTOMY CUSTOM (CUSTOM PROCEDURE TRAY) ×2 IMPLANT
PAD ARMBOARD 7.5X6 YLW CONV (MISCELLANEOUS) ×6 IMPLANT
PATTIES SURGICAL .25X.25 (GAUZE/BANDAGES/DRESSINGS) IMPLANT
PATTIES SURGICAL .5 X.5 (GAUZE/BANDAGES/DRESSINGS) IMPLANT
PATTIES SURGICAL .5 X3 (DISPOSABLE) IMPLANT
PATTIES SURGICAL 1X1 (DISPOSABLE) IMPLANT
SCREW UNIII AXS SD 1.5X4 (Screw) IMPLANT
SPIKE FLUID TRANSFER (MISCELLANEOUS) ×2 IMPLANT
SPONGE NEURO XRAY DETECT 1X3 (DISPOSABLE) IMPLANT
SPONGE SURGIFOAM ABS GEL 100 (HEMOSTASIS) IMPLANT
STAPLER VISISTAT 35W (STAPLE) ×2 IMPLANT
SUT ETHILON 3 0 FSL (SUTURE) IMPLANT
SUT NURALON 4 0 TR CR/8 (SUTURE) ×6 IMPLANT
SUT VIC AB 2-0 CT1 18 (SUTURE) ×2 IMPLANT
TAPE CLOTH SURG 4X10 WHT LF (GAUZE/BANDAGES/DRESSINGS) IMPLANT
TOWEL GREEN STERILE (TOWEL DISPOSABLE) ×2 IMPLANT
TOWEL GREEN STERILE FF (TOWEL DISPOSABLE) IMPLANT
TRAY FOLEY MTR SLVR 16FR STAT (SET/KITS/TRAYS/PACK) ×2 IMPLANT
TUBE CONNECTING 12X1/4 (SUCTIONS) ×2 IMPLANT
TUBING FEATHERFLOW (TUBING) IMPLANT
UNDERPAD 30X36 HEAVY ABSORB (UNDERPADS AND DIAPERS) ×2 IMPLANT
WATER STERILE IRR 1000ML POUR (IV SOLUTION) ×2 IMPLANT

## 2023-02-06 NOTE — Anesthesia Procedure Notes (Signed)
Procedure Name: Intubation Date/Time: 02/06/2023 7:53 AM  Performed by: Sandie Ano, CRNAPre-anesthesia Checklist: Patient identified, Emergency Drugs available, Suction available and Patient being monitored Patient Re-evaluated:Patient Re-evaluated prior to induction Oxygen Delivery Method: Circle system utilized Preoxygenation: Pre-oxygenation with 100% oxygen Induction Type: IV induction Ventilation: Mask ventilation without difficulty Laryngoscope Size: Mac and 4 Grade View: Grade II Tube type: Oral Tube size: 7.5 mm Number of attempts: 1 Airway Equipment and Method: Stylet, Oral airway and Bite block Placement Confirmation: ETT inserted through vocal cords under direct vision, positive ETCO2 and breath sounds checked- equal and bilateral Secured at: 24 cm Tube secured with: Tape Dental Injury: Teeth and Oropharynx as per pre-operative assessment

## 2023-02-06 NOTE — TOC CM/SW Note (Signed)
Transition of Care [facility]) - Inpatient Brief Assessment   Patient Details  Name: Eric Mason MRN: [id number] Date of Birth: Mar 02, 1971  Transition of [facility]) CM/SW Contact:    Mearl Latin, LCSW Phone Number: 02/06/2023, 3:02 PM   Clinical Narrative: Transition of Care [facility]) - Inpatient Brief Assessment   Patient Details  Name: Eric Mason MRN: [id number] Date of Birth: 09-14-1970  Transition of Care [facility]) CM/SW Contact:    Mearl Latin, LCSW Phone Number: 02/06/2023, 3:02 PM   Clinical Narrative: Patient admitted from home S/P craniotomy. No current TOC needs identified at this time but please place consult if needed.    Transition of Care Asessment: Insurance and Status: Insurance coverage has been reviewed Patient has primary care physician: Yes Home environment has been reviewed: From home Prior level of function:: Independent Prior/Current Home Services: No current home services Social Determinants of Health Reivew: SDOH reviewed no interventions necessary Readmission risk has been reviewed: Yes Transition of care needs: no transition of care needs at this time    Transition of Care Asessment: Insurance and Status: Insurance coverage has been reviewed Patient has primary care physician: Yes Home environment has been reviewed: From home Prior level of function:: Independent Prior/Current Home Services: No current home services Social Determinants of Health Reivew: SDOH reviewed no interventions necessary Readmission risk has been reviewed: Yes Transition of care needs: no transition of care needs at this time

## 2023-02-06 NOTE — Transfer of Care (Signed)
Immediate Anesthesia Transfer of Care Note  Patient: Eric Mason  Procedure(s) Performed: Right temporoparietal craniotomy for resection of recurrent brain metastasis (Right: Head) APPLICATION OF CRANIAL NAVIGATION (Right)  Patient Location: PACU  Anesthesia Type:General  Level of Consciousness: drowsy and patient cooperative  Airway & Oxygen Therapy: Patient Spontanous Breathing and Patient connected to face mask  Post-op Assessment: Report given to RN, Post -op Vital signs reviewed and stable, Patient moving all extremities, and Patient moving all extremities X 4  Post vital signs: Reviewed and stable  Last Vitals:  Vitals Value Taken Time  BP    Temp    Pulse 80 02/06/23 1055  Resp 14 02/06/23 1052  SpO2 99 % 02/06/23 1055  Vitals shown include unfiled device data.  Last Pain:  Vitals:   02/06/23 0650  TempSrc:   PainSc: 0-No pain         Complications: There were no known notable events for this encounter.

## 2023-02-06 NOTE — Anesthesia Postprocedure Evaluation (Signed)
Anesthesia Post Note  Patient: Eric Mason  Procedure(s) Performed: Right temporoparietal craniotomy for resection of recurrent brain metastasis (Right: Head) APPLICATION OF CRANIAL NAVIGATION (Right)     Patient location during evaluation: PACU Anesthesia Type: General Level of consciousness: awake and alert Pain management: pain level controlled Vital Signs Assessment: post-procedure vital signs reviewed and stable Respiratory status: spontaneous breathing, nonlabored ventilation and respiratory function stable Cardiovascular status: blood pressure returned to baseline and stable Postop Assessment: no apparent nausea or vomiting Anesthetic complications: no   There were no known notable events for this encounter.  Last Vitals:  Vitals:   02/06/23 1115 02/06/23 1130  BP: 133/77 135/82  Pulse: 70 68  Resp: 11 11  Temp:  37.1 C  SpO2: 97% 97%    Last Pain:  Vitals:   02/06/23 1130  TempSrc:   PainSc: 0-No pain                 Lowella Curb

## 2023-02-06 NOTE — Op Note (Signed)
Preoperative diagnosis: Recurrent right temporal met.  Postoperative diagnosis: Same.  Procedure: Redo stereotactic craniotomy for reresection of recurrent right temporal lobe metastasis utilizing  the Stealth stereotactic navigation system.  Surgeon: Donalee Citrin.  Assistant: Julien Girt.  Anesthesia: General.  EBL: Minimal.  HPI: 52 year old gentleman with history of metastatic carcinoma has been followed had right temporal craniotomy for resection of mass back about 4 5 years ago and subsequent stereotactic radiosurgery and on serial imaging showed enlargement with increased edema around the met and patient requested resection and tumor board met extensively discussed the patient and recommended redo craniotomy we extensively went over the risks and benefits of the operation with him as well as perioperative course expectations of outcome and alternatives to surgery and he understood and agreed to proceed forward.  Operative procedure: Patient was brought into the OR was induced under general anesthesia positioned prone shoulder bump under his right shoulder head turned to the right exposing the posterior temporoparietal area.  His head was shaved prepped and draped in routine sterile fashion and locked in pins.  The Stealth stereotactic navigation system was brought and registered and localized to old incision as being an adequate entry point.  So after prepping and draping the incision was made the old flap was medial identified the bur hole covers were removed and this flap was taken off.  The dura was then reflected and and the tumor was immediately visualized coming to the surface.  At times there was a very indistinct plane around it although sometimes there was a very tough fibrous component to it as well but in general is very difficult to identify clear margins around the tumor but we worked in a 360 degree orientation progressively working around and underneath what appeared to be the  capsule of tumor utilizing edematous white matter Azhar margin.  Several times throughout the case I used the stereotactic navigation probe to confirm location and resection bed.  And then after several specimen of been removed and there was no gross evidence of residual tumor and navigation confirmed that we are all along the perimeter.  Meticulous hemostasis was maintained the wound was copiously irrigated I used a piece of DuraGen to reconstruct the dural membrane and then we reattached the craniotomy flap and closed the wound in layers with active Vicryl and a running nylon.  At the end the case all needle counts and sponge counts were correct patient went recovery in stable condition.  My assistant Cala Bradford was there and assisted in all critical parts of the resection of the mass and closure.

## 2023-02-06 NOTE — Anesthesia Procedure Notes (Signed)
Arterial Line Insertion Start/End11/27/2024 8:00 AM, 02/06/2023 8:05 AM Performed by: Linton Rump, MD, anesthesiologist  Patient location: OR. Preanesthetic checklist: patient identified, IV checked, site marked, risks and benefits discussed, surgical consent, monitors and equipment checked, pre-op evaluation, timeout performed and anesthesia consent Left, radial was placed Catheter size: 20 G Hand hygiene performed  and maximum sterile barriers used   Attempts: 1 Procedure performed using ultrasound guided technique. Ultrasound Notes:anatomy identified, needle tip was noted to be adjacent to the nerve/plexus identified and no ultrasound evidence of intravascular and/or intraneural injection Following insertion, dressing applied and Biopatch. Post procedure assessment: normal  Patient tolerated the procedure well with no immediate complications.
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
## 2023-02-06 NOTE — OR Nursing (Signed)
Patient reported "no allergies" in pre-op interview.  During time-out noted patient allergy in chart to latex. MD aware.
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
## 2023-02-07 ENCOUNTER — Encounter (HOSPITAL_COMMUNITY): Payer: Self-pay | Admitting: Neurosurgery

## 2023-02-07 ENCOUNTER — Inpatient Hospital Stay (HOSPITAL_COMMUNITY): Payer: Medicaid Other

## 2023-02-07 DIAGNOSIS — Z9889 Other specified postprocedural states: Secondary | ICD-10-CM | POA: Diagnosis not present

## 2023-02-07 LAB — BASIC METABOLIC PANEL
Anion gap: 9 (ref 5–15)
BUN: 9 mg/dL (ref 6–20)
CO2: 23 mmol/L (ref 22–32)
Calcium: 9 mg/dL (ref 8.9–10.3)
Chloride: 101 mmol/L (ref 98–111)
Creatinine, Ser: 0.8 mg/dL (ref 0.61–1.24)
GFR, Estimated: >60 mL/min (ref >60)
Glucose, Bld: 131 mg/dL — ABNORMAL HIGH (ref 70–99)
Potassium: 3.8 mmol/L (ref 3.5–5.1)
Sodium: 133 mmol/L — ABNORMAL LOW (ref 135–145)

## 2023-02-07 MED ORDER — PROCHLORPERAZINE EDISYLATE 10 MG/2ML IJ SOLN
10.0000 mg | Freq: Four times a day (QID) | INTRAMUSCULAR | Status: AC | PRN
Start: 2023-02-07 — End: 2023-02-08
  Administered 2023-02-07 – 2023-02-08 (×2): 10 mg via INTRAVENOUS
  Filled 2023-02-07 (×2): qty 2

## 2023-02-07 MED ORDER — GADOBUTROL 1 MMOL/ML IV SOLN
8.0000 mL | Freq: Once | INTRAVENOUS | Status: AC | PRN
  Administered 2023-02-07: 8 mL via INTRAVENOUS

## 2023-02-07 MED ORDER — SCOPOLAMINE 1 MG/3DAYS TD PT72
1.0000 | MEDICATED_PATCH | TRANSDERMAL | Status: DC
  Administered 2023-02-07: 1.5 mg via TRANSDERMAL
  Filled 2023-02-07: qty 1

## 2023-02-07 NOTE — Progress Notes (Signed)
Subjective: Patient reports doing well, minimal headaches  Objective: Vital signs in last 24 hours: Temp:  [97 F (36.1 C)-99.1 F (37.3 C)] 99.1 F (37.3 C) (11/28 0800) Pulse Rate:  [61-85] 77 (11/28 0812) Resp:  [7-26] 9 (11/28 0812) BP: (111-153)/(69-89) 130/83 (11/28 0800) SpO2:  [94 %-100 %] 96 % (11/28 0812) Arterial Line BP: (130-187)/(58-89) 161/72 (11/28 0812)  Intake/Output from previous day: 11/27 0701 - 11/28 0700 In: 2266.4 [I.V.:2166.4; IV Piggyback:100] Out: 3900 [Urine:3900] Intake/Output this shift: Total I/O In: 75.1 [I.V.:75.1] Out: 1475 [Urine:1475]  Neurologic: Grossly normal  Lab Results: Lab Results  Component Value Date   WBC 10.0 02/06/2023   HGB 10.1 (L) 02/06/2023   HCT 32.0 (L) 02/06/2023   MCV 77.3 (L) 02/06/2023   PLT 260 02/06/2023   Lab Results  Component Value Date   INR 1.00 06/12/2017   BMET Lab Results  Component Value Date   NA 133 (L) 02/07/2023   K 3.8 02/07/2023   CL 101 02/07/2023   CO2 23 02/07/2023   GLUCOSE 131 (H) 02/07/2023   BUN 9 02/07/2023   CREATININE 0.80 02/07/2023   CALCIUM 9.0 02/07/2023    Studies/Results: MR BRAIN W WO CONTRAST  Result Date: 02/07/2023 CLINICAL DATA:  Brain/CNS neoplasm. Status post craniotomy. Recurrent right temporal lobe tumor following previous craniotomy and stereotactic surgery. EXAM: MRI HEAD WITHOUT AND WITH CONTRAST TECHNIQUE: Multiplanar, multiecho pulse sequences of the brain and surrounding structures were obtained without and with intravenous contrast. CONTRAST:  8mL GADAVIST GADOBUTROL 1 MMOL/ML IV SOLN COMPARISON:  MR head without and with contrast 01/02/2023 and 10/10/2022. FINDINGS: Brain: The bulk of the enhancing tumor involving the posterior temporal and occipital lobe was resected. 10 mm of enhancement remains along the inferior margin of the resection cavity. 8 mm of residual enhancement is in the noted along the anterior margin of the resection cavity. Surrounding  T2 and FLAIR hyperintensity is stable. Enhancing lesion in the left frontal operculum measuring 15 x 13 mm is stable. The The more posteroinferior right occipital lesion is less conspicuous than on the prior exam. Measures 5 mm maximally. A 7 mm right cerebellar lesion is stable. No new foci of enhancement or T2 signal change is present. Blood products are evident within the resection cavity. Fluid is present at the craniotomy site. Ventricles are of normal size. Brainstem and cerebellum are otherwise within normal limits. The internal auditory canals are within normal limits. Midline structures are within normal limits. Vascular: Flow is present in the major intracranial arteries. Skull and upper cervical spine: The craniocervical junction is normal. Upper cervical spine is within normal limits. Marrow signal is unremarkable. Sinuses/Orbits: The paranasal sinuses and mastoid air cells are clear. The globes and orbits are within normal limits. Other: IMPRESSION: 1. Interval resection of the bulk of the enhancing tumor involving the posterior temporal and occipital lobe. 2. 10 mm of enhancement remains along the inferior margin of the resection cavity. 3. 8 mm of residual enhancement is noted along the anterior margin of the resection cavity. 4. Stable 15 x 13 mm enhancing lesion in the left frontal operculum. 5. Stable 7 mm right cerebellar lesion. 6. No new foci of enhancement or T2 signal change. Electronically Signed   By: Marin Roberts M.D.   On: 02/07/2023 11:05    Assessment/Plan: Postop day 1 right temporal met resection. Will d/c aline and oob today.    LOS: 1 day    Eric Mason 02/07/2023, 11:10 AM
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
## 2023-02-08 ENCOUNTER — Inpatient Hospital Stay (HOSPITAL_COMMUNITY): Payer: Medicaid Other

## 2023-02-08 DIAGNOSIS — Z9889 Other specified postprocedural states: Secondary | ICD-10-CM | POA: Diagnosis not present

## 2023-02-08 MED ORDER — FUROSEMIDE 10 MG/ML IJ SOLN
40.0000 mg | Freq: Once | INTRAMUSCULAR | Status: AC
  Administered 2023-02-08: 40 mg via INTRAVENOUS
  Filled 2023-02-08: qty 4

## 2023-02-08 MED ORDER — PROCHLORPERAZINE EDISYLATE 10 MG/2ML IJ SOLN
5.0000 mg | INTRAMUSCULAR | Status: DC | PRN
  Administered 2023-02-08: 5 mg via INTRAVENOUS
  Filled 2023-02-08: qty 2

## 2023-02-08 MED ORDER — HYDRALAZINE HCL 20 MG/ML IJ SOLN
10.0000 mg | INTRAMUSCULAR | Status: DC | PRN
  Administered 2023-02-08 (×2): 20 mg via INTRAVENOUS
  Filled 2023-02-08 (×2): qty 1

## 2023-02-08 MED ORDER — SODIUM CHLORIDE 0.9 % IV SOLN
2.0000 g | INTRAVENOUS | Status: DC
  Administered 2023-02-08 – 2023-02-09 (×2): 2 g via INTRAVENOUS
  Filled 2023-02-08 (×2): qty 20

## 2023-02-08 MED ORDER — PROCHLORPERAZINE EDISYLATE 10 MG/2ML IJ SOLN
INTRAMUSCULAR | Status: AC
  Filled 2023-02-08: qty 2

## 2023-02-08 NOTE — Progress Notes (Signed)
Subjective: Patient reports he is doing okay but has not been out of bed.  Objective: Vital signs in last 24 hours: Temp:  [97.2 F (36.2 C)-101.8 F (38.8 C)] 99 F (37.2 C) (11/29 0800) Pulse Rate:  [64-100] 83 (11/29 0600) Resp:  [9-42] 33 (11/29 0600) BP: (127-177)/(52-103) 156/103 (11/29 0600) SpO2:  [85 %-97 %] 88 % (11/29 0600) Arterial Line BP: (161-175)/(71-72) 175/71 (11/28 1000)  Intake/Output from previous day: 11/28 0701 - 11/29 0700 In: 1346.3 [P.O.:600; I.V.:746.3] Out: 3075 [Urine:2475; Emesis/NG output:600] Intake/Output this shift: No intake/output data recorded.  Neurologic: Grossly normal  Lab Results: Lab Results  Component Value Date   WBC 10.0 02/06/2023   HGB 10.1 (L) 02/06/2023   HCT 32.0 (L) 02/06/2023   MCV 77.3 (L) 02/06/2023   PLT 260 02/06/2023   Lab Results  Component Value Date   INR 1.00 06/12/2017   BMET Lab Results  Component Value Date   NA 133 (L) 02/07/2023   K 3.8 02/07/2023   CL 101 02/07/2023   CO2 23 02/07/2023   GLUCOSE 131 (H) 02/07/2023   BUN 9 02/07/2023   CREATININE 0.80 02/07/2023   CALCIUM 9.0 02/07/2023    Studies/Results: DG Chest Port 1 View  Result Date: 02/08/2023 CLINICAL DATA:  Acute respiratory failure.  Aspiration. EXAM: PORTABLE CHEST 1 VIEW COMPARISON:  01/26/2023 FINDINGS: Interstitial coarsening from COPD. Patchy hazy density on the right over the mid and lower lung zones. No effusion or pneumothorax. Normal heart size and mediastinal contours. Porta catheter with tip at the upper cavoatrial junction. IMPRESSION: 1. Patchy, hazy density on the right consistent with pneumonia/aspiration. 2. Underlying COPD. Electronically Signed   By: Tiburcio Pea M.D.   On: 02/08/2023 06:39   MR BRAIN W WO CONTRAST  Result Date: 02/07/2023 CLINICAL DATA:  Brain/CNS neoplasm. Status post craniotomy. Recurrent right temporal lobe tumor following previous craniotomy and stereotactic surgery. EXAM: MRI HEAD WITHOUT  AND WITH CONTRAST TECHNIQUE: Multiplanar, multiecho pulse sequences of the brain and surrounding structures were obtained without and with intravenous contrast. CONTRAST:  8mL GADAVIST GADOBUTROL 1 MMOL/ML IV SOLN COMPARISON:  MR head without and with contrast 01/02/2023 and 10/10/2022. FINDINGS: Brain: The bulk of the enhancing tumor involving the posterior temporal and occipital lobe was resected. 10 mm of enhancement remains along the inferior margin of the resection cavity. 8 mm of residual enhancement is in the noted along the anterior margin of the resection cavity. Surrounding T2 and FLAIR hyperintensity is stable. Enhancing lesion in the left frontal operculum measuring 15 x 13 mm is stable. The The more posteroinferior right occipital lesion is less conspicuous than on the prior exam. Measures 5 mm maximally. A 7 mm right cerebellar lesion is stable. No new foci of enhancement or T2 signal change is present. Blood products are evident within the resection cavity. Fluid is present at the craniotomy site. Ventricles are of normal size. Brainstem and cerebellum are otherwise within normal limits. The internal auditory canals are within normal limits. Midline structures are within normal limits. Vascular: Flow is present in the major intracranial arteries. Skull and upper cervical spine: The craniocervical junction is normal. Upper cervical spine is within normal limits. Marrow signal is unremarkable. Sinuses/Orbits: The paranasal sinuses and mastoid air cells are clear. The globes and orbits are within normal limits. Other: IMPRESSION: 1. Interval resection of the bulk of the enhancing tumor involving the posterior temporal and occipital lobe. 2. 10 mm of enhancement remains along the inferior margin of the resection  cavity. 3. 8 mm of residual enhancement is noted along the anterior margin of the resection cavity. 4. Stable 15 x 13 mm enhancing lesion in the left frontal operculum. 5. Stable 7 mm right  cerebellar lesion. 6. No new foci of enhancement or T2 signal change. Electronically Signed   By: Marin Roberts M.D.   On: 02/07/2023 11:05    Assessment/Plan: Bed to status post craniotomy.  Mobilize to floor today.  PT OT.  Estimated body mass index is 26.83 kg/m as calculated from the following:   Height as of this encounter: 5\' 10"  (1.778 m).   Weight as of this encounter: 84.8 kg.    LOS: 2 days    Tia Alert 02/08/2023, 8:08 AM

## 2023-02-08 NOTE — Plan of Care (Signed)
Pt was transferred to [id number], report given to RN. All belongings in room sent with Pt, Family has been made aware.
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
## 2023-02-08 NOTE — Progress Notes (Signed)
   02/08/23 1512  Vitals  Temp 97.6 F (36.4 C)  Temp Source Oral  BP (!) 129/110  MAP (mmHg) 117  BP Location Right Arm  BP Method Automatic  Patient Position (if appropriate) Lying  Pulse Rate (!) 123  Pulse Rate Source Monitor  ECG Heart Rate (!) 121  Resp 17  Level of Consciousness  Level of Consciousness Responds to Voice  Oxygen Therapy  SpO2 100 %  O2 Device Nasal Cannula  O2 Flow Rate (L/min) 6 L/min   Transfer from 4NICU

## 2023-02-08 NOTE — Progress Notes (Signed)
? ?  Inpatient Rehab Admissions Coordinator : ? ?Per therapy recommendations, patient was screened for CIR candidacy by Blue Ruggerio RN MSN.  At this time patient appears to be a potential candidate for CIR. I will place a rehab consult per protocol for full assessment. Please call me with any questions. ? ?Jerriyah Louis RN MSN ?Admissions Coordinator ?[phone] ?  ?

## 2023-02-08 NOTE — Progress Notes (Signed)
Patient assessed for PIV no sites visualized with [place] assessment or visual inspection.  Veins too small for PIV as well as midline.  Patient does have a brachial vein on left of appropriate size but it is positioned directly under the nerve bundle for entire length.  Bedside RN made aware.  Second IV team nurse requested to come, states that previous IV was in patient's foot.
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
## 2023-02-09 ENCOUNTER — Encounter: Payer: Self-pay | Admitting: Internal Medicine

## 2023-02-09 ENCOUNTER — Other Ambulatory Visit (HOSPITAL_COMMUNITY): Payer: Self-pay

## 2023-02-09 DIAGNOSIS — Z9889 Other specified postprocedural states: Secondary | ICD-10-CM

## 2023-02-09 MED ORDER — AMOXICILLIN-POT CLAVULANATE 875-125 MG PO TABS
1.0000 | ORAL_TABLET | Freq: Two times a day (BID) | ORAL | 0 refills | Status: AC
  Filled 2023-02-09: qty 6, 3d supply, fill #0

## 2023-02-09 MED ORDER — OXYCODONE HCL 15 MG PO TABS
15.0000 mg | ORAL_TABLET | ORAL | 0 refills | Status: DC | PRN
  Filled 2023-02-09: qty 30, 5d supply, fill #0

## 2023-02-09 MED ORDER — DEXAMETHASONE 2 MG PO TABS
2.0000 mg | ORAL_TABLET | Freq: Two times a day (BID) | ORAL | 0 refills | Status: DC
  Filled 2023-02-09: qty 30, 15d supply, fill #0

## 2023-02-09 NOTE — Progress Notes (Signed)
Inpatient Rehab Admissions Coordinator:  Consult received. Note pt is discharging home today. AC will sign off.   Wolfgang Phoenix, MS, CCC-SLP Admissions Coordinator [phone]

## 2023-02-09 NOTE — Discharge Summary (Signed)
Physician Discharge Summary  Patient ID: Braelan Gieser MRN: [id number] DOB/AGE: [birth year] 52 y.o. Estimated body mass index is 26.83 kg/m as calculated from the following:   Height as of this encounter: 5\' 10"  (1.778 m).   Weight as of this encounter: 84.8 kg.   Admit date: 02/06/2023 Discharge date: 02/09/2023  Admission Diagnoses: Recurrent right temporal met  Discharge Diagnoses: Same Principal Problem:   S/P craniotomy Active Problems:   Metastasis to brain [facility])   Discharged Condition: good  Hospital Course: Patient is admitted to hospital underwent redo craniotomy for resection of recurrent right temporal met.  Postop patient did fairly well with covering the floor on the floor was ambulating and voiding spontaneously tolerating regular diet.  Follow-up MRI scan did show gross total resection with a small rim of enhancement consistent with residual mass posterior and deep.  Could still be just residual radiation necrosis which is what the gross appearance of the resection mass was.  However will await final pathology.  Patient stable for discharge home with scheduled follow-up in 1 to 2 weeks.  Consults: Significant Diagnostic Studies: Treatments: Redo craniotomy for resection of recurrent mass Discharge Exam: Blood pressure (!) 142/94, pulse (!) 118, temperature 98.7 F (37.1 C), temperature source Oral, resp. rate 18, height 5\' 10"  (1.778 m), weight 84.8 kg, SpO2 96%. Strength 5 out of 5 wound clean dry and intact  Disposition: Home   Allergies as of 02/09/2023       Reactions   Latex Itching, Rash        Medication List     TAKE these medications    albuterol 108 (90 Base) MCG/ACT inhaler Commonly known as: ProAir HFA Inhale 2 puffs into the lungs every 6 (six) hours as needed for wheezing or shortness of breath.(200/8=25)   amitriptyline 100 MG tablet Commonly known as: ELAVIL TAKE 1 TABLET BY MOUTH AT BEDTIME   amLODipine 5 MG  tablet Commonly known as: NORVASC Take 1 tablet (5 mg total) by mouth daily.   B-complex with vitamin C tablet Take 1 tablet by mouth daily.   baclofen 10 MG tablet Commonly known as: LIORESAL TAKE 1 TABLET BY MOUTH 3 TIMES DAILY   dexamethasone 4 MG tablet Commonly known as: DECADRON TAKE 1 TABLET BY MOUTH ONCE DAILY What changed: Another medication with the same name was added. Make sure you understand how and when to take each.   dexamethasone 2 MG tablet Commonly known as: DECADRON Take 1 tablet (2 mg total) by mouth 2 (two) times daily. What changed: You were already taking a medication with the same name, and this prescription was added. Make sure you understand how and when to take each.   diclofenac Sodium 1 % Gel Commonly known as: Voltaren Apply 2 g topically 4 (four) times daily as needed.   DULoxetine 30 MG capsule Commonly known as: CYMBALTA Take 1 capsule (30 mg total) by mouth daily.   folic acid 1 MG tablet Commonly known as: FOLVITE Take 1 tablet (1 mg total) by mouth daily.   gabapentin 800 MG tablet Commonly known as: NEURONTIN TAKE 1 TABLET BY MOUTH 3 TIMES DAILY   levETIRAcetam 1000 MG tablet Commonly known as: KEPPRA Take 2 tablets (2,000 mg total) by mouth 2 (two) times daily.   lidocaine 5 % Commonly known as: LIDODERM APPLY 1 PATCH, LEAVE ON FOR 12 HOURS AND THEN TAKE OFF AND LEAVE OFF FOR 12 HOURS BEFORE APPLYING ANOTHER PATCH What changed: See the new instructions.   meloxicam  15 MG tablet Commonly known as: MOBIC Take 1 tablet (15 mg total) by mouth daily.   multivitamin tablet Take 1 tablet by mouth daily.   oxyCODONE 15 MG immediate release tablet Commonly known as: ROXICODONE Take 1 tablet (15 mg total) by mouth every 4 (four) hours as needed. for pain What changed: Another medication with the same name was added. Make sure you understand how and when to take each.   oxyCODONE 15 MG immediate release tablet Commonly known as:  ROXICODONE Take 1 tablet (15 mg total) by mouth every 4 (four) hours as needed for moderate pain (pain score 4-6). What changed: You were already taking a medication with the same name, and this prescription was added. Make sure you understand how and when to take each.   pantoprazole 40 MG tablet Commonly known as: PROTONIX Take 1 tablet(s) by mouth TWO TIMES DAILY   polyethylene glycol 17 g packet Commonly known as: MIRALAX / GLYCOLAX Take 17 g by mouth daily as needed for moderate constipation.   Vitamin D 50 MCG (2000 UT) Caps Take 2,000 Units by mouth daily.   Xtampza ER 9 MG C12a Generic drug: oxyCODONE ER Take 1 capsule by mouth every 8 (eight) hours.         Signed: Mariam Dollar 02/09/2023, 9:02 AM

## 2023-02-09 NOTE — Plan of Care (Signed)
  Problem: Health Behavior/Discharge Planning: Goal: Ability to manage health-related needs will improve Outcome: Adequate for Discharge   

## 2023-02-09 NOTE — Progress Notes (Signed)
PROGRESS NOTE    Eric Mason  ZOX:[id number] DOB: [birth year] DOA: 02/06/2023 PCP: Marcine Matar, MD    Brief Narrative:  Patient seen and examined.  He was preparing to be discharged.  52 year old gentleman with brain tumor brought to the OR for right craniotomy and evacuation of further tumor.  He was monitored in ICU and subsequently in neuro medical floor after the procedure.  There was suspicion of aspiration pneumonia and he was started on Rocephin.  Subjective: Denies any complaints . Pain is controlled .  He was going home. Assessment & Plan:   Med rec redone with removal of duplicate medications. Received 4 days of antibiotics, prescribed 3 additional days of Augmentin. Discharging home as per primary service.  DVT prophylaxis:    Code Status: Full code Family Communication: None at bedside Disposition Plan: Status is: Inpatient Remains inpatient appropriate because: Discharging home today     Consultants:  TRH  Procedures:  Craniotomy  Antimicrobials:  Rocephin     Objective: Vitals:   02/09/23 0526 02/09/23 0811 02/09/23 1000 02/09/23 1139  BP: 132/82 (!) 142/94    Pulse: 99 (!) 118    Resp:  18    Temp:  98.7 F (37.1 C)    TempSrc:  Oral    SpO2:  96% 93% 92%  Weight:      Height:        Intake/Output Summary (Last 24 hours) at 02/09/2023 1530 Last data filed at 02/09/2023 0740 Gross per 24 hour  Intake 354 ml  Output 150 ml  Net 204 ml   Filed Weights   02/06/23 0634  Weight: 84.8 kg    Examination:  Comfortably laying in bed.  On 2 L of oxygen.  Occasional conducted airway sounds mostly on the right side.    Data Reviewed: I have personally reviewed following labs and imaging studies  CBC: Recent Labs  Lab 02/06/23 1239  WBC 10.0  HGB 10.1*  HCT 32.0*  MCV 77.3*  PLT 260   Basic Metabolic Panel: Recent Labs  Lab 02/06/23 1239 02/07/23 0432  NA 137 133*  K 3.5 3.8  CL 104 101  CO2 23 23  GLUCOSE  146* 131*  BUN 9 9  CREATININE 0.73 0.80  CALCIUM 8.3* 9.0   GFR: Estimated Creatinine Clearance: 111.5 mL/min (by C-G formula based on SCr of 0.8 mg/dL). Liver Function Tests: No results for input(s): "AST", "ALT", "ALKPHOS", "BILITOT", "PROT", "ALBUMIN" in the last 168 hours. No results for input(s): "LIPASE", "AMYLASE" in the last 168 hours. No results for input(s): "AMMONIA" in the last 168 hours. Coagulation Profile: No results for input(s): "INR", "PROTIME" in the last 168 hours. Cardiac Enzymes: No results for input(s): "CKTOTAL", "CKMB", "CKMBINDEX", "TROPONINI" in the last 168 hours. BNP (last 3 results) No results for input(s): "PROBNP" in the last 8760 hours. HbA1C: No results for input(s): "HGBA1C" in the last 72 hours. CBG: No results for input(s): "GLUCAP" in the last 168 hours. Lipid Profile: No results for input(s): "CHOL", "HDL", "LDLCALC", "TRIG", "CHOLHDL", "LDLDIRECT" in the last 72 hours. Thyroid Function Tests: No results for input(s): "TSH", "T4TOTAL", "FREET4", "T3FREE", "THYROIDAB" in the last 72 hours. Anemia Panel: No results for input(s): "VITAMINB12", "FOLATE", "FERRITIN", "TIBC", "IRON", "RETICCTPCT" in the last 72 hours. Sepsis Labs: No results for input(s): "PROCALCITON", "LATICACIDVEN" in the last 168 hours.  Recent Results (from the past 240 hour(s))  MRSA Next Gen by PCR, Nasal     Status: None   Collection Time: 02/06/23  12:45 PM   Specimen: Nasal Mucosa; Nasal Swab  Result Value Ref Range Status   MRSA by PCR Next Gen NOT DETECTED NOT DETECTED Final    Comment: (NOTE) The GeneXpert MRSA Assay (FDA approved for NASAL specimens only), is one component of a comprehensive MRSA colonization surveillance program. It is not intended to diagnose MRSA infection nor to guide or monitor treatment for MRSA infections. Test performance is not FDA approved in patients less than 36 years old. Performed at [facility], [address]  [place], [place] [address]          Radiology Studies: DG Chest Port 1 View  Result Date: 02/08/2023 CLINICAL DATA:  Acute respiratory failure.  Aspiration. EXAM: PORTABLE CHEST 1 VIEW COMPARISON:  01/26/2023 FINDINGS: Interstitial coarsening from COPD. Patchy hazy density on the right over the mid and lower lung zones. No effusion or pneumothorax. Normal heart size and mediastinal contours. Porta catheter with tip at the upper cavoatrial junction. IMPRESSION: 1. Patchy, hazy density on the right consistent with pneumonia/aspiration. 2. Underlying COPD. Electronically Signed   By: Tiburcio Pea M.D.   On: 02/08/2023 06:39        Scheduled Meds:  amitriptyline  100 mg Oral QHS   B-complex with vitamin C  1 tablet Oral Daily   baclofen  10 mg Oral TID   Chlorhexidine Gluconate Cloth  6 each Topical Daily   cholecalciferol  2,000 Units Oral Daily   dexamethasone (DECADRON) injection  4 mg Intravenous Q8H   docusate sodium  100 mg Oral BID   DULoxetine  30 mg Oral Daily   folic acid  1 mg Oral Daily   gabapentin  800 mg Oral TID   levETIRAcetam  2,000 mg Oral BID   multivitamin with minerals  1 tablet Oral Daily   nicotine  21 mg Transdermal Daily   oxyCODONE  10 mg Oral Q12H   pantoprazole  40 mg Oral BID   scopolamine  1 patch Transdermal Q72H   Continuous Infusions:  cefTRIAXone (ROCEPHIN)  IV 2 g (02/09/23 0948)     LOS: 3 days    Time spent: 25 minutes    Dorcas Carrow, MD [facility]

## 2023-02-09 NOTE — Plan of Care (Signed)
  Problem: Clinical Measurements: Goal: Usual level of consciousness will be regained or maintained. Outcome: Progressing   Problem: Skin Integrity: Goal: Demonstration of wound healing without infection will improve Outcome: Progressing   Problem: Clinical Measurements: Goal: Will remain free from infection Outcome: Progressing   Problem: Education: Goal: Knowledge of the prescribed therapeutic regimen will improve Outcome: Not Progressing   Problem: Education: Goal: Knowledge of General Education information will improve Description: Including pain rating scale, medication(s)/side effects and non-pharmacologic comfort measures Outcome: Not Progressing   Problem: Clinical Measurements: Goal: Ability to maintain clinical measurements within normal limits will improve Outcome: Not Progressing Goal: Respiratory complications will improve Outcome: Not Progressing Goal: Cardiovascular complication will be avoided Outcome: Not Progressing   Problem: Activity: Goal: Risk for activity intolerance will decrease Outcome: Not Progressing   Problem: Pain Management: Goal: General experience of comfort will improve Outcome: Not Progressing

## 2023-02-10 DIAGNOSIS — M87051 Idiopathic aseptic necrosis of right femur: Secondary | ICD-10-CM | POA: Diagnosis not present

## 2023-02-11 ENCOUNTER — Other Ambulatory Visit: Payer: Self-pay | Admitting: Nurse Practitioner

## 2023-02-11 ENCOUNTER — Telehealth: Payer: Self-pay

## 2023-02-11 LAB — SURGICAL PATHOLOGY

## 2023-02-11 NOTE — Telephone Encounter (Signed)
This RN called pt pharmacy and released the old on his oxycodone ER and oxycodone IR per Lowella Bandy, NP. Verified with pharmacist and called pt to inform him. No answer, LVM and call back number.

## 2023-02-11 NOTE — Transitions of Care (Post Inpatient/ED Visit) (Signed)
   02/11/2023  Name: Eric Mason MRN: [id number] DOB: 1971/02/23  Today's TOC FU Call Status: Today's TOC FU Call Status:: Unsuccessful Call (1st Attempt) Unsuccessful Call (1st Attempt) Date: 02/11/23  Attempted to reach the patient regarding the most recent Inpatient/ED visit.  Follow Up Plan: Additional outreach attempts will be made to reach the patient to complete the Transitions of Care (Post Inpatient/ED visit) call.   Signature  Robyne Peers, RN

## 2023-02-12 ENCOUNTER — Telehealth: Payer: Self-pay

## 2023-02-12 NOTE — Transitions of Care (Post Inpatient/ED Visit) (Signed)
02/12/2023  Name: Eric Mason MRN: [id number] DOB: [birth year]  Today's TOC FU Call Status: Today's TOC FU Call Status:: Successful TOC FU Call Completed Unsuccessful Call (1st Attempt) Date: 02/11/23 [facility] FU Call Complete Date: 02/12/23 Patient's Name and Date of Birth confirmed.  Transition Care Management Follow-up Telephone Call Date of Discharge: 02/09/23 Discharge Facility: [facility]) Type of Discharge: Inpatient Admission Primary Inpatient Discharge Diagnosis:: s/p craniotomy How have you been since you were released from the hospital?: Same Any questions or concerns?: Yes Patient Questions/Concerns:: He said he is not sure how he can get more pain medication. He has some roxicodone and oxycodone ER but is running out.  He also said that his right foot is painful from an IV that was in his foot when he was in the hospital. Patient Questions/Concerns Addressed: Other: (instructed the patient to call oncology regarding the pain medication.  he said he received a call from the oncology office yesterday and needs to call them back. I instructed him to also let the oncology office know about the foot pain from the IV.)  Items Reviewed: Did you receive and understand the discharge instructions provided?: Yes Medications obtained,verified, and reconciled?: Yes (Medications Reviewed) Any new allergies since your discharge?: No Dietary orders reviewed?: Yes Type of Diet Ordered:: heart healthy Do you have support at home?: Yes People in Home: parent(s), sibling(s) Name of Support/Comfort Primary Source: He said he lives with his brother and also receives support from his mother.  Medications Reviewed Today: Medications Reviewed Today     Reviewed by Robyne Peers, RN (Case Manager) on 02/12/23 at [phone]  Med List Status: <None>   Medication Order Taking? Sig Documenting Provider Last Dose Status Informant  albuterol (PROAIR HFA) 108 (90 Base) MCG/ACT inhaler [id number] No  Inhale 2 puffs into the lungs every 6 (six) hours as needed for wheezing or shortness of breath.(200/8=25) Benjiman Core, MD 02/05/2023 Active Self  amitriptyline (ELAVIL) 100 MG tablet [id number] No TAKE 1 TABLET BY MOUTH AT BEDTIME Vaslow, Georgeanna Lea, MD Past Week Active Self  amoxicillin-clavulanate (AUGMENTIN) 875-125 MG tablet [id number]  Take 1 tablet by mouth 2 (two) times daily for 3 days. Dorcas Carrow, MD  Active   B Complex-C (B-COMPLEX WITH VITAMIN C) tablet [id number] No Take 1 tablet by mouth daily.  [provider] 02/05/2023 Active Self  baclofen (LIORESAL) 10 MG tablet [id number] No TAKE 1 TABLET BY MOUTH 3 TIMES DAILY Vaslow, Georgeanna Lea, MD Past Week Active Self  Cholecalciferol (VITAMIN D) 50 MCG (2000 UT) CAPS [id number] No Take 2,000 Units by mouth daily. [provider] 02/05/2023 Active Self  dexamethasone (DECADRON) 2 MG tablet [id number]  Take 1 tablet (2 mg total) by mouth 2 (two) times daily. Donalee Citrin, MD  Active   diclofenac Sodium (VOLTAREN) 1 % GEL [id number] No Apply 2 g topically 4 (four) times daily as needed. Pickenpack-Cousar, Arty Baumgartner, NP Past Week Active Self  DULoxetine (CYMBALTA) 30 MG capsule [id number] No Take 1 capsule (30 mg total) by mouth daily. Pickenpack-Cousar, Arty Baumgartner, NP 02/06/2023 0400 Active Self  folic acid (FOLVITE) 1 MG tablet [id number] No Take 1 tablet (1 mg total) by mouth daily. Marcine Matar, MD 02/05/2023 Active Self  gabapentin (NEURONTIN) 800 MG tablet [id number] No TAKE 1 TABLET BY MOUTH 3 TIMES DAILY Henreitta Leber, MD 02/06/2023 0400 Active   levETIRAcetam (KEPPRA) 1000 MG tablet [id number] No Take 2 tablets (2,000 mg total) by mouth 2 (two) times daily. Elissa Hefty  K, MD 02/06/2023 0400 Active Self  lidocaine (LIDODERM) 5 % [id number] No APPLY 1 PATCH, LEAVE ON FOR 12 HOURS AND THEN TAKE OFF AND LEAVE OFF FOR 12 HOURS BEFORE APPLYING ANOTHER PATCH  Patient taking differently: Place 1 patch onto the skin daily as  needed (pain).   Pickenpack-Cousar, Arty Baumgartner, NP Taking Active Self  meloxicam (MOBIC) 15 MG tablet [id number] No Take 1 tablet (15 mg total) by mouth daily. Pickenpack-Cousar, Arty Baumgartner, NP Past Week Active Self  Multiple Vitamin (MULTIVITAMIN) tablet [id number] No Take 1 tablet by mouth daily. Marguerita Merles [place] 02/05/2023 Active Self  oxyCODONE (ROXICODONE) 15 MG immediate release tablet [id number]  Take 1 tablet (15 mg total) by mouth every 4 (four) hours as needed for moderate pain (pain score 4-6). Donalee Citrin, MD  Active   oxyCODONE ER [facility] ER) 9 MG C12A [id number] No Take 1 capsule by mouth every 8 (eight) hours. Pickenpack-Cousar, Arty Baumgartner, NP 02/06/2023 0400 Active   pantoprazole (PROTONIX) 40 MG tablet [id number] No Take 1 tablet(s) by mouth TWO TIMES DAILY Heilingoetter, Cassandra L, PA-C Past Week Active Self  polyethylene glycol (MIRALAX / GLYCOLAX) 17 g packet [id number] No Take 17 g by mouth daily as needed for moderate constipation. Leslye Peer, MD Past Week Active Self  Med List Note Gerrit Halls, RN 09/15/18 1231):                Home Care and Equipment/Supplies: Were Home Health Services Ordered?: No Any new equipment or medical supplies ordered?: No  Functional Questionnaire: Do you need assistance with bathing/showering or dressing?: No Do you need assistance with meal preparation?: Yes (He said his mother provides assistance.) Do you need assistance with eating?: No Do you have difficulty maintaining continence: No Do you need assistance with getting out of bed/getting out of a chair/moving?: Yes (ambulating with a cane and he said his right foot is painful from an IV that was in his foot when he was hospitalized.) Do you have difficulty managing or taking your medications?: No  Follow up appointments reviewed: PCP Follow-up appointment confirmed?: No (Dr Laural Benes is listed as PCP but he has not seen her in 3 years.  I offered to schedule an appointment  to re-establish care but he said he wants to wait and will call for an appointment when he is ready.) MD Provider Line Number:[phone] Given: No Specialist Hospital Follow-up appointment confirmed?: Yes Date of Specialist follow-up appointment?: 02/28/23 Follow-Up Specialty Provider:: palliative care and 03/04/2023- oncology Do you need transportation to your follow-up appointment?: No (he said his mother will take him to appts.) Do you understand care options if your condition(s) worsen?: Yes-patient verbalized understanding    SIGNATURE Robyne Peers, RN

## 2023-02-12 NOTE — Telephone Encounter (Signed)
Pt called and this RN spoke with him and confirmed that his medications can be picked up. Pt verbalized understanding and has no further needs at this time.

## 2023-02-13 ENCOUNTER — Telehealth: Payer: Self-pay

## 2023-02-13 NOTE — Telephone Encounter (Signed)
Received a voicemail from pt daughter asking for a callback, did not answer return call, attempted to call pt, also did not answer, LVM and callback number.

## 2023-02-18 ENCOUNTER — Inpatient Hospital Stay: Payer: Medicaid Other | Attending: Internal Medicine

## 2023-02-18 DIAGNOSIS — C801 Malignant (primary) neoplasm, unspecified: Secondary | ICD-10-CM | POA: Insufficient documentation

## 2023-02-18 DIAGNOSIS — F17290 Nicotine dependence, other tobacco product, uncomplicated: Secondary | ICD-10-CM | POA: Insufficient documentation

## 2023-02-18 DIAGNOSIS — F17210 Nicotine dependence, cigarettes, uncomplicated: Secondary | ICD-10-CM | POA: Insufficient documentation

## 2023-02-18 DIAGNOSIS — Z7952 Long term (current) use of systemic steroids: Secondary | ICD-10-CM | POA: Insufficient documentation

## 2023-02-18 DIAGNOSIS — Z79899 Other long term (current) drug therapy: Secondary | ICD-10-CM | POA: Insufficient documentation

## 2023-02-18 DIAGNOSIS — C7931 Secondary malignant neoplasm of brain: Secondary | ICD-10-CM | POA: Insufficient documentation

## 2023-02-18 DIAGNOSIS — C7951 Secondary malignant neoplasm of bone: Secondary | ICD-10-CM | POA: Insufficient documentation

## 2023-02-20 ENCOUNTER — Other Ambulatory Visit: Payer: Self-pay

## 2023-02-20 ENCOUNTER — Other Ambulatory Visit: Payer: Self-pay | Admitting: Internal Medicine

## 2023-02-20 DIAGNOSIS — C7931 Secondary malignant neoplasm of brain: Secondary | ICD-10-CM
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
## 2023-02-25 ENCOUNTER — Telehealth: Payer: Self-pay | Admitting: Internal Medicine

## 2023-02-28 ENCOUNTER — Inpatient Hospital Stay: Payer: Medicaid Other

## 2023-03-01 ENCOUNTER — Inpatient Hospital Stay: Payer: Medicaid Other | Admitting: Nurse Practitioner

## 2023-03-04 ENCOUNTER — Telehealth: Payer: Self-pay

## 2023-03-04 ENCOUNTER — Inpatient Hospital Stay: Payer: Medicaid Other | Admitting: Internal Medicine

## 2023-03-04 NOTE — Telephone Encounter (Signed)
Pt called stating he had an appt today he thinks with Palliative Care.  Pt stated he had surgery in [facility] where they removed a tumor in his head.  Pt stated his telephone number has changed and he thinks that someone from the Cancer Center called him but was unable to reach the pt; therefore, the pt's brother was called.  Pt stated he didn't know for sure if he had an appt.  Stated the appt was with Dr. Arbutus Ped today at 8:15am.  Informed pt that he has appts scheduled on 03/12/2023 starting at 9am w/lab, Dr. Barbaraann Cao, and then w/Nikki in Palliative Care.  Pt stated he would put the appts on his calendar but asked if this nurse could update his telephone number in our system.  Pt provided new telephone number.

## 2023-03-05 ENCOUNTER — Other Ambulatory Visit: Payer: Self-pay | Admitting: Nurse Practitioner

## 2023-03-05 DIAGNOSIS — Z515 Encounter for palliative care: Secondary | ICD-10-CM

## 2023-03-05 DIAGNOSIS — C3490 Malignant neoplasm of unspecified part of unspecified bronchus or lung: Secondary | ICD-10-CM
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
## 2023-03-12 ENCOUNTER — Inpatient Hospital Stay (HOSPITAL_BASED_OUTPATIENT_CLINIC_OR_DEPARTMENT_OTHER): Payer: Medicaid Other | Admitting: Internal Medicine

## 2023-03-12 ENCOUNTER — Inpatient Hospital Stay (HOSPITAL_BASED_OUTPATIENT_CLINIC_OR_DEPARTMENT_OTHER): Payer: Medicaid Other | Admitting: Nurse Practitioner

## 2023-03-12 ENCOUNTER — Inpatient Hospital Stay: Payer: Medicaid Other

## 2023-03-12 ENCOUNTER — Encounter: Payer: Self-pay | Admitting: Internal Medicine

## 2023-03-12 ENCOUNTER — Encounter: Payer: Self-pay | Admitting: Nurse Practitioner

## 2023-03-12 VITALS — BP 112/82 | HR 73 | Temp 97.1°F | Resp 15 | Ht 70.0 in | Wt 165.0 lb

## 2023-03-12 DIAGNOSIS — M79604 Pain in right leg: Secondary | ICD-10-CM

## 2023-03-12 DIAGNOSIS — M79605 Pain in left leg: Secondary | ICD-10-CM | POA: Diagnosis not present

## 2023-03-12 DIAGNOSIS — C801 Malignant (primary) neoplasm, unspecified: Secondary | ICD-10-CM | POA: Diagnosis present

## 2023-03-12 DIAGNOSIS — C3490 Malignant neoplasm of unspecified part of unspecified bronchus or lung: Secondary | ICD-10-CM | POA: Diagnosis not present

## 2023-03-12 DIAGNOSIS — Z515 Encounter for palliative care: Secondary | ICD-10-CM

## 2023-03-12 DIAGNOSIS — F17290 Nicotine dependence, other tobacco product, uncomplicated: Secondary | ICD-10-CM | POA: Diagnosis not present

## 2023-03-12 DIAGNOSIS — C763 Malignant neoplasm of pelvis: Secondary | ICD-10-CM

## 2023-03-12 DIAGNOSIS — G893 Neoplasm related pain (acute) (chronic): Secondary | ICD-10-CM

## 2023-03-12 DIAGNOSIS — F17210 Nicotine dependence, cigarettes, uncomplicated: Secondary | ICD-10-CM | POA: Diagnosis not present

## 2023-03-12 DIAGNOSIS — M62838 Other muscle spasm: Secondary | ICD-10-CM

## 2023-03-12 DIAGNOSIS — Z79899 Other long term (current) drug therapy: Secondary | ICD-10-CM | POA: Diagnosis not present

## 2023-03-12 DIAGNOSIS — C7951 Secondary malignant neoplasm of bone: Secondary | ICD-10-CM | POA: Diagnosis present

## 2023-03-12 DIAGNOSIS — C7931 Secondary malignant neoplasm of brain: Secondary | ICD-10-CM

## 2023-03-12 DIAGNOSIS — Z7952 Long term (current) use of systemic steroids: Secondary | ICD-10-CM | POA: Diagnosis not present

## 2023-03-12 LAB — RAPID URINE DRUG SCREEN, HOSP PERFORMED
Amphetamines: NOT DETECTED
Barbiturates: NOT DETECTED
Benzodiazepines: NOT DETECTED
Cocaine: NOT DETECTED
Opiates: NOT DETECTED
Tetrahydrocannabinol: POSITIVE — AB

## 2023-03-12 LAB — COMPREHENSIVE METABOLIC PANEL
ALT: 8 U/L (ref 0–44)
AST: 12 U/L — ABNORMAL LOW (ref 15–41)
Albumin: 3.2 g/dL — ABNORMAL LOW (ref 3.5–5.0)
Alkaline Phosphatase: 103 U/L (ref 38–126)
Anion gap: 10 (ref 5–15)
BUN: <5 mg/dL — ABNORMAL LOW (ref 6–20)
CO2: 25 mmol/L (ref 22–32)
Calcium: 8.8 mg/dL — ABNORMAL LOW (ref 8.9–10.3)
Chloride: 104 mmol/L (ref 98–111)
Creatinine, Ser: 0.57 mg/dL — ABNORMAL LOW (ref 0.61–1.24)
GFR, Estimated: >60 mL/min (ref >60)
Glucose, Bld: 112 mg/dL — ABNORMAL HIGH (ref 70–99)
Potassium: 3 mmol/L — ABNORMAL LOW (ref 3.5–5.1)
Sodium: 139 mmol/L (ref 135–145)
Total Bilirubin: 0.3 mg/dL (ref 0.0–1.2)
Total Protein: 7.5 g/dL (ref 6.5–8.1)

## 2023-03-12 LAB — CBC WITH DIFFERENTIAL/PLATELET
Abs Immature Granulocytes: 0.04 10*3/uL (ref 0.00–0.07)
Basophils Absolute: 0 10*3/uL (ref 0.0–0.1)
Basophils Relative: 0 %
Eosinophils Absolute: 0.1 10*3/uL (ref 0.0–0.5)
Eosinophils Relative: 1 %
HCT: 31.4 % — ABNORMAL LOW (ref 39.0–52.0)
Hemoglobin: 9.9 g/dL — ABNORMAL LOW (ref 13.0–17.0)
Immature Granulocytes: 0 %
Lymphocytes Relative: 43 %
Lymphs Abs: 4.2 10*3/uL — ABNORMAL HIGH (ref 0.7–4.0)
MCH: 24 pg — ABNORMAL LOW (ref 26.0–34.0)
MCHC: 31.5 g/dL (ref 30.0–36.0)
MCV: 76.2 fL — ABNORMAL LOW (ref 80.0–100.0)
Monocytes Absolute: 0.8 10*3/uL (ref 0.1–1.0)
Monocytes Relative: 9 %
Neutro Abs: 4.5 10*3/uL (ref 1.7–7.7)
Neutrophils Relative %: 47 %
Platelets: 530 10*3/uL — ABNORMAL HIGH (ref 150–400)
RBC: 4.12 MIL/uL — ABNORMAL LOW (ref 4.22–5.81)
RDW: 17.2 % — ABNORMAL HIGH (ref 11.5–15.5)
WBC: 9.8 10*3/uL (ref 4.0–10.5)
nRBC: 0 % (ref 0.0–0.2)

## 2023-03-12 MED ORDER — DEXAMETHASONE 2 MG PO TABS: 2.0000 mg | ORAL_TABLET | Freq: Every day | ORAL | 0 refills | Status: DC

## 2023-03-12 MED ORDER — OXYCODONE HCL 15 MG PO TABS: 15.0000 mg | ORAL_TABLET | ORAL | 0 refills | Status: DC | PRN

## 2023-03-12 MED ORDER — BACLOFEN 10 MG PO TABS: 10.0000 mg | ORAL_TABLET | Freq: Three times a day (TID) | ORAL | 5 refills | Status: DC

## 2023-03-12 MED ORDER — XTAMPZA ER 9 MG PO C12A: 1.0000 | EXTENDED_RELEASE_CAPSULE | Freq: Three times a day (TID) | ORAL | 0 refills | Status: DC

## 2023-03-12 MED ORDER — POTASSIUM CHLORIDE CRYS ER 20 MEQ PO TBCR: 20.0000 meq | EXTENDED_RELEASE_TABLET | Freq: Every day | ORAL | 0 refills | Status: AC
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
## 2023-03-13 DIAGNOSIS — M87051 Idiopathic aseptic necrosis of right femur: Secondary | ICD-10-CM | POA: Diagnosis not present

## 2023-03-14 ENCOUNTER — Telehealth: Payer: Self-pay | Admitting: Internal Medicine

## 2023-03-14 NOTE — Telephone Encounter (Signed)
 Called patient left scheduled messages details on vm.

## 2023-03-14 NOTE — Telephone Encounter (Signed)
 Called patient to schedule 14 week f/u visit. NO answer left message of scheduled appt for 04/08 @1030  and MD visit @1100 .[name]

## 2023-03-15 ENCOUNTER — Other Ambulatory Visit: Payer: Self-pay | Admitting: Radiation Therapy

## 2023-03-25 ENCOUNTER — Telehealth: Payer: Self-pay | Admitting: *Deleted

## 2023-03-25 ENCOUNTER — Other Ambulatory Visit: Payer: Self-pay

## 2023-03-25 DIAGNOSIS — C3490 Malignant neoplasm of unspecified part of unspecified bronchus or lung: Secondary | ICD-10-CM

## 2023-03-25 DIAGNOSIS — M79604 Pain in right leg: Secondary | ICD-10-CM

## 2023-03-25 DIAGNOSIS — C7931 Secondary malignant neoplasm of brain: Secondary | ICD-10-CM

## 2023-03-25 DIAGNOSIS — G893 Neoplasm related pain (acute) (chronic): Secondary | ICD-10-CM

## 2023-03-25 DIAGNOSIS — Z515 Encounter for palliative care: Secondary | ICD-10-CM

## 2023-03-25 MED ORDER — AMITRIPTYLINE HCL 100 MG PO TABS: 100.0000 mg | ORAL_TABLET | Freq: Every day | ORAL | 1 refills | Status: DC

## 2023-03-25 MED ORDER — LEVETIRACETAM 1000 MG PO TABS: 2000.0000 mg | ORAL_TABLET | Freq: Two times a day (BID) | ORAL | 1 refills | Status: DC

## 2023-03-25 MED ORDER — OXYCODONE HCL 15 MG PO TABS: 15.0000 mg | ORAL_TABLET | ORAL | 0 refills | Status: DC | PRN

## 2023-03-25 MED ORDER — GABAPENTIN 800 MG PO TABS: 800.0000 mg | ORAL_TABLET | Freq: Three times a day (TID) | ORAL | 5 refills | Status: DC

## 2023-03-25 NOTE — Telephone Encounter (Signed)
 Patient called office to request refills of several things.  Routing to [place].  Patient requested Oxycodone and Oxycodone ER to [facility].

## 2023-03-25 NOTE — Addendum Note (Signed)
 Addended by: Henreitta Leber on: 03/25/2023 12:43 PM   Modules accepted: Orders

## 2023-03-25 NOTE — Telephone Encounter (Signed)
 Patient called requesting refill of amitriptyline, gabapentin and Keppra to [facility] pharmacy

## 2023-03-25 NOTE — Telephone Encounter (Signed)
 Pt called for medication refill, see associated orders

## 2023-04-08 ENCOUNTER — Other Ambulatory Visit: Payer: Self-pay

## 2023-04-08 ENCOUNTER — Telehealth: Payer: Self-pay | Admitting: *Deleted

## 2023-04-08 DIAGNOSIS — Z515 Encounter for palliative care: Secondary | ICD-10-CM

## 2023-04-08 DIAGNOSIS — C7931 Secondary malignant neoplasm of brain: Secondary | ICD-10-CM

## 2023-04-08 DIAGNOSIS — C3490 Malignant neoplasm of unspecified part of unspecified bronchus or lung: Secondary | ICD-10-CM

## 2023-04-08 DIAGNOSIS — M79605 Pain in left leg: Secondary | ICD-10-CM

## 2023-04-08 DIAGNOSIS — G893 Neoplasm related pain (acute) (chronic): Secondary | ICD-10-CM

## 2023-04-08 MED ORDER — OXYCODONE HCL 15 MG PO TABS: 15.0000 mg | ORAL_TABLET | ORAL | 0 refills | Status: DC | PRN

## 2023-04-08 MED ORDER — XTAMPZA ER 9 MG PO C12A: 1.0000 | EXTENDED_RELEASE_CAPSULE | Freq: Three times a day (TID) | ORAL | 0 refills | Status: DC

## 2023-04-08 NOTE — Telephone Encounter (Signed)
Patient called to advise that his left side (specifically his hip) has been giving him more trouble within the past 3 weeks.  States that he has to manually lift the leg to get it to move and standing up on it causes him to hear a popping sound.  Denies having any new falls.  States he has been on long term Decadron dosed at 1 mg for several years.    Wanting to find out what could be causing this.  Routing to Dr Barbaraann Cao if investigation by [place] is warranted or if this is PCP related?

## 2023-04-08 NOTE — Telephone Encounter (Signed)
Pt called for refill, see associated orders.

## 2023-04-15 ENCOUNTER — Encounter: Payer: Self-pay | Admitting: *Deleted

## 2023-04-15 ENCOUNTER — Telehealth: Payer: Self-pay | Admitting: *Deleted

## 2023-04-15 NOTE — Telephone Encounter (Signed)
Communicated Dr Liana Gerold response.  Patient states understanding.

## 2023-04-22 ENCOUNTER — Other Ambulatory Visit: Payer: Self-pay

## 2023-04-22 DIAGNOSIS — C7931 Secondary malignant neoplasm of brain: Secondary | ICD-10-CM

## 2023-04-22 DIAGNOSIS — C3490 Malignant neoplasm of unspecified part of unspecified bronchus or lung: Secondary | ICD-10-CM

## 2023-04-22 DIAGNOSIS — Z515 Encounter for palliative care: Secondary | ICD-10-CM

## 2023-04-22 DIAGNOSIS — G893 Neoplasm related pain (acute) (chronic): Secondary | ICD-10-CM

## 2023-04-22 MED ORDER — OXYCODONE HCL 15 MG PO TABS: 15.0000 mg | ORAL_TABLET | ORAL | 0 refills | Status: DC | PRN

## 2023-04-22 NOTE — Telephone Encounter (Signed)
 Pt called for medication refills see associated orders. Education provided on how to take pain medications, verbalized understanding.

## 2023-05-06 ENCOUNTER — Inpatient Hospital Stay: Payer: Medicaid Other | Attending: Internal Medicine | Admitting: Nurse Practitioner

## 2023-05-06 ENCOUNTER — Other Ambulatory Visit: Payer: Self-pay

## 2023-05-06 ENCOUNTER — Other Ambulatory Visit (HOSPITAL_BASED_OUTPATIENT_CLINIC_OR_DEPARTMENT_OTHER): Payer: Self-pay

## 2023-05-06 ENCOUNTER — Encounter: Payer: Self-pay | Admitting: Nurse Practitioner

## 2023-05-06 DIAGNOSIS — C7931 Secondary malignant neoplasm of brain: Secondary | ICD-10-CM | POA: Diagnosis not present

## 2023-05-06 DIAGNOSIS — Z515 Encounter for palliative care: Secondary | ICD-10-CM | POA: Diagnosis not present

## 2023-05-06 DIAGNOSIS — C3490 Malignant neoplasm of unspecified part of unspecified bronchus or lung: Secondary | ICD-10-CM

## 2023-05-06 DIAGNOSIS — G893 Neoplasm related pain (acute) (chronic): Secondary | ICD-10-CM

## 2023-05-06 DIAGNOSIS — K5903 Drug induced constipation: Secondary | ICD-10-CM | POA: Diagnosis not present

## 2023-05-06 MED ORDER — OXYCODONE HCL 15 MG PO TABS
15.0000 mg | ORAL_TABLET | ORAL | 0 refills | Status: DC | PRN
  Filled 2023-05-06: qty 90, 15d supply, fill #0

## 2023-05-06 MED ORDER — OXYCODONE HCL 15 MG PO TABS
15.0000 mg | ORAL_TABLET | ORAL | 0 refills | Status: DC | PRN
Start: 2023-05-06 — End: 2023-05-06

## 2023-05-06 NOTE — Telephone Encounter (Signed)
 Pt called for refill, see associated orders.
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
## 2023-05-08 ENCOUNTER — Telehealth: Payer: Medicaid Other

## 2023-05-22 ENCOUNTER — Telehealth: Payer: Self-pay

## 2023-05-22 ENCOUNTER — Other Ambulatory Visit: Payer: Self-pay

## 2023-05-22 ENCOUNTER — Other Ambulatory Visit: Payer: Self-pay | Admitting: Nurse Practitioner

## 2023-05-22 DIAGNOSIS — Z515 Encounter for palliative care: Secondary | ICD-10-CM

## 2023-05-22 DIAGNOSIS — C7931 Secondary malignant neoplasm of brain: Secondary | ICD-10-CM

## 2023-05-22 DIAGNOSIS — C7951 Secondary malignant neoplasm of bone: Secondary | ICD-10-CM

## 2023-05-22 DIAGNOSIS — C3490 Malignant neoplasm of unspecified part of unspecified bronchus or lung: Secondary | ICD-10-CM

## 2023-05-22 DIAGNOSIS — G893 Neoplasm related pain (acute) (chronic): Secondary | ICD-10-CM

## 2023-05-22 MED ORDER — OXYCODONE HCL ER 10 MG PO T12A
10.0000 mg | EXTENDED_RELEASE_TABLET | Freq: Two times a day (BID) | ORAL | 0 refills | Status: DC
Start: 2023-05-22 — End: 2023-06-25

## 2023-05-22 MED ORDER — OXYCODONE HCL 15 MG PO TABS: 15.0000 mg | ORAL_TABLET | ORAL | 0 refills | Status: DC | PRN

## 2023-05-22 MED ORDER — XTAMPZA ER 9 MG PO C12A: 9.0000 mg | EXTENDED_RELEASE_CAPSULE | Freq: Three times a day (TID) | ORAL | 0 refills | Status: DC

## 2023-05-22 NOTE — Telephone Encounter (Signed)
 Patient called for medication refill, see associated orders.

## 2023-05-22 NOTE — Telephone Encounter (Signed)
 Notified Patient of prior authorization approval for Oxycodone 15mg  IR Tablets. Medication is approved through 11/22/2023.Prior Authorization for Xtampza ER 9mg  was denied because the Manufacturer does not participate in the federal Medical Rebate Program. Provider was notified and medication changed to Oxycontin 10mg  Tablets which is a preferred drug.No other needs or concerns noted at this time.

## 2023-06-03 ENCOUNTER — Other Ambulatory Visit: Payer: Self-pay

## 2023-06-03 DIAGNOSIS — C3490 Malignant neoplasm of unspecified part of unspecified bronchus or lung: Secondary | ICD-10-CM

## 2023-06-03 DIAGNOSIS — C7931 Secondary malignant neoplasm of brain: Secondary | ICD-10-CM

## 2023-06-03 DIAGNOSIS — G893 Neoplasm related pain (acute) (chronic): Secondary | ICD-10-CM

## 2023-06-03 DIAGNOSIS — Z515 Encounter for palliative care: Secondary | ICD-10-CM

## 2023-06-03 MED ORDER — OXYCODONE HCL 15 MG PO TABS
15.0000 mg | ORAL_TABLET | ORAL | 0 refills | Status: DC | PRN
Start: 2023-06-05 — End: 2023-06-25

## 2023-06-03 NOTE — Telephone Encounter (Signed)
 Pt called for refill of medication, reports he was unable to pick up his oxycontin due to it being unavailable. This RN called preferred pharmacy and confirmed it was not dispensed but order was active with the pharmacy, will fill for pt pick up. Pt also requested refill of oxycodone, sent to pharmacy as well. Pt notified of updates, no further needs at this time

## 2023-06-13 ENCOUNTER — Inpatient Hospital Stay
Admission: RE | Admit: 2023-06-13 | Discharge: 2023-06-13 | Disposition: A | Discharge status: Home / Self Care | Payer: Medicaid Other | Source: Ambulatory Visit | Attending: Internal Medicine | Admitting: Internal Medicine

## 2023-06-13 ENCOUNTER — Other Ambulatory Visit: Payer: Self-pay | Admitting: Radiology

## 2023-06-14 ENCOUNTER — Telehealth: Payer: Self-pay | Admitting: Radiology

## 2023-06-14 ENCOUNTER — Telehealth: Payer: Self-pay | Admitting: Internal Medicine

## 2023-06-14 NOTE — Telephone Encounter (Signed)
 Called to inform the pt about his rescheduled MRI appt. Pt confirmed he will be able to make the new appt date of April 15th.

## 2023-06-14 NOTE — Telephone Encounter (Signed)
 Scheduled patient's appts. Advised patient to contact us if rescheduling is needed. Provided my direct line.

## 2023-06-17 ENCOUNTER — Inpatient Hospital Stay: Payer: Medicaid Other

## 2023-06-18 ENCOUNTER — Inpatient Hospital Stay: Payer: Medicaid Other

## 2023-06-18 ENCOUNTER — Inpatient Hospital Stay: Payer: Medicaid Other | Admitting: Internal Medicine

## 2023-06-18 ENCOUNTER — Inpatient Hospital Stay

## 2023-06-21 ENCOUNTER — Telehealth: Payer: Self-pay | Admitting: *Deleted

## 2023-06-21 NOTE — Telephone Encounter (Signed)
 Returned PC to patient, he called earlier saying he has a MRI on 06/25/23, but he is concerned because he thinks he may still have stitches or a scab in his incision from his craniotomy in November.  Patient states he never returned to Dr Lonie Peak office to have any stitches taken out.  Instructed patient to call Dr Lonie Peak office now to see if he can possibly be seen on Monday (06/24/23) before his MRI on Tuesday.  He verbalizes understanding.  Dr Lonie Peak office number given to patient.

## 2023-06-25 ENCOUNTER — Telehealth: Payer: Self-pay | Admitting: *Deleted

## 2023-06-25 ENCOUNTER — Other Ambulatory Visit: Payer: Self-pay | Admitting: Nurse Practitioner

## 2023-06-25 ENCOUNTER — Ambulatory Visit: Payer: Self-pay

## 2023-06-25 ENCOUNTER — Ambulatory Visit
Admission: RE | Admit: 2023-06-25 | Discharge: 2023-06-25 | Disposition: A | Discharge status: Home / Self Care | Source: Ambulatory Visit | Attending: Internal Medicine | Admitting: Internal Medicine

## 2023-06-25 DIAGNOSIS — C7931 Secondary malignant neoplasm of brain: Secondary | ICD-10-CM

## 2023-06-25 DIAGNOSIS — Z515 Encounter for palliative care: Secondary | ICD-10-CM

## 2023-06-25 DIAGNOSIS — C7951 Secondary malignant neoplasm of bone: Secondary | ICD-10-CM

## 2023-06-25 DIAGNOSIS — C3490 Malignant neoplasm of unspecified part of unspecified bronchus or lung: Secondary | ICD-10-CM

## 2023-06-25 DIAGNOSIS — G893 Neoplasm related pain (acute) (chronic): Secondary | ICD-10-CM

## 2023-06-25 MED ORDER — LORAZEPAM 2 MG PO TABS: 1.0000 mg | ORAL_TABLET | Freq: Once | ORAL | 0 refills | Status: AC | PRN

## 2023-06-25 MED ORDER — OXYCODONE HCL 15 MG PO TABS: 15.0000 mg | ORAL_TABLET | ORAL | 0 refills | Status: DC | PRN

## 2023-06-25 MED ORDER — OXYCODONE HCL ER 10 MG PO T12A
10.0000 mg | EXTENDED_RELEASE_TABLET | Freq: Two times a day (BID) | ORAL | 0 refills | Status: DC
Start: 2023-07-03 — End: 2023-09-25

## 2023-06-25 NOTE — Telephone Encounter (Signed)
 Message from [place] E sent at 06/25/2023  2:37 PM EDT  Summary: Swelling in his legs   Copied From CRM [id number]. Reason for Triage: Pt called reporting difficulty walking and swelling on his right knee and his groin area.  Best contact: [phone]         Chief Complaint: knee (bilateral) swelling going into groin area; hx ca, recent brain surgery Symptoms: see above Frequency: constant Pertinent Negatives: Patient denies fever, numbness, tingling Disposition: [] ED /[] Urgent Care (no appt availability in office) / [x] Appointment(In office/virtual)/ []  [place] [facility]/ [] Home Care/ [] Refused Recommended Disposition /[] [place] [facility]/ []  Follow-up with PCP Additional Notes: per protocol apt made; care advice given, denies questions; instructed to go to ER if becomes worse.   Reason for Disposition  [1] SEVERE pain (e.g., excruciating, unable to walk) AND [2] not improved after 2 hours of pain medicine  Answer Assessment - Initial Assessment Questions 1. LOCATION: "Where is the swelling located?"  (e.g., left, right, both knees)     Bilateral knees swollen going to hips and groin area 2. ONSET: "When did the swelling start?" "Does it come and go, or is it there all the time?"     Started last year 3. SWELLING: "How bad is the swelling?" Or, "How large is it?" (e.g., mild, moderate, severe; size of localized swelling)    - NONE: No joint swelling.   - LOCALIZED: Localized; small area of puffy or swollen skin (e.g., insect bite, skin irritation).   - MILD: Joint looks or feels mildly swollen or puffy.   - MODERATE: Swollen; interferes with normal activities (e.g., work or school); can't move joint normally (bend and straighten completely); may be limping.   - SEVERE: Very swollen; can't move swollen joint at all; limping a lot or unable to walk.     moderate 4. PAIN: "Is there any pain?" If Yes, ask: "How bad is it?" (Scale 1-10; or mild, moderate, severe)   - NONE (0):  no pain.   - MILD (1-3): doesn't interfere with normal activities.    - MODERATE (4-7): interferes with normal activities (e.g., work or school) or awakens from sleep, limping.    - SEVERE (8-10): excruciating pain, unable to do any normal activities, unable to walk.      8/10 5. SETTING: "Has there been any recent work, exercise or other activity that involved that part of the body?"      denies 6. AGGRAVATING FACTORS: "What makes the knee swelling worse?" (e.g., walking, climbing stairs, running)     If he lays on his sides 7. ASSOCIATED SYMPTOMS: "Is there any pain or redness?"     denies 8. OTHER SYMPTOMS: "Do you have any other symptoms?" (e.g., chest pain, difficulty breathing, fever, calf pain)     denies 9. PREGNANCY: "Is there any chance you are pregnant?" "When was your last menstrual period?"     na  Protocols used: Knee Swelling-A-AH

## 2023-06-25 NOTE — Telephone Encounter (Signed)
 PC received from patient, he said he was unable to have his MRI today, his IV came out, his heart was racing & he felt very anxious.  He states the MRI tech suggested he have anti-anxiety medication prescribed.  Dr Mark Sil informed, ativan prescribed.  MRI rescheduled at [facility] on 07/09/23 at 10:50, patient is to arrive at 10:20.  PC made to patient, no answer, left VM - informed him of new MRI appointment, ativan 1 mg has been prescribed - he is to take 1/2 tablet one hour before MRI.  Also informed him scheduling will contact him to reschedule his appointment with Dr Mark Sil & with Palliative Care.  Office number given for patient to call back with any questions.  Scheduling message sent.

## 2023-06-25 NOTE — Telephone Encounter (Signed)
 Pt has been schedule an appt with Jamal Mays for 4/21. Will forward to provider for any recommendations

## 2023-06-26 ENCOUNTER — Telehealth: Payer: Self-pay | Admitting: Internal Medicine

## 2023-06-26 NOTE — Telephone Encounter (Signed)
 Patient scheduled appointments. Patient is aware of all appointment details.

## 2023-06-26 NOTE — Telephone Encounter (Signed)
 Scheduled patient's appts. Advised patient to contact us if rescheduling is needed. Provided my direct line.

## 2023-06-27 ENCOUNTER — Ambulatory Visit: Admitting: Internal Medicine

## 2023-06-27 ENCOUNTER — Encounter

## 2023-06-27 ENCOUNTER — Other Ambulatory Visit

## 2023-07-01 ENCOUNTER — Encounter

## 2023-07-01 ENCOUNTER — Ambulatory Visit: Admitting: Nurse Practitioner

## 2023-07-09 ENCOUNTER — Other Ambulatory Visit: Payer: Self-pay | Admitting: Nurse Practitioner

## 2023-07-09 ENCOUNTER — Ambulatory Visit
Admission: RE | Admit: 2023-07-09 | Discharge: 2023-07-09 | Disposition: A | Discharge status: Home / Self Care | Source: Ambulatory Visit | Attending: Internal Medicine | Admitting: Internal Medicine

## 2023-07-09 DIAGNOSIS — G893 Neoplasm related pain (acute) (chronic): Secondary | ICD-10-CM

## 2023-07-09 DIAGNOSIS — Z515 Encounter for palliative care: Secondary | ICD-10-CM

## 2023-07-09 DIAGNOSIS — C7931 Secondary malignant neoplasm of brain: Secondary | ICD-10-CM | POA: Diagnosis not present

## 2023-07-09 DIAGNOSIS — C3490 Malignant neoplasm of unspecified part of unspecified bronchus or lung: Secondary | ICD-10-CM

## 2023-07-09 DIAGNOSIS — C801 Malignant (primary) neoplasm, unspecified: Secondary | ICD-10-CM | POA: Diagnosis not present

## 2023-07-09 MED ORDER — GADOPICLENOL 0.5 MMOL/ML IV SOLN
7.5000 mL | Freq: Once | INTRAVENOUS | Status: AC | PRN
Start: 2023-07-09 — End: 2023-07-09
  Administered 2023-07-09: 7.5 mL via INTRAVENOUS

## 2023-07-10 ENCOUNTER — Ambulatory Visit: Payer: Self-pay | Admitting: Physician Assistant

## 2023-07-11 ENCOUNTER — Telehealth: Payer: Self-pay | Admitting: Internal Medicine

## 2023-07-11 ENCOUNTER — Telehealth: Payer: Self-pay | Admitting: *Deleted

## 2023-07-11 ENCOUNTER — Inpatient Hospital Stay: Payer: Self-pay | Attending: Internal Medicine | Admitting: Nurse Practitioner

## 2023-07-11 ENCOUNTER — Encounter: Payer: Self-pay | Admitting: Internal Medicine

## 2023-07-11 ENCOUNTER — Inpatient Hospital Stay: Payer: Self-pay | Admitting: Internal Medicine

## 2023-07-11 DIAGNOSIS — C801 Malignant (primary) neoplasm, unspecified: Secondary | ICD-10-CM | POA: Insufficient documentation

## 2023-07-11 DIAGNOSIS — C7951 Secondary malignant neoplasm of bone: Secondary | ICD-10-CM | POA: Insufficient documentation

## 2023-07-11 DIAGNOSIS — C7931 Secondary malignant neoplasm of brain: Secondary | ICD-10-CM | POA: Insufficient documentation

## 2023-07-11 NOTE — Telephone Encounter (Signed)
 Patient scheduled appointments. Patient is aware of all appointment details.

## 2023-07-11 NOTE — Telephone Encounter (Signed)
 PC to patient regarding missed appointment today, no answer, left VM - informed patient our scheduling department will contact him to reschedule, office number given to patient if he has questions/concerns.  Scheduling message sent.

## 2023-07-13 ENCOUNTER — Encounter: Payer: Self-pay | Admitting: Internal Medicine

## 2023-07-15 ENCOUNTER — Encounter

## 2023-07-15 ENCOUNTER — Inpatient Hospital Stay

## 2023-07-15 ENCOUNTER — Ambulatory Visit: Admitting: Nurse Practitioner

## 2023-07-16 ENCOUNTER — Other Ambulatory Visit: Payer: Self-pay | Admitting: *Deleted

## 2023-07-16 ENCOUNTER — Inpatient Hospital Stay (HOSPITAL_BASED_OUTPATIENT_CLINIC_OR_DEPARTMENT_OTHER): Payer: Self-pay | Admitting: Internal Medicine

## 2023-07-16 VITALS — BP 100/67 | HR 78 | Temp 98.6°F | Resp 16 | Ht 70.0 in | Wt 143.9 lb

## 2023-07-16 DIAGNOSIS — C801 Malignant (primary) neoplasm, unspecified: Secondary | ICD-10-CM | POA: Diagnosis present

## 2023-07-16 DIAGNOSIS — C7931 Secondary malignant neoplasm of brain: Secondary | ICD-10-CM

## 2023-07-16 DIAGNOSIS — C7951 Secondary malignant neoplasm of bone: Secondary | ICD-10-CM | POA: Diagnosis present

## 2023-07-16 MED ORDER — PROMETHAZINE HCL 25 MG PO TABS: 25.0000 mg | ORAL_TABLET | Freq: Four times a day (QID) | ORAL | 0 refills | Status: AC | PRN

## 2023-07-16 MED ORDER — DEXAMETHASONE 2 MG PO TABS: 1.0000 mg | ORAL_TABLET | Freq: Every day | ORAL | Status: DC

## 2023-07-16 MED ORDER — TRAZODONE HCL 50 MG PO TABS: 50.0000 mg | ORAL_TABLET | Freq: Every day | ORAL | 2 refills | Status: DC
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
## 2023-07-18 ENCOUNTER — Telehealth: Payer: Self-pay | Admitting: Internal Medicine

## 2023-07-18 NOTE — Telephone Encounter (Signed)
 Scheduled patient's appts. Advised patient to contact us if rescheduling is needed. Provided my direct line.

## 2023-07-23 ENCOUNTER — Other Ambulatory Visit: Payer: Self-pay | Admitting: Radiation Therapy

## 2023-07-24 ENCOUNTER — Other Ambulatory Visit: Payer: Self-pay | Admitting: Nurse Practitioner

## 2023-07-24 DIAGNOSIS — Z515 Encounter for palliative care: Secondary | ICD-10-CM

## 2023-07-24 DIAGNOSIS — C7931 Secondary malignant neoplasm of brain: Secondary | ICD-10-CM

## 2023-07-24 DIAGNOSIS — G893 Neoplasm related pain (acute) (chronic): Secondary | ICD-10-CM

## 2023-07-24 DIAGNOSIS — C3490 Malignant neoplasm of unspecified part of unspecified bronchus or lung: Secondary | ICD-10-CM

## 2023-08-03 ENCOUNTER — Other Ambulatory Visit: Payer: Self-pay | Admitting: Internal Medicine

## 2023-08-03 DIAGNOSIS — C7931 Secondary malignant neoplasm of brain: Secondary | ICD-10-CM

## 2023-08-06 ENCOUNTER — Encounter: Payer: Self-pay | Admitting: Internal Medicine

## 2023-08-12 ENCOUNTER — Other Ambulatory Visit: Payer: Self-pay

## 2023-08-12 DIAGNOSIS — C3490 Malignant neoplasm of unspecified part of unspecified bronchus or lung: Secondary | ICD-10-CM

## 2023-08-12 DIAGNOSIS — G893 Neoplasm related pain (acute) (chronic): Secondary | ICD-10-CM

## 2023-08-12 DIAGNOSIS — Z515 Encounter for palliative care: Secondary | ICD-10-CM

## 2023-08-12 DIAGNOSIS — C7931 Secondary malignant neoplasm of brain: Secondary | ICD-10-CM

## 2023-08-12 MED ORDER — OXYCODONE HCL 15 MG PO TABS: 15.0000 mg | ORAL_TABLET | ORAL | 0 refills | Status: DC | PRN

## 2023-08-12 NOTE — Telephone Encounter (Signed)
 Pt called for medication refill, see associated orders, pt also mailed copy of calendar with upcoming appts.

## 2023-08-23 ENCOUNTER — Inpatient Hospital Stay: Admitting: Nutrition

## 2023-08-23 ENCOUNTER — Encounter: Payer: Self-pay | Admitting: Nutrition

## 2023-08-23 NOTE — Progress Notes (Signed)
Patient cancelled nutrition appointment and did not reschedule. 

## 2023-08-25 ENCOUNTER — Other Ambulatory Visit: Payer: Self-pay | Admitting: Nurse Practitioner

## 2023-08-25 DIAGNOSIS — G893 Neoplasm related pain (acute) (chronic): Secondary | ICD-10-CM

## 2023-08-25 DIAGNOSIS — C3490 Malignant neoplasm of unspecified part of unspecified bronchus or lung: Secondary | ICD-10-CM

## 2023-08-25 DIAGNOSIS — Z515 Encounter for palliative care: Secondary | ICD-10-CM

## 2023-08-25 DIAGNOSIS — C7931 Secondary malignant neoplasm of brain: Secondary | ICD-10-CM

## 2023-08-26 ENCOUNTER — Telehealth: Payer: Self-pay

## 2023-08-26 ENCOUNTER — Encounter: Payer: Self-pay | Admitting: Internal Medicine

## 2023-08-26 ENCOUNTER — Other Ambulatory Visit: Payer: Self-pay

## 2023-08-26 NOTE — Telephone Encounter (Signed)
 Pt called for medication refill. See associated orders. Pt also requested to review upcoming appts, verbalized understanding. Pt also reporting ongoing, intermittent bilateral leg swelling for the last few months. Pt was scheduled to go see his PCP, appt was canceled, RN encouraged pt to f/u with PCP and r/s missed appt with them. Pt verbalized understanding.

## 2023-08-30 ENCOUNTER — Encounter (HOSPITAL_COMMUNITY): Payer: Self-pay

## 2023-08-30 ENCOUNTER — Ambulatory Visit (HOSPITAL_COMMUNITY)

## 2023-08-30 ENCOUNTER — Ambulatory Visit (HOSPITAL_COMMUNITY)
Admission: RE | Admit: 2023-08-30 | Discharge: 2023-08-30 | Disposition: A | Discharge status: Home / Self Care | Source: Ambulatory Visit | Attending: Internal Medicine | Admitting: Internal Medicine

## 2023-08-30 ENCOUNTER — Inpatient Hospital Stay: Payer: Medicaid Other | Attending: Internal Medicine

## 2023-08-30 DIAGNOSIS — C801 Malignant (primary) neoplasm, unspecified: Secondary | ICD-10-CM | POA: Insufficient documentation

## 2023-08-30 DIAGNOSIS — J432 Centrilobular emphysema: Secondary | ICD-10-CM | POA: Diagnosis not present

## 2023-08-30 DIAGNOSIS — Z79899 Other long term (current) drug therapy: Secondary | ICD-10-CM | POA: Insufficient documentation

## 2023-08-30 DIAGNOSIS — C3490 Malignant neoplasm of unspecified part of unspecified bronchus or lung: Secondary | ICD-10-CM

## 2023-08-30 DIAGNOSIS — K429 Umbilical hernia without obstruction or gangrene: Secondary | ICD-10-CM | POA: Diagnosis not present

## 2023-08-30 DIAGNOSIS — C7931 Secondary malignant neoplasm of brain: Secondary | ICD-10-CM | POA: Diagnosis present

## 2023-08-30 DIAGNOSIS — C7951 Secondary malignant neoplasm of bone: Secondary | ICD-10-CM | POA: Insufficient documentation

## 2023-08-30 DIAGNOSIS — Z85118 Personal history of other malignant neoplasm of bronchus and lung: Secondary | ICD-10-CM | POA: Diagnosis not present

## 2023-08-30 LAB — CBC WITH DIFFERENTIAL (CANCER CENTER ONLY)
Abs Immature Granulocytes: 0.21 10*3/uL — ABNORMAL HIGH (ref 0.00–0.07)
Basophils Absolute: 0 10*3/uL (ref 0.0–0.1)
Basophils Relative: 0 %
Eosinophils Absolute: 0 10*3/uL (ref 0.0–0.5)
Eosinophils Relative: 0 %
HCT: 34.3 % — ABNORMAL LOW (ref 39.0–52.0)
Hemoglobin: 11.1 g/dL — ABNORMAL LOW (ref 13.0–17.0)
Immature Granulocytes: 1 %
Lymphocytes Relative: 14 %
Lymphs Abs: 2.1 10*3/uL (ref 0.7–4.0)
MCH: 25 pg — ABNORMAL LOW (ref 26.0–34.0)
MCHC: 32.4 g/dL (ref 30.0–36.0)
MCV: 77.3 fL — ABNORMAL LOW (ref 80.0–100.0)
Monocytes Absolute: 0.8 10*3/uL (ref 0.1–1.0)
Monocytes Relative: 5 %
Neutro Abs: 12.2 10*3/uL — ABNORMAL HIGH (ref 1.7–7.7)
Neutrophils Relative %: 80 %
Platelet Count: 312 10*3/uL (ref 150–400)
RBC: 4.44 MIL/uL (ref 4.22–5.81)
RDW: 20.2 % — ABNORMAL HIGH (ref 11.5–15.5)
WBC Count: 15.3 10*3/uL — ABNORMAL HIGH (ref 4.0–10.5)
nRBC: 0 % (ref 0.0–0.2)

## 2023-08-30 LAB — CMP (CANCER CENTER ONLY)
ALT: 15 U/L (ref 0–44)
AST: 15 U/L (ref 15–41)
Albumin: 3.9 g/dL (ref 3.5–5.0)
Alkaline Phosphatase: 102 U/L (ref 38–126)
Anion gap: 11 (ref 5–15)
BUN: 16 mg/dL (ref 6–20)
CO2: 24 mmol/L (ref 22–32)
Calcium: 8.6 mg/dL — ABNORMAL LOW (ref 8.9–10.3)
Chloride: 103 mmol/L (ref 98–111)
Creatinine: 0.62 mg/dL (ref 0.61–1.24)
GFR, Estimated: >60 mL/min (ref >60)
Glucose, Bld: 143 mg/dL — ABNORMAL HIGH (ref 70–99)
Potassium: 3.4 mmol/L — ABNORMAL LOW (ref 3.5–5.1)
Sodium: 138 mmol/L (ref 135–145)
Total Bilirubin: 0.3 mg/dL (ref 0.0–1.2)
Total Protein: 7.4 g/dL (ref 6.5–8.1)

## 2023-09-09 ENCOUNTER — Encounter: Payer: Self-pay | Admitting: Nurse Practitioner

## 2023-09-09 ENCOUNTER — Inpatient Hospital Stay (HOSPITAL_BASED_OUTPATIENT_CLINIC_OR_DEPARTMENT_OTHER): Payer: Medicaid Other | Admitting: Internal Medicine

## 2023-09-09 ENCOUNTER — Other Ambulatory Visit: Payer: Self-pay | Admitting: Nurse Practitioner

## 2023-09-09 ENCOUNTER — Inpatient Hospital Stay (HOSPITAL_BASED_OUTPATIENT_CLINIC_OR_DEPARTMENT_OTHER): Admitting: Nurse Practitioner

## 2023-09-09 VITALS — BP 113/74 | HR 90 | Temp 98.2°F | Resp 17 | Ht 70.0 in | Wt 148.8 lb

## 2023-09-09 DIAGNOSIS — C3490 Malignant neoplasm of unspecified part of unspecified bronchus or lung: Secondary | ICD-10-CM

## 2023-09-09 DIAGNOSIS — Z515 Encounter for palliative care: Secondary | ICD-10-CM

## 2023-09-09 DIAGNOSIS — R530 Neoplastic (malignant) related fatigue: Secondary | ICD-10-CM

## 2023-09-09 DIAGNOSIS — C7931 Secondary malignant neoplasm of brain: Secondary | ICD-10-CM | POA: Diagnosis not present

## 2023-09-09 DIAGNOSIS — Z7189 Other specified counseling: Secondary | ICD-10-CM | POA: Diagnosis not present

## 2023-09-09 DIAGNOSIS — G893 Neoplasm related pain (acute) (chronic): Secondary | ICD-10-CM

## 2023-09-09 MED ORDER — DULOXETINE HCL 30 MG PO CPEP: 30.0000 mg | ORAL_CAPSULE | Freq: Every day | ORAL | 3 refills | Status: DC
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
## 2023-09-10 ENCOUNTER — Other Ambulatory Visit: Payer: Self-pay | Admitting: Nurse Practitioner

## 2023-09-10 ENCOUNTER — Encounter: Payer: Self-pay | Admitting: Internal Medicine

## 2023-09-10 ENCOUNTER — Telehealth: Payer: Self-pay

## 2023-09-10 ENCOUNTER — Telehealth: Payer: Self-pay | Admitting: Internal Medicine

## 2023-09-10 DIAGNOSIS — M62838 Other muscle spasm: Secondary | ICD-10-CM

## 2023-09-10 DIAGNOSIS — C7931 Secondary malignant neoplasm of brain: Secondary | ICD-10-CM

## 2023-09-10 NOTE — Telephone Encounter (Signed)
Scheduled appointments with the patient

## 2023-09-10 NOTE — Telephone Encounter (Signed)
 Pt called reporting that his medication was not available, this RN called the pharmacy and confirmed that the pt would be able to pick up his script today. Attempted to call the pt back, no answer, LVM.

## 2023-09-24 ENCOUNTER — Encounter (HOSPITAL_COMMUNITY): Admission: RE | Admit: 2023-09-24 | Source: Ambulatory Visit

## 2023-09-25 ENCOUNTER — Other Ambulatory Visit: Payer: Self-pay | Admitting: Nurse Practitioner

## 2023-09-25 DIAGNOSIS — C7931 Secondary malignant neoplasm of brain: Secondary | ICD-10-CM

## 2023-09-25 DIAGNOSIS — Z515 Encounter for palliative care: Secondary | ICD-10-CM

## 2023-09-25 DIAGNOSIS — C3490 Malignant neoplasm of unspecified part of unspecified bronchus or lung: Secondary | ICD-10-CM

## 2023-09-25 DIAGNOSIS — G893 Neoplasm related pain (acute) (chronic): Secondary | ICD-10-CM

## 2023-09-25 MED ORDER — OXYCODONE HCL 15 MG PO TABS: 15.0000 mg | ORAL_TABLET | ORAL | 0 refills | Status: DC | PRN

## 2023-10-01 ENCOUNTER — Inpatient Hospital Stay: Admitting: Internal Medicine

## 2023-10-04 ENCOUNTER — Encounter (HOSPITAL_COMMUNITY)
Admission: RE | Admit: 2023-10-04 | Discharge: 2023-10-04 | Disposition: A | Discharge status: Home / Self Care | Source: Ambulatory Visit | Attending: Internal Medicine | Admitting: Internal Medicine

## 2023-10-04 DIAGNOSIS — C3490 Malignant neoplasm of unspecified part of unspecified bronchus or lung: Secondary | ICD-10-CM | POA: Insufficient documentation

## 2023-10-07 ENCOUNTER — Inpatient Hospital Stay: Admitting: Nurse Practitioner

## 2023-10-08 ENCOUNTER — Encounter (HOSPITAL_COMMUNITY): Admission: RE | Admit: 2023-10-08 | Source: Ambulatory Visit

## 2023-10-08 ENCOUNTER — Encounter (HOSPITAL_COMMUNITY): Payer: Self-pay

## 2023-10-09 ENCOUNTER — Other Ambulatory Visit

## 2023-10-10 ENCOUNTER — Ambulatory Visit (HOSPITAL_COMMUNITY)

## 2023-10-10 ENCOUNTER — Telehealth: Payer: Self-pay | Admitting: Medical Oncology

## 2023-10-10 ENCOUNTER — Telehealth: Payer: Self-pay | Admitting: Internal Medicine

## 2023-10-10 ENCOUNTER — Inpatient Hospital Stay: Admitting: Nurse Practitioner

## 2023-10-10 ENCOUNTER — Inpatient Hospital Stay: Admitting: Internal Medicine

## 2023-10-10 ENCOUNTER — Telehealth: Payer: Self-pay | Admitting: Radiation Therapy

## 2023-10-10 NOTE — Telephone Encounter (Signed)
 Mr. Begay no showed his brain MRI and [facility] has not been able to reach him on the phone or leave a message to reschedule. I have cancelled the follow-up with Dr. Buckley and will reschedule once the scan is back on the books.   Devere Perch R.T(R)(T) Radiation Special Procedures Lead

## 2023-10-10 NOTE — Telephone Encounter (Signed)
 Pt did not show up for appts today and did not get his PET scan done yesterday. SABRA

## 2023-10-10 NOTE — Telephone Encounter (Signed)
 Brother will try to contact pt to call me.

## 2023-10-14 ENCOUNTER — Inpatient Hospital Stay

## 2023-10-14 ENCOUNTER — Other Ambulatory Visit: Payer: Self-pay | Admitting: Nurse Practitioner

## 2023-10-14 ENCOUNTER — Telehealth: Payer: Self-pay | Admitting: Internal Medicine

## 2023-10-14 DIAGNOSIS — Z515 Encounter for palliative care: Secondary | ICD-10-CM

## 2023-10-14 DIAGNOSIS — G893 Neoplasm related pain (acute) (chronic): Secondary | ICD-10-CM

## 2023-10-14 DIAGNOSIS — C3490 Malignant neoplasm of unspecified part of unspecified bronchus or lung: Secondary | ICD-10-CM

## 2023-10-14 DIAGNOSIS — C7931 Secondary malignant neoplasm of brain: Secondary | ICD-10-CM

## 2023-10-15 ENCOUNTER — Encounter

## 2023-10-15 ENCOUNTER — Inpatient Hospital Stay: Admitting: Internal Medicine

## 2023-10-16 ENCOUNTER — Ambulatory Visit
Admission: RE | Admit: 2023-10-16 | Discharge: 2023-10-16 | Disposition: A | Discharge status: Home / Self Care | Source: Ambulatory Visit | Attending: Internal Medicine

## 2023-10-16 DIAGNOSIS — C7931 Secondary malignant neoplasm of brain: Secondary | ICD-10-CM

## 2023-10-16 DIAGNOSIS — C719 Malignant neoplasm of brain, unspecified: Secondary | ICD-10-CM | POA: Diagnosis not present

## 2023-10-16 DIAGNOSIS — R519 Headache, unspecified: Secondary | ICD-10-CM | POA: Diagnosis not present

## 2023-10-16 MED ORDER — GADOPICLENOL 0.5 MMOL/ML IV SOLN
7.0000 mL | Freq: Once | INTRAVENOUS | Status: AC | PRN
  Administered 2023-10-16: 7 mL via INTRAVENOUS

## 2023-10-21 ENCOUNTER — Inpatient Hospital Stay: Attending: Internal Medicine

## 2023-10-21 ENCOUNTER — Encounter (HOSPITAL_COMMUNITY): Admission: RE | Admit: 2023-10-21 | Source: Ambulatory Visit

## 2023-10-21 ENCOUNTER — Encounter

## 2023-10-21 DIAGNOSIS — Z79899 Other long term (current) drug therapy: Secondary | ICD-10-CM | POA: Insufficient documentation

## 2023-10-21 DIAGNOSIS — C7951 Secondary malignant neoplasm of bone: Secondary | ICD-10-CM | POA: Insufficient documentation

## 2023-10-21 DIAGNOSIS — F17210 Nicotine dependence, cigarettes, uncomplicated: Secondary | ICD-10-CM | POA: Insufficient documentation

## 2023-10-21 DIAGNOSIS — Z803 Family history of malignant neoplasm of breast: Secondary | ICD-10-CM | POA: Insufficient documentation

## 2023-10-21 DIAGNOSIS — C801 Malignant (primary) neoplasm, unspecified: Secondary | ICD-10-CM | POA: Insufficient documentation

## 2023-10-21 DIAGNOSIS — Z800 Family history of malignant neoplasm of digestive organs: Secondary | ICD-10-CM | POA: Insufficient documentation

## 2023-10-21 DIAGNOSIS — C7931 Secondary malignant neoplasm of brain: Secondary | ICD-10-CM | POA: Insufficient documentation

## 2023-10-21 DIAGNOSIS — G894 Chronic pain syndrome: Secondary | ICD-10-CM | POA: Insufficient documentation

## 2023-10-22 ENCOUNTER — Telehealth: Payer: Self-pay | Admitting: *Deleted

## 2023-10-22 ENCOUNTER — Inpatient Hospital Stay: Admitting: Internal Medicine

## 2023-10-22 ENCOUNTER — Inpatient Hospital Stay: Admitting: Nurse Practitioner

## 2023-10-22 NOTE — Telephone Encounter (Signed)
 PC to patient regarding missed appointment this morning, he states he requested an appointment on the 14th. Informed patient our scheduling department will contact him to reschedule.  He verbalizes understanding, scheduling message sent.

## 2023-10-23 ENCOUNTER — Telehealth: Payer: Self-pay | Admitting: Internal Medicine

## 2023-10-23 NOTE — Telephone Encounter (Signed)
 Scheduled appointments per staff message. Called and left a VM with appointment details for the patient.

## 2023-10-24 ENCOUNTER — Encounter (HOSPITAL_COMMUNITY): Admission: RE | Admit: 2023-10-24 | Source: Ambulatory Visit

## 2023-10-24 ENCOUNTER — Telehealth: Payer: Self-pay | Admitting: *Deleted

## 2023-10-24 NOTE — Telephone Encounter (Signed)
 Patient LVM requesting to reschedule his appt with Dr. Buckley on 11/04/23. Message sent to scheduling to contact patient

## 2023-10-28 ENCOUNTER — Telehealth: Payer: Self-pay

## 2023-10-28 ENCOUNTER — Other Ambulatory Visit: Payer: Self-pay | Admitting: Internal Medicine

## 2023-10-28 ENCOUNTER — Telehealth: Payer: Self-pay | Admitting: Internal Medicine

## 2023-10-28 ENCOUNTER — Other Ambulatory Visit: Payer: Self-pay | Admitting: Nurse Practitioner

## 2023-10-28 DIAGNOSIS — G893 Neoplasm related pain (acute) (chronic): Secondary | ICD-10-CM

## 2023-10-28 DIAGNOSIS — M79604 Pain in right leg: Secondary | ICD-10-CM

## 2023-10-28 DIAGNOSIS — C3490 Malignant neoplasm of unspecified part of unspecified bronchus or lung: Secondary | ICD-10-CM

## 2023-10-28 DIAGNOSIS — C7931 Secondary malignant neoplasm of brain: Secondary | ICD-10-CM

## 2023-10-28 DIAGNOSIS — Z515 Encounter for palliative care: Secondary | ICD-10-CM

## 2023-10-28 NOTE — Telephone Encounter (Signed)
 Pt called for medication refill, pt no-showed last few palliative visits, pt was reminded fo pain contract and policy on missed appointments. New appointment scheduled, pt verbalized understanding and agreement

## 2023-10-28 NOTE — Telephone Encounter (Signed)
 Called to confirm if the patient wanted his 8/25 rescheduled or to stay scheduled. Talked with the patient and he states he wanted to stay scheduled for Monday 11/04/23. No appointments have been changed at this time.

## 2023-10-29 ENCOUNTER — Encounter: Payer: Self-pay | Admitting: Nurse Practitioner

## 2023-10-29 ENCOUNTER — Ambulatory Visit

## 2023-10-29 VITALS — BP 104/75 | HR 84 | Temp 98.0°F | Resp 17 | Ht 70.0 in | Wt 143.4 lb

## 2023-10-29 DIAGNOSIS — R530 Neoplastic (malignant) related fatigue: Secondary | ICD-10-CM | POA: Diagnosis not present

## 2023-10-29 DIAGNOSIS — C3490 Malignant neoplasm of unspecified part of unspecified bronchus or lung: Secondary | ICD-10-CM | POA: Diagnosis not present

## 2023-10-29 DIAGNOSIS — Z515 Encounter for palliative care: Secondary | ICD-10-CM | POA: Diagnosis not present

## 2023-10-29 DIAGNOSIS — C7931 Secondary malignant neoplasm of brain: Secondary | ICD-10-CM

## 2023-10-29 DIAGNOSIS — K5903 Drug induced constipation: Secondary | ICD-10-CM | POA: Diagnosis not present

## 2023-10-29 DIAGNOSIS — G893 Neoplasm related pain (acute) (chronic): Secondary | ICD-10-CM

## 2023-10-29 DIAGNOSIS — G4709 Other insomnia: Secondary | ICD-10-CM

## 2023-10-29 MED ORDER — TRAZODONE HCL 50 MG PO TABS: 50.0000 mg | ORAL_TABLET | Freq: Every day | ORAL | 2 refills | Status: DC

## 2023-10-29 MED ORDER — DULOXETINE HCL 30 MG PO CPEP: 30.0000 mg | ORAL_CAPSULE | Freq: Every day | ORAL | 3 refills | Status: AC

## 2023-10-29 MED ORDER — OXYCODONE HCL 15 MG PO TABS
15.0000 mg | ORAL_TABLET | ORAL | 0 refills | Status: DC | PRN
Start: 2023-10-29 — End: 2023-11-12
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
## 2023-10-31 ENCOUNTER — Telehealth: Payer: Self-pay | Admitting: Nurse Practitioner

## 2023-10-31 NOTE — Telephone Encounter (Signed)
 Canceled and scheduled appointment per 8/19 los. Talked with the patient and he is aware of the changes and made appointments.

## 2023-11-04 ENCOUNTER — Inpatient Hospital Stay (HOSPITAL_BASED_OUTPATIENT_CLINIC_OR_DEPARTMENT_OTHER): Admitting: Internal Medicine

## 2023-11-04 ENCOUNTER — Telehealth: Payer: Self-pay | Admitting: Internal Medicine

## 2023-11-04 ENCOUNTER — Encounter

## 2023-11-04 VITALS — BP 97/71 | HR 86 | Temp 98.1°F | Resp 20 | Wt 141.3 lb

## 2023-11-04 DIAGNOSIS — G4709 Other insomnia: Secondary | ICD-10-CM | POA: Diagnosis not present

## 2023-11-04 DIAGNOSIS — Z79899 Other long term (current) drug therapy: Secondary | ICD-10-CM | POA: Diagnosis not present

## 2023-11-04 DIAGNOSIS — C7951 Secondary malignant neoplasm of bone: Secondary | ICD-10-CM | POA: Diagnosis present

## 2023-11-04 DIAGNOSIS — Z803 Family history of malignant neoplasm of breast: Secondary | ICD-10-CM | POA: Diagnosis not present

## 2023-11-04 DIAGNOSIS — R569 Unspecified convulsions: Secondary | ICD-10-CM

## 2023-11-04 DIAGNOSIS — M79605 Pain in left leg: Secondary | ICD-10-CM

## 2023-11-04 DIAGNOSIS — M79604 Pain in right leg: Secondary | ICD-10-CM | POA: Diagnosis not present

## 2023-11-04 DIAGNOSIS — C7931 Secondary malignant neoplasm of brain: Secondary | ICD-10-CM | POA: Diagnosis not present

## 2023-11-04 DIAGNOSIS — Z800 Family history of malignant neoplasm of digestive organs: Secondary | ICD-10-CM | POA: Diagnosis not present

## 2023-11-04 DIAGNOSIS — F17210 Nicotine dependence, cigarettes, uncomplicated: Secondary | ICD-10-CM | POA: Diagnosis not present

## 2023-11-04 DIAGNOSIS — C801 Malignant (primary) neoplasm, unspecified: Secondary | ICD-10-CM | POA: Diagnosis present

## 2023-11-04 DIAGNOSIS — G894 Chronic pain syndrome: Secondary | ICD-10-CM | POA: Diagnosis not present

## 2023-11-04 MED ORDER — AMITRIPTYLINE HCL 100 MG PO TABS: 100.0000 mg | ORAL_TABLET | Freq: Every day | ORAL | 2 refills | Status: AC

## 2023-11-04 MED ORDER — TRAZODONE HCL 50 MG PO TABS: 50.0000 mg | ORAL_TABLET | Freq: Every day | ORAL | 5 refills | Status: AC
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
## 2023-11-04 NOTE — Telephone Encounter (Signed)
 Scheduled appointment per 8/25 los. Talked with the patient and he is aware of the made appointment.

## 2023-11-06 ENCOUNTER — Other Ambulatory Visit: Payer: Self-pay | Admitting: Radiation Therapy

## 2023-11-12 ENCOUNTER — Other Ambulatory Visit: Payer: Self-pay | Admitting: Nurse Practitioner

## 2023-11-12 DIAGNOSIS — C7931 Secondary malignant neoplasm of brain: Secondary | ICD-10-CM

## 2023-11-12 DIAGNOSIS — C3490 Malignant neoplasm of unspecified part of unspecified bronchus or lung: Secondary | ICD-10-CM

## 2023-11-12 DIAGNOSIS — G893 Neoplasm related pain (acute) (chronic): Secondary | ICD-10-CM

## 2023-11-12 DIAGNOSIS — Z515 Encounter for palliative care: Secondary | ICD-10-CM

## 2023-11-18 ENCOUNTER — Telehealth: Payer: Self-pay

## 2023-11-18 DIAGNOSIS — G893 Neoplasm related pain (acute) (chronic): Secondary | ICD-10-CM

## 2023-11-18 NOTE — Progress Notes (Unsigned)
 Complex Care Management Note Care Guide Note  11/18/2023 Name: Eric Mason MRN: [id number] DOB: 28-Aug-1970   Complex Care Management Outreach Attempts: An unsuccessful telephone outreach was attempted today to offer the patient information about available complex care management services.  Follow Up Plan:  Additional outreach attempts will be made to offer the patient complex care management information and services.   Encounter Outcome:  No Answer  Dreama [facility]  [facility], [facility] VBCI Assistant Direct Dial: [phone]  Fax: [phone]

## 2023-11-19 NOTE — Progress Notes (Unsigned)
 Complex Care Management Note Care Guide Note  11/19/2023 Name: Eric Mason MRN: [id number] DOB: Jun 10, 1970   Complex Care Management Outreach Attempts: A second unsuccessful outreach was attempted today to offer the patient with information about available complex care management services.  Follow Up Plan:  Additional outreach attempts will be made to offer the patient complex care management information and services.   Encounter Outcome:  No Answer  Dreama [facility]  [facility], [facility] VBCI Assistant Direct Dial: [phone]  Fax: [phone]

## 2023-11-20 NOTE — Progress Notes (Signed)
 Complex Care Management Note Care Guide Note  11/20/2023 Name: Eric Mason MRN: [id number] DOB: 15-Nov-1970   Complex Care Management Outreach Attempts: A third unsuccessful outreach was attempted today to offer the patient with information about available complex care management services.  Follow Up Plan:  No further outreach attempts will be made at this time. We have been unable to contact the patient to offer or enroll patient in complex care management services.  Encounter Outcome:  No Answer  Dreama [facility]  [facility], [facility] VBCI Assistant Direct Dial: [phone]  Fax: [phone]

## 2023-11-26 ENCOUNTER — Other Ambulatory Visit: Payer: Self-pay

## 2023-11-26 DIAGNOSIS — C7931 Secondary malignant neoplasm of brain: Secondary | ICD-10-CM

## 2023-11-26 DIAGNOSIS — C3490 Malignant neoplasm of unspecified part of unspecified bronchus or lung: Secondary | ICD-10-CM

## 2023-11-26 DIAGNOSIS — G893 Neoplasm related pain (acute) (chronic): Secondary | ICD-10-CM

## 2023-11-26 DIAGNOSIS — Z515 Encounter for palliative care: Secondary | ICD-10-CM

## 2023-11-26 MED ORDER — OXYCODONE HCL 15 MG PO TABS: 15.0000 mg | ORAL_TABLET | ORAL | 0 refills | Status: DC | PRN

## 2023-12-10 ENCOUNTER — Encounter: Payer: Self-pay | Admitting: Nurse Practitioner

## 2023-12-10 ENCOUNTER — Inpatient Hospital Stay: Attending: Internal Medicine | Admitting: Nurse Practitioner

## 2023-12-10 ENCOUNTER — Other Ambulatory Visit: Payer: Self-pay | Admitting: Nurse Practitioner

## 2023-12-10 DIAGNOSIS — G893 Neoplasm related pain (acute) (chronic): Secondary | ICD-10-CM | POA: Diagnosis not present

## 2023-12-10 DIAGNOSIS — K5903 Drug induced constipation: Secondary | ICD-10-CM

## 2023-12-10 DIAGNOSIS — C7951 Secondary malignant neoplasm of bone: Secondary | ICD-10-CM | POA: Diagnosis not present

## 2023-12-10 DIAGNOSIS — Z515 Encounter for palliative care: Secondary | ICD-10-CM

## 2023-12-10 DIAGNOSIS — C7931 Secondary malignant neoplasm of brain: Secondary | ICD-10-CM

## 2023-12-10 DIAGNOSIS — C3490 Malignant neoplasm of unspecified part of unspecified bronchus or lung: Secondary | ICD-10-CM
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
## 2023-12-11 ENCOUNTER — Telehealth: Payer: Self-pay | Admitting: Nurse Practitioner

## 2023-12-11 NOTE — Telephone Encounter (Signed)
 Scheduled patient for next appointment. Called and spoke with him, he is aware.

## 2023-12-24 ENCOUNTER — Other Ambulatory Visit: Payer: Self-pay | Admitting: Nurse Practitioner

## 2023-12-24 DIAGNOSIS — C7931 Secondary malignant neoplasm of brain: Secondary | ICD-10-CM

## 2023-12-24 DIAGNOSIS — C3490 Malignant neoplasm of unspecified part of unspecified bronchus or lung: Secondary | ICD-10-CM

## 2023-12-24 DIAGNOSIS — Z515 Encounter for palliative care: Secondary | ICD-10-CM

## 2023-12-24 DIAGNOSIS — G893 Neoplasm related pain (acute) (chronic): Secondary | ICD-10-CM

## 2024-01-04 ENCOUNTER — Other Ambulatory Visit: Payer: Self-pay | Admitting: Internal Medicine

## 2024-01-06 ENCOUNTER — Encounter: Payer: Self-pay | Admitting: Internal Medicine

## 2024-01-07 ENCOUNTER — Other Ambulatory Visit: Payer: Self-pay

## 2024-01-07 ENCOUNTER — Other Ambulatory Visit: Payer: Self-pay | Admitting: Nurse Practitioner

## 2024-01-07 DIAGNOSIS — C7931 Secondary malignant neoplasm of brain: Secondary | ICD-10-CM

## 2024-01-07 DIAGNOSIS — G893 Neoplasm related pain (acute) (chronic): Secondary | ICD-10-CM

## 2024-01-07 DIAGNOSIS — Z515 Encounter for palliative care: Secondary | ICD-10-CM

## 2024-01-07 DIAGNOSIS — C3490 Malignant neoplasm of unspecified part of unspecified bronchus or lung: Secondary | ICD-10-CM

## 2024-01-21 ENCOUNTER — Encounter: Payer: Self-pay | Admitting: Nurse Practitioner

## 2024-01-21 ENCOUNTER — Inpatient Hospital Stay: Attending: Internal Medicine | Admitting: Nurse Practitioner

## 2024-01-21 VITALS — BP 106/73 | HR 80 | Temp 98.1°F | Resp 16 | Ht 70.0 in | Wt 137.0 lb

## 2024-01-21 DIAGNOSIS — Z515 Encounter for palliative care: Secondary | ICD-10-CM

## 2024-01-21 DIAGNOSIS — C7931 Secondary malignant neoplasm of brain: Secondary | ICD-10-CM | POA: Diagnosis not present

## 2024-01-21 DIAGNOSIS — G893 Neoplasm related pain (acute) (chronic): Secondary | ICD-10-CM | POA: Diagnosis not present

## 2024-01-21 DIAGNOSIS — C3490 Malignant neoplasm of unspecified part of unspecified bronchus or lung: Secondary | ICD-10-CM

## 2024-01-21 DIAGNOSIS — R530 Neoplastic (malignant) related fatigue: Secondary | ICD-10-CM | POA: Diagnosis not present

## 2024-01-21 DIAGNOSIS — M62838 Other muscle spasm: Secondary | ICD-10-CM

## 2024-01-21 DIAGNOSIS — K5903 Drug induced constipation: Secondary | ICD-10-CM

## 2024-01-21 MED ORDER — OXYCODONE HCL 15 MG PO TABS: 15.0000 mg | ORAL_TABLET | ORAL | 0 refills | Status: DC | PRN

## 2024-01-21 MED ORDER — BACLOFEN 10 MG PO TABS: 10.0000 mg | ORAL_TABLET | Freq: Three times a day (TID) | ORAL | 5 refills | Status: DC
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
## 2024-01-30 ENCOUNTER — Other Ambulatory Visit: Payer: Self-pay | Admitting: Internal Medicine

## 2024-01-30 DIAGNOSIS — C7931 Secondary malignant neoplasm of brain: Secondary | ICD-10-CM

## 2024-02-03 ENCOUNTER — Other Ambulatory Visit: Payer: Self-pay | Admitting: Nurse Practitioner

## 2024-02-03 DIAGNOSIS — C7931 Secondary malignant neoplasm of brain: Secondary | ICD-10-CM

## 2024-02-03 DIAGNOSIS — G893 Neoplasm related pain (acute) (chronic): Secondary | ICD-10-CM

## 2024-02-03 DIAGNOSIS — Z515 Encounter for palliative care: Secondary | ICD-10-CM

## 2024-02-03 DIAGNOSIS — C3490 Malignant neoplasm of unspecified part of unspecified bronchus or lung: Secondary | ICD-10-CM

## 2024-02-03 MED ORDER — OXYCODONE HCL 15 MG PO TABS: 15.0000 mg | ORAL_TABLET | ORAL | 0 refills | Status: DC | PRN

## 2024-02-15 ENCOUNTER — Other Ambulatory Visit: Payer: Self-pay | Admitting: Internal Medicine

## 2024-02-15 DIAGNOSIS — C7931 Secondary malignant neoplasm of brain: Secondary | ICD-10-CM

## 2024-02-17 ENCOUNTER — Encounter: Payer: Self-pay | Admitting: Internal Medicine

## 2024-02-18 ENCOUNTER — Other Ambulatory Visit: Payer: Self-pay | Admitting: Nurse Practitioner

## 2024-02-18 DIAGNOSIS — C7931 Secondary malignant neoplasm of brain: Secondary | ICD-10-CM

## 2024-02-18 DIAGNOSIS — Z515 Encounter for palliative care: Secondary | ICD-10-CM

## 2024-02-18 DIAGNOSIS — G893 Neoplasm related pain (acute) (chronic): Secondary | ICD-10-CM

## 2024-02-18 DIAGNOSIS — C3490 Malignant neoplasm of unspecified part of unspecified bronchus or lung: Secondary | ICD-10-CM

## 2024-02-24 ENCOUNTER — Encounter: Payer: Self-pay | Admitting: Nurse Practitioner

## 2024-02-24 ENCOUNTER — Inpatient Hospital Stay: Attending: Internal Medicine | Admitting: Nurse Practitioner

## 2024-02-24 DIAGNOSIS — K5903 Drug induced constipation: Secondary | ICD-10-CM

## 2024-02-24 DIAGNOSIS — G893 Neoplasm related pain (acute) (chronic): Secondary | ICD-10-CM

## 2024-02-24 DIAGNOSIS — M62838 Other muscle spasm: Secondary | ICD-10-CM

## 2024-02-24 DIAGNOSIS — M1990 Unspecified osteoarthritis, unspecified site: Secondary | ICD-10-CM

## 2024-02-24 DIAGNOSIS — C3490 Malignant neoplasm of unspecified part of unspecified bronchus or lung: Secondary | ICD-10-CM

## 2024-02-24 DIAGNOSIS — F17210 Nicotine dependence, cigarettes, uncomplicated: Secondary | ICD-10-CM

## 2024-02-24 DIAGNOSIS — Z515 Encounter for palliative care: Secondary | ICD-10-CM | POA: Diagnosis not present

## 2024-02-24 DIAGNOSIS — C7931 Secondary malignant neoplasm of brain: Secondary | ICD-10-CM

## 2024-02-24 DIAGNOSIS — M792 Neuralgia and neuritis, unspecified: Secondary | ICD-10-CM

## 2024-02-25 ENCOUNTER — Encounter: Payer: Self-pay | Admitting: Internal Medicine
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
## 2024-03-03 ENCOUNTER — Other Ambulatory Visit: Payer: Self-pay | Admitting: Nurse Practitioner

## 2024-03-03 ENCOUNTER — Telehealth: Payer: Self-pay

## 2024-03-03 DIAGNOSIS — C3490 Malignant neoplasm of unspecified part of unspecified bronchus or lung: Secondary | ICD-10-CM

## 2024-03-03 DIAGNOSIS — G893 Neoplasm related pain (acute) (chronic): Secondary | ICD-10-CM

## 2024-03-03 DIAGNOSIS — C7931 Secondary malignant neoplasm of brain: Secondary | ICD-10-CM

## 2024-03-03 DIAGNOSIS — Z515 Encounter for palliative care: Secondary | ICD-10-CM

## 2024-03-03 NOTE — Telephone Encounter (Signed)
 Pt called for refill, see associated orders.

## 2024-03-16 ENCOUNTER — Telehealth: Payer: Self-pay | Admitting: *Deleted

## 2024-03-16 NOTE — Telephone Encounter (Signed)
 Received PC from patient, he states he has been having dizzy spells at night, he states he wakes up walking around like he is sleep walking.  This has happened the last several nights.  He thinks it may be from taking his trazadone & amitriptyline  together as instructed by Dr Buckley.  Dr Buckley informed of patient complaint, he recommends patient hold his amitriptyline  for the next several nights & see if this makes a difference.  He can see patient sooner than his February appointment if needed.  Patient informed of Dr Eward recommendation, he verbalizes understanding.

## 2024-03-17 ENCOUNTER — Other Ambulatory Visit: Payer: Self-pay

## 2024-03-17 ENCOUNTER — Other Ambulatory Visit: Payer: Self-pay | Admitting: Internal Medicine

## 2024-03-17 ENCOUNTER — Telehealth: Payer: Self-pay | Admitting: *Deleted

## 2024-03-17 DIAGNOSIS — G893 Neoplasm related pain (acute) (chronic): Secondary | ICD-10-CM

## 2024-03-17 DIAGNOSIS — Z515 Encounter for palliative care: Secondary | ICD-10-CM

## 2024-03-17 DIAGNOSIS — C3490 Malignant neoplasm of unspecified part of unspecified bronchus or lung: Secondary | ICD-10-CM

## 2024-03-17 DIAGNOSIS — C7931 Secondary malignant neoplasm of brain: Secondary | ICD-10-CM

## 2024-03-17 MED ORDER — OXYCODONE HCL 15 MG PO TABS: 15.0000 mg | ORAL_TABLET | ORAL | 0 refills | Status: DC | PRN

## 2024-03-17 MED ORDER — DEXAMETHASONE 1 MG PO TABS: 1.0000 mg | ORAL_TABLET | Freq: Every day | ORAL | 1 refills | Status: AC

## 2024-03-17 NOTE — Telephone Encounter (Signed)
 Contacted by Mr. Ciszek with request for refill of Dexamethasone  4mg .  Refill request given to  Dr.Vaslow.  Dr. Buckley sent RX for Dexamethasone  1 mg daily. He asked that patient be informed of dose change and advised that Dr. Buckley will discuss medication with him at appt 2/24. He verbalized understanding.

## 2024-03-31 ENCOUNTER — Other Ambulatory Visit: Payer: Self-pay

## 2024-03-31 ENCOUNTER — Other Ambulatory Visit: Payer: Self-pay | Admitting: Nurse Practitioner

## 2024-03-31 DIAGNOSIS — G893 Neoplasm related pain (acute) (chronic): Secondary | ICD-10-CM

## 2024-03-31 DIAGNOSIS — C7931 Secondary malignant neoplasm of brain: Secondary | ICD-10-CM

## 2024-03-31 DIAGNOSIS — M62838 Other muscle spasm: Secondary | ICD-10-CM

## 2024-03-31 DIAGNOSIS — Z515 Encounter for palliative care: Secondary | ICD-10-CM

## 2024-03-31 DIAGNOSIS — C3490 Malignant neoplasm of unspecified part of unspecified bronchus or lung: Secondary | ICD-10-CM

## 2024-03-31 MED ORDER — BACLOFEN 10 MG PO TABS: 10.0000 mg | ORAL_TABLET | Freq: Three times a day (TID) | ORAL | 3 refills | Status: AC

## 2024-03-31 MED ORDER — OXYCODONE HCL 15 MG PO TABS: 15.0000 mg | ORAL_TABLET | ORAL | 0 refills | Status: DC | PRN

## 2024-04-02 ENCOUNTER — Inpatient Hospital Stay: Admitting: Nurse Practitioner

## 2024-04-08 ENCOUNTER — Encounter: Payer: Self-pay | Admitting: Nurse Practitioner

## 2024-04-08 ENCOUNTER — Encounter: Payer: Self-pay | Admitting: Internal Medicine

## 2024-04-08 ENCOUNTER — Inpatient Hospital Stay: Attending: Internal Medicine | Admitting: Nurse Practitioner

## 2024-04-08 DIAGNOSIS — C3490 Malignant neoplasm of unspecified part of unspecified bronchus or lung: Secondary | ICD-10-CM | POA: Diagnosis not present

## 2024-04-08 DIAGNOSIS — G893 Neoplasm related pain (acute) (chronic): Secondary | ICD-10-CM

## 2024-04-08 DIAGNOSIS — F17210 Nicotine dependence, cigarettes, uncomplicated: Secondary | ICD-10-CM | POA: Diagnosis not present

## 2024-04-08 DIAGNOSIS — Z515 Encounter for palliative care: Secondary | ICD-10-CM | POA: Diagnosis not present

## 2024-04-08 DIAGNOSIS — C7931 Secondary malignant neoplasm of brain: Secondary | ICD-10-CM

## 2024-04-08 DIAGNOSIS — K5900 Constipation, unspecified: Secondary | ICD-10-CM

## 2024-04-08 DIAGNOSIS — C7951 Secondary malignant neoplasm of bone: Secondary | ICD-10-CM

## 2024-04-08 MED ORDER — OXYCODONE HCL 15 MG PO TABS: 15.0000 mg | ORAL_TABLET | ORAL | 0 refills | Status: AC | PRN

## 2024-04-08 MED ORDER — OXYCODONE HCL ER 20 MG PO T12A: 20.0000 mg | EXTENDED_RELEASE_TABLET | Freq: Two times a day (BID) | ORAL | 0 refills | Status: AC
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
## 2024-04-08 NOTE — Progress Notes (Signed)
 Erroneous

## 2024-04-30 ENCOUNTER — Other Ambulatory Visit

## 2024-05-04 ENCOUNTER — Encounter

## 2024-05-05 ENCOUNTER — Inpatient Hospital Stay

## 2024-05-05 ENCOUNTER — Ambulatory Visit: Admitting: Internal Medicine
